# Patient Record
Sex: Male | Born: 1950
Health system: Southern US, Community
[De-identification: ages and names within clinical notes are randomized; demographics above are authoritative.]

## PROBLEM LIST (undated history)

## (undated) DIAGNOSIS — G4730 Sleep apnea, unspecified: Secondary | ICD-10-CM

## (undated) DIAGNOSIS — I219 Acute myocardial infarction, unspecified: Secondary | ICD-10-CM

## (undated) DIAGNOSIS — F419 Anxiety disorder, unspecified: Secondary | ICD-10-CM

## (undated) DIAGNOSIS — I10 Essential (primary) hypertension: Secondary | ICD-10-CM

## (undated) DIAGNOSIS — E785 Hyperlipidemia, unspecified: Secondary | ICD-10-CM

## (undated) DIAGNOSIS — K219 Gastro-esophageal reflux disease without esophagitis: Secondary | ICD-10-CM

## (undated) DIAGNOSIS — Z9289 Personal history of other medical treatment: Secondary | ICD-10-CM

## (undated) DIAGNOSIS — M87059 Idiopathic aseptic necrosis of unspecified femur: Secondary | ICD-10-CM

## (undated) DIAGNOSIS — M1990 Unspecified osteoarthritis, unspecified site: Secondary | ICD-10-CM

## (undated) DIAGNOSIS — E079 Disorder of thyroid, unspecified: Secondary | ICD-10-CM

## (undated) DIAGNOSIS — R202 Paresthesia of skin: Secondary | ICD-10-CM

## (undated) DIAGNOSIS — I2510 Atherosclerotic heart disease of native coronary artery without angina pectoris: Secondary | ICD-10-CM

## (undated) DIAGNOSIS — T7840XA Allergy, unspecified, initial encounter: Secondary | ICD-10-CM

## (undated) HISTORY — PX: TONSILLECTOMY: SUR1361

## (undated) HISTORY — DX: Allergy, unspecified, initial encounter: T78.40XA

## (undated) HISTORY — PX: CARDIAC CATHETERIZATION: SHX172

## (undated) HISTORY — DX: Hyperlipidemia, unspecified: E78.5

## (undated) HISTORY — DX: Paresthesia of skin: R20.2

## (undated) HISTORY — DX: Essential (primary) hypertension: I10

## (undated) HISTORY — DX: Sleep apnea, unspecified: G47.30

## (undated) HISTORY — DX: Atherosclerotic heart disease of native coronary artery without angina pectoris: I25.10

## (undated) HISTORY — PX: COLONOSCOPY: SHX174

## (undated) HISTORY — DX: Idiopathic aseptic necrosis of unspecified femur: M87.059

## (undated) HISTORY — PX: TOTAL HIP ARTHROPLASTY: SHX124

## (undated) HISTORY — PX: UPPER GASTROINTESTINAL ENDOSCOPY: SHX188

## (undated) HISTORY — DX: Anxiety disorder, unspecified: F41.9

## (undated) HISTORY — DX: Personal history of other medical treatment: Z92.89

## (undated) HISTORY — PX: ROTATOR CUFF REPAIR: SHX139

## (undated) HISTORY — DX: Disorder of thyroid, unspecified: E07.9

---

## 2002-01-29 ENCOUNTER — Encounter: Payer: Self-pay | Admitting: Family Medicine

## 2002-01-29 ENCOUNTER — Ambulatory Visit (HOSPITAL_COMMUNITY): Admission: RE | Admit: 2002-01-29 | Discharge: 2002-01-29 | Payer: Self-pay | Admitting: Family Medicine

## 2004-07-27 ENCOUNTER — Ambulatory Visit (HOSPITAL_COMMUNITY): Admission: RE | Admit: 2004-07-27 | Discharge: 2004-07-27 | Payer: Self-pay | Admitting: Family Medicine

## 2004-11-16 ENCOUNTER — Ambulatory Visit (HOSPITAL_BASED_OUTPATIENT_CLINIC_OR_DEPARTMENT_OTHER): Admission: RE | Admit: 2004-11-16 | Discharge: 2004-11-16 | Payer: Self-pay | Admitting: Orthopedic Surgery

## 2004-11-16 ENCOUNTER — Encounter (INDEPENDENT_AMBULATORY_CARE_PROVIDER_SITE_OTHER): Payer: Self-pay | Admitting: Specialist

## 2004-11-16 ENCOUNTER — Ambulatory Visit (HOSPITAL_COMMUNITY): Admission: RE | Admit: 2004-11-16 | Discharge: 2004-11-16 | Payer: Self-pay | Admitting: Orthopedic Surgery

## 2005-03-28 ENCOUNTER — Ambulatory Visit (HOSPITAL_COMMUNITY): Admission: RE | Admit: 2005-03-28 | Discharge: 2005-03-28 | Payer: Self-pay | Admitting: Family Medicine

## 2005-12-10 ENCOUNTER — Ambulatory Visit: Payer: Self-pay | Admitting: Internal Medicine

## 2007-01-14 ENCOUNTER — Emergency Department (HOSPITAL_COMMUNITY): Admission: EM | Admit: 2007-01-14 | Discharge: 2007-01-14 | Payer: Self-pay | Admitting: Emergency Medicine

## 2007-08-19 ENCOUNTER — Inpatient Hospital Stay (HOSPITAL_COMMUNITY): Admission: EM | Admit: 2007-08-19 | Discharge: 2007-08-21 | Payer: Self-pay | Admitting: Emergency Medicine

## 2007-08-19 ENCOUNTER — Ambulatory Visit: Payer: Self-pay | Admitting: Cardiovascular Disease

## 2007-08-25 ENCOUNTER — Ambulatory Visit: Payer: Self-pay | Admitting: Internal Medicine

## 2007-08-28 ENCOUNTER — Inpatient Hospital Stay (HOSPITAL_COMMUNITY): Admission: EM | Admit: 2007-08-28 | Discharge: 2007-09-02 | Payer: Self-pay | Admitting: Emergency Medicine

## 2007-08-28 ENCOUNTER — Ambulatory Visit: Payer: Self-pay | Admitting: Internal Medicine

## 2007-09-05 ENCOUNTER — Ambulatory Visit: Payer: Self-pay | Admitting: Internal Medicine

## 2007-09-08 ENCOUNTER — Ambulatory Visit: Payer: Self-pay | Admitting: Internal Medicine

## 2007-09-22 ENCOUNTER — Ambulatory Visit: Payer: Self-pay | Admitting: Cardiology

## 2007-09-26 ENCOUNTER — Ambulatory Visit: Payer: Self-pay | Admitting: Internal Medicine

## 2007-10-13 ENCOUNTER — Ambulatory Visit: Payer: Self-pay | Admitting: Internal Medicine

## 2007-10-21 ENCOUNTER — Ambulatory Visit: Payer: Self-pay | Admitting: Cardiology

## 2007-10-23 ENCOUNTER — Ambulatory Visit: Payer: Self-pay | Admitting: Internal Medicine

## 2007-10-28 ENCOUNTER — Encounter (HOSPITAL_COMMUNITY): Admission: RE | Admit: 2007-10-28 | Discharge: 2007-11-27 | Payer: Self-pay | Admitting: Internal Medicine

## 2007-10-31 ENCOUNTER — Ambulatory Visit: Payer: Self-pay | Admitting: Internal Medicine

## 2007-11-06 ENCOUNTER — Ambulatory Visit: Payer: Self-pay | Admitting: Cardiology

## 2007-11-12 ENCOUNTER — Ambulatory Visit: Payer: Self-pay | Admitting: Cardiovascular Disease

## 2007-11-14 ENCOUNTER — Ambulatory Visit: Payer: Self-pay | Admitting: Internal Medicine

## 2007-11-28 ENCOUNTER — Encounter (HOSPITAL_COMMUNITY): Admission: RE | Admit: 2007-11-28 | Discharge: 2007-12-28 | Payer: Self-pay | Admitting: Internal Medicine

## 2007-12-01 ENCOUNTER — Ambulatory Visit: Payer: Self-pay | Admitting: Internal Medicine

## 2007-12-17 ENCOUNTER — Ambulatory Visit: Payer: Self-pay | Admitting: Cardiology

## 2007-12-22 ENCOUNTER — Ambulatory Visit: Payer: Self-pay | Admitting: Internal Medicine

## 2007-12-29 ENCOUNTER — Encounter (HOSPITAL_COMMUNITY): Admission: RE | Admit: 2007-12-29 | Discharge: 2007-12-31 | Payer: Self-pay | Admitting: Internal Medicine

## 2007-12-30 ENCOUNTER — Inpatient Hospital Stay (HOSPITAL_BASED_OUTPATIENT_CLINIC_OR_DEPARTMENT_OTHER): Admission: RE | Admit: 2007-12-30 | Discharge: 2007-12-30 | Payer: Self-pay | Admitting: Cardiology

## 2007-12-30 ENCOUNTER — Ambulatory Visit: Payer: Self-pay | Admitting: Cardiology

## 2008-01-08 ENCOUNTER — Ambulatory Visit: Payer: Self-pay | Admitting: Cardiology

## 2008-01-09 ENCOUNTER — Emergency Department (HOSPITAL_COMMUNITY): Admission: EM | Admit: 2008-01-09 | Discharge: 2008-01-09 | Payer: Self-pay | Admitting: Emergency Medicine

## 2008-01-09 ENCOUNTER — Ambulatory Visit: Payer: Self-pay | Admitting: Internal Medicine

## 2008-01-09 DIAGNOSIS — L270 Generalized skin eruption due to drugs and medicaments taken internally: Secondary | ICD-10-CM | POA: Insufficient documentation

## 2008-01-09 DIAGNOSIS — R229 Localized swelling, mass and lump, unspecified: Secondary | ICD-10-CM

## 2008-01-12 ENCOUNTER — Encounter: Payer: Self-pay | Admitting: Internal Medicine

## 2008-01-27 ENCOUNTER — Inpatient Hospital Stay (HOSPITAL_COMMUNITY): Admission: AD | Admit: 2008-01-27 | Discharge: 2008-01-28 | Payer: Self-pay | Admitting: Cardiology

## 2008-01-27 ENCOUNTER — Ambulatory Visit: Payer: Self-pay | Admitting: Cardiology

## 2008-02-03 ENCOUNTER — Emergency Department (HOSPITAL_COMMUNITY): Admission: EM | Admit: 2008-02-03 | Discharge: 2008-02-03 | Payer: Self-pay | Admitting: Emergency Medicine

## 2008-02-09 ENCOUNTER — Ambulatory Visit: Payer: Self-pay | Admitting: Internal Medicine

## 2008-02-11 ENCOUNTER — Encounter: Payer: Self-pay | Admitting: Internal Medicine

## 2008-05-07 ENCOUNTER — Ambulatory Visit: Payer: Self-pay | Admitting: Internal Medicine

## 2008-06-24 ENCOUNTER — Ambulatory Visit: Payer: Self-pay | Admitting: Internal Medicine

## 2008-07-13 ENCOUNTER — Encounter: Payer: Self-pay | Admitting: Internal Medicine

## 2008-07-13 ENCOUNTER — Emergency Department (HOSPITAL_COMMUNITY): Admission: EM | Admit: 2008-07-13 | Discharge: 2008-07-13 | Payer: Self-pay | Admitting: Emergency Medicine

## 2008-08-26 ENCOUNTER — Ambulatory Visit: Payer: Self-pay | Admitting: Cardiology

## 2008-11-17 ENCOUNTER — Ambulatory Visit: Payer: Self-pay | Admitting: Cardiology

## 2008-11-17 ENCOUNTER — Encounter: Payer: Self-pay | Admitting: Physician Assistant

## 2008-11-17 ENCOUNTER — Encounter: Payer: Self-pay | Admitting: Internal Medicine

## 2008-11-24 ENCOUNTER — Ambulatory Visit: Payer: Self-pay | Admitting: Cardiology

## 2009-01-14 ENCOUNTER — Encounter: Payer: Self-pay | Admitting: Cardiology

## 2009-02-14 ENCOUNTER — Ambulatory Visit: Payer: Self-pay | Admitting: Cardiology

## 2009-02-21 ENCOUNTER — Ambulatory Visit (HOSPITAL_COMMUNITY): Admission: RE | Admit: 2009-02-21 | Discharge: 2009-02-21 | Payer: Self-pay | Admitting: Cardiology

## 2009-04-22 ENCOUNTER — Encounter: Payer: Self-pay | Admitting: Cardiology

## 2009-05-31 ENCOUNTER — Telehealth: Payer: Self-pay | Admitting: Cardiology

## 2009-06-11 DIAGNOSIS — E039 Hypothyroidism, unspecified: Secondary | ICD-10-CM | POA: Insufficient documentation

## 2009-06-15 ENCOUNTER — Encounter: Payer: Self-pay | Admitting: Cardiology

## 2009-07-05 ENCOUNTER — Encounter (INDEPENDENT_AMBULATORY_CARE_PROVIDER_SITE_OTHER): Payer: Self-pay | Admitting: *Deleted

## 2009-08-12 ENCOUNTER — Ambulatory Visit: Payer: Self-pay | Admitting: Internal Medicine

## 2009-08-12 DIAGNOSIS — I2510 Atherosclerotic heart disease of native coronary artery without angina pectoris: Secondary | ICD-10-CM

## 2009-08-12 DIAGNOSIS — I10 Essential (primary) hypertension: Secondary | ICD-10-CM

## 2009-08-12 DIAGNOSIS — E785 Hyperlipidemia, unspecified: Secondary | ICD-10-CM | POA: Insufficient documentation

## 2009-08-15 ENCOUNTER — Telehealth (INDEPENDENT_AMBULATORY_CARE_PROVIDER_SITE_OTHER): Payer: Self-pay | Admitting: *Deleted

## 2009-08-15 ENCOUNTER — Encounter: Payer: Self-pay | Admitting: Cardiology

## 2009-08-15 LAB — CONVERTED CEMR LAB
Cholesterol: 142 mg/dL
LDL (calc): 87 mg/dL

## 2009-10-26 ENCOUNTER — Encounter: Payer: Self-pay | Admitting: Internal Medicine

## 2010-02-07 ENCOUNTER — Telehealth: Payer: Self-pay | Admitting: Internal Medicine

## 2010-02-20 ENCOUNTER — Ambulatory Visit: Payer: Self-pay | Admitting: Internal Medicine

## 2010-04-20 ENCOUNTER — Telehealth: Payer: Self-pay | Admitting: Internal Medicine

## 2010-10-23 ENCOUNTER — Telehealth: Payer: Self-pay | Admitting: Internal Medicine

## 2010-12-12 ENCOUNTER — Encounter: Payer: Self-pay | Admitting: Internal Medicine

## 2010-12-18 ENCOUNTER — Ambulatory Visit: Payer: Self-pay | Admitting: Internal Medicine

## 2010-12-18 ENCOUNTER — Encounter: Payer: Self-pay | Admitting: Internal Medicine

## 2010-12-21 ENCOUNTER — Encounter: Payer: Self-pay | Admitting: Internal Medicine

## 2010-12-21 LAB — CONVERTED CEMR LAB
BUN: 13 mg/dL (ref 6–23)
Cholesterol: 147 mg/dL (ref 0–200)
Potassium: 4.7 meq/L (ref 3.5–5.3)
Triglycerides: 62 mg/dL (ref <150)
VLDL: 12 mg/dL (ref 0–40)

## 2011-01-28 LAB — CONVERTED CEMR LAB
LDL Cholesterol: 72 mg/dL (ref 0–99)
Triglycerides: 129 mg/dL (ref <150)
VLDL: 26 mg/dL (ref 0–40)

## 2011-01-30 NOTE — Progress Notes (Signed)
Summary: dose pt need labwork   Phone Note Call from Patient Call back at Home Phone [phone]   Caller: Patient Reason for Call: Talk to Nurse, Talk to Doctor Summary of Call: dose pt need lab work prior to appt Initial call taken by: Omer Jack,  October 23, 2010 11:52 AM  Follow-up for Phone Call        Called patient,  advised him that he does need fasting labs prior to appointment with Dr.Ross since it has been 1 year since completed. Will mail order for labs so he can get this done at [facility] in [place].  Layne Benton, RN, BSN  October 25, 2010 7:16 PM

## 2011-01-30 NOTE — Progress Notes (Signed)
Summary: pt need to stop asa - prior to colonscopy  Phone Note Call from Patient Call back at Home Phone [phone]   Caller: Patient Reason for Call: Talk to Nurse Summary of Call: pt on 325 mg of asa. having colonscopy on 4/26. instruction advising pt to stop.  Initial call taken by: Lorne Skeens,  April 20, 2010 12:37 PM  Follow-up for Phone Call        [facility] for call back. Follow-up by: Suzan Garibaldi RN  Additional Follow-up for Phone Call Additional follow up Details #1::        Called patient back ...advised OK to hold ASA prior to colonoscopy and restart the day after. Additional Follow-up by: Suzan Garibaldi RN

## 2011-01-30 NOTE — Assessment & Plan Note (Signed)
Summary: 6-8 month   Visit Type:  Follow-up Referring Provider:  Juanda Chance Primary Provider:  Reatha Armour  CC:  Pt had eposide of chest pain and heart was racing.  History of Present Illness: Patient is a 60 year old with a history of CAD (s/p NSTEMI in 8/08; DESx2 to the D2; DES to the LAD).  He was last in AUgust 2010. Since seen, he has had one episode of chest pain  a few wks ago after fajitas.  Pain was in mid chst to back.  He says he has it every once in awhile.   He denies chst pain with exertion.  He remains active.  Current Medications (verified): 1)  Metoprolol Succinate 25 Mg Xr24h-Tab (Metoprolol Succinate) .... Take One Tablet By Mouth Daily 2)  Xanax 0.5 Mg  Tabs (Alprazolam) .... As Needed 3)  Bayer Aspirin 325 Mg  Tabs (Aspirin) .Marland Kitchen.. 1 By Mouth Daily 4)  Hyzaar 100-25 Mg Tabs (Losartan Potassium-Hctz) .... Take 1 Tablet By Mouth Once A Day 5)  Lipitor 80 Mg Tabs (Atorvastatin Calcium) .Marland Kitchen.. 1 Tablet Before Bedtime 6)  Nitrostat 0.4 Mg Subl (Nitroglycerin) .Marland Kitchen.. 1 Tablet Under Tongue At Onset of Chest Pain; You May Repeat Every 5 Minutes For Up To 3 Doses. 7)  Famotidine 40 Mg Tabs (Famotidine) .Marland Kitchen.. 1 Tab At Bedtime  Allergies (verified): 1)  ! Plavix (Clopidogrel Bisulfate) 2)  ! Pcn 3)  ! * Bee Stings  Past History:  Past Medical History: Last updated: 03/08/2009 CAD HTN Hyperlidemia avascular necrosis R hip ORIF R hip 06/1999 Anxiety Paresthesias  Past Surgical History: Last updated: 03/08/2009 tonsillectomy at age 85 right hip replacement for avascular  necrosis at age 54 of his left hip spine  Social History: Last updated: 03/08/2009 Patient states former smoker.  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 EOMI, PERRLA.  Neck: JVP is normal. No thyromegaly. No bruits.  Lungs: clear to auscultation. No rales no wheezes.  Heart: Regular rate and rhythm. Normal S1, S2. No S3.   No significant murmurs. PMI not displaced.  Abdomen:  Supple, nontender. Normal bowel sounds. No masses. No hepatomegaly.  Extremities:   Good distal pulses throughout. No lower extremity edema.  Musculoskeletal :moving all extremities.  Neuro:   alert and oriented x3.    EKG  Procedure date:  02/20/2010  Findings:      NSR> 67 bpm.  Impression & Recommendations:  Problem # 1:  CAD, NATIVE VESSEL (ICD-414.01) COnitnue on current regimen.  Problem # 2:  ESSENTIAL HYPERTENSION, BENIGN (ICD-401.1) Adequate BP control His updated medication list for this problem includes:    Metoprolol Succinate 25 Mg Xr24h-tab (Metoprolol succinate) .Marland Kitchen... Take one tablet by mouth daily    Bayer Aspirin 325 Mg Tabs (Aspirin) .Marland Kitchen... 1 by mouth daily    Hyzaar 100-25 Mg Tabs (Losartan potassium-hctz) .Marland Kitchen... Take 1 tablet by mouth once a day  Problem # 3:  HYPERLIPIDEMIA-MIXED (ICD-272.4) LDL was 72; HDL was 34.  Did not tolerate Crestor which I had  recommended in past. His updated medication list for this problem includes:    Lipitor 80 Mg Tabs (Atorvastatin calcium) .Marland Kitchen... 1 tablet before bedtime  Other Orders: EKG w/ Interpretation (93000)  Patient Instructions: 1)  Your physician recommends that you return for lab work in: lab work today...we will call you with results 2)  Your physician recommends that you schedule a follow-up appointment in: 10 months Prescriptions: NITROSTAT 0.4 MG SUBL (NITROGLYCERIN) 1 tablet under tongue at onset of chest pain; you may repeat every 5 minutes for up to 3 doses.  #25 x 2   Entered by:   Layne Benton, RN, BSN   Authorized by:   Sherrill Raring, MD,  [facility]   Signed by:   Layne Benton, RN, BSN on 02/20/2010   Method used:   Electronically to        [facility]* (retail)       [address]       [place], [place]  [address]       Ph: [phone]       Fax: [phone]   RxID:   [id number] FAMOTIDINE 40 MG TABS (FAMOTIDINE) 1 tab at bedtime  #90 x 3   Entered by:   Layne Benton, RN, BSN   Authorized by:   Sherrill Raring, MD, [facility]   Signed by:   Layne Benton, RN, BSN on 02/20/2010   Method used:   Electronically to        [facility]* (mail-order)             ,          Ph: [phone]       Fax: [phone]   RxID:   [id number] LIPITOR 80 MG TABS (ATORVASTATIN CALCIUM) 1 tablet before bedtime  #90 x 3   Entered by:   Layne Benton, RN, BSN   Authorized by:   Sherrill Raring, MD, [facility]   Signed by:   Layne Benton, RN, BSN on 02/20/2010   Method used:   Electronically to        [facility]* (mail-order)             ,          Ph: [phone]       Fax: [phone]   RxID:   [id number] HYZAAR 100-25 MG TABS (LOSARTAN POTASSIUM-HCTZ) Take 1 tablet by mouth once a day  #90 x 3   Entered by:   Layne Benton, RN, BSN   Authorized by:   Sherrill Raring, MD, [facility]   Signed by:   Layne Benton, RN, BSN on 02/20/2010   Method used:   Electronically to        [facility]* (mail-order)             ,          Ph: [phone]       Fax: [phone]   RxID:   [id number] METOPROLOL SUCCINATE 25 MG XR24H-TAB (METOPROLOL SUCCINATE) Take one tablet by mouth daily  #90 x 3   Entered by:   Layne Benton, RN, BSN   Authorized by:   Sherrill Raring, MD, [facility]   Signed by:   Layne Benton, RN, BSN on 02/20/2010   Method used:   Electronically to        [facility]* (mail-order)             ,  Ph: [phone]       Fax: [phone]   RxID:   [id number]

## 2011-01-30 NOTE — Progress Notes (Signed)
Summary: QUESTIONS ABOUT HEARTBURN,MEDICATIONS  Phone Note Call from Patient Call back at Home Phone [phone]   Caller: Patient Summary of Call: PT HAVE QUESTION ABOUT  MEDICATIONS,AND HEARTBURN Initial call taken by: Judie Grieve,  February 07, 2010 10:10 AM  Follow-up for Phone Call        Called patient back...he states that a few days ago he ate a spicy [place] style meal with hot peppers and then had heartburn so he restarted OTC Pepcid and felt better. He wanted Dr.Leontyne Manville to know...advised will tell her. He has a follow up appointment in a few weeks with Dr.Dejai Schubach. Follow-up by: J REISS RN     Appended Document: QUESTIONS ABOUT HEARTBURN,MEDICATIONS OK

## 2011-02-01 NOTE — Assessment & Plan Note (Signed)
Summary: 10 month rov/sl   Visit Type:  10 month follow up Referring Provider:  Juanda Chance Primary Provider:  Reatha Armour  CC:  no complaints.  History of Present Illness: Patient is a 60 year old with a history of CAD (s/p NSTEMI in 8/08; DESx2 to the D2; DES to the LAD).  He also has a history of dyslpidemia and a Plavix allergy.    He was last in February 2011. SInce seen he has done well   he denies chest pain.  NO SOB.  No palpitations.  He stays active. Did have some cramping in legs at night.  Backed down on Lipitor to 40.  Helped some. Will also try CoQ10  Note that Crestor gave him trigger finger.  Current Medications (verified): 1)  Xanax 0.5 Mg  Tabs (Alprazolam) .... As Needed 2)  Bayer Aspirin 325 Mg  Tabs (Aspirin) .Marland Kitchen.. 1 By Mouth Daily 3)  Hyzaar 100-25 Mg Tabs (Losartan Potassium-Hctz) .... Take 1 Tablet By Mouth Once A Day 4)  Lipitor 80 Mg Tabs (Atorvastatin Calcium) .Marland Kitchen.. 1 Tablet Before Bedtime / Pt Has Been Taking 1/2 Tablet 5)  Nitrostat 0.4 Mg Subl (Nitroglycerin) .Marland Kitchen.. 1 Tablet Under Tongue At Onset of Chest Pain; You May Repeat Every 5 Minutes For Up To 3 Doses. 6)  Tums 500 Mg Chew (Calcium Carbonate Antacid) .... As Needed  Allergies (verified): 1)  ! Plavix (Clopidogrel Bisulfate) 2)  ! Pcn 3)  ! * Bee Stings  Past History:  Past medical, surgical, family and social histories (including risk factors) reviewed, and no changes noted (except as noted below).  Past Medical History: Reviewed history from 03/08/2009 and no changes required. CAD HTN Hyperlidemia avascular necrosis R hip ORIF R hip 06/1999 Anxiety Paresthesias  Past Surgical History: Reviewed history from 03/08/2009 and no changes required. tonsillectomy at age 63 right hip replacement for avascular  necrosis at age 83 of his left hip spine  Family History: Reviewed history from 03/08/2009 and no changes required. Non atopic family   Mother is alive with dementia, lives in a nursing  home.   Father is deceased with prostate cancer at age 68.  One sister died of   lung cancer in her 22s; other siblings are healthy.  The patient has 2   children who are healthy.  Social History: Reviewed history from 03/08/2009 and no changes required. Patient states former smoker.  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  Neg except as noted above.  Vital Signs:  Patient profile:   60 year old male Height:      72 inches Weight:      185.25 pounds BMI:     25.22 Pulse rate:   78 / minute BP sitting:   126 / 88  (left arm) Cuff size:   regular  Vitals Entered By: Caralee Ates CMA (December 18, 2010 1:57 PM)  Physical Exam  Additional Exam:  Patient is in NAD HEENT:  Normocephalic, atraumatic. EOMI, PERRLA.  Neck: JVP is normal. No thyromegaly. No bruits.  Lungs: clear to auscultation. No rales no wheezes.  Heart: Regular rate and rhythm. Normal S1, S2. No S3.   No significant murmurs. PMI not displaced.  Abdomen:  Supple, nontender. Normal bowel sounds. No masses. No hepatomegaly.  Extremities:   Good distal pulses throughout. No lower extremity edema.  Musculoskeletal :moving all extremities.  Neuro:   alert and oriented x3.  Impression & Recommendations:  Problem # 1:  CAD, NATIVE VESSEL (ICD-414.01) No symptoms to suggest ischemia.  Problem # 2:  ESSENTIAL HYPERTENSION, BENIGN (ICD-401.1) kEEP ON current regimen.  Bp runs better per his report.  Problem # 3:  HYPERLIPIDEMIA-MIXED (ICD-272.4) Will add Zetia and check in 7 wks.   His updated medication list for this problem includes:    Lipitor 80 Mg Tabs (Atorvastatin calcium) .Marland Kitchen... 1 tablet before bedtime / pt has been taking 1/2 tablet  Other Orders: EKG w/ Interpretation (93000)  CHF Assessment/Plan:      The patient's current weight is 185.25 pounds.  His previous weight was 186 pounds.     Patient Instructions: 1)  Add Zetia to meds  10  mg.  COntinue lipitor. 2)  Get labs checked in [place] (fasting) in 7 weeks.  Will call once we get results. 3)  F/U in 1 year.  Appended Document: 10 month rov/sl 12 lead EKG:  NSR  69 bpm.

## 2011-02-01 NOTE — Miscellaneous (Signed)
  Clinical Lists Changes  Medications: Added new medication of ZETIA 10 MG TABS (EZETIMIBE) 1 tab every day

## 2011-02-19 ENCOUNTER — Telehealth: Payer: Self-pay | Admitting: Internal Medicine

## 2011-02-27 NOTE — Progress Notes (Signed)
Summary: question on meds and refills  Phone Note Call from Patient Call back at Home Phone [phone] Message from:  Patient on February 19, 2011 11:48 AM  Refills Requested: Medication #1:  LIPITOR 80 MG TABS 1 tablet before bedtime / pt has been taking 1/2 tablet pt wants to know if he can get liptior 40mg  so he does not have to cut the pill in half. please [facility] this [facility] to [facility].  Summary of Call: pt wants to know if he still needs to get his blood work done since he not taking zetia anymore. Initial call taken by: Roe Coombs,  February 19, 2011 11:51 AM  Follow-up for Phone Call        Called patient back. He states that he stopped Zetia because it was causing dry mouth, nausea, and indigestion. He will skip doing repeat labs since he stopped the Zetia.  He is requesting that we send in a new script for Lipitor 40mg  because he does no want to split the tablet.  Layne Benton, RN, BSN  February 19, 2011 3:01 PM      New/Updated Medications: ATORVASTATIN CALCIUM 40 MG TABS (ATORVASTATIN CALCIUM) 1 tab at bedtime Prescriptions: ATORVASTATIN CALCIUM 40 MG TABS (ATORVASTATIN CALCIUM) 1 tab at bedtime  #90 x 3   Entered by:   Layne Benton, RN, BSN   Authorized by:   Sherrill Raring, MD, [facility]   Signed by:   Layne Benton, RN, BSN on 02/19/2011   Method used:   Electronically to        [facility]* (mail-order)       [address] [place], [place]  [address]       Ph: [phone]       Fax: [phone]   RxID:   [id number]

## 2011-03-10 ENCOUNTER — Emergency Department (HOSPITAL_COMMUNITY)
Admission: EM | Admit: 2011-03-10 | Discharge: 2011-03-11 | Disposition: A | Discharge status: Home / Self Care | Payer: Federal, State, Local not specified - PPO | Attending: Emergency Medicine | Admitting: Emergency Medicine

## 2011-03-10 ENCOUNTER — Emergency Department (HOSPITAL_COMMUNITY): Payer: Federal, State, Local not specified - PPO

## 2011-03-10 DIAGNOSIS — I2510 Atherosclerotic heart disease of native coronary artery without angina pectoris: Secondary | ICD-10-CM | POA: Insufficient documentation

## 2011-03-10 DIAGNOSIS — E876 Hypokalemia: Secondary | ICD-10-CM | POA: Insufficient documentation

## 2011-03-10 DIAGNOSIS — R079 Chest pain, unspecified: Secondary | ICD-10-CM | POA: Insufficient documentation

## 2011-03-10 DIAGNOSIS — K209 Esophagitis, unspecified without bleeding: Secondary | ICD-10-CM | POA: Insufficient documentation

## 2011-03-10 LAB — CBC
HCT: 41.8 % (ref 39.0–52.0)
Hemoglobin: 14.1 g/dL (ref 13.0–17.0)
MCH: 30.7 pg (ref 26.0–34.0)
MCHC: 33.7 g/dL (ref 30.0–36.0)

## 2011-03-10 LAB — DIFFERENTIAL
Basophils Relative: 0 % (ref 0–1)
Lymphocytes Relative: 36 % (ref 12–46)
Monocytes Absolute: 0.7 10*3/uL (ref 0.1–1.0)
Monocytes Relative: 7 % (ref 3–12)
Neutro Abs: 5.3 10*3/uL (ref 1.7–7.7)

## 2011-03-11 LAB — URINALYSIS, ROUTINE W REFLEX MICROSCOPIC
Glucose, UA: NEGATIVE mg/dL
Hgb urine dipstick: NEGATIVE
Specific Gravity, Urine: 1.01 (ref 1.005–1.030)
Urobilinogen, UA: 0.2 mg/dL (ref 0.0–1.0)

## 2011-03-11 LAB — BASIC METABOLIC PANEL
BUN: 16 mg/dL (ref 6–23)
CO2: 26 mEq/L (ref 19–32)
Calcium: 9 mg/dL (ref 8.4–10.5)
Creatinine, Ser: 1.07 mg/dL (ref 0.4–1.5)
Glucose, Bld: 174 mg/dL — ABNORMAL HIGH (ref 70–99)

## 2011-03-21 ENCOUNTER — Other Ambulatory Visit: Payer: Self-pay | Admitting: *Deleted

## 2011-03-21 DIAGNOSIS — I10 Essential (primary) hypertension: Secondary | ICD-10-CM

## 2011-03-21 MED ORDER — LOSARTAN POTASSIUM-HCTZ 100-25 MG PO TABS: 1.0000 | ORAL_TABLET | Freq: Every day | ORAL | Status: DC

## 2011-03-22 ENCOUNTER — Other Ambulatory Visit: Payer: Self-pay

## 2011-03-22 ENCOUNTER — Ambulatory Visit: Payer: Federal, State, Local not specified - PPO | Admitting: Gastroenterology

## 2011-03-23 ENCOUNTER — Other Ambulatory Visit: Payer: Self-pay | Admitting: *Deleted

## 2011-03-23 DIAGNOSIS — I10 Essential (primary) hypertension: Secondary | ICD-10-CM

## 2011-03-27 ENCOUNTER — Encounter (INDEPENDENT_AMBULATORY_CARE_PROVIDER_SITE_OTHER): Payer: Self-pay | Admitting: *Deleted

## 2011-04-03 NOTE — Letter (Signed)
Summary: Scheduled Appointment  [facility]  [address]   [place], [place] [address]   Phone: [phone]  Fax: [phone]    March 27, 2011   Dear: [name] [name]            DOB: 01/12/1951    I have been instructed to schedule you an appointment in our office.  Your appointment is as follows:   Date:                April 25, 2011     Time:               11AM    Please be here 15 minutes early.   Provider:            DR FIELDS  (PLEASE DISREGARD APPOINTMENT FOR 04/18/11 @ 11AM. Ardeen Fillers WILL BE OUT OF OFFICE THAT DAY)  Please contact the office if you need to reschedule this appointment for a more convenient time.   Thank you,    Diana Eves       [facility] Ph: [phone]   Fax: [phone]

## 2011-04-18 ENCOUNTER — Ambulatory Visit: Payer: Federal, State, Local not specified - PPO | Admitting: Gastroenterology

## 2011-04-23 ENCOUNTER — Telehealth: Payer: Self-pay | Admitting: Internal Medicine

## 2011-04-23 NOTE — Telephone Encounter (Signed)
Pt would like a 90 day supply of Hyzaar 100/25 mg called into [facility]. [phone]

## 2011-04-25 ENCOUNTER — Ambulatory Visit: Payer: Federal, State, Local not specified - PPO | Admitting: Gastroenterology

## 2011-05-01 ENCOUNTER — Telehealth: Payer: Self-pay | Admitting: Internal Medicine

## 2011-05-01 NOTE — Telephone Encounter (Signed)
Pt needs losartin changed to a 90day supply and he uses [facility]

## 2011-05-02 ENCOUNTER — Other Ambulatory Visit: Payer: Self-pay | Admitting: *Deleted

## 2011-05-02 DIAGNOSIS — I10 Essential (primary) hypertension: Secondary | ICD-10-CM

## 2011-05-02 MED ORDER — LOSARTAN POTASSIUM-HCTZ 100-25 MG PO TABS: 1.0000 | ORAL_TABLET | Freq: Every day | ORAL | Status: DC

## 2011-05-02 NOTE — Telephone Encounter (Signed)
Medication request faxed to [facility].

## 2011-05-15 NOTE — Assessment & Plan Note (Signed)
[facility]                            CARDIOLOGY OFFICE NOTE   [name] [name], [name] [name]                     MRN:          [id number]  DATE:12/17/2007                            DOB:          May 15, 1951    PRIMARY CARE PHYSICIAN:  Corrie Mckusick, M.D.   CARDIOLOGIST:  Pricilla Riffle, MD, [facility].   CLINICAL HISTORY:  The patient returned for a follow-up consultation  regarding management of his coronary heart disease.  He was hospitalized  at [facility] in August with a non-ST myocardial infarction and  underwent a complex PCI procedure of a bifurcation lesion in the LAD.  He had a reversed crush procedure with two stents placed in the diagonal  branch and a stent placed in the LAD.  This was not the original intent  of the procedure and we hoped that we got by with the stent in the  diagonal branch away from the ostium and a stent in the LAD, but he  developed a dissection with the diagonal stenting and had to have an  extra stent placed.  He has done well since that time, but he developed  a profound rash to Plavix and this had to be stopped.  He was put on  Ticlid, but then he was hospitalized at [facility] a little over  a month ago with a rash and swelling from Ticlid and this had to be  stopped.  He has been on subcu Lovenox twice a day since that time.  He  did have some chest pain when he was hospitalized at [facility], but he  has not had much chest pain since that time.  He does get occasional  twinges of chest pain and some symptoms that he attributes to  indigestion.  He has had no exertional chest pain.   He has been seen by Jessica Priest, M.D., an allergist, for possible  desensitization, but Dr. Lucie Leather has recommended referral to a medical  center if this is to be done.   PAST MEDICAL HISTORY:  Significant for reflux and hyperlipidemia.   SOCIAL HISTORY:  He works for the [employer], but has not worked since  his non-ST  elevation myocardial infarction.  He has quite a bit of sick  leave saved up.   CURRENT MEDICATIONS:  1. Famotidine.  2. Aspirin.  3. Toprol.  4. Lipitor.  5. Lovenox injections.  He takes his morning injection at 10 a.m.   PHYSICAL EXAMINATION:  VITAL SIGNS:  131/86, pulse 62 and regular.  There was no venous distention.  NECK:  Carotid pulses were full without bruits.  CHEST:  Clear.  HEART:  Rhythm was regular.  I could hear no murmurs or gallops.  ABDOMEN:  Soft with normal bowel sounds.  No hepatosplenomegaly.  Peripheral pulses are full and there is no peripheral edema.   IMPRESSION:  1. Coronary artery disease status post complex bifurcation      intervention with drug-eluting stents, August 19, 2007.  2. Allergy to Plavix and Ticlid, currently on Lovenox injections.   RECOMMENDATIONS:  I reviewed the  angiograms that we did last August.  In  addition to his bifurcation lesion, the patient does have a lesion  involving the trifurcation of the left main, LAD, and circumflex that is  a little bit worrisome.  I think his situation with subcu Lovenox is not  a therapy that is sustainable and the option of stopping the Lovenox is  not very attractive since I think his risk of stent thrombosis would be  quite high.  I think the best option would be to reevaluate him with  coronary angiography and make a decision whether surgical  revascularization should be considered.  This would depend on the  appearance of his bifurcation stenting as well as his left main and  circumflex disease.  If we feel the anatomy is concerning enough then  bypass surgery may be the best option.  The alternative explanation  would be to refer him to a medical center for desensitization with  Plavix.  I discussed these options with the patient and he is agreeable.  We plan to have him come in a week after Christmas for angiography in  the [facility] lab.     Bruce Elvera Lennox Juanda Chance, MD, [facility]  Electronically  Signed    BRB/MedQ  DD: 12/17/2007  DT: 12/17/2007  Job #: [id number]   cc:   Corrie Mckusick, M.D.  Pricilla Riffle, MD, [facility]

## 2011-05-15 NOTE — Discharge Summary (Signed)
NAME:  [name] [name], [name] [name] NO.:  [id number]   MEDICAL RECORD NO.:  [id number]          PATIENT TYPE:  INP   LOCATION:  2907                         FACILITY:  [facility]   PHYSICIAN:  Luis Abed, MD, FACCDATE OF BIRTH:  [birth year]   DATE OF ADMISSION:  08/18/2007  DATE OF DISCHARGE:  08/21/2007                               DISCHARGE SUMMARY   PRIMARY CARDIOLOGIST:  Dr. Westville Bing.   PRIMARY CARE PHYSICIAN:  Dr. Phillips Odor in [place].   DISCHARGE DIAGNOSIS:  Non-ST elevation myocardial infarction.   SECONDARY DIAGNOSES:  1. Coronary artery disease, status post percutaneous coronary      intervention and stenting of the left anterior descending and      second diagonal with placement of 3 PROMUS drug-eluting stents.  2. Hypertension.  3. Hyperlipidemia.  4. Ongoing tobacco abuse.  5. History of recurrent bronchitis.  6. History of avascular necrosis, status post right hip replacement      approximately 7 years ago.   ALLERGIES:  1. PENICILLIN.  2. HE IS ALSO INTOLERANT TO PRAVACHOL, LESCOL AND ZETIA, WHICH CAUSE      MUSCLE ACHES AND FATIGUE.   PROCEDURE:  Left heart catheterization with successful PCI and stenting  of the bifurcation stenoses of the LAD and second diagonal.   HISTORY OF PRESENT ILLNESS:  A 60 year old Caucasian male who was in his  usual state of health until the evening of August 17, 2007 when he had  an episode of exertional chest discomfort, relieved with rest.  He had  recurrent symptoms on August 18, associated with mild nausea,  lightheadedness and dizziness, prompting him to present to the [facility] ED for further evaluation.  In the ED, his troponin was elevated at  0.9 and then 0.48.  An ECG showed mild inferolateral ST segment  depression of less than 1 mm.  He was admitted for further evaluation.   HOSPITAL COURSE:  Following evaluation, the patient eventually peaked  his CK at 102 with an MB of 8.9 and troponin-I of  2.09.  He continued to  have ongoing discomfort and he was, therefore, taken to the cardiac  catheterization lab urgently on August 19.  Cardiac catheterization  revealed an 80% stenosis in the proximal LAD, extending passed the  second diagonal.  There was also 95% stenosis within the second  diagonal.  He, otherwise, had nonobstructive disease with an EF of 60%.  The LAD and second diagonal were successfully stented with 3 PROMUS drug-  eluting stents.  Patient tolerated this procedure well and post  procedure was monitored in the coronary intensive care unit.  He has  been ambulating without recurrent discomfort and has been counseled on  the importance of smoking cessation, as well as cardiac rehab.  He says  he has now quit smoking and has requested Chantix therapy.   The patient reports intolerance to multiple statins in the past, but  does not believe that he has tried Lipitor.  We have prescribed this  during his hospitalization and he has tolerated up to this point.  He  will be discharged  on Lipitor, which he agrees to take.  He is advised  to inform us of any recurrence of myalgias or fatigue, which he has  previously associated with statin therapy.  We will, otherwise, followup  LFTs and lipids in approximately 6 weeks.   DISCHARGE LAB:  Hemoglobin 12.6, hematocrit 37.2, WBC 10.5, platelets  221, MCV 93.4.  Sodium 138, potassium 4.2, chloride 105, CO2 29, BUN 7,  creatinine 0.83, glucose 99.  PT 14.1, INR 1.1.  Total bilirubin 0.8,  alkaline phosphatase 66, AST 21, ALT less than 8, albumin 3.1.  CK 71,  MB 5.0, troponin-I 0.63.  Calcium  8.7, magnesium 2.1. TSH 3.668.  D-  dimer less than 0.22.   DISPOSITION:  The patient is being discharged home today in good  condition.   FOLLOWUP PLANS AND APPOINTMENTS:  He has follow up with Dr. Dietrich Pates on  September 10 at 9:15 a.m.  He is asked to follow up with Dr. Phillips Odor as  previously scheduled.   DISCHARGE MEDICATIONS:  1.  Aspirin 325 mg daily.  2. Plavix 75 mg daily.  3. Toprol-XL 50 mg daily.  4. Lipitor 80 mg q.h.s.  5. Nitroglycerin 0.4 sublingual p.r.n. chest pain.  6. Chantix as directed.   OUTSTANDING LAB STUDIES:  None.   DURATION DISCHARGE ENCOUNTER:  60 minutes, including physician's time.      Nicolasa Ducking, ANP      Luis Abed, MD, [facility]  Electronically Signed    CB/MEDQ  D:  08/21/2007  T:  08/21/2007  Job:  [id number]   cc:   Phillips Odor, M.D.

## 2011-05-15 NOTE — Assessment & Plan Note (Signed)
[facility]                       [place] CARDIOLOGY OFFICE NOTE   Jean, Skow NORVILLE DANI                     MRN:          [id number]  DATE:12/22/2007                            DOB:          Aug 01, 1951    Mr. Rafter is a 60 year old gentleman whom I have followed in the  cardiology clinic.  I last saw him in clinic here on December 1, he was  just seen by Dr. Charlies Constable in our [facility] office last week on  the 17th.  Plan was to set him up for cardiac catheterization.   The patient was in cardiac rehab today and he was complaining of  itching.  His blood pressure was also noted to be high and they did not  let him exercise.  He asked if he could come down to get seen.   On talking to the patient, he says his skin is itching more now around  his neck.  His upper chest just feels cold.  He also has some  discomfort or sensitive areas on the anterior thighs.  He actually  thinks it might be due to his Lovenox and he has cut back on the Lovenox  once per day.  He denies chest pain.   He does note that the hydroxyzine makes him sleepy.   CURRENT MEDICATIONS:  1. Pepcid 20 mg daily.  2. Aspirin 325 b.i.d.  3. Toprol XL 25 b.i.d.  4. Lipitor 40 nightly.  5. Lovenox daily now.   PHYSICAL EXAM:  The patient is in no distress.  LUNGS:  Clear without wheezes.  CARDIAC:  Regular rate and rhythm.  S1, S2.  No S3.  No murmurs.  ABDOMEN:  Benign.  EXTREMITIES:  No edema.  SKIN:  Neck area is red.  It looks like he has been scratching, though  he denies.  One welt?  CHEST:  The skin is a little erythematous in the left chest, question  dry.  Note, anterior thigh with patchy erythematous areas not raised.  Back in the right mid lateral back, along the belt line, there is some  mild erythema.  Blood pressure 132/100, pulse 68, weight 179.   IMPRESSION:  Immunologic.  I have seen the patient now several times.  He may have some erythema that he  did not have before.  He is  complaining of this.  His neck actually looks like he has been  scratching it, though he denies.  He says he is actually taking showers  now with just baby shampoo or just water.   I have spoken to Dr. Laurette Schimke on the telephone today.  I told him  about the patient's findings.  I am not convinced that they are allergic  in origin.  He recommended mometasone 0.1% ointment to areas once and up  to twice a day.  Given a prescription for this.  The patient says he has  a tube at home.  He should continue on the Pepcid.  Also take Zyrtec.  I  will check a CBC with differential and ESR.  He is also due for labs for  cardiac  catheterization.   Overall, I am not clear what this all represents.  When he was on  Ticlid, there was no rash that was observed by any health care Marvion Bastidas  to my knowledge.  He was more concerned it was making him itch.  He is  still itching off of the Plavix and the Ticlid.  I do not think it is  the Lovenox and I told him he should go on it twice a day as scheduled.  We will schedule his other medicines.  Question if indeed it is a  reaction to the stent coating.  I think going ahead with cardiac  catheterization next week is the right plan.     Pricilla Riffle, MD, [facility]  Electronically Signed    PVR/MedQ  DD: 12/22/2007  DT: 12/22/2007  Job #: [id number]   cc:   Dr. Phillips Odor

## 2011-05-15 NOTE — Assessment & Plan Note (Signed)
[facility]                        CARDIOLOGY OFFICE NOTE   [name] [name], [name] [name]                     MRN:          [id number]  DATE:08/26/2008                            DOB:          1951/02/19    PRIMARY CARE PHYSICIAN:  Corrie Mckusick, M.D.   REASON FOR VISIT:  Routine cardiac followup.   HISTORY OF PRESENT ILLNESS:  This is my first meeting with [name] [name].  He has been followed by Dr. Tenny Craw and was last seen in the clinic back in  June 2009.  I reviewed his history, which includes complex intervention  to the left anterior descending by Dr. Juanda Chance back on August 19, 2007.  This was in the setting of a non-ST elevation myocardial infarction.  He  had a bifurcation lesion involving the left anterior descending and  second diagonal and required 2 drug-eluting stents in the diagonal  branch with 1 drug-eluting stent in the left anterior descending.  His  subsequent course was complicated by Plavix allergy.  He was ultimately  desensitized to Plavix during a hospital stay, but subsequently stopped  taking the medicine on his own and was switched to Ticlid.  There was  some question as to whether he had a rash on Ticlid as well, although  generally has seemed to tolerate this, and at least as of his last visit  with Dr. Tenny Craw, he has been taking it regularly.  His last angiogram was  in December 2008, which revealed mild-to-moderate diffuse disease with  approximately 40% narrowing within the proximal left anterior descending  stent and 30% narrowing within the diagonal stent.  He has occasional  episodes of chest pain, but nothing progressive and seems to tolerate  walking in the morning on a regular basis.  I do note that he was seen  in the Emergency Department back in July.  He states that he presented  at that time with some chest pain and lightheadedness that occurred  after he was working in a [employer].  He states that he  took  sublingual nitroglycerin and some of his p.r.n. Valium and ultimately  felt better.  This has not recurred since then.  Records from that stay  indicated a normal troponin I level and his electrocardiogram at that  time was also normal.   Today, I reviewed the situation with [name] [name] and also discussed him  with Dr. Tenny Craw by phone in terms of her original plan for duration of his  dual anti-platelet therapy.  He is now one year out from his  percutaneous intervention and is relatively stable.  I reviewed his  medications outlined below.  We also talked about general risk factor  modification strategies such as exercise and attention to his lipids.  His last LDL assessment was in June, noted to be 78 at that time.  I  also see that he has had serial CBCs done on a 2-week basis while on  Ticlid and these have been largely unremarkable.   ALLERGIES:  PENICILLIN and PLAVIX.  Question of a mild rash/itching on  TICLID, although he has generally  tolerated this.   PRESENT MEDICATIONS:  1. Aspirin 325 mg p.o. daily.  2. Cozaar 100 mg p.o. daily.  3. Toprol-XL 25 mg p.o. b.i.d.  4. Vytorin 10/40 mg p.o. daily.  5. Ticlid 250 mg p.o. b.i.d.  6. Sublingual nitroglycerin 0.4 mg p.r.n.  7. Valium 5 mg one-quarter tablet p.r.n.  8. Ambien 10 mg p.o. q.h.s. p.r.n.  9. Xanax 0.5 mg p.o. p.r.n.   REVIEW OF SYSTEMS:  As described in the history of present illness.  Otherwise negative.   PHYSICAL EXAMINATION:  Blood pressure is 120/90, heart rate is 68, and  weight is 178 pounds.  The patient is comfortable and in no acute distress.  HEENT:  Conjunctiva was normal.  Oropharynx is clear.  NECK:  Supple.  No elevated jugular venous pressure.  No audible bruits.  No thyromegaly is noted.  LUNGS: Clear without labored breathing at rest.  CARDIAC:  Regular rate and rhythm. No loud murmur or S3 gallop.  No  pericardial rub.  ABDOMEN:  Soft and nontender.  EXTREMITIES:  Exhibits no frank  pitting edema.  Distal pulses are 2+.  SKIN:  Warm and dry.  He does have somewhat papular appearance to his  flanks and back, which he reports is consistent with his itching.  This is nonerythematous.  MUSCULOSKELETAL:  No kyphosis noted.  NEUROPSYCHIATRIC:  The patient is alert and oriented x3.   IMPRESSION AND RECOMMENDATIONS:  1. Coronary artery disease as outlined above, status post complex      percutaneous intervention to the left anterior descending and      diagonal back in August 2008.  Complicating this is a documented      Plavix allergy, which actually required desensitization, although      the patient elected to stop Plavix after this anyway.  He was      subsequently treated with Ticlid and is now one year out from his      original drug-eluting stents.  I discussed this in detail with Dr.      Tenny Craw who has been following the patient long term, and at this      point, the decision is to stop his Ticlid and continue aspirin      alone.  I did speak with the patient about late stent thrombosis as      well.  Clearly, if he is in need of further percutaneous      interventions, I would give serious consideration to using a bare-      metal stent if possible to reduce the time required for dual anti-      platelet therapy.  He would also need to use Ticlid rather than      Plavix given his prior problems.  Symptomatically, he seems to be      relatively stable.  I note that his left ventricular systolic      function has been normal based on prior assessment.  We talked      about a basic walking regimen and also continuing his other      medications as directed.  We will plan to see him back over the      next 3 months.  2. Hyperlipidemia, on statin therapy.  He previously did not tolerate      Lipitor, but seems to be tolerating Vytorin fairly well.  He will      be due for followup liver function and lipids around the time of  his next visit.  His last LDL was 78.   3. History of hypertension, largely diastolic at times.  He brings in      a home record showing reasonable control, however.     Jonelle Sidle, MD  Electronically Signed    SGM/MedQ  DD: 08/26/2008  DT: 08/27/2008  Job #: [id number]   cc:   Corrie Mckusick, M.D.

## 2011-05-15 NOTE — Assessment & Plan Note (Signed)
[facility]                       [place] CARDIOLOGY OFFICE NOTE   COREY, LASKI                     MRN:          [id number]  DATE:02/09/2008                            DOB:          12/29/1951    IDENTIFICATION:  Mr. Oguinn is a 60 year old gentleman whom I have  followed in clinic.  He has a history of CAD status post PTCA stent to  the LAD diagonal back in August of last year, procedure complicated by  an allergic reaction to Plavix and question Ticlid   The patient was just hospitalized and released for Plavix  desensitization at the end of January.  He tolerated it well.   He comes in today. He said his skin is a little sensitive around his  neck and shoulder area.  He thinks it is getting better.  Denies rash,  no wheezing.  No difficulty swallowing.  No chest pain.   CURRENT MEDICATIONS:  1. Aspirin 325 daily.  2. Lipitor 40.  3. Pepcid 20 b.i.d.  4. Plavix 75.  5. Cozaar 50.   PHYSICAL EXAMINATION:  VITAL SIGNS:  Blood pressures from home ranged  from 120s-140s systolic over 89-102 diastolic. Here blood pressure  122/90, pulse 62, weight 184.  SKIN:  No rashes.  NECK:  JVP is normal.  No rashes.  LUNGS:  Clear without wheezes or rales.  CARDIAC:  Regular rate and rhythm, S1-S2.  No S3 no murmurs.  ABDOMEN:  Benign.  EXTREMITIES:  No edema.   IMPRESSION:  1. Coronary artery disease status post intervention as noted, now on      Plavix.  I warned him that he cannot miss a day or could have an      anaphylactic reaction.  He is not to take a beta blocker.  I will      see him back in a few months.  2. Dyslipidemia.  Will need to get fasting lipids.  3. Hypertension. Still needs better control.  I would increase Cozaar      to 100.  Will check a BMET next week.   Again, followup as noted, sooner if problems develop.     Pricilla Riffle, MD, [facility]  Electronically Signed    PVR/MedQ  DD: 02/09/2008  DT: 02/10/2008  Job  #: [id number]   cc:   Corrie Mckusick, M.D.

## 2011-05-15 NOTE — Discharge Summary (Signed)
NAME:  [name] [name], [name] [name] NO.:  [id number]   MEDICAL RECORD NO.:  [id number]          PATIENT TYPE:  INP   LOCATION:  2041                         FACILITY:  [facility]   PHYSICIAN:  Pricilla Riffle, MD, FACCDATE OF BIRTH:  08-May-1951   DATE OF ADMISSION:  08/28/2007  DATE OF DISCHARGE:  09/02/2007                               DISCHARGE SUMMARY   ADDENDUM   PRIMARY CARDIOLOGIST:  Dr. Dietrich Pates   PRIMARY CARE Mahala Rommel:  Dr. Phillips Odor   MEDICATION LIST:  Protonix 40 mg daily.      Nicolasa Ducking, ANP      Pricilla Riffle, MD, [facility]  Electronically Signed    CB/MEDQ  D:  09/02/2007  T:  09/02/2007  Job:  [id number]

## 2011-05-15 NOTE — Assessment & Plan Note (Signed)
[facility]                       [place] CARDIOLOGY OFFICE NOTE   LEXINGTON, KROTZ                     MRN:          [id number]  DATE:02/14/2009                            DOB:          December 28, 1951    CARDIOLOGIST:  Jonelle Sidle, MD   PRIMARY CARE PHYSICIAN:  Corrie Mckusick, MD   REASON FOR VISIT:  Three-month followup.   HISTORY OF PRESENT ILLNESS:  Mr. [name] [name] is a 60 year old male patient  with a history of coronary artery disease status post non-ST-elevation  myocardial infarction in August 2008, treated with 2 drug-eluting stents  in the second diagonal and 1 in the LAD.  I last saw him in followup on  November 17, 2008.  Please see that note for complete details.  Briefly,  the patient was to start on hydrochlorothiazide for better blood  pressure control.  Follow-up blood work indicated a normal creatinine  and potassium.  He continued to have a rash on his trunk and we asked  him to go back to see his dermatologist.  He refrained from doing that.  He notes that he has noticed quite a bit of mud in the well water and  thinks he may have an allergy to one of the minerals.  He is going to  hold off on seeing his dermatologist until he can get his well water  treated.  He had some paresthesias that prompted followup blood work.  His B12, folate, and TSH were all normal.  His follow-up lipid panel  recently demonstrated an LDL of 96, HDL 43, triglycerides 70, total  cholesterol 153.  The patient also noted some palpitations last time.  We briefly discussed an event monitor, but he wanted to hold off on  this.  He thought that most of his symptoms are probably related to  anxiety.  In the office today, he notes he is doing well.  He denies any  chest heaviness or tightness with exertion.  He does note occasional  chest discomfort associated with meals that seems to be relieved by  antacids.  He denies any symptoms reminiscent of his  previous angina.  He denies orthopnea, PND, or pedal edema.  Denies any syncope.  He does  note some leg pain that awakens him from sleep from time to time.  He  denies any exertional leg pain.  He thinks the left leg is somewhat  worse than the right.  He denies any further palpitations.   CURRENT MEDICATIONS:  Aspirin 325 mg daily, Cozaar 100 mg daily, Toprol-  XL 25 mg b.i.d., Crestor 5 mg daily, Hydrochlorothiazide 12.5 mg daily,  Nitroglycerin p.r.n., Valium p.r.n.,  Ambien p.r.n., Xanax p.r.n.   ALLERGIES:  PENICILLIN, PLAVIX, TICLID.   PHYSICAL EXAMINATION:  GENERAL:  He is a well-nourished, well-developed  male in acute distress.  VITAL SIGNS:  Blood pressure is 133/91, pulse 66, weight 182 pounds.  HEENT:  Normal.  NECK:  Without JVD.  Carotids without bruits bilaterally.  CARDIAC:  Normal S1, S2.  Regular rate and rhythm.  LUNGS:  Clear to auscultation bilaterally.  ABDOMEN:  Soft, nontender with  normoactive bowel sounds.  EXTREMITIES:  Without edema.  NEUROLOGIC:  He is alert and oriented x3.  Cranial nerves II through XII  grossly intact.  VASCULAR:  As outlined above.  No carotid bruits noted bilaterally.  His  femoral artery pulses are difficult to palpate on the left, 1+ on the  right.  No femoral artery bruits noted.  Posterior tibialis pulses are 1-  2+ bilaterally.  It is difficult to palpate the dorsalis pedis pulses  bilaterally.  Popliteal pulses cannot be palpated as well.   ASSESSMENT AND PLAN:  1. Coronary artery disease status post non-ST-elevation myocardial      infarction in August 2008 treated with drug-eluting stents to the      second diagonal x2 and left anterior descending x1.  As outlined      previously, he had a Plavix allergy and it was eventually switched      over to Ticlid.  He is now on aspirin only.  He is not having any      anginal symptoms at this time.  No further ischemic workup is      warranted at this time.  The patient asked me  about supplements to      improve nitric oxide.  I asked him to bring these in to let us see      them before he starts taking them.  I have also warned him of      taking the supplements with phosphodiesterase inhibitors as well as      taking nitroglycerin in the setting of using these supplements.  2. Hypertension.  The patient's blood pressure seems to be under fair      control.  He would likely benefit from a higher dose of      hydrochlorothiazide to get his diastolic below 90.  I recommend      that he come off of the Cozaar and hydrochlorothiazide and switch      over to Hyzaar 100/25 mg a day.  He will follow up with a BMET a      week after he makes this change.  3. Dyslipidemia.  His goal LDL would be less than or equal to 70.  He      was just above that goal recently.  We will increase his Crestor to      10 mg a day.  He will have followup lipids and LFTs in 3 months.  4. Leg pain.  He is concerned about peripheral arterial disease.  His      pulses on the left are somewhat more difficult to palpate, although      I do not appreciate any bruits.  With his coronary disease, he      would certainly be at risk for peripheral arterial disease.  We      will set him up for lower extremity arterial Dopplers and ABIs.  5. Anxiety.  The patient thinks he has a lot of symptoms of anxiety.      I have asked him to follow up with his primary care physician for      further medication adjustments for his anxiety.   DISPOSITION:  The patient will follow up with Dr. Diona Browner in 6 months  or sooner p.r.n.      Tereso Newcomer, PA-C  Electronically Signed      Gerrit Friends. Dietrich Pates, MD, [facility]  Electronically Signed   SW/MedQ  DD: 02/14/2009  DT: 02/15/2009  Job #: [id number]  cc:   Corrie Mckusick, M.D.

## 2011-05-15 NOTE — Assessment & Plan Note (Signed)
[facility] HEALTHCARE                       [place] CARDIOLOGY OFFICE NOTE   [name] [name], [name] [name]                     MRN:          [id number]  DATE:11/17/2008                            DOB:          Jul 28, 1951    CARDIOLOGIST:  Jonelle Sidle, MD   PRIMARY CARE PHYSICIAN:  Dr. Assunta Found.   REASON FOR VISIT:  A 4-month followup.   HISTORY OF PRESENT ILLNESS:  [name] [name] is a 60 year old male patient  with a history of coronary artery disease status post non-ST-elevation  myocardial infarction in August 2008, who was treated with a complex PCI  secondary to a bifurcating lesion involving the second diagonal and LAD.  He had 2 drug-eluting stents placed in the second diagonal and 1 in the  LAD.  He had a PLAVIX allergy and subsequently underwent  desensitization.  However, he was treated with Ticlid after this.  When  he saw Dr. Diona Browner in August 2009, he had his Ticlid discontinued and  he is now on full-dose aspirin.   He returns for the followup today.  From a cardiovascular standpoint, he  seems to be doing well.  He denies any exertional chest heaviness or  tightness.  He denies any significant shortness of breath.  He denies  any syncope.  He did have an episode couple of weeks ago, where he felt  a fluttering in his chest and was significantly lightheaded.  It has not  occurred since then.  He apparently has had problems with anxiety in the  past and thinks it could have been a panic attack.   He also has a significant problem with rashes in the past.  This has  been ongoing since his problems with the Plavix.  He did show me a rash  as outlined below in the physical exam, that is on his trunk.  He also  has had some problems with paresthesias.  This is mainly a burning  sensation when he is in the shower on his lateral neck as well as on his  inner thighs bilaterally.  No significant rashes have been noted in  these areas.  This has  also been present since his Plavix allergy was  noted.   He also notes a problem with Vytorin.  He had significant discomfort in  his hands bilaterally.  He also had some evidence of trigger finger and  actually saw a hand surgeon in [place], who performed some steroid  injections that has provided significant relief.  He is now on Crestor.   CURRENT MEDICATIONS:  Aspirin 325 mg daily, Cozaar 100 mg daily, Toprol-  XL 25 mg b.i.d., Crestor 5 mg daily, Nitroglycerin p.r.n., Valium  p.r.n., Ambien 10 mg nightly p.r.n., Xanax p.r.n.   PHYSICAL EXAMINATION:  GENERAL:  He is a well-nourished, well-developed  man in no distress.  VITAL SIGNS:  Blood pressure is 120/90, pulse 68, and weight 184 pounds.  HEENT:  Normal.  NECK:  Without JVD.  CARDIAC:  Normal S1 and S2.  Regular rate and rhythm.  LUNGS:  Clear to auscultation bilaterally.  ABDOMEN:  Soft and nontender.  EXTREMITIES:  No edema.  NEUROLOGIC:  He is alert and oriented x3.  Cranial nerves II-XII are  grossly intact.  VASCULAR:  Without carotid bruits bilaterally.  SKIN:  Papilliform rash noted on his posterior trunk in the flank region  bilaterally.  No excoriations noted.  No erythema noted.   Electrocardiogram reveals a sinus bradycardia with a heart rate of 52,  normal axis, and no acute changes.   ASSESSMENT AND PLAN:  1. Coronary artery disease status post non-ST-elevation myocardial      infarction in August 2008, treated with a complex percutaneous      coronary intervention of the second diagonal and left anterior      descending with drug-eluting stents as outlined above.  He has      preserved left ventricular function.  His last cardiac      catheterization in December 2008 demonstrated patent stents in the      left anterior descending and diagonal, and moderate nonobstructive      disease elsewhere.  He is not having any symptoms reminiscent of      his previous angina.  No further cardiovascular testing  is      warranted at this time.  2. Hypertension.  He continues to exhibit signs of uncontrolled      diastolic pressures.  I think we would most likely get his      diastolic below 90 with a low-dose diuretic.  I have recommended      adding HCTZ 12.5 mg daily.  If he tolerates this well, he can      always switch to Hyzaar.  He will have a followup blood pressure      check with a nurse in a week and a followup BMET in a week to look      at his renal function and potassium.  3. PLAVIX allergy.  He is now off of Ticlid and only taking aspirin.      However, he has continued to note a papilliform rash about his      trunk.  This has been consistent since he was taken off of Plavix      initially and went through desensitization.  He had seen Dr. Margo Aye,      the dermatologist, in the past, and I have recommended that he      return to see Dr. Margo Aye for further evaluation of his rash.  4. Paresthesias.  The etiology of this is uncertain.  I think he has      had significant workup for this in the past.  We are obtaining      blood work in a week to look at his renal function and potassium      with HCTZ.  At that time, I will also check a CBC, B12 and folate,      as well as a TSH to make sure there is not any metabolic reason for      him to have paresthesias.  5. Dyslipidemia.  He had problems tolerating Vytorin.  He is now on      Crestor.  His most recent lipid panel drawn yesterday demonstrates      a total cholesterol of 216, triglycerides of 168, HDL of 35, and      LDL 147.  He will likely need a higher dose of Crestor, but we will      see if he can tolerate this first and recheck lipids and LFTs in 8  weeks.  6. Palpitations.  The patient had some fluttering in his chest a few      weeks ago, with some lightheadedness.  I have suggested proceeding      with a 21-day event monitor.  He would like to hold off on this for      now as he thinks it is likely related to anxiety  more than anything      else.  He can call our office should he have any recurrent      palpitations.  At that point, we can certainly set him up for an      event monitor.   DISPOSITION:  The patient will be brought back in followup with myself  for Dr. Diona Browner in the next 3 months or sooner.      Tereso Newcomer, PA-C  Electronically Signed      Jonelle Sidle, MD  Electronically Signed   SW/MedQ  DD: 11/17/2008  DT: 11/18/2008  Job #: [id number]   cc:   Corrie Mckusick, M.D.

## 2011-05-15 NOTE — Assessment & Plan Note (Signed)
[facility] HEALTHCARE                            CARDIOLOGY OFFICE NOTE   AHMON, TOSI                     MRN:          [id number]  DATE:01/15/2008                            DOB:          [birth year]    CLINICAL HISTORY:  Mr. Cueto was readmitted today for Plavix  desensitization.  Unfortunately, we did not advised him to stop his  Toprol which he needs to be off for 4 days prior to the desensitization  process.  He also was taking prednisone for a possible reaction. He also  had been started on prednisone after an ER visit for what was  I believe  bronchitis episode.  We have reschedule him to come in a week from  tomorrow on a Friday.  Will taper his Toprol. He is currently taking 15  the morning and 25 the afternoon and will have him take 25 twice a day  Friday and Saturday and then 25 once a day on Saturday and Monday and  none after Monday.   PLAN:  To come in on Friday for the desensitization.   Blood pressure today was 150/92 to 100 and so will start him on Cozaar  50 mg for blood pressure as we taper his Toprol.     Bruce Elvera Lennox Juanda Chance, MD, [facility]  Electronically Signed    BRB/MedQ  DD: 01/15/2008  DT: 01/15/2008  Job #: [id number]   cc:   Pricilla Riffle, MD, [facility]

## 2011-05-15 NOTE — Assessment & Plan Note (Signed)
[facility] HEALTHCARE                       [place] CARDIOLOGY OFFICE NOTE   AYCE, PIETRZYK                     MRN:          [id number]  DATE:09/05/2007                            DOB:          03/16/1951    IDENTIFICATION:  [name] [name] is a 60 year old gentleman with a history of  CAD.  He underwent PTCA stent to the LAD/diagonal (x3) with a PROMUS  stent.   His post-procedure was complicated by the development of a rash.  His  Plavix was initially stopped and he was placed on p.o. steroids with an  IM injection x1.  He was then placed on Ticlid.  On the Ticlid, the rash  continued to worsen and, a week ago this Thursday, he was admitted to  [facility] for further monitoring.  He was taken off of Ticlid and  placed initially on Pletal, Lovenox, 1 dose of Coumadin.  He was stopped  Lipitor and beta blocker.  He was also given Atarax and Protonix.   Over the Labor Day weekend, his rash improved.  He was given 2 doses of  Ticlid, it seemed to increase, and this was discontinued, feeling he may  have some cross reaction.  Aspirin was added back to his regimen and he  was placed back on Lipitor.   The patient was discharged on Tuesday.  Again, he still had residual  rash on his waist and legs, some stomach, but his back was improved and  his overall rash appeared to be improving.  He had no wheezing  throughout any of this.   Over the past week, he is on a steroid taper.  He is now on 60 mg of  prednisone daily.  He notices the itching is increased.  He has actually  also developed thrush and has been seen by his primary care and treated  for this.   He denies chest pain.   CURRENT MEDICATIONS:  1. Aspirin 625 daily.  2. Lovenox 80 mg subcutaneous b.i.d.  3. Protonix 40 daily.  4. Prednisone taper as noted.  5. Duke's mixture.  6. Ambien 10 nightly.  7. Atarax 50 mg q.6.  8. Valium p.r.n.   PHYSICAL EXAM:  On exam, the patient is in no  acute distress.  Blood pressure 148/93, pulse 87, and weight is 174.  SKIN:  Macular rash along his trunk, mild on back, legs, and arms.  The  face is spared.  HEENT:  Oropharynx was with erythema.  LUNGS:  Clear without wheezes.  CARDIAC:  Regular rate and rhythm, S1, S2, no murmurs.  ABDOMEN:  Benign.  Again, rash is noted.  EXTREMITIES:  No edema.  Rash is noted.   IMPRESSION:  1. Rash.  I still do not know if it is with the taper proceeding too      quickly and it is a rash that is resolving from the drugs      discontinued.  He has just stopped the Lipitor again.  I would keep      him on aspirin for now, again concern is it secondary to this.  I  would keep him on Lovenox.  I have discussed the case with Dr.      Riley Kill and Dr. Jimmey Ralph, and Dr. Lucie Leather.  I would set to see the      patient back in clinic on Monday if his symptoms worsen or he      becomes short of breath to call me, or if he gets chest pain, to      present to the emergency room.  Dr. Lucie Leather has agreed to see him on      Tuesday morning.  He will have his paperwork with him when he goes.  2. Coronary artery disease as noted above.  Complicated, question drug      reaction, which drug?  He needs antiplatelet agents and Lovenox for      now.  The fact that his rash got better transiently makes me less      suspicious that it is the stents, which again have rarely been      associated with reaction.  3. Dyslipidemia.  Hold Lipitor for now.   FOLLOWUP:  Again, as noted above.     Pricilla Riffle, MD, [facility]  Electronically Signed    PVR/MedQ  DD: 09/05/2007  DT: 09/05/2007  Job #: [id number]

## 2011-05-15 NOTE — H&P (Signed)
NAME:  [name] [name], [name] [name] NO.:  [id number]   MEDICAL RECORD NO.:  [id number]          PATIENT TYPE:  EMS   LOCATION:  [facility]                         FACILITY:  [facility]   PHYSICIAN:  Peter M. Swaziland, M.D.  DATE OF BIRTH:  1951-08-22   DATE OF ADMISSION:  08/19/2007  DATE OF DISCHARGE:                              HISTORY & PHYSICAL   PRIMARY CARE PHYSICIAN:  Dr. Phillips Odor in [place], [place].   CHIEF COMPLAINT:  Burning chest pain.   HISTORY OF PRESENT ILLNESS:  This is a 60 year old white male with no  previous cardiac history, but with risk factors of hyperlipidemia and  tobacco, who presents with chest burning since 8 p.m. last night.  The  burning discomfort started while the patient was ambulating and was  relieved with rest.  He slept well last night; however, the discomfort  recurred today with exertion, rated 4/10.  Location is substernal and  there is associated tingling of the right hand. There is mild shortness  of breath (like I can't take a deep breath), mild nausea and mild  lightheadedness today with exertion.  The patient denies infectious  symptoms of fevers, chills or sweats, denies pleurisy or cough.   PAST MEDICAL HISTORY:  1. Avascular necrosis, status post right hip replacement approximately      7 years ago.  2. Recurrent bronchitis.  3. Hyperlipidemia.   ALLERGIES:  PENICILLIN.  He has been intolerant of his prescribed  STATINS, which cause muscle aches.   MEDICINES:  He has been intolerant of his prescribed statins, which  cause muscle aches.   SOCIAL HISTORY:  He lives in [place].  He is divorced, works at the  post office, smokes a half a pack of cigarettes a day for many years.   FAMILY HISTORY:  Mother is alive with dementia, lives in a nursing home.  Father is deceased with prostate cancer at age 46.  One sister died of  lung cancer in her 6s; other siblings are healthy.  The patient has 2  children who are healthy.   REVIEW OF SYSTEMS:  Positive for chest pain, shortness of breath and  anxiety.  Otherwise, the balance of 14 systems is reviewed in detail and  is negative including no fevers, chills, sweats, weight change,  headache, visual change, vertigo, rash, orthopnea, PND, edema,  claudication, urinary frequency, urgency, dysuria, hematuria, blood in  the stool, nausea, vomiting, heat and cold intolerance.   PHYSICAL EXAMINATION:  VITAL SIGNS:  Temperature 98, pulse 81,  respiratory rate 16, blood pressure initially 158/100, then 119/74.  Saturation 100% on room air.  GENERAL:  This is a pleasant white male in no acute distress.  He is  mildly anxious.  No trouble breathing.  Pupils are round and reactive.  Sclerae are clear.  Extraocular movements are intact.  Mucous membranes  are moist.  Dentition is good.  NECK:  Supple.  No elevation of JVD.  No carotid bruits.  No  lymphadenopathy.  No thyromegaly.  LUNGS:  Clear to auscultation bilaterally without wheezing or rales.  CARDIAC:  Normal rate, regular rhythm,  normal S1 and S2.  No gallops.  There is a 2/6 holosystolic murmur at the apex radiating to the axilla.  ABDOMEN:  Soft, nontender and non-distended.  No bruits.  Normal bowel  sounds.  EXTREMITIES:  No edema.  Dorsalis pedis and radial pulses 2+  bilaterally.  No rash.  Right femoral pulse 2+ without bruit.   ACCESSORY CLINICAL DATA:  Chest x-ray reveals no acute process on my  preliminary read.   Electrocardiogram shows sinus rhythm with a rate of 83, mild  inferolateral ST depressions, non-diagnostic, less than 1 mm.  There is  no previous EKG for comparison.   LABORATORY DATA:  White blood cell count 11, hemoglobin 15.1.  Sodium  135, potassium 4.8, BUN 10, creatinine 0.9, glucose 121.  CK-MB 4.3.  Troponin 0.09 and then repeat 0.48.  BNP 33.   IMPRESSION:  A 60 year old white man with multiple cardiac risk factors  presents with 2 days of burning chest pain and abnormal, but  non-  diagnostic electrocardiogram and troponin; this is concerning for  unstable angina.   PLAN:  1. Admit to telemetry bed.  2. Rule out myocardial infarction by cycling serial cardiac enzymes      and EKGs.  3. Daily aspirin.  4. Therapeutic Lovenox for anticoagulation and initiate Lopressor 25      mg b.i.d.  5. We will initiate low-dose statin, as he has been intolerant in the      past, we will start with 10 mg of Lipitor.  Check fasting lipid      panel.  6. Check fasting a.m. glucose to rule out diabetes or prediabetes.  7. The patient is still not pain-free.  We will initiate nitroglycerin      paste and morphine and titrate until the patient is pain-free.  If      his cardiac enzymes continue to trend upward, we will initiate      glycoprotein IIb/IIIa inhibitor.  8. We will keep the patient n.p.o. after midnight and hydrate in      anticipation of possible cardiac catheterization.  9. We will check a D-dimer and consider chest CT scan to rule out      pulmonary embolism.  10.Tobacco cessation will need to be enforced.  11.Empiric proton pump inhibitor.      Christell Faith, MD  Electronically Signed     ______________________________  Peter M. Swaziland, M.D.    NDL/MEDQ  D:  08/19/2007  T:  08/19/2007  Job:  [id number]

## 2011-05-15 NOTE — Assessment & Plan Note (Signed)
[facility] HEALTHCARE                            CARDIOLOGY OFFICE NOTE   TIPTON, BALLOW                     MRN:          [id number]  DATE:01/08/2008                            DOB:          02-13-1951    PRIMARY CARE PHYSICIAN:  Dr. Geanie Cooley in [place].   PAST MEDICAL HISTORY:  Mr. Bonsell is 60 years old and returns for  management of his coronary heart disease and PLAVIX allergy.  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  He had 2 drug-  eluting stents in the diagonal branch and 1 in the LAD.  He subsequently  developed an allergy to the PLAVIX with a rash and then an allergy to  TICLID with a rash and has been managed on subcu Lovenox and aspirin  since that time.  I saw him in consultation with Dr. Tenny Craw a couple weeks  ago and we arranged for repeat angiography with the thought that we  might consider bypass surgery.  I was concerned that his left main  disease might be significant.  On repeat catheters, his left main,  ostial, LAD, and circumflex did not appear to be very severe at all, and  his stent was quite good and felt the Plavix desensitization might be  the best option.  He returns now for discussion of this.   PAST MEDICAL HISTORY:  Significant for hyperlipidemia.   CURRENT MEDICATIONS:  Include aspirin, Lipitor, Lovenox, famotidine,  Toprol, and prednisone.   EXAMINATION:  The blood pressure was 160/93 and pulse 57 and regular.  He indicated blood pressure had been 130-150 over 95-105 at home.  There was no venous tension.  The carotid pulses were full without  bruits.  CHEST:  Was clear.  CARDIAC:  Rhythm was regular.  I could hear no murmurs or gallops.  ABDOMEN:  Soft with normal bowel sounds.  There is no  hepatosplenomegaly.  Peripheral pulses full and no  peripheral edema.   IMPRESSION:  1. Coronary artery disease status post complex PCI procedure of a      bifurcation lesion with drug-eluting stents in August 2008 with      correction with no evidence of restenosis at followup cath.  2. Good left ventricular function.  3. Hyperlipidemia.  4. PLAVIX allergy and TICLID allergy.   RECOMMENDATIONS:  I have reviewed the situation with my colleagues and  discussed the situation with Dr. Maple Hudson, and obtained a protocol for  Plavix desensitization from Dr. Sedalia Muta at [facility] in [place].  Will plan to have Mr. Flanigan see Dr. Maple Hudson in consultation early next  week and will plan to bring Mr. Purdom in for hospitalization for  desensitization next  Thursday.  I will discuss with Melony Overly to make sure that we are able  to prepare the small doses of Plavix required for the desensitization  process.     Bruce Elvera Lennox Juanda Chance, MD, [facility]  Electronically Signed  BRB/MedQ  DD: 01/08/2008  DT: 01/08/2008  Job #: [id number]   cc:   Joni Fears D. Maple Hudson, MD, FCCP, FACP

## 2011-05-15 NOTE — Assessment & Plan Note (Signed)
[facility] HEALTHCARE                       [place] CARDIOLOGY OFFICE NOTE   DANNI, LEABO                     MRN:          [id number]  DATE:06/24/2008                            DOB:          1951/11/01    IDENTIFICATION:  Mr. Labreck is a 60 year old gentleman with a history of  CAD (status post PTCA/stent to the LAD and diagonal in August 2008).  Post procedure was complicated by Plavix allergy, underwent  desensitization.  I last saw him in May.   In the interval, the patient says he had a URI several days ago.  He was  taking aspirin, increased dose for a fever and he stopped the Plavix.  He says actually he feels a lot better off it.  He thinks that he does  not feel swollen.   CURRENT MEDICINES:  1. Aspirin 325.  2. Cozaar 100.  3. Toprol XL 25 b.i.d.  4. Vytorin 10/40.   PHYSICAL EXAMINATION:  GENERAL:  The patient is in no distress at rest.  VITAL SIGNS:  Blood pressure is 128/90, pulse is 68, and weight 180.  Note, the patient says his blood pressure at home is usually in the 120-  138s/80s.  LUNGS:  Clear.  CARDIAC:  Regular rate and rhythm.  S1-S2, no S3.  No murmurs.  ABDOMEN:  Benign.  EXTREMITIES:  No edema.   LABORATORY DATA:  Total cholesterol 133, LDL of 78, HDL of 33, and  triglycerides 110.   IMPRESSION:  1. Coronary artery disease.  No signs of active angina on exam.  I had      a long talk with him.  We have spent many times talking about the      importance of staying on the Plavix with all that he has gone      through.  If he were to take an additional dose now, he could have      anaphylactic reaction and he cannot do this.  I have discussed the      case with Charlies Constable.  He is less than a year out and has had a      complex intervention on his left anterior descending diagonal.  We      thought the best thing to do would be to resume Ticlid 250 twice a      day.  He will get a CBC and platelets now and then  every 2 weeks.      I have given him an EpiPen and also told him to take Benadryl.  If      he has any problems, I would favor again the Benadryl first.  He      should not take the Plavix and I stressed this with him.  He will      be set to be seen in August again for continued care and length of      medicine.  2. Hypertension.  Blood pressures at home are better than they are      here.  We will need to follow up closely, and told him to bring the  cuff in with blood pressure recordings.  3. Dyslipidemia, better.  I still think he can improve.  I would keep      him on the same dose of Vytorin 10/40 and increase his exercise.   Followup in August, sooner if problems develop.     Pricilla Riffle, MD, [facility]  Electronically Signed    PVR/MedQ  DD: 06/24/2008  DT: 06/25/2008  Job #: [id number]   cc:   Dr. Phillips Odor

## 2011-05-15 NOTE — Assessment & Plan Note (Signed)
[facility] HEALTHCARE                       [place] CARDIOLOGY OFFICE NOTE   [name] [name], [name] [name]                     MRN:          [id number]  DATE:10/13/2007                            DOB:          July 19, 1951    IDENTIFICATION:  Mr. Alfieri is a 60 year old gentleman, history of CAD,  status post stent x3 (drug-eluting to LAD/diagonal).  Postprocedure  complicated by Plavix reaction.  He was admitted, seen here, and  actually seen by Dr. Lucie Leather in Allergy.  His steroid taper is finished,  and he has been slowly started on Ticlid.  He is now on Ticlid 250  b.i.d.   Coming in today, he denies shortness of breath or wheezing.  No itching.  No chest pain.   CURRENT MEDICATIONS:  Include:  1. Lovenox 80 subcutaneous q.12.  2. Ambien p.r.n.  3. Pepcid 40.  4. Aspirin 650.  5. Metoprolol 25.  6. Ticlid 250 b.i.d.   PHYSICAL EXAMINATION:  The patient is in no distress.  Blood pressure is 154/98.  Pulse is 68 and regular.  SKIN:  Without rashes.  Some bruising where the Lovenox injection are.  LUNGS:  Clear.  No wheezes.  CARDIAC EXAM:  Regular rate and rhythm.  S1 and S2.  No S3.  No murmurs.  ABDOMEN:  Bruising as noted, otherwise benign.  EXTREMITIES:  No edema.   LABS:  From today, hemoglobin is 14.  Platelets are 241,000.  WBC of  7.7.   IMPRESSION:  1. Coronary artery disease.  Remains asymptomatic.  Tolerating the      Ticlid, thankfully.  I would take him off Lovenox, off double dose      aspirin to 325 daily.  Continue on Ticlid b.i.d.  Will need to have      his platelets followed every couple of weeks.  2. Hypertension.  High.  I would increase his metoprolol to 50 daily.      Indeed, he may need another agent, but I would start with this for      now.  3. Dyslipidemia.  We will go ahead and get him back on Lipitor.  I      told him to take one half tablet daily.   I will see him in clinic the week after this (Thursday the 22nd).  If  he  has problems, he can call sooner.   Again, he can stop the Lovenox today.     Pricilla Riffle, MD, [facility]  Electronically Signed    PVR/MedQ  DD: 10/13/2007  DT: 10/14/2007  Job #: [id number]   cc:   Corrie Mckusick, M.D.  Jessica Priest, M.D.

## 2011-05-15 NOTE — Assessment & Plan Note (Signed)
[facility] HEALTHCARE                       [place] CARDIOLOGY OFFICE NOTE   LEGION, DISCHER                     MRN:          [id number]  DATE:09/08/2007                            DOB:          1951/11/07    IDENTIFICATION:  Mr. [name] [name] is a 60 year old gentleman who I saw back on  Friday, September 5.  He is again status post PTCA stent to the  LAD/diagonal (Promus stent x3), post procedure complicated by a rash.   When I saw him on Friday, the rash still had not resolved.  It actually  looked a little worse than it had in the hospital.  I spoke with Jessica Priest, M.D., and plan was to have him continue aspirin over the weekend  with Lovenox, to be seen today and also have an appointment with Dr.  Lucie Leather in a.m.   The patient said he is still itchy.  He thinks his rash might be  slightly better.  Having some soreness with swallowing but not too bad.   CURRENT MEDICATIONS:  1. Aspirin 325 mg b.i.d.  2. Lovenox 80 mg q.12h.  3. Protonix 40 mg b.i.d.  4. Prednisone 80 mg daily.  5. Duke's mixture.  6. Pepcid 40 mg daily.  7. Ambien 10 mg q.h.s.  8. Atarax 50 mg t.i.d.   PHYSICAL EXAM:  The patient is in no acute distress.  Blood pressure is 140/80, pulse is 84, weight 175, up 1 pound from  previous.  HEENT:  Oral lesions consistent with thrush.  Slight erythema.  LUNGS:  Clear without rales or wheezes.  CARDIAC:  Regular rate and rhythm.  S1-S2, no S3.  No murmurs.  EXTREMITIES:  No edema.  SKIN:  Still macular rash on the back, torso, minimal on arms, some  buttocks.  Face spared.   IMPRESSION:  1. Immunologic:  Still with rash.  Concerned it may be the aspirin but      will need to review again if hospital dosing.  There were two days      I think he was not on aspirin.  We will need to confirm.  I told      him to keep on this for now.  I am eager to get input from Allergy      tomorrow.  2. Coronary artery disease.  No chest pain.   Continue as noted.  We      will have to discuss again with interventional other agents if we      do need to take him off aspirin.   I put tentative follow-up 2 weeks but, again, that is based on all the  above findings.     Pricilla Riffle, MD, [facility]  Electronically Signed    PVR/MedQ  DD: 09/08/2007  DT: 09/09/2007  Job #: [id number]   cc:   Corrie Mckusick, M.D.

## 2011-05-15 NOTE — Assessment & Plan Note (Signed)
[facility] HEALTHCARE                       [place] CARDIOLOGY OFFICE NOTE   [name] [name], [name] [name]                     MRN:          [id number]  DATE:11/14/2007                            DOB:          1951/01/10    IDENTIFICATION:  Mr. [name] [name] is a gentleman whom I have followed closely  in the clinic.  He is status post myocardial infarction with a  percutaneous transluminal coronary angiography and stent x3 to the LAD  and diagonal.  Post-procedure was complicated by a Plavix reaction.   The patient was last seen in the cardiology clinic on October 31, 2007.  At that time he was on Ticlid 250 mg twice daily.   In the interval, he actually was admitted to [facility] with  chest pain and ruled out for a myocardial infarction.  Discussed with  Gene Serpe, PA-C and with Dr. Luis Abed, and was taken off his  Ticlid.  He had complained of a rash with some erythema at his neck  site.  Just was not feeling good on it.  He was placed on aspirin and  Lovenox twice daily.   In the interval, he says he is doing better.  He is feeling better.  The  rash is improved.  Note that when I saw him in the clinic, long ago, on  Ticlid, I did not appreciate a drug-type reaction.  He denies chest pain  since being discharged.   CURRENT MEDICATIONS:  1. Aspirin 325 mg daily.  2. Lovenox twice daily.  The patient is off of Lipitor, off of      metoprolol, off of Ticlid, off of Pepcid.   PHYSICAL EXAMINATION:  GENERAL:  The patient is in no distress.  VITAL SIGNS:  Blood pressure 130/88, pulse 74, weight 181 pounds.  SKIN:  No obvious rashes.  Some dry skin noted.  NECK:  No jugular venous distention.  LUNGS:  Clear without rales or wheezes.  HEART:  A regular rate and rhythm.  S1 and S2.  No S3, no murmurs.  EXTREMITIES:  No edema.   IMPRESSION:  Rash:  I do not appreciate any lesions today on  examination. Again, he is off of Ticlid.  He claims after I  saw him  things were getting worse and that is why he wanted to stop.  I will  keep him on the same Lovenox and aspirin for now.  I do not think this  is a long-term solution.  I encouraged him to follow up with Dr. Jessica Priest.  He will also look into the compassionate use of __________ .   I will be in touch with the patient next week after he has been seen by  allergy.  I would like to get him back on his other medicines.     Pricilla Riffle, MD, [facility]  Electronically Signed    PVR/MedQ  DD: 11/17/2007  DT: 11/18/2007  Job #: [id number]   cc:   Jessica Priest, M.D.

## 2011-05-15 NOTE — Assessment & Plan Note (Signed)
[facility] HEALTHCARE                       [place] CARDIOLOGY OFFICE NOTE   [name] [name], [name] [name]                     MRN:          [id number]  DATE:12/01/2007                            DOB:          [birth year]    IDENTIFICATION:  [name] [name] is a 60 year old gentleman with coronary  artery disease status post PTCA stent x3 to the LAD diagonal back in  August (3 drug-eluting stents).  He has had a complicated post-procedure  course with drug allergy to Plavix and probably Ticlid, though this is  still a little hazy to me.  He has been seen by Dr. Lucie Leather, though in  allergy, who felt that he definitely had a Ticlid allergy, and has  placed him back on Lovenox b.i.d. and aspirin.   I do not have his chart with me.   CURRENT MEDICATIONS:  1. Aspirin 325 daily.  2. Lovenox b.i.d. injections.   PHYSICAL EXAM:  The patient is in no distress.  Blood pressure 130/90, pulse 66, weight 181.  SKIN:  No rashes.  He has got small spots where it looks like he has  bled a little bit with clot right at the top of pin-sized lesions.  Do  not look like petechia.  LUNGS:  Clear.  CARDIAC:  Regular rate and rhythm.  S1, S2.  No S3.  No murmurs.  ABDOMEN:  Benign.  EXTREMITIES:  No edema.   IMPRESSION:  1. Coronary artery disease.  Doing okay.  No chest pain.  In cardiac      rehab.  I have set up an appointment with Dr. Juanda Chance on December 17      at 11:45 to discuss his anticoagulation and antiplatelet therapy.      Would continue on this for now.  I would place him back on      metoprolol 25 b.i.d. for now.  He will need to follow up his blood      pressures.  2. Dyslipidemia.  Set back on Lipitor 80.  He has asked for some      Pepcid as he still has some gastroesophageal reflux.  I have given      him a prescription for this.  Otherwise, follow up as planned after      he has seen Dr. Juanda Chance.     Pricilla Riffle, MD, [facility]  Electronically Signed    PVR/MedQ   DD: 12/01/2007  DT: 12/01/2007  Job #: [id number]   cc:   Corrie Mckusick, M.D.

## 2011-05-15 NOTE — H&P (Signed)
NAME:  RUDIE, SERMONS NO.:  [id number]   MEDICAL RECORD NO.:  [id number]          PATIENT TYPE:  INP   LOCATION:  2041                         FACILITY:  [facility]   PHYSICIAN:  Pricilla Riffle, MD, FACCDATE OF BIRTH:  [birth year]   DATE OF ADMISSION:  08/28/2007  DATE OF DISCHARGE:                              HISTORY & PHYSICAL   IDENTIFICATION:  Patient is a 60 year old gentleman, who presents for  evaluation of rash.   HISTORY OF PRESENT ILLNESS:  The patient was recently admitted with a  non-Q-wave MI, underwent PTCA stent (Promise drug eluting stent x3,  diagonal with 2 stents, LAD with 1 stent).  He was discharged home on  the August 21, 2007.  Had 30% lesion in his RCA, LVF was 60% at the  time.   At discharge, he was on aspirin, Plavix, Toprol, Lipitor, Chantix, and  sublingual nitroglycerin.   The patient came into the clinic on Monday in [place] because of a  rash and itching.  It was felt that he having an allergic reaction to  Plavix.  This was discontinued and he was given IM plus p.o. steroids,  also prescription for Atarax.  Over the next couple of days, his  symptoms worsened.  He was seen by his primary M.D., Dr. Phillips Odor and  also by Dr. Nita Sells.  I had talked to the patient on the phone and  told him to stop the Lipitor and Toprol.  Continue on the aspirin and  the Ticlid.  A punch biopsy was done by Dr. Margo Aye and found to be  urticaria.  He was given more steroids by Dr. Margo Aye (IM plus p.o.)  yesterday and today.  The rash continues to worsen.  Denies chest pain.  No shortness of breath.  No wheezing.  No diarrhea.   Note the patient did have a tick bite approximately 2 weeks ago on his  back.  No fevers, chills.   ALLERGIES:  PENICILLIN, QUESTION PLAVIX, STATINS with achiness from  PRAVACHOL AND LESCOL.  ALSO ALLERGIC TO ZETIA, I do not have the side  effects with this.   CURRENT MEDICATIONS NOW:  1. Ticlid 250 b.i.d.  2. Aspirin 325  daily.  3. Steroid Dose-Pak.  He took 75 mg p.o. today plus 60 IM by      dermatologist.   PAST MEDICAL HISTORY:  1. CAD as noted above.  2. Dyslipidemia.  3. Tobacco use.  4. Hypertension.  5. History of avascular necrosis of the right hip.  6. Bronchitis.   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  Has a 25-pack-year history of smoking.  Does not drink.   Family history is negative for CAD, positive for cancer.   REVIEW OF SYSTEMS:  All systems reviewed.  Negative to the above  problem, except as noted.   PHYSICAL EXAM:  Patient is currently in no acute distress, except for  itching.  Temperature is 97.0, blood pressure 142/86, pulse is 82, respiratory  rate 18, O2 sat on room air is 99%.  HEENT:  Normocephalic/atraumatic, EOMI, PERL.  Mouth:  Clear.  Mucous  membranes moist.  NECK:  JVP is normal.  No bruits.  No thyromegaly.  LUNGS:  No wheezes or rales.  Clear to auscultation.  CARDIAC EXAM:  Regular rate and rhythm, S1, S2.  No S3, S4 or murmurs.  No rubs.  ABDOMEN:  Supple.  Nontender.  No masses.  EXTREMITIES:  No lower extremity edema, 2+ pulses.  SKIN:  Diffuse maculopapular rash across back onto chest, down legs,  buttocks.  Arms seem to be spared as well as face.  Underarms are  affected.  Chest x-ray:  Not done.  EKG is pending.  Labs:  Pending.   IMPRESSION:  Patient is an unfortunate 60 year old recent stent x3 to  the LAD and D2 Promise stents.  He was initially on Plavix, aspirin,  Toprol, Lipitor, and developed a rash.  Plavix initially discontinued.  Ticlid started.  Steroids given.  Rash worse.  Then all meds  discontinued, except for aspirin and Ticlid.  Again, symptoms worsened  despite more steroids.  Question reaction to Ticlid.  Question stent.   PLAN:  To admit.  Check CBC with eosinophils, diff.  Continue steroids  IV.  Keep on aspirin.  Add Pletal , heparin, Coumadin.  Watch for  improvement.  If not question  stent reaction.  Hold all other medicines  for now despite history of dyslipidemia.  Avoid confusion.  Will  continue to follow.      Pricilla Riffle, MD, [facility]  Electronically Signed     PVR/MEDQ  D:  08/28/2007  T:  08/28/2007  Job:  [id number]

## 2011-05-15 NOTE — Assessment & Plan Note (Signed)
[place] HEALTHCARE                       [place] CARDIOLOGY OFFICE NOTE   MIRAN, KAUTZMAN                     MRN:          [id number]  DATE:10/31/2007                            DOB:          1951-10-19    IDENTIFICATION:  The patient is a 60 year old gentleman with a history  of CAD (status post PTCA/stent x3 to the LAD, diagonal).  Postprocedure  complicated by allergic reaction to Plavix.  The patient is now on  Ticlid.   The patient had no scheduled visit today.  He came in to bring some  paper work, but said he had some itching.  He denied shortness of  breath.  No swelling.   The patient also said that earlier this week he was feeling good and he  decided to run around in the house carrying a small dog and he developed  some diaphoresis and chest pain for which he took a nitroglycerin for  and it resolved.  He has not had any recurrence.  He has been walking  without a problem.   CURRENT MEDICATIONS:  1. Pepcid 40 mg daily.  2. Ticlid 250 mg b.i.d.  3. Aspirin 325 mg daily.  4. Metoprolol 25 mg b.i.d.  5. Lipitor 40 mg q.h.s.   PHYSICAL EXAMINATION:  VITAL SIGNS:  Blood pressure 137/96, pulse 64,  and weight 179.  LUNGS:  Clear.  HEART:  Regular rate and rhythm.  S1 and S2.  No S3.  No murmurs.  ABDOMEN:  Benign.  SKIN:  Appears a little dry with slight raised area.  Does not appear to  be a drug reaction (note the patient is using not a lot of soap when he  showers now and moisturizing with Lubriderm).   IMPRESSION:  1. Allergic reaction.  The patient's skin looks more like a dry skin.      It does not appear to have the macular papular rash like he did on      the Plavix.  I encouraged him to use Dove or Neutrogena soaps and      to again liberally moisturize after showers.  2. Coronary artery disease.  I am not sure what this episode of chest      pain was, but he has not had a recurrence.  It would be a little      early to  have renarrowing and not thrombosis.  I would continue on      current regimen.  I would like to set the patient up for cardiac      rehab so he can do things in an objective manner.  3. Hypertension.  The patient's blood pressure is still mildly      increased.  We will go up on his metoprolol for dyslipidemia.  Will      need follow-up on his fasting lipids.   I will set to see him back in 2-3 months.  Again we will be getting  information from rehab.     Pricilla Riffle, MD, [facility]  Electronically Signed    PVR/MedQ  DD: 11/03/2007  DT: 11/03/2007  Job #: [id number]  cc:   Corrie Mckusick, M.D.

## 2011-05-15 NOTE — Discharge Summary (Signed)
NAMEMarland [name]  FERLANDO, LIA NO.:  [id number]   MEDICAL RECORD NO.:  [id number]          PATIENT TYPE:  INP   LOCATION:  2041                         FACILITY:  [facility]   PHYSICIAN:  Madolyn Frieze. Jens Som, MD, FACCDATE OF BIRTH:  March 22, 1951   DATE OF ADMISSION:  08/28/2007  DATE OF DISCHARGE:  09/02/2007                               DISCHARGE SUMMARY   PRIMARY CARDIOLOGIST:  Pricilla Riffle, MD, [facility].   PRIMARY CARE Rosabell Geyer:  Dr. Phillips Odor.   DISCHARGE DIAGNOSIS:  Hypersensitivity reaction to Plavix and Ticlid  with rash.   SECONDARY DIAGNOSES:  1. Coronary artery disease, status post non-ST-elevation myocardial      infarction in August 2008, with placement of three Promus drug-      eluting stents.  2. Hypertension.  3. Hyperlipidemia.  4. Tobacco abuse  5. History of recurrent bronchitis.  6. History of avascular necrosis, status post right hip replacement      approximately 7 years ago.   ALLERGIES:  1. PENICILLIN.  2. He is intolerant to PRAVACHOL, LESCOL, and ZETIA, which cause      muscle aches and fatigue.  3. He has developed a rash with PLAVIX and TICLID.   PROCEDURES:  None.   HISTORY OF PRESENT ILLNESS:  A 60 year old Caucasian male with a recent  admission to [facility] from August 18, 2007 to August 21, 2007  following non-ST-elevation MI with drug-eluting stent placement in the  left anterior descending artery and second diagonal.  He was discharged  August 21, 2007, then followed up with Dr. Tenny Craw on August 25, 2007, with  complaints of a rash.  It was felt that this was most likely secondary  to Plavix, and therefore his Plavix was discontinued, and instead he was  placed on Ticlid therapy.  He was also given a Depo-Medrol injection and  steroid dose pack, with p.r.n. Atarax.  Unfortunately, his rash  continued to worsen on Ticlid, and he presented to the [facility] ED on  August 28, 2007 for further evaluation.   HOSPITAL COURSE:  The  patient was admitted to the [facility] Cardiology  Service and initially was placed on heparin, Pletal, and Coumadin  therapy.  However, after discussion with my interventional colleagues,  the decision was made to discontinue the Coumadin.  Dr. Tenny Craw discussed  [name] [name] case with Dr. Samuella Cota at [facility] as well as  with Dr. Lorin Picket, an allergist here in [place], and it was felt that  Mr. [name] [name] would benefit most from high-dose steroids at this time as  well as b.i.d. aspirin and Lovenox.  It was not felt that worsening of  rash while on Ticlid was necessarily a result of Ticlid, and that it may  have just been the natural course of his reaction to Plavix.  On high-  dose steroids, his rash and itching improved, and Ticlid was resumed on  August 31, 2007.  Unfortunately, on September 01, 2007, he noted  worsening of itching rash, and therefore his Ticlid was discontinued.  His Solu-Medrol was converted to oral prednisone, with a plan for a  slow  taper over the span of the next 2 weeks.  He will also continue on  b.i.d. 1 mg/kg Lovenox at home, as well as b.i.d. aspirin.  He will  follow up with Dr. Tenny Craw later this week, on September 5, with eventual  plans for Plavix desensitization.   DISCHARGE LABORATORIES:  Hemoglobin 11.7, hematocrit 34.6, WBC 18.7,  platelets 260, MCV 93.3.  Sodium 139, potassium 4.5, chloride 107, CO2  26, BUN 17, creatinine 0.96, glucose 145.  PT 14.5, INR 1.1, total  bilirubin 0.5, alkaline phosphatase 67, AST 35, ALT 15, albumin 3.3,  calcium 8.8.  Urinalysis was negative.  Sed rate was 13.   DISPOSITION:  The patient is being discharged home today in good  condition.   FOLLOW-UP PLANS AND APPOINTMENTS:  He is to follow up with Dr. Dietrich Pates on September 05, 2007 at 2:15 p.m..  He will have a complete blood  count checked on September 04, 2007 in our [place] office.  He is  asked to follow up with Dr. Phillips Odor as previously scheduled.    DISCHARGE MEDICATIONS:  1. Prednisone 20 mg taper, as directed.  2. Aspirin 325 mg b.i.d.  3. Lovenox 80 mg b.i.d.  4. Hydroxyzine hydrochloride 50 mg q.i.d. p.r.n.  5. Lipitor 40 mg at bedtime.  6. Nitroglycerin 0.4 mg sublingual p.r.n. chest pain.   OUTSTANDING LABORATORIES AND STUDIES:  CBC on Thursday September 04, 2007.   DURATION OF DISCHARGE ENCOUNTER:  90 minutes, including physician time.      Nicolasa Ducking, ANP      Madolyn Frieze. Jens Som, MD, [facility]  Electronically Signed    CB/MEDQ  D:  09/02/2007  T:  09/02/2007  Job:  [id number]   cc:   Dr. Phillips Odor

## 2011-05-15 NOTE — Assessment & Plan Note (Signed)
[facility] HEALTHCARE                                 ON-CALL NOTE   BROGEN, DUELL                     MRN:          [id number]  DATE:08/25/2007                            DOB:          12-07-1951    PRIMARY CARDIOLOGIST:  Dr. Dietrich Pates.   I received a page through the answering service at 7:58 this morning  from Mr. Cavitt. I returned a call at [phone]. He states allergic  reaction to medications. When I called him back at home, apparently he  has had this allergic reaction since he was here for a myocardial  infarction and discharged home on August 21. He decided this became an  acute problem this morning and needed to be addressed. Apparently was  discharged home on metoprolol, Plavix, and aspirin after an myocardial  infarction and intervention. He states that he feels sure the metoprolol  was giving him a reaction and he is breaking out in hives and he has  stopped taking it. First I asked him if he was having any trouble  swallowing, or any shortness of breath, or chest discomfort. He had  denied the above. He states he just took some Benadryl but he is going  to drive himself to our [place] office to get checked out. I  instructed him that this not an acute problem as he is asymptomatic  other than the hives and pruritus, for him to call the office as the  office is opening now at 8 a.m., and see if he can get an appointment  with Dr. Dietrich Pates for further evaluation. Mr. Ruppe stated that he  would.     Dorian Pod, ACNP  Electronically Signed    MB/MedQ  DD: 08/25/2007  DT: 08/25/2007  Job #: [id number]

## 2011-05-15 NOTE — Assessment & Plan Note (Signed)
[place] HEALTHCARE                            CARDIOLOGY OFFICE NOTE   LAUREL, SMELTZ                    MRN:          [id number]  DATE:08/24/2007                            DOB:          [birth year]    Mr. [name] [name] called Sunday morning on August 24 at about 6:30 a.m. to  report that he has developed a pruritic rash on his back and in areas  where he sweats that started on Friday after he left the hospital. He  has also noticed that after he takes his metoprolol he gets lightheaded,  has some chest pressure and notices a very slow heart rate. He was  recently in the hospital and received PCI on Tuesday and went home on  Thursday. All of his medications are new and include Toprol, aspirin and  Plavix, Lipitor, nitroglycerine and Chantix. He has also been having  trouble sleeping. I advised him to decrease his Toprol XL to 25 mg from  50 mg because of his bradycardia and lightheadedness and to stop his  Chantix in case that was contributing to his rash or his sleeping  difficulties and advised him to call next week to followup with Dr.  Dietrich [name].      [name] Ports, MD   Electronically Signed     ______________________________  [name] [name]. Dietrich Pates, MD, [facility]   DWM/MedQ  DD: 08/24/2007  DT: 08/24/2007  Job #: [id number]

## 2011-05-15 NOTE — Assessment & Plan Note (Signed)
[facility]                       [place] CARDIOLOGY OFFICE NOTE   DANTE, ROUDEBUSH                     MRN:          [id number]  DATE:08/25/2007                            DOB:          Jul 24, 1951    PATIENT IDENTIFICATION:  [name] [name] is a 60 year old gentleman who was  recently discharged from [facility]. He has a history of CAD and  underwent PTCA stent to the LAD and diagonal with a drug-eluting stent.  This was a bifurcation lesion involving the second diagonal and two  stents were placed. The patient was discharged home on the 21st. He now  presents today before his first visit because of a rash. The patient  says it has gotten worse since discharge. He said it actually may have  started before he left.   Breathing is okay, denies wheezing. No chest pain.   CURRENT MEDICATIONS:  1. Plavix 75 daily.  2. Aspirin 325 daily.  3. Metoprolol ER 50 daily.  4. Lipitor 80.   PHYSICAL EXAMINATION:  The patient is in no acute distress.  Blood pressure is 128/80, pulse is 76, weight 169.  NECK:  JVP is normal. Note some mild urticarial lesions.  LUNGS:  Clear to auscultation without wheezes.  BACK:  Diffuse urticarial rash confluent along back.  CARDIAC:  Regular rate and rhythm. S1, S2, no S3.  ABDOMEN/CHEST:  Again rash noted less pronounced than back. No  hepatomegaly.  EXTREMITIES:  Right groin without bruit or hematoma. No lower extremity  edema.   IMPRESSION:  1. Rash most likely from Plavix. Discussed with Shelby Dubin. Will give      him Depo-Medrol injection and a steroid Dosepak. Have taken him off      Plavix and placed him on Ticlid 250 b.i.d. Continue on aspirin.      Will need to get bimonthly CBC platelets. He was also given a      prescription for Atarax. If he has anymore problems should call.  2. Coronary artery disease. As noted above will set in a referral for      cardiac rehab. He says he was very active before he  went into the      hospital. Again by cath note it looked like he should be on      indefinite Plavix/Ticlid, will need to review.  3. Dyslipidemia. On 80 of Lipitor and will need a fasting lipid panel      8 weeks from starting.      Note his LDL in the hospital is 187, HDL of 28, total cholesterol      252. Again may benefit with cardiac rehab.   I will have him keep his appointment early in September and will see him  on the 8th.     Pricilla Riffle, MD, [facility]  Electronically Signed    PVR/MedQ  DD: 08/25/2007  DT: 08/26/2007  Job #: [id number]

## 2011-05-15 NOTE — Discharge Summary (Signed)
NAME:  [name] [name], [name] [name] NO.:  [id number]   MEDICAL RECORD NO.:  [id number]          PATIENT TYPE:  INP   LOCATION:  2903                         FACILITY:  [facility]   PHYSICIAN:  Everardo Beals. Juanda Chance, MD, FACCDATE OF BIRTH:  05/29/1951   DATE OF ADMISSION:  01/27/2008  DATE OF DISCHARGE:  01/28/2008                         DISCHARGE SUMMARY - REFERRING   SUMMARY OF HISTORY:  [name] is a 60 year old white male who was  admitted to CCU yesterday afternoon for Plavix desensitization.  Apparently after receiving drug eluting stents to his LAD he was placed  on Plavix and then Ticlid but developed a rash with both medications.  Since these medications were discontinued he was placed on subcu Lovenox  for anticoagulation.  Dr. Juanda Chance after reviewing with allergist and  pharmacy developed desensitization protocol thus he was admitted for  this.   PAST MEDICAL HISTORY:  Is notable for hyperlipidemia.  It is noted prior  to admission he has not been on any steroids or antihistamines and his  Toprol has been discontinued since January 18, 2008.   LABORATORY:  Admission H and H is 14.0 and 41.7, normal indices,  platelets 265, WBC 7.0, PTT 36, PT 13.4, sodium 138, potassium 3.9, BUN  12, creatinine 0.8, glucose 91, normal LFTs.   HOSPITAL COURSE:  The patient was admitted to 2903.  Pharmacy assisted  with Plavix desensitization.  This was performed on the evening of  January 27 without difficulty.  Overnight he did not have any difficulty  and after review by Dr. Juanda Chance it was felt that he would be discharged  home.   DISCHARGE DIAGNOSES:  1. Plavix and Ticlid allergy.  2. Status post Plavix desensitization.  3. History of coronary artery disease with three drug eluting stents      at the LAD (left anterior descending) bifurcation.  4. History of hyperlipidemia.  5. Hypertension.   DISPOSITION:  The patient is discharged home.  Dr. Juanda Chance has asked him  to continue his  subcu Lovenox 80 mg daily until Sunday.  He was also  asked to continue Plavix 75 mg daily, Cozaar 50 mg daily, aspirin 325 mg  daily, Lipitor 80 mg daily at bedtime, nitroglycerin 0.4 as needed.  He  was asked not to take his beta-blocker at this time.  He was asked to  create a blood pressure diary and to bring all medications and his blood  pressure diary to all appointments.   WOUND CARE:  Is not applicable.   ACTIVITY:  No restrictions.   FOLLOWUP:  He will follow up with Dr. Tenny Craw in [place] on February 05, 2008, at 2 p.m.   Dictating discharge time 25 minutes.      Joellyn Rued, PA-C      Bruce R. Juanda Chance, MD, [facility]  Electronically Signed    EW/MEDQ  D:  01/28/2008  T:  01/28/2008  Job:  [id number]   cc:   Pricilla Riffle, MD, [facility]  Corrie Mckusick, M.D.

## 2011-05-15 NOTE — Cardiovascular Report (Signed)
NAME:  [name] [name], [name] [name] NO.:  [id number]   MEDICAL RECORD NO.:  [id number]          PATIENT TYPE:  INP   LOCATION:  2907                         FACILITY:  [facility]   PHYSICIAN:  Everardo Beals. Juanda Chance, MD, FACCDATE OF BIRTH:  1951-09-05   DATE OF PROCEDURE:  08/19/2007  DATE OF DISCHARGE:                            CARDIAC CATHETERIZATION   HISTORY:  [name] [name] is 60 years old and has no prior history of known  heart disease.  He was admitted the night of the 18th with chest pain by  Dr. Lalla Brothers, our cardiology fellow.  His troponins returned positive,  and he had recurrent chest pain the next morning and was seen by Dr.  Dietrich Pates.  He was taken urgently to the catheterization lab for  intervention.   PROCEDURE:  The procedure was performed via the right femoral artery  using arterial sheath and 6-French preformed coronary catheters.  A  front wall arterial puncture was performed, and Omnipaque contrast was  used.  The venous sheath was placed because we had difficulty getting a  second IV.  After completion of the diagnostic study and after  discussing the situation [name] [name], we made a decision to proceed with  intervention on a complex bifurcation lesion involving the LAD and the  diagonal branch.  The patient given Angiomax bolus and infusion and had  been previously been loaded with aspirin, and he was randomized in the  [place] trial with either Plavix or Alla Feeling study drug.  We used a Q4  7-French guiding catheter with side holes and passed a Prowater wire  down the diagonal branch and down the LAD.  We predilated the diagonal  branch with a 2.25 x 12-mm Maverick, performing 1 inflation up to 8  atmospheres for 20 seconds.  We had planned not to stent next to the  ostium.  We had hoped not to stent next to the ostium to avoid a dual  stent technique for the bifurcation lesion.  The tightest lesion was in  the proximal diagonal branch but did not involve the  ostium.  The  predilatation was opened in a small dissection.  We then stented the  lesion with a 2.5 x 12-mm probe and stent deploying this with 1  inflation of 8 atmospheres for 20 seconds.  We postdilated with a 2.5 x  8-mm Quantum Maverick performing 1 inflation up to 16 atmospheres for 30  seconds.  We then passed a 2.75 x 28-mm Promed stent across the second  and third diagonal branches and across the lesion in the proximal LAD  and deployed this with 1 inflation of 14 atmospheres for 20 seconds.  We  then post dilated this.  We then removed the wire from the second  diagonal branch and re-routed the wire down the second diagonal branch  through the side struts of the stent.  We then postdilated the stent  with a 3 x 20-mm Quantum Maverick performing 2 inflations up to 16  atmospheres for 30 seconds.  We then went in with a 2.25 x 20-mm  Maverick and dilated the ostium of the  side branch.  This resulted in a  suboptimal result, and it felt like we had to stent the vessel.  We then  passed a 2.5 x 8-mm Promed stent through the stent in the LAD into the  diagonal branch with just slight overhang into the LAD.  We also passed  a 3 x 20-mm Maverick balloon into the LAD.  We then deployed the stent  in the diagonal branch with 1 inflation up to 12 atmospheres for 20  seconds.  We then did a kissing balloon with the stent balloon and the  stent in the side branch and the 3 x 20-mm balloon in the LAD and  performed simultaneous inflations up to 10 atmospheres for 30 seconds.  The balloon was then removed, and the diagnostic studies were then  performed through the guiding catheter.  It was a long procedure lasting  2 hours and 1 minute, but the patient tolerated the procedure well and  left the laboratory in satisfactory condition.   RESULTS:  Left main coronary artery had a 30% distal stenosis.   Left anterior descending artery rise to a small diagonal branch, a  moderate-sized diagonal  branch and a large diagonal branch.  There is a  septal perforator after the first small diagonal branch.  There was 80%  narrowing in the proximal LAD between the first and second diagonal  branch between the two largest diagonal branches.  There was 70% ostial  stenosis in the first diagonal branch and 95% stenosis in the proximal  portion of second diagonal branch.   The circumflex artery gave rise to a ramus branch, a marginal branch and  a posterolateral branch.  There was 30% ostial narrowing in the  circumflex artery.   The right coronary artery was a dominant vessel that gave rise to two  right ventricular branches, a posterior descending branch and two  posterolateral branches.  There were tandem 30% stenosis in the  midvessel.   The left ventricular ventriculogram performed in the RAO projection  showed good wall motion with no areas of hypokinesis.  Estimated  ejection fraction was 60%.   Following bifurcation and stenting of the LAD and the second large  diagonal branch, the stenosis in the LAD improved from 80% to 0%, and  the stenosis in the diagonal branch improved from 95% to 10%.  The first  diagonal branch was pinched but remained patent.   CONCLUSION:  1. Non-ST elevation myocardial infarction with 30% distal stenosis in      the left main coronary artery, 80% proximal stenosis in the LAD,      70% stenosis in the first and 95% stenosis in the second diagonal      branch of the LAD, 30% ostial narrowing of the circumflex artery,      30% narrowing in the mid right coronary and normal LV function.  2. Successful PCI of a bifurcation lesion involving the LAD and second      diagonal branch of the LAD with two drug-eluting stents in the      diagonal branch and one drug-eluting stent in the LAD (reverse      crush) with improvement in diagonal stenosis from 95% to 10% and      improvement in the LAD stenosis from 80% to 0%.   DISPOSITION:  The patient admitted for  further observation.  With  multiple stents and a bifurcation lesion, the patient will be at  increased risk for stent thrombosis, and she  will remain on Plavix long-  term.      Bruce Elvera Lennox Juanda Chance, MD, [facility]  Electronically Signed     BRB/MEDQ  D:  08/19/2007  T:  08/20/2007  Job:  [id number]   cc:   Phillips Odor, M.D.  Gerrit Friends. Dietrich Pates, MD, [facility]  Bruce R. Juanda Chance, MD, [facility]

## 2011-05-15 NOTE — Cardiovascular Report (Signed)
NAME:  [name] [name], [name] [name] NO.:  [id number]   MEDICAL RECORD NO.:  [id number]          PATIENT TYPE:  OIB   LOCATION:  1963                         FACILITY:  [facility]   PHYSICIAN:  Everardo Beals. 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  Four months ago, he had a complex  bifurcation stenting procedure of the LAD and a large diagonal branch  with 2 drug-eluting stents placed in the diagonal branch and 1 drug-  eluting stent in the LAD with a reverse crush technique.  He  subsequently developed an allergy to Plavix with rash and intolerance to  Ticlid.  He has since been on subcutaneous Lovenox.  Pricilla Riffle, MD,  [facility], has been following him, and I recently saw him also in  consultation to help decide the best approach for long-term treatment.  We decided to bring him for reevaluation since I was concerned about his  left main and trifurcation disease on his previous study to see if  surgery might be a good option.   PROCEDURE:  The procedure was performed with a right femoral arterial  sheath and 4 [place] __________ coronary catheters.  A front wall  arterial puncture was performed and Omnipaque contrast was used.  The  patient tolerated the procedure well and left the laboratory in  satisfactory condition.   RESULTS:  1. The left main coronary artery is free of disease.  2. The left anterior descending artery __________ had an area of 30%      narrowing at the ostium.  The stent in the LAD and at the      bifurcation of the second diagonal branch was of patent with 40%      narrowing just distal to the takeoff the diagonal branch.  The      diagonal branch stent was also patent with 30% narrowing at the      ostial portion.  There was another diagonal branch, which  arose      just before the large diagonal branch, which appeared to be free of      major obstruction.  There was 50% narrowing in the mid LAD after      the stent.  3. The circumflex artery gave rise to a ramus branch, a marginal      branch, and a posterolateral branch.  There was 40% ostial stenosis      of the circumflex artery.  4. The right coronary artery was a moderate-sized vessel and gave rise      to 30% narrowing in the proximal vessel, 30% narrowing in the      midvessel, and 50% narrowing in the distal vessel.  The posterior      descending and posterolateral branches were free of disease.  5. The left ventriculogram done in the left RAO projection showed good      wall motion with no areas of hypokinesis.  Estimated fraction of      60%.  6. The aortic pressure was 131/70 with a mean of 96 and left ventricle      pressure of 131/10.   CONCLUSION:  Coronary artery status post prior percutaneous coronary  intervention in August 2008 with 30% ostial narrowing in the LAD, 40%  narrowing within the stent in the proximal LAD, and 30% narrowing within  the stent in the diagonal branch of the LAD, and 50% narrowing in the  mid LAD, 40% narrowing in the ostium of the circumflex artery, 30%  proximal and 50% distal stenosis in the right coronary, and normal LV  function.   RECOMMENDATIONS:  The the bifurcation stent was quite good and the  lesions in the ostium of the LAD and circumflex artery appear only mild.  Based on his coronary anatomy, I do not think further revascularization  is indicated at the present time.  I think the best option would be to  see if we can arrange for [name] [name] to have desensitization of his of  Plavix..  We may need to make a referral to one of the medical centers  to accomplished this.      Bruce Elvera Lennox Juanda Chance, MD, [facility]  Electronically Signed     BRB/MEDQ  D:  12/30/2007  T:  12/30/2007  Job:  [id number]   cc:   Corrie Mckusick, M.D.  Pricilla Riffle, MD, [facility]

## 2011-05-15 NOTE — Assessment & Plan Note (Signed)
[facility]                        CARDIOLOGY OFFICE NOTE   REHMAN, LEVINSON                     MRN:          [id number]  DATE:05/07/2008                            DOB:          May 11, 1951    IDENTIFICATION:  Mr. Blizzard is a gentleman who I followed in cardiology  clinic.  He has a history of CAD (status post PTCA/stents to the LAD and  diagonal back in August 2008).  Post-procedure complicated by Plavix  allergy which he was eventually desensitized to.  He was last seen in  cardiology by me back in February.  He has been seen in the interval by  Dr. Juanda Chance and underwent the desensitization.   Since then, he has been doing fairly well.  He denies itching.  His dry  skin is improving. No chest pain other than occasional episodes not  associated with activity.  He thinks maybe indigestion.  He is active  mowing the lawn.  Notes no problems with chest pressure or shortness of  breath.   CURRENT MEDICATIONS:  1. Aspirin 325.  2. Lipitor 40.  3. Plavix 75.  4. Cozaar 100.   PHYSICAL EXAMINATION:  GENERAL:  The patient is in no distress.  VITAL SIGNS:  Blood pressure 139/101, pulse is 73 and regular, weight  182.  LUNGS:  Clear without rales or wheezes.  CARDIAC:  Regular rate and rhythm, S1-S2, no S3, no murmurs.  ABDOMEN:  Benign.  EXTREMITIES:  No edema.   IMPRESSION:  1. Coronary artery disease seems to be stable.  I am not convinced the      intermittent spells are angina.  He does have nitro to take if he      has a significant spell.  Keep on the same medicines including      Plavix.  2. Hypertension, not good control on Cozaar 100.  We will add back      Toprol XL 25 b.i.d. and followup in 4-6 weeks.  3. Dyslipidemia.  Last lipid panel done on April 29, 2008.  LDL was      104.  I would like to switch him to Vytorin 10/40 and check a Lipo-      Met in 8 weeks with an AST.   FOLLOW UP:  I will set to see him back in 4-6  weeks regarding his blood  pressure.   ACTIVITY:  Continue activities as tolerated.     Pricilla Riffle, MD, [facility]  Electronically Signed    PVR/MedQ  DD: 05/07/2008  DT: 05/07/2008  Job #: [id number]   cc:   Phillips Odor, MD

## 2011-05-15 NOTE — Assessment & Plan Note (Signed)
[facility]                        CARDIOLOGY OFFICE NOTE   [name] [name], [name] [name]                     MRN:          [id number]  DATE:10/23/2007                            DOB:          July 17, 1951    IDENTIFICATION:  [name] [name] is a 60 year old gentleman who I saw back on  the 13th.  History of CAD (status post PTCA stent x3; drug-eluting to  LAD and diagonal).  Complicated by a PLAVIX ALLERGIC REACTION.  He is  now on Ticlid therapy.   When I saw him last I took him off Lovenox, double dose aspirin and  continued on the Ticlid, I increased his metoprolol to 50.  He is back  on Lipitor, taking 40.   He denies chest pain.  He notes some tingly sensation since he has been  on the Ticlid.   The patient came in to clinic a couple days ago troubled by work.  Blood  pressure at that time was elevated at 165/99.   CURRENT MEDICATIONS:  1. Lipitor 40.  2. Metoprolol 25 b.i.d.  3. Aspirin 325 daily.  4. Ticlid 250 b.i.d.  5. Pepcid 40.  6. Ambien.   REVIEW OF SYSTEM:  Patient also notes an upset stomach with Ticlid.  He  takes the Pepcid prior and this seems to help.   He denies rash, no wheezing.   PHYSICAL EXAM:  On exam, the patient is in no distress.  Blood pressure  130/90, on my check also 150/92, pulse is 72 and regular, weight 178,  stable.  LUNGS:  Clear.  CARDIAC EXAM:  Regular rate and rhythm, S1 S2.  No S3.  No murmurs.  ABDOMEN:  Benign.  EXTREMITIES:  No edema.  SKIN:  Without rash.   IMPRESSION:  1. Coronary artery disease.  Clinically doing well on the Ticlid,      aspirin for now.  Continue to check twice weekly CBC platelets.  2. Hypertension, not optimally controlled.  Will increase his      metoprolol to 50 twice daily and see him in 6 weeks.  3. Dyslipidemia.  Will need fasting lipids in December.   I will set to see the patient back in 6 weeks, sooner if problems  develop.     Pricilla Riffle, MD, [facility]  Electronically Signed    PVR/MedQ  DD: 10/23/2007  DT: 10/24/2007  Job #: [id number]   cc:   Dr. Phillips Odor

## 2011-05-15 NOTE — Assessment & Plan Note (Signed)
[facility]                       [name] [name] CARDIOLOGY OFFICE NOTE   [name] [name], [name] [name]                     MRN:          [id number]  DATE:09/26/2007                            DOB:          July 19, 1951    REFERRING PHYSICIAN:  Dr. Corrie Mckusick.   IDENTIFICATION:  [name] [name] is 60 year old gentleman with history of CAD  (status post recent PTCA stent) x3, drug eluting to the LAD/diagonal.   Post procedure complicated by an allergic reaction.  He is now being  followed closely by Dr. Laurette Schimke in allergy for a steroid taper.   In talking to the patient today, plan is for him to go into clinic on  Tuesday to begin Ticlid.   Otherwise he denies chest pain, no significant shortness of breath.  He  is still having problems with some insomnia, he wakes up with muscle  cramping.  Note - a BMET was done that was unremarkable.  Potassium was  4.9 (eating a lot of bananas).   CURRENT MEDICATIONS:  1. Aspirin 650 daily.  2. Pepcid 40 daily.  3. Ambien q.h.s.  4. Prednisone taper (5 every other day).  5. Lovenox 80 b.i.d. SQ.   PHYSICAL EXAMINATION:  VITAL SIGNS:  The patient is in no distress.  Blood pressure in right arm 140/110, left arm 160/98, pulse is 78 and  regular.  Weight 178.  SKIN:  No rash. Bruising on abdomen near injection sites.  LUNGS:  Clear without wheezes or rales.  CARDIAC EXAM:  Regular rate and rhythm S1, S2, no murmurs.  ABDOMEN:  Benign.  Bruises as noted.  EXTREMITIES:  No edema.   IMPRESSION:  1. Coronary artery disease remains stable on aspirin and Lovenox.      Plan for Ticlid initiation.  2. Hypertension - will add back Metoprolol ER today at 50. I would      like to see him back in a couple of weeks.  3. Dyslipidemia - holding off on Lipitor until the drug situation has      stabilized.   Again, I will see him back in a couple of weeks.  Allergy followup as  noted above.   ADDENDUM:  Muscle cramps.  He has  tried magnesium oxide, I think a  tablet daily should not hurt him.  I discouraged him from taking  potassium supplements, not sure how he got those.  My be related to some increased fluid retention though he said he had  some cramping before. Distal pulses are good.     Pricilla Riffle, MD, [facility]  Electronically Signed    PVR/MedQ  DD: 09/26/2007  DT: 09/26/2007  Job #: [id number]   cc:   Corrie Mckusick, M.D.  Jessica Priest, M.D.

## 2011-05-18 NOTE — H&P (Signed)
NAME:  [name] [name], [name] [name]              ACCOUNT NO.:  [id number]   MEDICAL RECORD NO.:  [id number]          PATIENT TYPE:  AMB   LOCATION:                                FACILITY:  [facility]   PHYSICIAN:  Lionel December, M.D.    DATE OF BIRTH:  11-21-1951   DATE OF ADMISSION:  DATE OF DISCHARGE:  LH                                HISTORY & PHYSICAL   PRESENTING COMPLAINT:  Irregular bowel movements.  Patient interested in  colonoscopy.   HISTORY OF PRESENT ILLNESS:  [name] [name] is a 60 year old Caucasian male who  states he has had irregular bowel movements for five years but lately he is  more aware of this problem.  He states he does not have regular bowel  movements.  One day he may have as many as three formed to loose stools, but  then he may go 2-3 days without a bowel movement, then he has a normal bowel  movement or may be constipated.  He denies melena, rectal bleeding,  nocturnal diarrhea, abdominal pain, anorexia, weight loss.  He has dysphagia  with pills but not with liquids or solids, and he denies heart burn.  He has  been found to have IBS.  Dr. Phillips Odor has been after him to get a colonoscopy  for the past few years, but he is not interested until now.  He does  complain of back pain secondary to disk disease.  He uses OTCI back pain  secondary to disk disease and takes ibuprofen on a p.r.n. basis.   He is on ibuprofen 400 mg daily p.r.n.  Does not take any other medications.   He had tonsillectomy at age 83.  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   ALLERGIES:  PENICILLIN CAUSES COUGH AND TONGUE SWELLING.   FAMILY HISTORY:  Father died of prostate carcinoma at age 61.  Mother at 62  has advanced dementia and is at [facility].  One sister died of lung  carcinoma at age 77.  Another sister and brother are in good health.   SOCIAL HISTORY:  He is divorced.  He is single, has two children in good  health.  He has been working  at the [employer] office for the last 31 years.  He  is presently in the [place] office.  He has been smoking a half pack a  day for the last 25 years, but he does not drink alcohol.   PHYSICAL EXAMINATION:  GENERAL APPEARANCE:  Pleasant, well-developed, thin,  Caucasian male who is in no acute distress.  VITAL SIGNS:  He weighs 175 pounds.  He is 6 feet tall.  Pulse 72, blood  pressure 140/92, temperature 97.9.  HEENT:  Conjunctivae is pink.  Sclerae is nonicteric.  Oropharyngeal mucosa  is normal.  Dentition in satisfactory condition.  NECK:  No neck masses or thyromegaly noted.  Without bruits.  CARDIAC:  Regular rhythm.  Normal S1, S3.  No murmur or gallop noted.  LUNGS:  Clear to auscultation.  ABDOMEN:  Flat and soft without  tenderness, organomegaly or masses.  RECTAL:  Deferred.  He does not have clubbing or peripheral edema.   ASSESSMENT:  [name] [name] is a 60 year old Caucasian male with a few year history  of irregular bowel movements.  He may go from having diarrhea to  constipation.  He does not have any alarm symptoms.  I agree he has  irritable bowel syndrome.  He, however, needs to undergo colonoscopy both  for diagnostic and screening purposes.  Procedure and risks were reviewed  with the patient and he is agreeable.   Borderline diastolic blood pressure.  This will be checked again at the time  of colonoscopy.   PLAN:  1.  Benefiber 4 g daily.  Keep stool diary until the time of colonoscopy      which would be after Christmas.  2.  I have reviewed the procedure and risks with the patient and he is      agreeable.      Lionel December, M.D.  Electronically Signed     NR/MEDQ  D:  12/10/2005  T:  12/10/2005  Job:  [id number]   cc:   Corrie Mckusick, M.D.  Fax: [phone]

## 2011-05-18 NOTE — Op Note (Signed)
NAME:  [name] [name], [name] [name] NO.:  [id number]   MEDICAL RECORD NO.:  [id number]          PATIENT TYPE:  AMB   LOCATION:  [facility]                          FACILITY:  [facility]   PHYSICIAN:  Cindee Salt, M.D.       DATE OF BIRTH:  06-05-1951   DATE OF PROCEDURE:  11/16/2004  DATE OF DISCHARGE:                                 OPERATIVE REPORT   PREOPERATIVE DIAGNOSIS:  Mass right middle finger.   POSTOPERATIVE DIAGNOSIS:  Mass right middle finger.   OPERATION:  Excision mass right middle finger.   SURGEON:  Cindee Salt, M.D.   ASSISTANT:  ___________.   ANESTHESIA:  Forearm-based IV regional.   HISTORY:  The patient is a 60 year old male with a large mass on the dorsal  aspect of the distal interphalangeal joint right middle finger.   PROCEDURE:  The patient was brought to the operating room, where a forearm-  based IV regional anesthetic was carried out without difficulty.  He was  prepped using Betadine scrub and solution with the right arm free.  General  anesthetic was afforded.  Following adequate anesthesia, tourniquet placed  on the forearm was inflated to 200 mmHg.  The curvilinear incision was made  over the distal interphalangeal joint, carried down through subcutaneous  tissue.  A large pearly white mass was immediately encountered.  With blunt  and sharp dissection, this was dissected free.  It was not opened.  The  entire specimen was sent to pathology.  Significant extra skin was present.  This was excised from the distal aspect, allowing the proximal flap to be  rotated distally for complete closure without any tension, without any  significant excess skin.  This was done after irrigation.  The closure was  done with interrupted  5-0 chromic sutures.  A sterile compressive dressing and splint to the  finger was applied.  The patient tolerated the procedure well and was taken  to the recovery room for observation in satisfactory condition.  He was  discharged  home, to return to the [facility] in [place] in one week, on  Darvocet-N 100.       GK/MEDQ  D:  11/16/2004  T:  11/16/2004  Job:  [id number]   cc:   Cindee Salt, M.D.  [address]  [place]  [place] [address]  Fax: [phone]

## 2011-06-14 ENCOUNTER — Telehealth: Payer: Self-pay | Admitting: Internal Medicine

## 2011-06-14 NOTE — Telephone Encounter (Signed)
Pt wants to know if he can take a K+ supplement.

## 2011-06-14 NOTE — Telephone Encounter (Signed)
Patient wants to start taking a potassium supplement b/c it was low in the hospital around 3.2. I did advise patient that he could eat a banana everyday or increase his intake of potassium enriched foods. He is currently taking Hyzaar 100/25 daily. Will route to Dr. Tenny Craw to review.

## 2011-07-25 ENCOUNTER — Encounter: Payer: Self-pay | Admitting: Gastroenterology

## 2011-07-25 ENCOUNTER — Ambulatory Visit (INDEPENDENT_AMBULATORY_CARE_PROVIDER_SITE_OTHER): Payer: Federal, State, Local not specified - PPO | Admitting: Gastroenterology

## 2011-07-25 DIAGNOSIS — R1310 Dysphagia, unspecified: Secondary | ICD-10-CM | POA: Insufficient documentation

## 2011-07-25 NOTE — Assessment & Plan Note (Signed)
Solid dysphagia most likely 2o to peptic stricture, less likely eso or stomach CA.  EGD/DIL, ? bX in the future. Risks and benefits explained. OPV in 4 mos. Pt will likely need qd PPI.

## 2011-07-25 NOTE — Progress Notes (Signed)
Cc to PCP 

## 2011-07-25 NOTE — Progress Notes (Signed)
Subjective:    Patient ID: [name] [name], male    DOB: 12/01/1951, 60 y.o.   MRN: [id number]  PCP: Phillips Odor  HPI Pt seen and evaluated for CP in the ED [facility] 2012. [id number] and given Rx for Protonix. Took it regularly for first couple of weeks and then prn. It helped. If eats certain foods feels like it's getting stuck, especially w/ chicken breast. If he watches what he eats, no geartburn. If eats beans, seems to make CP flare. No nausea, or vomiting, abd pain, diarrhea, or constipation. No weight loss or change in his bowel habits. Bms: q1-2days. WHEN HE WAS YOUNGER HE'S 4-5 DAYS W/O HAVING A BM. TCS NUR LAST YEAR AT [facility], no problems with the sedation. Tasting blood from gum disease. No EGD ever.  Past Medical History  Diagnosis Date  . CAD (coronary artery disease)   . HTN (hypertension)   . Hyperlipidemia   . Avascular necrosis of bone of hip     right  . Anxiety   . Paresthesias     Past Surgical History  Procedure Date  . Tonsillectomy age 48  . Total hip arthroplasty age 57    right hip for avascular necrosis of hip and spine  . Colonoscopy 2011 NUR [facility]    Allergies  Allergen Reactions  . Clopidogrel Bisulfate   . Penicillins     Current Outpatient Prescriptions  Medication Sig Dispense Refill  . ALPRAZolam (XANAX) 0.5 MG tablet Take 0.5 mg by mouth at bedtime as needed.        Marland Kitchen aspirin 325 MG tablet Take 325 mg by mouth daily.        Marland Kitchen atorvastatin (LIPITOR) 40 MG tablet Take 40 mg by mouth daily.        . calcium carbonate (TUMS - DOSED IN MG ELEMENTAL CALCIUM) 500 MG chewable tablet Chew 1 tablet by mouth as needed.        Marland Kitchen losartan-hydrochlorothiazide (HYZAAR) 100-25 MG per tablet Take 1 tablet by mouth daily.  90 tablet  3  . nitroGLYCERIN (NITROSTAT) 0.4 MG SL tablet Place 0.4 mg under the tongue every 5 (five) minutes as needed.        . pantoprazole (PROTONIX) 40 MG tablet Take 40 mg by mouth daily.        Marland Kitchen losartan-hydrochlorothiazide (HYZAAR)  100-25 MG per tablet Take 1 tablet by mouth daily.  90 tablet  3    Family History  Problem Relation Age of Onset  . Dementia Mother   . Prostate cancer Father 74  . Lung cancer Sister 93  . Colon cancer Neg Hx   . Colon polyps Neg Hx     History   Social History  . Marital Status: Single   Social History Main Topics  . Smoking status: Former Smoker -- 1.0 packs/day    Types: Cigarettes  . Smokeless tobacco: Not on file  . Alcohol Use: No  . Drug Use: No    Review of Systems  All other systems reviewed and are negative.       Objective:   Physical Exam  Vitals reviewed. Constitutional: He is oriented to person, place, and time. He appears well-developed and well-nourished. No distress.  HENT:  Head: Normocephalic and atraumatic.  Mouth/Throat: Oropharynx is clear and moist. No oropharyngeal exudate.  Eyes: Pupils are equal, round, and reactive to light.  Neck: Normal range of motion. Neck supple.  Cardiovascular: Normal rate, regular rhythm and normal heart sounds.   Pulmonary/Chest:  Effort normal and breath sounds normal.  Abdominal: Soft. Bowel sounds are normal. He exhibits no distension and no mass. There is no tenderness.  Musculoskeletal: He exhibits no edema.  Lymphadenopathy:    He has no cervical adenopathy.  Neurological: He is alert and oriented to person, place, and time.  Psychiatric: He has a normal mood and affect.          Assessment & Plan:

## 2011-07-25 NOTE — Progress Notes (Signed)
Reminder in epic to follow up in 4 months °

## 2011-07-26 ENCOUNTER — Encounter: Payer: Self-pay | Admitting: Internal Medicine

## 2011-08-22 ENCOUNTER — Telehealth: Payer: Self-pay

## 2011-08-22 NOTE — Telephone Encounter (Signed)
Called pt, LMOM for him to call to get set up for the EGD/Dil per Dr. Darrick Penna, for solid dysphagia.

## 2011-08-24 NOTE — Telephone Encounter (Signed)
Spoke with pt and he wants to wait until Oct. Told him I will call back later to schedule for Oct.

## 2011-08-27 ENCOUNTER — Other Ambulatory Visit: Payer: Self-pay | Admitting: Sports Medicine

## 2011-08-27 ENCOUNTER — Ambulatory Visit
Admission: RE | Admit: 2011-08-27 | Discharge: 2011-08-27 | Disposition: A | Discharge status: Home / Self Care | Payer: Federal, State, Local not specified - PPO | Source: Ambulatory Visit | Attending: Sports Medicine | Admitting: Sports Medicine

## 2011-08-27 DIAGNOSIS — M542 Cervicalgia: Secondary | ICD-10-CM

## 2011-09-11 ENCOUNTER — Other Ambulatory Visit: Payer: Self-pay

## 2011-09-11 ENCOUNTER — Telehealth: Payer: Self-pay

## 2011-09-11 NOTE — Telephone Encounter (Signed)
EGD/ED 

## 2011-09-11 NOTE — Telephone Encounter (Signed)
Instructions and appt time mailed.

## 2011-09-11 NOTE — Telephone Encounter (Signed)
Pt still has difficulty swallowing chicken and meats and swallowing some pills.  Gastroenterology Pre-Procedure Form  Request Date: 09/11/2011        Requesting Physician: Dr. Darrick Penna     PATIENT INFORMATION:  [name] [name] is a 60 y.o., male (DOB=1951-08-09).  PROCEDURE: Procedure(s) requested: endoscopy and dilation Procedure Reason: dysphagia  PATIENT REVIEW QUESTIONS: The patient reports the following:   1. Diabetes Melitis: no 2. Joint replacements in the past 12 months: no 3. Major health problems in the past 3 months: no 4. Has an artificial valve or MVP:no 5. Has been advised in past to take antibiotics in advance of a procedure like teeth cleaning: no}    MEDICATIONS & ALLERGIES:    Patient reports the following regarding taking any blood thinners:   Plavix? no Aspirin?yes  Coumadin?  no  Patient confirms/reports the following medications:  Current Outpatient Prescriptions  Medication Sig Dispense Refill  . ALPRAZolam (XANAX) 0.5 MG tablet Take 0.5 mg by mouth at bedtime as needed.        Marland Kitchen aspirin 325 MG tablet Take 325 mg by mouth daily.        Marland Kitchen atorvastatin (LIPITOR) 40 MG tablet Take 40 mg by mouth daily.        . calcium carbonate (TUMS - DOSED IN MG ELEMENTAL CALCIUM) 500 MG chewable tablet Chew 1 tablet by mouth as needed.        Marland Kitchen losartan-hydrochlorothiazide (HYZAAR) 100-25 MG per tablet Take 1 tablet by mouth daily.  90 tablet  3  . nitroGLYCERIN (NITROSTAT) 0.4 MG SL tablet Place 0.4 mg under the tongue every 5 (five) minutes as needed.        Marland Kitchen losartan-hydrochlorothiazide (HYZAAR) 100-25 MG per tablet Take 1 tablet by mouth daily.  90 tablet  3  . pantoprazole (PROTONIX) 40 MG tablet Take 40 mg by mouth daily. Not having the need for the Protonix now        Patient confirms/reports the following allergies:  Allergies  Allergen Reactions  . Clopidogrel Bisulfate     Hives  . Penicillins     Pt unsure what type allergy/ was a child when had reaction      Patient is appropriate to schedule for requested procedure(s): yes  AUTHORIZATION INFORMATION Primary Insurance:   ID #:   Group #:  Pre-Cert / Auth required:  Pre-Cert / Auth #:   Secondary Insurance:   ID #:  Group #:  Pre-Cert / Auth required:  Pre-Cert / Auth #:   No orders of the defined types were placed in this encounter.    SCHEDULE INFORMATION: Procedure has been scheduled as follows:  Date: 10/03/2011     Time: 9:30 AM  Location: [facility] Short Stay  This Gastroenterology Pre-Precedure Form is being routed to the following provider(s) for review: Jonette Eva, MD

## 2011-09-20 LAB — DIFFERENTIAL
Basophils Relative: 1
Eosinophils Absolute: 0.3
Eosinophils Relative: 2
Monocytes Absolute: 0.9
Monocytes Relative: 7

## 2011-09-20 LAB — COMPREHENSIVE METABOLIC PANEL
ALT: 15
AST: 22
Albumin: 3.5
CO2: 26
Calcium: 9.3
Chloride: 105
Creatinine, Ser: 0.8
GFR calc Af Amer: >60
Sodium: 138
Total Bilirubin: 0.5

## 2011-09-20 LAB — CBC
Hemoglobin: 14
MCHC: 32.7
MCV: 92.7
MCV: 92.5
Platelets: 265
RBC: 4.6
RBC: 4.51
RDW: 13.6
WBC: 7

## 2011-09-20 LAB — BASIC METABOLIC PANEL
CO2: 26
Chloride: 101
GFR calc Af Amer: >60
Glucose, Bld: 148 — ABNORMAL HIGH
Sodium: 133 — ABNORMAL LOW

## 2011-09-20 LAB — POCT CARDIAC MARKERS
CKMB, poc: 1.5
Myoglobin, poc: 47.1
Operator id: 263501

## 2011-09-20 LAB — PROTIME-INR: INR: 1

## 2011-09-20 LAB — APTT: aPTT: 36

## 2011-09-27 LAB — POCT CARDIAC MARKERS
CKMB, poc: 3.5
Operator id: 166561
Operator id: 166561
Troponin i, poc: <0.05

## 2011-09-27 LAB — POCT I-STAT, CHEM 8
BUN: 17
Chloride: 102
HCT: 40
Potassium: 3.8

## 2011-10-02 ENCOUNTER — Encounter: Payer: Self-pay | Admitting: Gastroenterology

## 2011-10-02 MED ORDER — SODIUM CHLORIDE 0.45 % IV SOLN
Freq: Once | INTRAVENOUS | Status: AC
  Administered 2011-10-03: 09:00:00 via INTRAVENOUS

## 2011-10-03 ENCOUNTER — Encounter (HOSPITAL_COMMUNITY)
Admission: RE | Disposition: A | Discharge status: Home / Self Care | Payer: Self-pay | Source: Ambulatory Visit | Attending: Gastroenterology

## 2011-10-03 ENCOUNTER — Other Ambulatory Visit: Payer: Self-pay | Admitting: Gastroenterology

## 2011-10-03 ENCOUNTER — Encounter (HOSPITAL_COMMUNITY): Payer: Self-pay | Admitting: *Deleted

## 2011-10-03 ENCOUNTER — Ambulatory Visit (HOSPITAL_COMMUNITY)
Admission: RE | Admit: 2011-10-03 | Discharge: 2011-10-03 | Disposition: A | Discharge status: Home / Self Care | Payer: Federal, State, Local not specified - PPO | Source: Ambulatory Visit | Attending: Gastroenterology | Admitting: Gastroenterology

## 2011-10-03 DIAGNOSIS — K449 Diaphragmatic hernia without obstruction or gangrene: Secondary | ICD-10-CM | POA: Insufficient documentation

## 2011-10-03 DIAGNOSIS — K2980 Duodenitis without bleeding: Secondary | ICD-10-CM

## 2011-10-03 DIAGNOSIS — K2940 Chronic atrophic gastritis without bleeding: Secondary | ICD-10-CM | POA: Insufficient documentation

## 2011-10-03 DIAGNOSIS — K222 Esophageal obstruction: Secondary | ICD-10-CM | POA: Insufficient documentation

## 2011-10-03 DIAGNOSIS — Z79899 Other long term (current) drug therapy: Secondary | ICD-10-CM | POA: Insufficient documentation

## 2011-10-03 DIAGNOSIS — K259 Gastric ulcer, unspecified as acute or chronic, without hemorrhage or perforation: Secondary | ICD-10-CM

## 2011-10-03 DIAGNOSIS — A048 Other specified bacterial intestinal infections: Secondary | ICD-10-CM | POA: Insufficient documentation

## 2011-10-03 DIAGNOSIS — R1310 Dysphagia, unspecified: Secondary | ICD-10-CM | POA: Insufficient documentation

## 2011-10-03 DIAGNOSIS — I10 Essential (primary) hypertension: Secondary | ICD-10-CM | POA: Insufficient documentation

## 2011-10-03 HISTORY — DX: Gastro-esophageal reflux disease without esophagitis: K21.9

## 2011-10-03 HISTORY — PX: ESOPHAGOGASTRODUODENOSCOPY: SHX1529

## 2011-10-03 SURGERY — ESOPHAGOGASTRODUODENOSCOPY (EGD) WITH ESOPHAGEAL DILATION
Anesthesia: Moderate Sedation

## 2011-10-03 MED ORDER — MEPERIDINE HCL 100 MG/ML IJ SOLN
INTRAMUSCULAR | Status: DC | PRN
  Administered 2011-10-03: 50 mg
  Administered 2011-10-03: 25 mg

## 2011-10-03 MED ORDER — BUTAMBEN-TETRACAINE-BENZOCAINE 2-2-14 % EX AERO
INHALATION_SPRAY | CUTANEOUS | Status: DC | PRN
  Administered 2011-10-03: 1 via TOPICAL

## 2011-10-03 MED ORDER — MINERAL OIL PO OIL
TOPICAL_OIL | ORAL | Status: AC
  Filled 2011-10-03: qty 30

## 2011-10-03 MED ORDER — OMEPRAZOLE 20 MG PO CPDR: DELAYED_RELEASE_CAPSULE | ORAL | Status: DC

## 2011-10-03 MED ORDER — MIDAZOLAM HCL 5 MG/5ML IJ SOLN
INTRAMUSCULAR | Status: DC | PRN
  Administered 2011-10-03 (×2): 1 mg via INTRAVENOUS
  Administered 2011-10-03 (×2): 2 mg via INTRAVENOUS

## 2011-10-03 MED ORDER — MIDAZOLAM HCL 5 MG/5ML IJ SOLN
INTRAMUSCULAR | Status: AC
  Filled 2011-10-03: qty 10

## 2011-10-03 MED ORDER — MEPERIDINE HCL 100 MG/ML IJ SOLN
INTRAMUSCULAR | Status: AC
  Filled 2011-10-03: qty 1

## 2011-10-03 NOTE — H&P (Signed)
BP Pulse Temp(Src) Ht Wt BMI    134/91  71  97.4 F (36.3 C) (Temporal)  6' (1.829 m)  182 lb 12.8 oz (82.918 kg)  24.79 kg/m2          Progress Notes     Jonette Eva, MD  07/25/2011  9:46 AM  Signed    Subjective:      Patient ID: [name] [name], male    DOB: May 09, 1951, 60 y.o.   MRN: [id number]   PCP: Phillips Odor   HPI Pt seen and evaluated for CP in the ED [facility] 2012. [id number] and given Rx for Protonix. Took it regularly for first couple of weeks and then prn. It helped. If eats certain foods feels like it's getting stuck, especially w/ chicken breast. If he watches what he eats, no geartburn. If eats beans, seems to make CP flare. No nausea, or vomiting, abd pain, diarrhea, or constipation. No weight loss or change in his bowel habits. Bms: q1-2days. WHEN HE WAS YOUNGER HE'S 4-5 DAYS W/O HAVING A BM. TCS NUR LAST YEAR AT [facility], no problems with the sedation. Tasting blood from gum disease. No EGD ever.    Past Medical History   Diagnosis  Date   .  CAD (coronary artery disease)     .  HTN (hypertension)     .  Hyperlipidemia     .  Avascular necrosis of bone of hip         right   .  Anxiety     .  Paresthesias         Past Surgical History   Procedure  Date   .  Tonsillectomy  age 1   .  Total hip arthroplasty  age 10       right hip for avascular necrosis of hip and spine   .  Colonoscopy  2011 NUR [facility]       Allergies   Allergen  Reactions   .  Clopidogrel Bisulfate     .  Penicillins         Current Outpatient Prescriptions   Medication  Sig  Dispense  Refill   .  ALPRAZolam (XANAX) 0.5 MG tablet  Take 0.5 mg by mouth at bedtime as needed.           Marland Kitchen  aspirin 325 MG tablet  Take 325 mg by mouth daily.           Marland Kitchen  atorvastatin (LIPITOR) 40 MG tablet  Take 40 mg by mouth daily.           .  calcium carbonate (TUMS - DOSED IN MG ELEMENTAL CALCIUM) 500 MG chewable tablet  Chew 1 tablet by mouth as needed.           Marland Kitchen  losartan-hydrochlorothiazide  (HYZAAR) 100-25 MG per tablet  Take 1 tablet by mouth daily.   90 tablet   3   .  nitroGLYCERIN (NITROSTAT) 0.4 MG SL tablet  Place 0.4 mg under the tongue every 5 (five) minutes as needed.           .  pantoprazole (PROTONIX) 40 MG tablet  Take 40 mg by mouth daily.           Marland Kitchen  losartan-hydrochlorothiazide (HYZAAR) 100-25 MG per tablet  Take 1 tablet by mouth daily.   90 tablet   3       Family History   Problem  Relation  Age of Onset   .  Dementia  Mother     .  Prostate cancer  Father  33   .  Lung cancer  Sister  52   .  Colon cancer  Neg Hx     .  Colon polyps  Neg Hx         History       Social History   .  Marital Status:  Single       Social History Main Topics   .  Smoking status:  Former Smoker -- 1.0 packs/day       Types:  Cigarettes   .  Smokeless tobacco:  Not on file   .  Alcohol Use:  No   .  Drug Use:  No        Review of Systems  All other systems reviewed and are negative.     Objective:    Physical Exam  Vitals reviewed. Constitutional: He is oriented to person, place, and time. He appears well-developed and well-nourished. No distress.  HENT:   Head: Normocephalic and atraumatic.   Mouth/Throat: Oropharynx is clear and moist. No oropharyngeal exudate.  Eyes: Pupils are equal, round, and reactive to light.  Neck: Normal range of motion. Neck supple.  Cardiovascular: Normal rate, regular rhythm and normal heart sounds.   Pulmonary/Chest: Effort normal and breath sounds normal.  Abdominal: Soft. Bowel sounds are normal. He exhibits no distension and no mass. There is no tenderness.  Musculoskeletal: He exhibits no edema.  Lymphadenopathy:    He has no cervical adenopathy.  Neurological: He is alert and oriented to person, place, and time.  Psychiatric: He has a normal mood and affect.     Assessment & Plan:     Glendora Score  07/25/2011  9:39 AM  Signed Cc to PCP  Ferne Reus  07/25/2011  1:14 PM  Signed Reminder in epic to  follow up in 4 months        Dysphagia - Jonette Eva, MD  07/25/2011  9:27 AM  Signed Solid dysphagia most likely 2o to peptic stricture, less likely eso or stomach CA.   EGD/DIL, ? bX in the future. Risks and benefits explained. OPV in 4 mos. Pt will likely need qd PPI.

## 2011-10-03 NOTE — Interval H&P Note (Signed)
History and Physical Interval Note:   10/03/2011   9:31 AM   [name] [name]  has presented today for surgery, with the diagnosis of Dysphagia  The various methods of treatment have been discussed with the patient and family. After consideration of risks, benefits and other options for treatment, the patient has consented to  Procedure(s): ESOPHAGOGASTRODUODENOSCOPY (EGD) WITH ESOPHAGEAL DILATION as a surgical intervention .  I have reviewed the patients' chart and labs.  Questions were answered to the patient's satisfaction.     Jonette Eva  MD       Primary Care Physician:  Colette Ribas, MD Primary Gastroenterologist:  Dr. Darrick Penna  Pre-Procedure History & Physical: HPI:  [name] [name] is a 60 y.o. male here for   Past Medical History  Diagnosis Date  . CAD (coronary artery disease)   . HTN (hypertension)   . Hyperlipidemia   . Avascular necrosis of bone of hip     right  . Anxiety   . Paresthesias   . GERD (gastroesophageal reflux disease)     Past Surgical History  Procedure Date  . Tonsillectomy age 61  . Total hip arthroplasty age 72    right hip for avascular necrosis of hip and spine  . Colonoscopy 2011 NUR [facility] SCREENING d50 v5    MILD [place] TICS, ? PREP ARTIFACT V. PROCTITIS-rX:[place]  . Cardiac catheterization     3 stents placed    Prior to Admission medications   Medication Sig Start Date End Date Taking? Authorizing Provider  ALPRAZolam Prudy Feeler) 0.5 MG tablet Take 0.5 mg by mouth at bedtime as needed. For sleep   Yes Historical Provider, MD  aspirin 325 MG tablet Take 325 mg by mouth daily.     Yes Historical Provider, MD  Astaxanthin 4 MG CAPS Take 4 mg by mouth daily.     Yes Historical Provider, MD  atorvastatin (LIPITOR) 40 MG tablet Take 40 mg by mouth daily.     Yes Historical Provider, MD  calcium carbonate (TUMS - DOSED IN MG ELEMENTAL CALCIUM) 500 MG chewable tablet Chew 1 tablet by mouth as needed. For indigestion   Yes Historical  Provider, MD  cholecalciferol (VITAMIN D) 400 UNITS TABS Take 400 Units by mouth daily.     Yes Historical Provider, MD  cyanocobalamin 500 MCG tablet Take 500 mcg by mouth daily.     Yes Historical Provider, MD  losartan-hydrochlorothiazide (HYZAAR) 100-25 MG per tablet Take 1 tablet by mouth daily. 05/02/11 05/01/12 Yes Pricilla Riffle, MD  pantoprazole (PROTONIX) 40 MG tablet Take 40 mg by mouth daily. Not having the need for the Protonix now. Only takes when he feels he needs it   Yes Historical Provider, MD  losartan-hydrochlorothiazide (HYZAAR) 100-25 MG per tablet Take 1 tablet by mouth daily. 05/02/11 05/01/12  Pricilla Riffle, MD  nitroGLYCERIN (NITROSTAT) 0.4 MG SL tablet Place 0.4 mg under the tongue every 5 (five) minutes as needed. For chest pains    Historical Provider, MD    Allergies as of 09/11/2011 - Review Complete 09/11/2011  Allergen Reaction Noted  . Clopidogrel bisulfate  02/20/2010  . Penicillins  02/20/2010    Family History  Problem Relation Age of Onset  . Dementia Mother   . Prostate cancer Father 33  . Lung cancer Sister 12  . Colon cancer Neg Hx   . Colon polyps Neg Hx   . Anesthesia problems Neg Hx   . Hypotension Neg Hx   . Malignant  hyperthermia Neg Hx   . Pseudochol deficiency Neg Hx     History   Social History  . Marital Status: Single    Spouse Name: N/A    Number of Children: N/A  . Years of Education: N/A   Occupational History  . Not on file.   Social History Main Topics  . Smoking status: Former Smoker -- 1.0 packs/day for 30 years    Types: Cigarettes  . Smokeless tobacco: Never Used  . Alcohol Use: No  . Drug Use: No  . Sexually Active: Not on file   Other Topics Concern  . Not on file   Social History Narrative  . No narrative on file    Review of Systems: See HPI, otherwise negative ROS   Physical Exam: BP 125/93  Pulse 70  Temp(Src) 97.7 F (36.5 C) (Oral)  Resp 18  Ht 6' (1.829 m)  Wt 182 lb (82.555 kg)  BMI 24.68  kg/m2  SpO2 96% General:   Alert,  pleasant and cooperative in NAD Head:  Normocephalic and atraumatic. Neck:  Supple; no masses or thyromegaly. Lungs:  Clear throughout to auscultation.    Heart:  Regular rate and rhythm. Abdomen:  Soft, nontender and nondistended. Normal bowel sounds, without guarding, and without rebound.   Neurologic:  Alert and  oriented x4;  grossly normal neurologically.  Impression/Plan:   DYSPHAGIA  PLAN: EGD/DIL TODAY

## 2011-10-03 NOTE — Interval H&P Note (Signed)
History and Physical Interval Note:   10/03/2011   8:49 AM   Barbaraann Rondo  has presented today for surgery, with the diagnosis of Dysphagia  The various methods of treatment have been discussed with the patient and family. After consideration of risks, benefits and other options for treatment, the patient has consented to  Procedure(s): ESOPHAGOGASTRODUODENOSCOPY (EGD) WITH ESOPHAGEAL DILATION as a surgical intervention .  I have reviewed the patients' chart and labs.  Questions were answered to the patient's satisfaction.     Jonette Eva  MD

## 2011-10-09 ENCOUNTER — Telehealth: Payer: Self-pay | Admitting: Gastroenterology

## 2011-10-09 NOTE — Telephone Encounter (Signed)
Pt is aware of OV 12/12/11 at 0945 with [facility]

## 2011-10-09 NOTE — Telephone Encounter (Signed)
HE has H. Pylori gastritis. HE has an allergy to PCN and so HE needs PYLERA 3 PILLS QID FOR 10 DAYS, #QS, RFX0. She should take OMEPRAZOLE BID. The meds cAn cause nausea, vomiting, abd cramps, loose stools, black colored stools, and metallic taste in HIS mouth. opv IN DEC 2012. HE CAN CALL IF HE HAS ANY PROBLEMS WITH MEDS.

## 2011-10-10 ENCOUNTER — Telehealth: Payer: Self-pay | Admitting: Gastroenterology

## 2011-10-10 NOTE — Telephone Encounter (Signed)
[name] [name] called in regards to his Pylera Rx- he was wondering if it was possible to change the quantity to at least a 22 day supply- if we do this he will only have to pay $70 instead of $148 for a 10 day supply- he understands this is for more than he needs- If this is possible he would like the Rx faxed to [facility] [phone]. Please call and let him know either way

## 2011-10-10 NOTE — Telephone Encounter (Signed)
Routing to Dr. Fields.  

## 2011-10-10 NOTE — Telephone Encounter (Signed)
Pt was informed of results. Explained the side effects of the Pylera. Called Rx to [place] at [facility]. Pt and Hunter aware he needs Omprazole bid.

## 2011-10-11 ENCOUNTER — Telehealth: Payer: Self-pay | Admitting: Gastroenterology

## 2011-10-11 NOTE — Telephone Encounter (Signed)
Patient called back regarding his Rx for Pylera he is just check the satus??

## 2011-10-11 NOTE — Telephone Encounter (Signed)
LMOM for pt that Dr. Darrick Penna told me she would not be able to order more Pylera than he needed to treat H. Pylera. It would be insurance fraud. Can call if he has further questions.

## 2011-10-12 LAB — POCT CARDIAC MARKERS
CKMB, poc: 6.7
Myoglobin, poc: 123
Operator id: 270651
Operator id: 282201
Troponin i, poc: 0.09 — ABNORMAL HIGH
Troponin i, poc: 0.48 — ABNORMAL HIGH

## 2011-10-12 LAB — LIPID PANEL
Cholesterol: 232 — ABNORMAL HIGH
LDL Cholesterol: 187 — ABNORMAL HIGH
Triglycerides: 84
VLDL: 17

## 2011-10-12 LAB — APTT: aPTT: 37

## 2011-10-12 LAB — DIFFERENTIAL
Basophils Absolute: 0
Basophils Absolute: 0
Basophils Absolute: 0
Basophils Relative: 0
Eosinophils Absolute: 0
Eosinophils Relative: 0
Eosinophils Relative: 3
Lymphocytes Relative: 14
Lymphs Abs: 1.9
Monocytes Absolute: 0.5
Monocytes Absolute: 1.1 — ABNORMAL HIGH
Neutro Abs: 13 — ABNORMAL HIGH
Neutrophils Relative %: 79 — ABNORMAL HIGH

## 2011-10-12 LAB — COMPREHENSIVE METABOLIC PANEL
ALT: 8
ALT: <8
AST: 23
Albumin: 3.4 — ABNORMAL LOW
BUN: 17
BUN: 7
CO2: 25
CO2: 29
Calcium: 8.7
Calcium: 8.8
Calcium: 8.8
Chloride: 108
Creatinine, Ser: 0.83
Creatinine, Ser: 0.96
GFR calc Af Amer: >60
GFR calc non Af Amer: >60
GFR calc non Af Amer: >60
Glucose, Bld: 145 — ABNORMAL HIGH
Glucose, Bld: 99
Sodium: 138
Sodium: 139
Total Bilirubin: 0.9
Total Protein: 5.6 — ABNORMAL LOW
Total Protein: 5.7 — ABNORMAL LOW

## 2011-10-12 LAB — CARDIAC PANEL(CRET KIN+CKTOT+MB+TROPI)
CK, MB: 7.1 — ABNORMAL HIGH
CK, MB: 8.9 — ABNORMAL HIGH
Relative Index: 8.7 — ABNORMAL HIGH
Total CK: 102
Total CK: 83

## 2011-10-12 LAB — CBC
HCT: 33.1 — ABNORMAL LOW
HCT: 34.6 — ABNORMAL LOW
HCT: 38.2 — ABNORMAL LOW
HCT: 40.3
Hemoglobin: 11.3 — ABNORMAL LOW
Hemoglobin: 11.7 — ABNORMAL LOW
Hemoglobin: 12.9 — ABNORMAL LOW
MCHC: 33.7
MCHC: 34
MCHC: 34.1
MCV: 92.5
MCV: 93
MCV: 93.4
Platelets: 221
Platelets: 241
Platelets: 260
Platelets: 280
RBC: 4.36
RDW: 13.7
RDW: 13.9
RDW: 14
RDW: 14
RDW: 14.2 — ABNORMAL HIGH
WBC: 11 — ABNORMAL HIGH
WBC: 11.1 — ABNORMAL HIGH
WBC: 18.3 — ABNORMAL HIGH

## 2011-10-12 LAB — TROPONIN I
Troponin I: 0.97
Troponin I: 1.05
Troponin I: 1.16

## 2011-10-12 LAB — URINALYSIS, ROUTINE W REFLEX MICROSCOPIC
Glucose, UA: NEGATIVE
Protein, ur: NEGATIVE
Specific Gravity, Urine: 1.006
pH: 7

## 2011-10-12 LAB — CK TOTAL AND CKMB (NOT AT ARMC)
CK, MB: 5 — ABNORMAL HIGH
CK, MB: 7.9 — ABNORMAL HIGH
CK, MB: 8.3 — ABNORMAL HIGH
CK, MB: 8.7 — ABNORMAL HIGH
Relative Index: INVALID
Relative Index: INVALID
Relative Index: INVALID
Total CK: 77
Total CK: 85

## 2011-10-12 LAB — I-STAT 8, (EC8 V) (CONVERTED LAB)
BUN: 10
Chloride: 106
Glucose, Bld: 121 — ABNORMAL HIGH
HCT: 49
pCO2, Ven: 35.4 — ABNORMAL LOW
pH, Ven: 7.429 — ABNORMAL HIGH

## 2011-10-12 LAB — CK ISOENZYMES
CK-MB: 5 % — ABNORMAL HIGH (ref <5)
CK-MM: 95 % (ref 95–100)
Creatine Kinase-Total: 68 U/L (ref <=200)

## 2011-10-12 LAB — PROTIME-INR: Prothrombin Time: 14.3

## 2011-10-12 LAB — HEPARIN LEVEL (UNFRACTIONATED): Heparin Unfractionated: 0.56

## 2011-10-12 LAB — B-NATRIURETIC PEPTIDE (CONVERTED LAB): Pro B Natriuretic peptide (BNP): 33

## 2011-10-12 LAB — POCT I-STAT CREATININE
Creatinine, Ser: 0.9
Operator id: 282201

## 2011-10-12 LAB — SEDIMENTATION RATE: Sed Rate: 13

## 2011-10-15 ENCOUNTER — Telehealth: Payer: Self-pay

## 2011-10-15 NOTE — Telephone Encounter (Signed)
  Would  like to know if we could call in Cipro for him because he could not pay for the Pylera. If that would work.  Please advised.

## 2011-10-16 NOTE — Telephone Encounter (Addendum)
Please call pt. He may take Bismuth subsalicylate (Pepto-Bismol), TWO TBSP four times daily,  FLAGYL 250 mg four times daily, tetracycline, 500 mg four times daily, AND OMP BID FOR 14 DAYS, #QS, RFX0. Med side effects include nausea, vomiting, black stool, abd pain, and diarrhea. Avoid ETOH and cough syrup while taking Abx. OPV IN 3 MOS.

## 2011-10-16 NOTE — Telephone Encounter (Signed)
LMOm to call.  

## 2011-10-17 ENCOUNTER — Telehealth: Payer: Self-pay

## 2011-10-17 NOTE — Telephone Encounter (Signed)
T/C from April at [facility] , Tetracycline has been on back order for 6 months. Not sure when they can get it. She recommends Biaxin with the Flagyl. Please advise!

## 2011-10-17 NOTE — Telephone Encounter (Signed)
Informed pt. Of the meds and side effects. Told him I will call to [facility] when they open this AM.

## 2011-10-17 NOTE — Telephone Encounter (Signed)
Pt is aware of OV on 12/12/11 @ 0945 with SF

## 2011-10-17 NOTE — Telephone Encounter (Signed)
Called RX's to [place] at [facility].

## 2011-10-18 ENCOUNTER — Telehealth: Payer: Self-pay

## 2011-10-18 NOTE — Telephone Encounter (Signed)
OK to give Biaxin 500mg  po BID for 14 days in place of tetracycline.  If flagyl has not be filled yet please change to 500mg  po bid for 14 days. (If it has already been filled as 250mg  po qid, please have him take two pills at one time twice a day for 14 days)  He should still take his omeprazole bid.  He should still take peptobismol, two tablespoons four times a day for 14 days.

## 2011-10-18 NOTE — Telephone Encounter (Signed)
T/C from April at [facility]. Told her I will check with one of the extenders since Dr. Darrick Penna is absent.

## 2011-10-18 NOTE — Telephone Encounter (Signed)
April from [facility] called to see if pt can have Biaxin in place of Tetracycline since Tetracycline has been on back order for 6 months or longer. Please advise!

## 2011-10-18 NOTE — Telephone Encounter (Signed)
Called Rx to April @ [facility].

## 2011-10-22 NOTE — Telephone Encounter (Signed)
Please see note of 10/18/2011.

## 2011-10-30 NOTE — Telephone Encounter (Signed)
REVIEWED. AGREE 10/17-18.

## 2011-12-12 ENCOUNTER — Ambulatory Visit: Payer: Federal, State, Local not specified - PPO | Admitting: Gastroenterology

## 2011-12-20 ENCOUNTER — Encounter: Payer: Self-pay | Admitting: Internal Medicine

## 2011-12-20 ENCOUNTER — Ambulatory Visit (INDEPENDENT_AMBULATORY_CARE_PROVIDER_SITE_OTHER): Payer: Federal, State, Local not specified - PPO | Admitting: Internal Medicine

## 2011-12-20 VITALS — BP 128/84 | HR 68 | Ht 72.0 in | Wt 185.0 lb

## 2011-12-20 DIAGNOSIS — E785 Hyperlipidemia, unspecified: Secondary | ICD-10-CM

## 2011-12-20 DIAGNOSIS — K279 Peptic ulcer, site unspecified, unspecified as acute or chronic, without hemorrhage or perforation: Secondary | ICD-10-CM

## 2011-12-20 DIAGNOSIS — I10 Essential (primary) hypertension: Secondary | ICD-10-CM

## 2011-12-20 DIAGNOSIS — I2510 Atherosclerotic heart disease of native coronary artery without angina pectoris: Secondary | ICD-10-CM

## 2011-12-20 MED ORDER — LOSARTAN POTASSIUM-HCTZ 100-25 MG PO TABS: 1.0000 | ORAL_TABLET | Freq: Every day | ORAL | Status: DC

## 2011-12-20 MED ORDER — NITROGLYCERIN 0.4 MG SL SUBL: 0.4000 mg | SUBLINGUAL_TABLET | SUBLINGUAL | Status: DC | PRN

## 2011-12-20 MED ORDER — ASPIRIN EC 81 MG PO TBEC: 81.0000 mg | DELAYED_RELEASE_TABLET | Freq: Every day | ORAL | Status: AC

## 2011-12-20 MED ORDER — ATORVASTATIN CALCIUM 40 MG PO TABS: 40.0000 mg | ORAL_TABLET | Freq: Every day | ORAL | Status: DC

## 2011-12-20 NOTE — Progress Notes (Signed)
HPI Patient is a 60 year old with a history of CAD (s/p NSTEMI in 8/08; DESx2 to the D2; DES to the LAD). He also has a history of dyslpidemia and a Plavix allergy. He was last in December 2011.  SInce seen had an EGD that showed small ulcers.   H pylori positive but did not take antibiotics yet Breathing is OK.    Allergies  Allergen Reactions  . Bee Venom Hives and Swelling  . Clopidogrel Bisulfate     Hives  . Penicillins     Pt unsure what type allergy/ was a child when had reaction    Current Outpatient Prescriptions  Medication Sig Dispense Refill  . ALPRAZolam (XANAX) 0.5 MG tablet Take 0.5 mg by mouth at bedtime as needed. For sleep      . aspirin 325 MG tablet Take 325 mg by mouth daily.        . Astaxanthin 4 MG CAPS Take 4 mg by mouth daily.        Marland Kitchen atorvastatin (LIPITOR) 40 MG tablet Take 40 mg by mouth daily.        . calcium carbonate (TUMS - DOSED IN MG ELEMENTAL CALCIUM) 500 MG chewable tablet Chew 1 tablet by mouth as needed. For indigestion      . cholecalciferol (VITAMIN D) 400 UNITS TABS Take 1,000 Units by mouth daily.       . cyanocobalamin 500 MCG tablet Take 500 mcg by mouth daily.        Marland Kitchen losartan-hydrochlorothiazide (HYZAAR) 100-25 MG per tablet Take 1 tablet by mouth daily.  90 tablet  3  . nitroGLYCERIN (NITROSTAT) 0.4 MG SL tablet Place 0.4 mg under the tongue every 5 (five) minutes as needed. For chest pains      . omeprazole (PRILOSEC) 20 MG capsule 1 PO 30 MINUTES PRIOR TO MEALS BID  60 capsule  11  . clarithromycin (BIAXIN) 500 MG tablet Take 500 mg by mouth as directed.      . metroNIDAZOLE (FLAGYL) 500 MG tablet Take 500 mg by mouth as directed.        Past Medical History  Diagnosis Date  . CAD (coronary artery disease)   . HTN (hypertension)   . Hyperlipidemia   . Avascular necrosis of bone of hip     right  . Anxiety   . Paresthesias   . GERD (gastroesophageal reflux disease)     Past Surgical History  Procedure Date  .  Tonsillectomy age 59  . Total hip arthroplasty age 11    right hip for avascular necrosis of hip and spine  . Colonoscopy 2011 NUR [facility] SCREENING d50 v5    MILD [place] TICS, ? PREP ARTIFACT V. PROCTITIS-rX:[place]  . Cardiac catheterization     3 stents placed    Family History  Problem Relation Age of Onset  . Dementia Mother   . Prostate cancer Father 41  . Lung cancer Sister 10  . Colon cancer Neg Hx   . Colon polyps Neg Hx   . Anesthesia problems Neg Hx   . Hypotension Neg Hx   . Malignant hyperthermia Neg Hx   . Pseudochol deficiency Neg Hx     History   Social History  . Marital Status: Single    Spouse Name: N/A    Number of Children: N/A  . Years of Education: N/A   Occupational History  . Not on file.   Social History Main Topics  . Smoking status: Former  Smoker -- 1.0 packs/day for 30 years    Types: Cigarettes  . Smokeless tobacco: Never Used  . Alcohol Use: No  . Drug Use: No  . Sexually Active: Not on file   Other Topics Concern  . Not on file   Social History Narrative  . No narrative on file    Review of Systems:  All systems reviewed.  They are negative to the above problem except as previously stated.  Vital Signs: BP 128/84  Pulse 68  Ht 6' (1.829 m)  Wt 185 lb (83.915 kg)  BMI 25.09 kg/m2  Physical Exam  Patinet is in NAD  HEENT:  Normocephalic, atraumatic. EOMI, PERRLA.  Neck: JVP is normal. No thyromegaly. No bruits.  Lungs: clear to auscultation. No rales no wheezes.  Heart: Regular rate and rhythm. Normal S1, S2. No S3.  I/VI systolic murmur at apex.  PMI not displaced.  Abdomen:  Supple, nontender. Normal bowel sounds. No masses. No hepatomegaly.  Extremities:   Good distal pulses throughout. No lower extremity edema.  Musculoskeletal :moving all extremities.  Neuro:   alert and oriented x3.  CN II-XII grossly intact.  EKG:  SR 68    Assessment and Plan:

## 2011-12-20 NOTE — Patient Instructions (Signed)
Your physician wants you to follow-up in:  12 months.  You will receive a reminder letter in the mail two months in advance. If you don't receive a letter, please call our office to schedule the follow-up appointment.   

## 2011-12-21 DIAGNOSIS — K279 Peptic ulcer, site unspecified, unspecified as acute or chronic, without hemorrhage or perforation: Secondary | ICD-10-CM | POA: Insufficient documentation

## 2011-12-21 NOTE — Assessment & Plan Note (Addendum)
Doing well from a cardiac standpoint  Some of his CP in past may have been due to PUD.  Needs to Rx for this.   He remains active

## 2011-12-21 NOTE — Assessment & Plan Note (Signed)
Encouraged him to begin Rx for H pylori.

## 2011-12-21 NOTE — Assessment & Plan Note (Signed)
Will get labs drawn at Dr. Lamar Blinks office.

## 2012-01-08 ENCOUNTER — Encounter: Payer: Self-pay | Admitting: Gastroenterology

## 2012-01-09 ENCOUNTER — Ambulatory Visit (INDEPENDENT_AMBULATORY_CARE_PROVIDER_SITE_OTHER): Payer: Federal, State, Local not specified - PPO | Admitting: Gastroenterology

## 2012-01-09 ENCOUNTER — Encounter: Payer: Self-pay | Admitting: Gastroenterology

## 2012-01-09 DIAGNOSIS — K279 Peptic ulcer, site unspecified, unspecified as acute or chronic, without hemorrhage or perforation: Secondary | ICD-10-CM

## 2012-01-09 DIAGNOSIS — R1310 Dysphagia, unspecified: Secondary | ICD-10-CM

## 2012-01-09 NOTE — Assessment & Plan Note (Addendum)
RESOLVED.  USE PRILOSEC 30 MINUTES PRIOR TO YOUR FIRST MEAL. If your indigestion is not controlled, continue PRILOSEC 30 MINUTES PRIOR TO MEALS TWICE DAILY. FOLLOW UP IN 6 MOS.  Call for a repeat dilation if you have trouble swallowing meat, bread, or raw vegetables.

## 2012-01-09 NOTE — Assessment & Plan Note (Addendum)
2o to NSAIDs/H. PYLORI.  USE PRILOSEC 30 MINUTES PRIOR TO YOUR FIRST MEAL. Reviewed procedure films and discussed findings for EGD.

## 2012-01-09 NOTE — Progress Notes (Signed)
  Subjective:    Patient ID: [name] [name], male    DOB: 08/18/1951, 61 y.o.   MRN: [id number]  Pcp: [facility] 1o CARDS: ROSS  HPI Swallowing is better. Completed Pylera and had bad dreams and kept him awake at night. Stomach not hurting llike it used to.   Past Medical History  Diagnosis Date  . CAD (coronary artery disease)   . HTN (hypertension)   . Hyperlipidemia   . Avascular necrosis of bone of hip     right  . Anxiety   . Paresthesias   . GERD (gastroesophageal reflux disease)     Past Surgical History  Procedure Date  . Tonsillectomy age 29  . Total hip arthroplasty age 77    right hip for avascular necrosis of hip and spine  . Colonoscopy 2011 NUR [facility] SCREENING d50 v5    MILD [place] TICS, ? PREP ARTIFACT V. PROCTITIS-rX:[place]  . Cardiac catheterization     3 stents placed  . Esophagogastroduodenoscopy 10/03/11    small hiatal hernia/gastric ulcers/stricutre in distal esophagus    Allergies  Allergen Reactions  . Bee Venom Hives and Swelling  . Clopidogrel Bisulfate     Hives  . Penicillins     Pt unsure what type allergy/ was a child when had reaction    Current Outpatient Prescriptions  Medication Sig Dispense Refill  . ALPRAZolam (XANAX) 0.5 MG tablet Take 0.5 mg by mouth at bedtime as needed. For sleep    . aspirin EC 81 MG tablet Take 1 tablet (81 mg total) by mouth daily.    . Astaxanthin 4 MG CAPS Take 4 mg by mouth daily.      Marland Kitchen atorvastatin (LIPITOR) 40 MG tablet Take 1 tablet (40 mg total) by mouth daily.    . calcium carbonate (TUMS - DOSED IN MG ELEMENTAL CALCIUM) 500 MG chewable tablet Chew 1 tablet by mouth as needed. For indigestion    . cholecalciferol (VITAMIN D) 400 UNITS TABS Take 1,000 Units by mouth daily.     . cyanocobalamin 500 MCG tablet Take 500 mcg by mouth daily.      Marland Kitchen losartan-hydrochlorothiazide (HYZAAR) 100-25 MG per tablet Take 1 tablet by mouth daily.    . nitroGLYCERIN (NITROSTAT) 0.4 MG SL tablet Place 1 tablet (0.4 mg  total) under the tongue every 5 (five) minutes as needed. For chest pains    . omeprazole (PRILOSEC) 20 MG capsule 1 PO 30 MINUTES PRIOR TO MEALS BID    . clarithromycin (BIAXIN) 500 MG tablet Take 500 mg by mouth as directed.    . metroNIDAZOLE (FLAGYL) 500 MG tablet Take 500 mg by mouth as directed.          Review of Systems     Objective:   Physical Exam  Vitals reviewed. Constitutional: He is oriented to person, place, and time. He appears well-developed and well-nourished. No distress.  HENT:  Head: Normocephalic and atraumatic.  Mouth/Throat: Oropharynx is clear and moist. No oropharyngeal exudate.  Cardiovascular: Normal rate, regular rhythm and normal heart sounds.   Pulmonary/Chest: Effort normal and breath sounds normal. No respiratory distress.  Abdominal: Soft. Bowel sounds are normal. He exhibits no distension. There is no tenderness.  Neurological: He is alert and oriented to person, place, and time.       NO FOCAL DEFICITS   Psychiatric:       SLIGHTLY ANXIOUS MOOD          Assessment & Plan:

## 2012-01-09 NOTE — Patient Instructions (Signed)
USE PRILOSEC 30 MINUTES PRIOR TO YOUR FIRST MEAL. If your indigestion is not controlled, continue PRILOSEC 30 MINUTES PRIOR TO MEALS TWICE DAILY. FOLLOW UP IN 6 MOS.  Call for a repeat dilation if you have trouble swallowing meat, bread, or raw vegetables.

## 2012-01-09 NOTE — Progress Notes (Signed)
Cc to PCP 

## 2012-01-09 NOTE — Progress Notes (Signed)
Reminder in epic to follow up in 6 months °

## 2012-02-21 ENCOUNTER — Ambulatory Visit (HOSPITAL_COMMUNITY)
Admission: RE | Admit: 2012-02-21 | Discharge: 2012-02-21 | Disposition: A | Discharge status: Home / Self Care | Payer: Federal, State, Local not specified - PPO | Source: Ambulatory Visit | Attending: Sports Medicine | Admitting: Sports Medicine

## 2012-02-21 DIAGNOSIS — M545 Low back pain, unspecified: Secondary | ICD-10-CM | POA: Insufficient documentation

## 2012-02-21 DIAGNOSIS — M25559 Pain in unspecified hip: Secondary | ICD-10-CM | POA: Insufficient documentation

## 2012-02-21 DIAGNOSIS — IMO0001 Reserved for inherently not codable concepts without codable children: Secondary | ICD-10-CM | POA: Insufficient documentation

## 2012-02-21 DIAGNOSIS — M6281 Muscle weakness (generalized): Secondary | ICD-10-CM | POA: Insufficient documentation

## 2012-02-21 NOTE — Evaluation (Signed)
Physical Therapy Evaluation  Patient Details  Name: [name] [name] MRN: [id number] Date of Birth: 27-Sep-1951  Today's Date: 02/21/2012 Time: 1100-1212 Time Calculation (min): 72 min  Visit#: 1  of 8   Re-eval: 03/22/12 Assessment Diagnosis: spondylolisthesis Next MD Visit: 03/22/12 Prior Therapy: none  Past Medical History:  Past Medical History  Diagnosis Date  . CAD (coronary artery disease)   . HTN (hypertension)   . Hyperlipidemia   . Avascular necrosis of bone of hip     right  . Anxiety   . Paresthesias   . GERD (gastroesophageal reflux disease)    Past Surgical History:  Past Surgical History  Procedure Date  . Tonsillectomy age 57  . Total hip arthroplasty age 67    right hip for avascular necrosis of hip and spine  . Colonoscopy 2011 NUR [facility] SCREENING d50 v5    MILD [place] TICS, ? PREP ARTIFACT V. PROCTITIS-rX:[place]  . Cardiac catheterization     3 stents placed  . Esophagogastroduodenoscopy 10/03/11    small hiatal hernia/gastric ulcers/stricutre in distal esophagus    Subjective Symptoms/Limitations Symptoms: [name] [name] states that he has been having low back pain for the past years.  The patient states that his pain is progressive in nature but he was hict from behind about two months ago and this seemed to aggrevate his low back pain.  The patient states that his pain is mainly  right lowback and radiates into his right hip area.   How long can you sit comfortably?: The patient states that sitting does not bother him How long can you stand comfortably?: The patient states that standing is the worse.  He has pain immediately and it increases the longer he stands after three minutes he will neeed to find somewhere to sit down. How long can you walk comfortably?: The patient states that he is able to walk longer than he can stand.  He is able to walk for ten minutes and he has to rest for a short while and then he can continue. Special Tests: Pt states he is  waking up 5-6 times a night Pain Assessment Currently in Pain?: No/denies (the highest pain the patient has been in during  wk = 6/10)  Prior Functioning  Prior Function Vocation: Retired Leisure: Hobbies-yes (Comment) Comments: walking   Cognition/Observation Cognition Overall Cognitive Status: Appears within functional limits for tasks assessed  Assessment RLE Strength Right Hip Flexion: 5/5 Right Hip Extension: 4/5 Right Hip ABduction: 4/5 Right Hip ADduction: 4/5 Right Knee Flexion: 5/5 Right Knee Extension: 5/5 Right Ankle Dorsiflexion: 4/5 LLE Strength Left Hip Flexion: 5/5 Left Hip Extension: 5/5 Left Hip ABduction: 5/5 Left Hip ADduction: 5/5 Left Knee Flexion: 5/5 Left Knee Extension: 5/5 Left Ankle Dorsiflexion: 5/5 Lumbar AROM Lumbar Flexion: wfl Lumbar Extension: decreased 50% Lumbar - Right Side Bend: wfl Lumbar - Left Side Bend: wfl Lumbar - Right Rotation: wfl Lumbar - Left Rotation: wfl  Exercise/Treatments Mobility/Balance  Posture/Postural Control Posture/Postural Control: Postural limitations Postural Limitations: slight forward head, rounded shoulder and increased kyphosis   Stretches Active Hamstring Stretch: 3 reps;30 seconds Single Knee to Chest Stretch: 3 reps;30 seconds Standing Side Bend: Limitations Standing Side Bend Limitations: pelvic tilt x 10  Modalities Modalities: Ultrasound Ultrasound Ultrasound Location: R L4-S1 Ultrasound Parameters: 1.5 w/cm2  1Mg HZ x 8' Ultrasound Goals: Pain  Physical Therapy Assessment and Plan PT Assessment and Plan Clinical Impression Statement: Pt with back pain, stiffness and decreased tolerance to activity who will benefit from  skilled PT to return pr to previous funtional level. Rehab Potential: Good Clinical Impairments Affecting Rehab Potential: pain, stiffness, weakness PT Frequency: Min 2X/week PT Duration: 4 weeks PT Plan: beging stability ex next treatment;  next treatment ab sets,  bent knee lift; bridge and clam pt will need good instruction on stability as therapist has not introduced this yet.  DO NOT LET PT MOVE BACK WITH EXCERISES....place hand on pt low back.  Third treatment T_BAnd ; SLR; sidelying ab; fourth prone heel squeeze, SAR, SLR    Goals Home Exercise Program Pt will Perform Home Exercise Program: Independently PT Short Term Goals Time to Complete Short Term Goals: 2 weeks PT Short Term Goal 1: Pt pain to be no greater than a 5  PT Short Term Goal 2: Pt to be able to stand for 10 minutes without difficulty PT Short Term Goal 3: Pt to be able to walk for 30 minutes without difficulty PT Long Term Goals Time to Complete Long Term Goals: 4 weeks PT Long Term Goal 1: Pt pain level to be no greater than a 3 PT Long Term Goal 2: Pt to be able to stand for 20 minutes to allow pt to make a small meal. Long Term Goal 3: Pt to be able to walk for 40 minutes without having increased back pain. Long Term Goal 4: Pt to be only waking up 2 times a night.  Problem List Patient Active Problem List  Diagnoses  . HYPERLIPIDEMIA-MIXED  . ESSENTIAL HYPERTENSION, BENIGN  . CAD, NATIVE VESSEL  . CUTANEOUS ERUPTIONS, DRUG-INDUCED  . LOCALIZED SUPERFICIAL SWELLING MASS OR LUMP  . Dysphagia  . PUD (peptic ulcer disease)  . Stiffness of joint, not elsewhere classified, other specified site    PT - End of Session Activity Tolerance: Patient tolerated treatment well General Behavior During Session: [facility] for tasks performed PT Plan of Care PT Home Exercise Plan: given Consulted and Agree with Plan of Care: Patient    Howie Rufus,CINDY 02/21/2012, 12:14 PM  Physician Documentation Your signature is required to indicate approval of the treatment plan as stated above.  Please sign and either send electronically or make a copy of this report for your files and return this physician signed original.   Please mark one 1.__approve of plan  2. ___approve of plan with the  following conditions.   ______________________________                                                          _____________________ Physician Signature                                                                                                             Date

## 2012-02-26 ENCOUNTER — Ambulatory Visit (HOSPITAL_COMMUNITY)
Admission: RE | Admit: 2012-02-26 | Discharge: 2012-02-26 | Disposition: A | Discharge status: Home / Self Care | Payer: Federal, State, Local not specified - PPO | Source: Ambulatory Visit | Attending: Family Medicine | Admitting: Family Medicine

## 2012-02-26 NOTE — Progress Notes (Signed)
Physical Therapy Treatment Patient Details  Name: [name] [name] MRN: [id number] Date of Birth: 01-06-1951  Today's Date: 02/26/2012 Time: 1022-1118 Time Calculation (min): 56 min Visit#: 2  of 8   Re-eval: 03/22/12 Charges: Therex 40' Ultrasound x 8'  Subjective: Symptoms/Limitations Symptoms: My pain is bad in the morning but once I get it loosened up it gets better. When I stand for a while it get's flared up. Pain Assessment Currently in Pain?: Yes Pain Score:   4 Pain Location: Back Pain Orientation: Lower Pain Type: Chronic pain Pain Radiating Towards: the R hip   Exercise/Treatments Stretches Active Hamstring Stretch: 3 reps;30 seconds Single Knee to Chest Stretch: 3 reps;30 seconds Stability Clam: 10 reps;Supine Bridge: 10 reps Bent Knee Raise: 5 reps;Supine Ab Set: 10 reps;5 seconds;Supine  Modalities Modalities: Ultrasound Ultrasound Ultrasound Location: R L4-S1 Ultrasound Parameters: 1.2 w/cm2 1Mg HZ x 8' Ultrasound Goals: Pain  Physical Therapy Assessment and Plan PT Assessment and Plan Clinical Impression Statement: Pt compeltes stability exercises well after cueing and education. Pt is without complain throughout session. Pt reports pain decrease to 3/10 at end of session. PT Plan: Begin scapular tband, SLR and sidelying abd.     Problem List Patient Active Problem List  Diagnoses  . HYPERLIPIDEMIA-MIXED  . ESSENTIAL HYPERTENSION, BENIGN  . CAD, NATIVE VESSEL  . CUTANEOUS ERUPTIONS, DRUG-INDUCED  . LOCALIZED SUPERFICIAL SWELLING MASS OR LUMP  . Dysphagia  . PUD (peptic ulcer disease)  . Stiffness of joint, not elsewhere classified, other specified site    PT - End of Session Activity Tolerance: Patient tolerated treatment well General Behavior During Session: [facility] for tasks performed Cognition: [facility] for tasks performed    Antonieta Iba 02/26/2012, 11:30 AM

## 2012-02-28 ENCOUNTER — Ambulatory Visit (HOSPITAL_COMMUNITY)
Admission: RE | Admit: 2012-02-28 | Discharge: 2012-02-28 | Disposition: A | Discharge status: Home / Self Care | Payer: Federal, State, Local not specified - PPO | Source: Ambulatory Visit | Attending: Sports Medicine | Admitting: Sports Medicine

## 2012-02-28 NOTE — Progress Notes (Signed)
Physical Therapy Treatment Patient Details  Name: [name] [name] MRN: [id number] Date of Birth: 17-Jan-1951  Today's Date: 02/28/2012 Time: 1440-1540 Time Calculation (min): 60 min Visit#: 3  of 8   Re-eval: 03/22/12 Charges: Therex x 35' Ultrasound x 8'  Subjective: Symptoms/Limitations Symptoms: Pt states that his pain this morning was a 6/10 but it has come down to a 4/10. Pain Assessment Currently in Pain?: Yes Pain Score:   4 Pain Location: Back Pain Orientation: Lower   Exercise/Treatments Stretches Active Hamstring Stretch: 3 reps;30 seconds Single Knee to Chest Stretch: 3 reps;30 seconds Stability Clam: 10 reps;Side-lying Bridge: 15 reps Bent Knee Raise: 10 reps Ab Set: 10 reps;5 seconds;Supine Hip Abduction: 10 reps;Side-lying  Modalities Modalities: Ultrasound Ultrasound Ultrasound Location: R lower lumbar Ultrasound Parameters: 1.2 w/cm2 1Mg HZ x 8'  Ultrasound Goals: Pain  Physical Therapy Assessment and Plan PT Assessment and Plan Clinical Impression Statement: Pt completes all therex without difficulty. Pt reports HEP compliance. Ultrasound completed to R lower lumber to decrease pain tightness. Pt reports pain decrease to 3/10. PT Plan: Continue to progress per PT POC. Assess need for massage next session.     Problem List Patient Active Problem List  Diagnoses  . HYPERLIPIDEMIA-MIXED  . ESSENTIAL HYPERTENSION, BENIGN  . CAD, NATIVE VESSEL  . CUTANEOUS ERUPTIONS, DRUG-INDUCED  . LOCALIZED SUPERFICIAL SWELLING MASS OR LUMP  . Dysphagia  . PUD (peptic ulcer disease)  . Stiffness of joint, not elsewhere classified, other specified site    PT - End of Session Activity Tolerance: Patient tolerated treatment well General Behavior During Session: [facility] for tasks performed Cognition: [facility] for tasks performed   Seth Bake, PTA 02/28/2012, 3:53 PM

## 2012-03-04 ENCOUNTER — Ambulatory Visit (HOSPITAL_COMMUNITY)
Admission: RE | Admit: 2012-03-04 | Discharge: 2012-03-04 | Disposition: A | Discharge status: Home / Self Care | Payer: Federal, State, Local not specified - PPO | Source: Ambulatory Visit | Attending: Family Medicine | Admitting: Family Medicine

## 2012-03-04 DIAGNOSIS — M545 Low back pain, unspecified: Secondary | ICD-10-CM | POA: Insufficient documentation

## 2012-03-04 DIAGNOSIS — IMO0001 Reserved for inherently not codable concepts without codable children: Secondary | ICD-10-CM | POA: Insufficient documentation

## 2012-03-04 DIAGNOSIS — M6281 Muscle weakness (generalized): Secondary | ICD-10-CM | POA: Insufficient documentation

## 2012-03-04 DIAGNOSIS — M25559 Pain in unspecified hip: Secondary | ICD-10-CM | POA: Insufficient documentation

## 2012-03-04 NOTE — Progress Notes (Signed)
Physical Therapy Treatment Patient Details  Name: [name] [name] MRN: [id number] Date of Birth: 1951-12-19  Today's Date: 03/04/2012 Time: 7829-5621 Time Calculation (min): 61 min Visit#: 4  of 8   Re-eval: 03/22/12 Charges: Therex x 45' Ultrasound x 8'  Subjective: Symptoms/Limitations Symptoms: Pt states that he was very stiff this morning . Pain Assessment Currently in Pain?: Yes Pain Score:   4 Pain Location: Back Pain Orientation: Lower   Exercise/Treatments Stability Clam: 15 reps;Side-lying Bridge: 15 reps Bent Knee Raise: 10 reps Large Ball Abdominal Isometric: 10 reps;5 seconds;Supine Hip Abduction: 15 reps;Side-lying Leg Raise: 10 reps;Prone;Right;Left Functional Squats: 10 reps  Physical Therapy Assessment and Plan PT Assessment and Plan Clinical Impression Statement: Pt completes most exercises with good form and control. Pt displays good abdominal contraction with stabilization exercises. Added new exercises per doc flow sheets without difficulty. Pt did require multimodal cueing to avoid lumbar contraction with prone leg raise. Pt reports pain decrease to 2/10 at end of session. PT Plan: Continue to progress per PT POC.     Problem List Patient Active Problem List  Diagnoses  . HYPERLIPIDEMIA-MIXED  . ESSENTIAL HYPERTENSION, BENIGN  . CAD, NATIVE VESSEL  . CUTANEOUS ERUPTIONS, DRUG-INDUCED  . LOCALIZED SUPERFICIAL SWELLING MASS OR LUMP  . Dysphagia  . PUD (peptic ulcer disease)  . Stiffness of joint, not elsewhere classified, other specified site    PT - End of Session Activity Tolerance: Patient tolerated treatment well General Behavior During Session: [facility] for tasks performed Cognition: [facility] for tasks performed   Seth Bake, PTA 03/04/2012, 11:26 AM

## 2012-03-06 ENCOUNTER — Ambulatory Visit (HOSPITAL_COMMUNITY): Payer: Federal, State, Local not specified - PPO | Admitting: *Deleted

## 2012-03-11 ENCOUNTER — Ambulatory Visit (HOSPITAL_COMMUNITY)
Admission: RE | Admit: 2012-03-11 | Discharge: 2012-03-11 | Disposition: A | Discharge status: Home / Self Care | Payer: Federal, State, Local not specified - PPO | Source: Ambulatory Visit | Attending: Family Medicine | Admitting: Family Medicine

## 2012-03-11 NOTE — Progress Notes (Signed)
Physical Therapy Treatment Patient Details  Name: [name] [name] MRN: [id number] Date of Birth: [birth year]  Today's Date: 03/11/2012 Time: 1020-1103 Time Calculation (min): 43 min Visit#: 5  of 8   Re-eval: 03/21/12 Charges:  therex 32', ultrasound 8'    Subjective: Symptoms/Limitations Symptoms: Pt. states he's only having minimal pain today, stilll at a 4/10 Pain Assessment Currently in Pain?: Yes Pain Score:   4 Pain Location: Back Pain Orientation: Lower   Exercise/Treatments Stability Clam: Side-lying;15 reps Bridge: 15 reps Bent Knee Raise: 10 reps Large Ball Abdominal Isometric: 10 reps Hip Abduction: Side-lying;10 reps Single Arm Raise: Prone;5 reps Leg Raise: Prone;5 reps Functional Squats: 15 reps    Modalities Modalities: Ultrasound Ultrasound Ultrasound Location: R lower lumbar  Ultrasound Parameters: 1.4w/cm2, 1 MHz X 8'  Physical Therapy Assessment and Plan PT Assessment and Plan Clinical Impression Statement: Added prone alternating UE's to increase stability without difficulty.  Able to complete all other exercises in good form today. PT Plan: Continue POC.     Problem List Patient Active Problem List  Diagnoses  . HYPERLIPIDEMIA-MIXED  . ESSENTIAL HYPERTENSION, BENIGN  . CAD, NATIVE VESSEL  . CUTANEOUS ERUPTIONS, DRUG-INDUCED  . LOCALIZED SUPERFICIAL SWELLING MASS OR LUMP  . Dysphagia  . PUD (peptic ulcer disease)  . Stiffness of joint, not elsewhere classified, other specified site     Taleeya Blondin B. Bascom Levels, PTA 03/11/2012, 11:08 AM

## 2012-03-12 ENCOUNTER — Telehealth (HOSPITAL_COMMUNITY): Payer: Self-pay | Admitting: Physical Therapy

## 2012-03-13 ENCOUNTER — Ambulatory Visit (HOSPITAL_COMMUNITY): Payer: Federal, State, Local not specified - PPO | Admitting: Physical Therapy

## 2012-03-18 ENCOUNTER — Ambulatory Visit (HOSPITAL_COMMUNITY)
Admission: RE | Admit: 2012-03-18 | Discharge: 2012-03-18 | Disposition: A | Discharge status: Home / Self Care | Payer: Federal, State, Local not specified - PPO | Source: Ambulatory Visit | Attending: Family Medicine | Admitting: Family Medicine

## 2012-03-18 NOTE — Progress Notes (Signed)
Physical Therapy Treatment Patient Details  Name: [name] [name] MRN: [id number] Date of Birth: November 28, 1951  Today's Date: 03/18/2012 Time: 1020-1105 Time Calculation (min): 45 min Visit#: 6  of 8   Re-eval: 03/21/12 Charges: Therex x 32' Ultrasound x 8'  Subjective: Symptoms/Limitations Symptoms: My pain is getting better. Pain Assessment Currently in Pain?: Yes Pain Score:   3 Pain Location: Back Pain Orientation: Lower   Exercise/Treatments Stability Clam: 15 reps;Side-lying;5 seconds Bridge: 15 reps Bent Knee Raise: 10 reps Large Ball Abdominal Isometric: 10 reps Large Ball Oblique Isometric: 5 reps Hip Abduction: 15 reps;Side-lying Single Arm Raise:  (Time) Leg Raise:  (Time) Functional Squats: 15 reps  Modalities Modalities: Ultrasound Ultrasound Ultrasound Location: R lower lumbar  Ultrasound Parameters: 1.5w/cm2 1 MHz X 8' Ultrasound Goals: Pain  Physical Therapy Assessment and Plan PT Assessment and Plan Clinical Impression Statement: Pt compeltes all exercises with good form and minimal need for cueing. Unable to complete prone exercises this session secondary to time. Pt advised to complete prone exercises at home. Pt states taht [place] has helped his pain. Pt reports pain decrease to 2/10 at end of session.  PT Plan: Reassess next session.     Problem List Patient Active Problem List  Diagnoses  . HYPERLIPIDEMIA-MIXED  . ESSENTIAL HYPERTENSION, BENIGN  . CAD, NATIVE VESSEL  . CUTANEOUS ERUPTIONS, DRUG-INDUCED  . LOCALIZED SUPERFICIAL SWELLING MASS OR LUMP  . Dysphagia  . PUD (peptic ulcer disease)  . Stiffness of joint, not elsewhere classified, other specified site    PT - End of Session Activity Tolerance: Patient tolerated treatment well General Behavior During Session: [facility] for tasks performed Cognition: [facility] for tasks performed    Seth Bake, PTA 03/18/2012, 11:32 AM

## 2012-03-20 ENCOUNTER — Ambulatory Visit (HOSPITAL_COMMUNITY)
Admission: RE | Admit: 2012-03-20 | Discharge: 2012-03-20 | Disposition: A | Discharge status: Home / Self Care | Payer: Federal, State, Local not specified - PPO | Source: Ambulatory Visit | Attending: Family Medicine | Admitting: Family Medicine

## 2012-03-20 NOTE — Evaluation (Signed)
Physical Therapy reassessment2 Patient Details  Name: [name] [name] MRN: [id number] Date of Birth: [birth year]  Today's Date: 03/20/2012 Time: 1020-1102 Time Calculation (min): 42 min  Visit#: 7  of 15   Re-eval: 04/19/12 Assessment Diagnosis: spondylolisthesis Next MD Visit: 03/22/12 Prior Therapy: none  Past Medical History:  Past Medical History  Diagnosis Date  . CAD (coronary artery disease)   . HTN (hypertension)   . Hyperlipidemia   . Avascular necrosis of bone of hip     right  . Anxiety   . Paresthesias   . GERD (gastroesophageal reflux disease)    Past Surgical History:  Past Surgical History  Procedure Date  . Tonsillectomy age 40  . Total hip arthroplasty age 21    right hip for avascular necrosis of hip and spine  . Colonoscopy 2011 NUR [facility] SCREENING d50 v5    MILD [place] TICS, ? PREP ARTIFACT V. PROCTITIS-rX:[place]  . Cardiac catheterization     3 stents placed  . Esophagogastroduodenoscopy 10/03/11    small hiatal hernia/gastric ulcers/stricutre in distal esophagus    Subjective Symptoms/Limitations Symptoms: I have been doing my exercises at home.  I try and get them done once a dayl. How long can you sit comfortably?: Sitting is not painful. How long can you stand comfortably?: The patient states that he is able to stand longer but he is unsure how long he is able to stand.  Tested pt was having increase pain after three minutes. How long can you walk comfortably?: The patient states that he is able to walk for 30 minutes without any pain was ten minutes. Pain Assessment Currently in Pain?: Yes Pain Score:   3 Pain Location: Back Pain Orientation: Right;Lower Pain Type: Chronic pain Pain Relieving Factors: sitting  Prior Functioning  Prior Function Vocation: Retired Leisure: Hobbies-yes (Comment)  Cognition/Observation Cognition Overall Cognitive Status: Appears within functional limits for tasks assessed   Assessment RLE Strength Right  Hip Flexion: 5/5 Right Hip Extension: 4/5 (was 4/5) Right Hip ABduction: 5/5 (was 4/5) Right Hip ADduction: 4/5 (was 4/5) Right Knee Flexion: 5/5 Right Knee Extension: 5/5 Right Ankle Dorsiflexion: 5/5 (was 4/5) LLE Strength Left Hip Flexion: 5/5 Left Hip Extension: 5/5 Left Hip ABduction: 5/5 Left Hip ADduction: 5/5 Left Knee Flexion: 5/5 Left Knee Extension: 5/5 Left Ankle Dorsiflexion: 5/5 Lumbar AROM Lumbar Flexion: wfl Lumbar Extension: deceased 25 % was 50% Lumbar - Right Side Bend: wfl Lumbar - Left Side Bend: wfl Lumbar - Right Rotation: wfl Lumbar - Left Rotation: wfl  Exercise/Treatments Mobility/Balance  Posture/Postural Control Posture/Postural Control: Postural limitations Postural Limitations: slight forward head, rounded shoulder and increased kyphosis   Stretches Active Hamstring Stretch: 3 reps;30 seconds Single Knee to Chest Stretch: 3 reps;30 seconds Prone on Elbows Stretch: 2 reps;10 seconds Lumbar Exercises   Stability Bridge: 10 reps;Limitations Bridge Limitations: hold for 10 sec. Machine Exercises    Manual Therapy Manual Therapy: Massage Massage: questionable fatty nodule noted along L L4, R L4 ?? nodule or tight mm;   Physical Therapy Assessment and Plan PT Assessment and Plan Clinical Impression Statement: Pt improving but is not at maximal benefit recommend continued therapy.  D/C ultrasound begin massage and A/P mobs grade I II Rehab Potential: Good Clinical Impairments Affecting Rehab Potential: pain, stiffness, weakness PT Frequency: Min 2X/week PT Duration: 4 weeks PT Treatment/Interventions: Therapeutic activities;Other (comment) (modalities as indicated for pain.) PT Plan: begin massage and mobs continue with strengthening of hip adductors and extensors.     Goals  Home Exercise Program PT Goal: Perform Home Exercise Program - Progress: Met PT Short Term Goals PT Short Term Goal 1: HIghest Pt pain has been has been a 6/10  goal 5/10 PT Short Term Goal 1 - Progress: Progressing toward goal PT Short Term Goal 2: able to stand 3' goal 10' PT Short Term Goal 3: Pt is able to walk for 30 minutes without difficulty now. Goal was 30 min. PT Short Term Goal 3 - Progress: Met PT Long Term Goals PT Long Term Goal 1: Goal no higher than 3/10 not met PT Long Term Goal 1 - Progress: Not met PT Long Term Goal 2: goal to allow pt to stand for 20 minute not met. Long Term Goal 3: Pt to be able to walk for 40 minutes without pain-progressing towards goal Long Term Goal 3 Progress: Progressing toward goal Long Term Goal 4: Pt is still waking up four times a night  goal is 2 was 6.  Problem List Patient Active Problem List  Diagnoses  . HYPERLIPIDEMIA-MIXED  . ESSENTIAL HYPERTENSION, BENIGN  . CAD, NATIVE VESSEL  . CUTANEOUS ERUPTIONS, DRUG-INDUCED  . LOCALIZED SUPERFICIAL SWELLING MASS OR LUMP  . Dysphagia  . PUD (peptic ulcer disease)  . Stiffness of joint, not elsewhere classified, other specified site    PT - End of Session Activity Tolerance: Patient tolerated treatment well General Behavior During Session: [facility] for tasks performed Cognition: [facility] for tasks performed  GP             [name],CINDY 03/20/2012, 12:25 PM  Physician Documentation Your signature is required to indicate approval of the treatment plan as stated above.  Please sign and either send electronically or make a copy of this report for your files and return this physician signed original.   Please mark one 1.__approve of plan  2. ___approve of plan with the following conditions.   ______________________________                                                          _____________________ Physician Signature                                                                                                             Date

## 2012-06-06 ENCOUNTER — Encounter: Payer: Self-pay | Admitting: Gastroenterology

## 2012-10-08 ENCOUNTER — Other Ambulatory Visit: Payer: Self-pay | Admitting: Gastroenterology

## 2012-10-14 ENCOUNTER — Ambulatory Visit (INDEPENDENT_AMBULATORY_CARE_PROVIDER_SITE_OTHER): Payer: Federal, State, Local not specified - PPO | Admitting: Gastroenterology

## 2012-10-14 ENCOUNTER — Encounter: Payer: Self-pay | Admitting: Gastroenterology

## 2012-10-14 VITALS — BP 143/98 | HR 75 | Temp 98.6°F | Ht 72.0 in | Wt 182.6 lb

## 2012-10-14 DIAGNOSIS — K219 Gastro-esophageal reflux disease without esophagitis: Secondary | ICD-10-CM | POA: Insufficient documentation

## 2012-10-14 DIAGNOSIS — K279 Peptic ulcer, site unspecified, unspecified as acute or chronic, without hemorrhage or perforation: Secondary | ICD-10-CM

## 2012-10-14 DIAGNOSIS — K5900 Constipation, unspecified: Secondary | ICD-10-CM | POA: Insufficient documentation

## 2012-10-14 MED ORDER — POLYETHYLENE GLYCOL 3350 17 GM/SCOOP PO POWD: 17.0000 g | Freq: Every day | ORAL | Status: DC | PRN

## 2012-10-14 MED ORDER — OMEPRAZOLE 20 MG PO CPDR: 20.0000 mg | DELAYED_RELEASE_CAPSULE | Freq: Every day | ORAL | Status: DC

## 2012-10-14 NOTE — Assessment & Plan Note (Addendum)
H/O GERD complicated by esophageal stricture s/p dilation 2012. Clinically doing well as long as continues Prilosec. RX for 90 day supply with one year of refills provided.   C/O intermittent difficulty belching. Drinks carbonated beverage which helps. If UGI symptoms worse, consider gb u/s.   OV one year with Dr. Darrick Penna.

## 2012-10-14 NOTE — Progress Notes (Signed)
Primary Care Physician: Colette Ribas, MD  Primary Gastroenterologist:  Jonette Eva, MD   Chief Complaint  Patient presents with  . Follow-up    HPI: [name] [name] is a 61 y.o. male here for f/u. Last seen in 01/2012. He has h/o gastric ulcers, distal esophageal stricture, small hh on EGD 10/2011. Taking omeprazole 20mg  daily. Doing well. Knows when he misses a dose. Has recurrent heartburn. Symptoms have presented as atypical chest pain. C/O feeling like needs to belch but cannot. Rarely happens. BM continue to be inconsistent. May have two BMs daily or go several days without. Sometimes strains. No brbpr or melena. Tried yogurt but felt it made it worse. Seems to tolerate other dairy products. Tried fiber supplement and/or stool softners sporadically.   Current Outpatient Prescriptions  Medication Sig Dispense Refill  . ALPRAZolam (XANAX) 0.5 MG tablet Take 0.5 mg by mouth at bedtime as needed. For sleep      . aspirin EC 81 MG tablet Take 1 tablet (81 mg total) by mouth daily.  150 tablet  2  . Astaxanthin 4 MG CAPS Take 4 mg by mouth daily.        Marland Kitchen atorvastatin (LIPITOR) 40 MG tablet Take 1 tablet (40 mg total) by mouth daily.  90 tablet  3  . calcium carbonate (TUMS - DOSED IN MG ELEMENTAL CALCIUM) 500 MG chewable tablet Chew 1 tablet by mouth as needed. For indigestion      . cholecalciferol (VITAMIN D) 400 UNITS TABS Take 1,000 Units by mouth daily.       . cyanocobalamin 500 MCG tablet Take 500 mcg by mouth daily.       Marland Kitchen losartan-hydrochlorothiazide (HYZAAR) 100-25 MG per tablet Take 1 tablet by mouth daily.  90 tablet  3  . nitroGLYCERIN (NITROSTAT) 0.4 MG SL tablet Place 1 tablet (0.4 mg total) under the tongue every 5 (five) minutes as needed. For chest pains  25 tablet  3  . omeprazole (PRILOSEC) 20 MG capsule TAKE ONE CAPSULE BY MOUTH TWICE DAILY **TAKE 30 MINUTES PRIOR TO MEALS**  60 capsule  5    Allergies as of 10/14/2012 - Review Complete 10/14/2012  Allergen  Reaction Noted  . Bee venom Hives and Swelling 09/24/2011  . Clopidogrel bisulfate  02/20/2010  . Penicillins  02/20/2010    ROS:  General: Negative for anorexia, weight loss, fever, chills, fatigue, weakness. ENT: Negative for hoarseness, difficulty swallowing , nasal congestion. CV: Negative for chest pain, angina, palpitations, dyspnea on exertion, peripheral edema.  Respiratory: Negative for dyspnea at rest, dyspnea on exertion, cough, sputum, wheezing.  GI: See history of present illness. GU:  Negative for dysuria, hematuria, urinary incontinence, urinary frequency, nocturnal urination.  Endo: Negative for unusual weight change.    Physical Examination:   BP 143/98  Pulse 75  Temp 98.6 F (37 C) (Temporal)  Ht 6' (1.829 m)  Wt 182 lb 9.6 oz (82.827 kg)  BMI 24.77 kg/m2  General: Well-nourished, well-developed in no acute distress.  Eyes: No icterus. Mouth: Oropharyngeal mucosa moist and pink , no lesions erythema or exudate. Lungs: Clear to auscultation bilaterally.  Heart: Regular rate and rhythm, no murmurs rubs or gallops.  Abdomen: Bowel sounds are normal, nontender, nondistended, no hepatosplenomegaly, no abdominal bruits or hernia , no rebound or guarding.  Several abdominal lipomas.  Extremities: No lower extremity edema. No clubbing or deformities. Neuro: Alert and oriented x 4   Skin: Warm and dry, no jaundice.   Psych: Alert and  cooperative, normal mood and affect.

## 2012-10-14 NOTE — Assessment & Plan Note (Signed)
H/O PUD secondary to NSAIDs/H.Pylori 2012. Doing well. Continue PPI indefinetly due to complicated GERD.

## 2012-10-14 NOTE — Assessment & Plan Note (Signed)
Miralax one capful on days you do not have a BM.

## 2012-10-14 NOTE — Patient Instructions (Addendum)
Prescription for Prilosec sent to [facility], 90 day supply with one year of refills. Prescription for Miralax generic sent to [facility]. You insurance may or may not cover it. If not, you can buy Miralax over the counter. Take one capful daily especially if you do not have a bowel movement that day.  Call if persistent problems with gas.

## 2012-10-15 NOTE — Progress Notes (Signed)
Faxed to PCP

## 2012-11-12 NOTE — Progress Notes (Signed)
REVIEWED.  

## 2012-12-29 ENCOUNTER — Encounter: Payer: Self-pay | Admitting: Internal Medicine

## 2012-12-29 ENCOUNTER — Ambulatory Visit (INDEPENDENT_AMBULATORY_CARE_PROVIDER_SITE_OTHER): Payer: Federal, State, Local not specified - PPO | Admitting: Internal Medicine

## 2012-12-29 VITALS — BP 122/88 | HR 77 | Ht 72.0 in | Wt 184.0 lb

## 2012-12-29 DIAGNOSIS — I10 Essential (primary) hypertension: Secondary | ICD-10-CM

## 2012-12-29 MED ORDER — NITROGLYCERIN 0.4 MG SL SUBL: 0.4000 mg | SUBLINGUAL_TABLET | SUBLINGUAL | Status: DC | PRN

## 2012-12-29 MED ORDER — ATORVASTATIN CALCIUM 40 MG PO TABS: 40.0000 mg | ORAL_TABLET | Freq: Every day | ORAL | Status: DC

## 2012-12-29 NOTE — Progress Notes (Addendum)
HPI Patient is a 61 yo with a history of CAD (s/p NSTEMI in aug 2008  DES x 2 to D2; DES to LAD)  Allergic to Plavix.  Also history of HL.  I saw him in clinic in December 2012. SInce 2012 he has done well  Breathing is OK  No CP  No dizziness.  No SOB.   Remains active. Allergies  Allergen Reactions  . Bee Venom Hives and Swelling  . Clopidogrel Bisulfate     Hives  . Penicillins     Pt unsure what type allergy/ was a child when had reaction    Current Outpatient Prescriptions  Medication Sig Dispense Refill  . ALPRAZolam (XANAX) 0.5 MG tablet Take 0.5 mg by mouth at bedtime as needed. For sleep      . Astaxanthin 4 MG CAPS Take 4 mg by mouth daily.        Marland Kitchen atorvastatin (LIPITOR) 40 MG tablet Take 1 tablet (40 mg total) by mouth daily.  90 tablet  3  . calcium carbonate (TUMS - DOSED IN MG ELEMENTAL CALCIUM) 500 MG chewable tablet Chew 1 tablet by mouth as needed. For indigestion      . cholecalciferol (VITAMIN D) 400 UNITS TABS Take 1,000 Units by mouth daily.       . cyanocobalamin 500 MCG tablet Take 500 mcg by mouth daily.       . nitroGLYCERIN (NITROSTAT) 0.4 MG SL tablet Place 1 tablet (0.4 mg total) under the tongue every 5 (five) minutes as needed. For chest pains  25 tablet  3  . omeprazole (PRILOSEC) 20 MG capsule Take 1 capsule (20 mg total) by mouth daily before breakfast.  90 capsule  3  . polyethylene glycol powder (GLYCOLAX/MIRALAX) powder Take 17 g by mouth daily as needed.  527 g  11  . losartan-hydrochlorothiazide (HYZAAR) 100-25 MG per tablet Take 1 tablet by mouth daily.  90 tablet  3    Past Medical History  Diagnosis Date  . CAD (coronary artery disease)   . HTN (hypertension)   . Hyperlipidemia   . Avascular necrosis of bone of hip     right  . Anxiety   . Paresthesias   . GERD (gastroesophageal reflux disease)     Past Surgical History  Procedure Date  . Tonsillectomy age 32  . Total hip arthroplasty age 62    right hip for avascular necrosis of hip  and spine  . Colonoscopy 2011 NUR [facility] SCREENING d50 v5    MILD [place] TICS, ? PREP ARTIFACT V. PROCTITIS-rX:[place]  . Cardiac catheterization     3 stents placed  . Esophagogastroduodenoscopy 10/03/11    small hiatal hernia/gastric ulcers/stricutre in distal esophagus    Family History  Problem Relation Age of Onset  . Dementia Mother   . Prostate cancer Father 73  . Lung cancer Sister 62  . Colon cancer Neg Hx   . Colon polyps Neg Hx   . Anesthesia problems Neg Hx   . Hypotension Neg Hx   . Malignant hyperthermia Neg Hx   . Pseudochol deficiency Neg Hx     History   Social History  . Marital Status: Single    Spouse Name: N/A    Number of Children: N/A  . Years of Education: N/A   Occupational History  . Not on file.   Social History Main Topics  . Smoking status: Former Smoker -- 1.0 packs/day for 30 years    Types: Cigarettes  .  Smokeless tobacco: Never Used  . Alcohol Use: No  . Drug Use: No  . Sexually Active: Not on file   Other Topics Concern  . Not on file   Social History Narrative  . No narrative on file    Review of Systems:  All systems reviewed.  They are negative to the above problem except as previously stated.  Vital Signs: BP 122/88  Pulse 77  Ht 6' (1.829 m)  Wt 184 lb (83.462 kg)  BMI 24.95 kg/m2  SpO2 98%  Physical Exam Patient is in NAD HEENT:  Normocephalic, atraumatic. EOMI, PERRLA.  Neck: JVP is normal.  No bruits.  Lungs: clear to auscultation. No rales no wheezes.  Heart: Regular rate and rhythm. Normal S1, S2. No S3.   No significant murmurs. PMI not displaced.  Abdomen:  Supple, nontender. Normal bowel sounds. No masses. No hepatomegaly.  Extremities:   Good distal pulses throughout. No lower extremity edema.  Musculoskeletal :moving all extremities.  Neuro:   alert and oriented x3.  CN II-XII grossly intact.  EKG  SR 79 bpm. Nonspecific ST T wave changes. Assessment and Plan:  1.  CAD  No symptoms to suggest angina  Keep  on same regimen.    2.  HL  Keep on statin.  Will get labs from Dr. Phillips Odor  Encouraged patient to stay active

## 2013-02-24 ENCOUNTER — Other Ambulatory Visit: Payer: Self-pay | Admitting: Internal Medicine

## 2013-05-22 IMAGING — CT CT CERVICAL SPINE W/O CM
5 of 6 series · 16 of 33 positions shown, 17 images · non-contrast
Comparison: None.

CLINICAL DATA: Neck pain.  Suspected spinous process fracture.

CT CERVICAL SPINE WITHOUT CONTRAST
TECHNIQUE: Multidetector CT imaging of the cervical spine was
performed. Multiplanar CT image reconstructions were also
generated.

[Series 2: c spine bone · axial · 0.23mm/px · z∈[-131,-78]mm · 2 of 63 slices shown]
[im 21/63  bone]
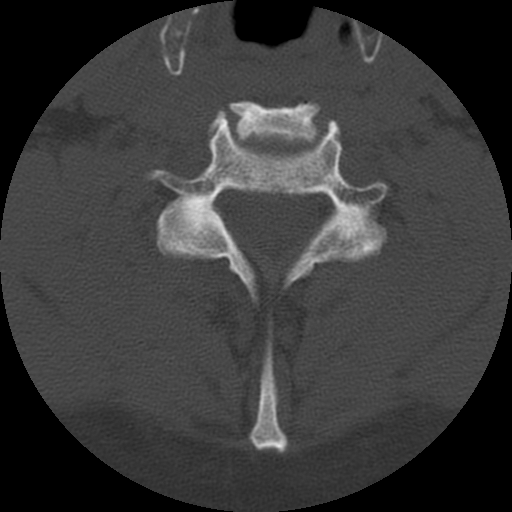
[im 42/63  bone]
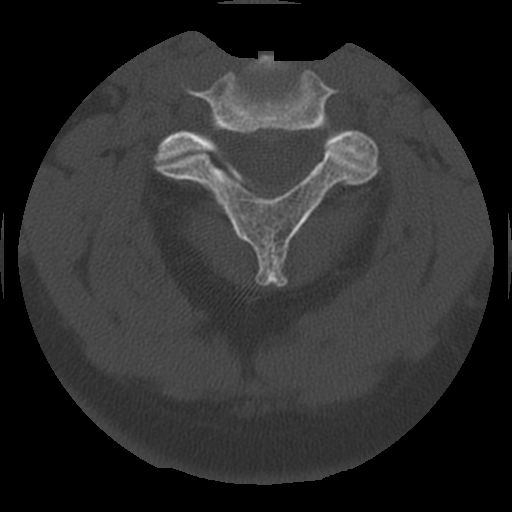

[Series 3: c spine soft · axial · 0.23mm/px · z∈[-144,-66]mm · 3 of 63 slices shown]
[im 16/63  soft-tissue]
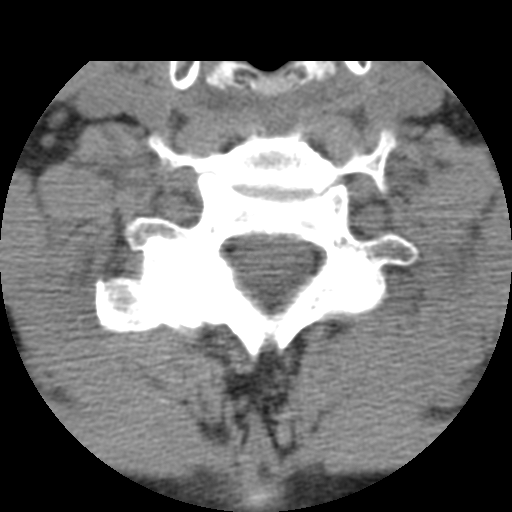
[im 32/63  soft-tissue]
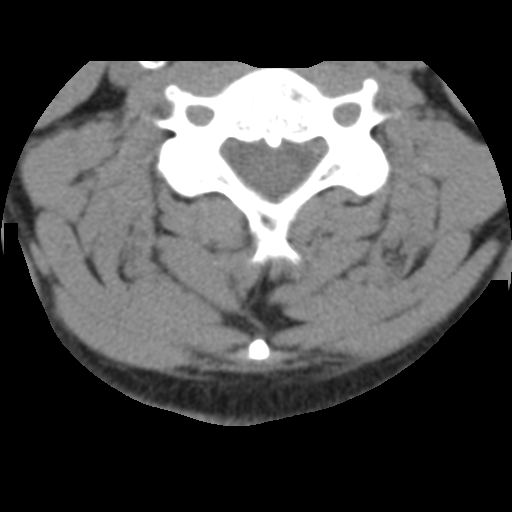
[im 47/63  soft-tissue]
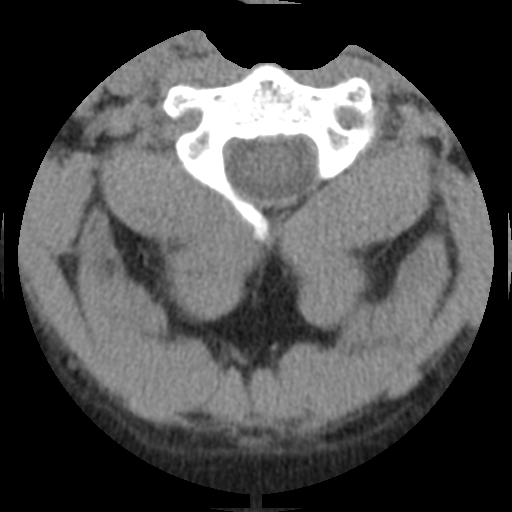

[Series 100: bone · axial · 0.27mm/px · z∈[-144,-66]mm · 3 of 63 slices shown, 4 images]
[im 16/63  soft-tissue]
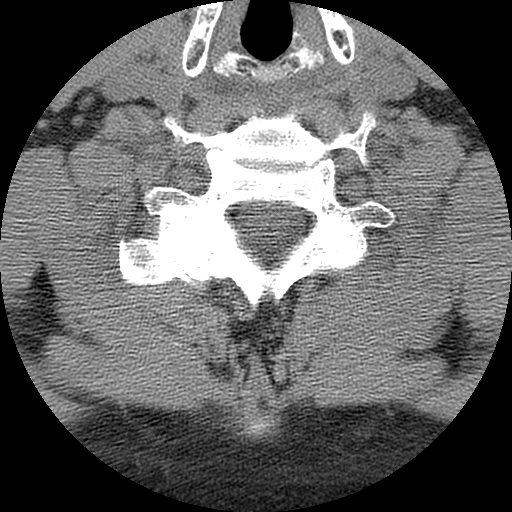
[im 16/63  bone]
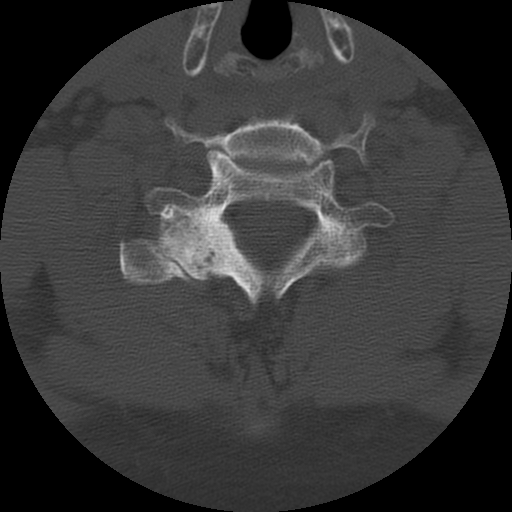
[im 32/63  bone]
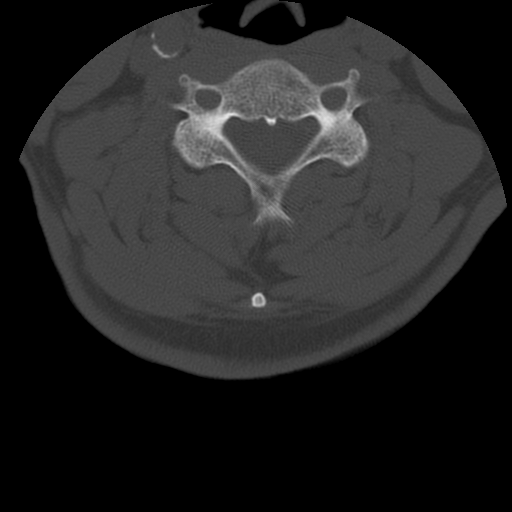
[im 47/63  bone]
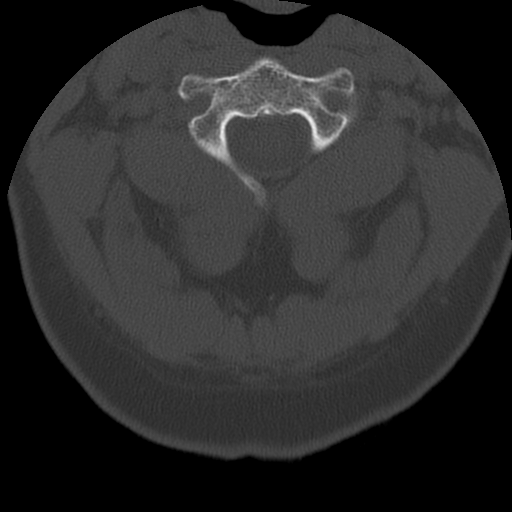

[Series 103: cor lower · coronal · 0.31mm/px · 3 of 44 slices shown]
[im 9/44  bone]
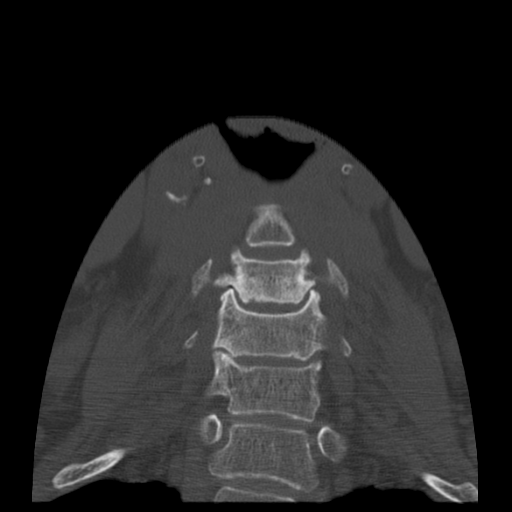
[im 18/44  bone]
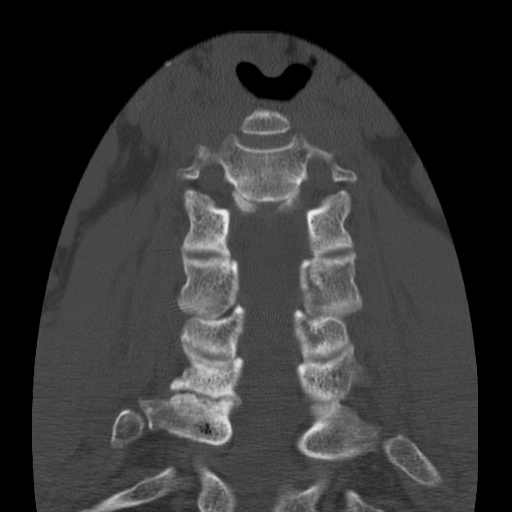
[im 26/44  bone]
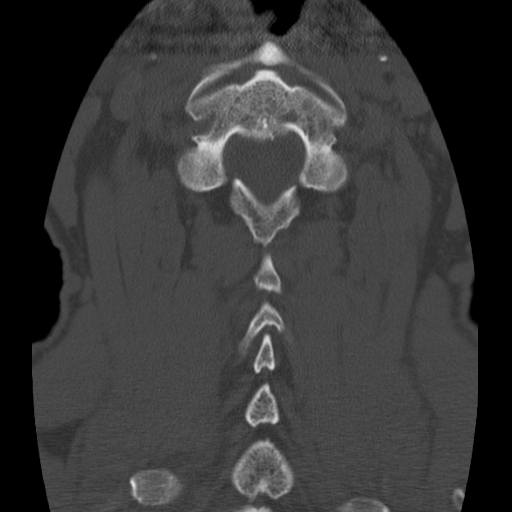

[Series 104: sag · sagittal · 0.31mm/px · 5 of 44 slices shown]
[im 8/44  bone]
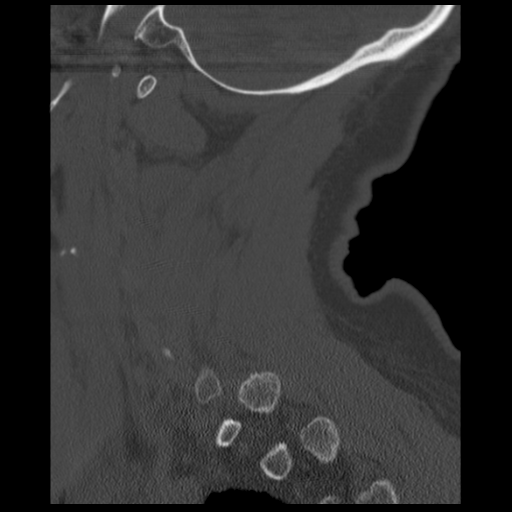
[im 15/44  bone]
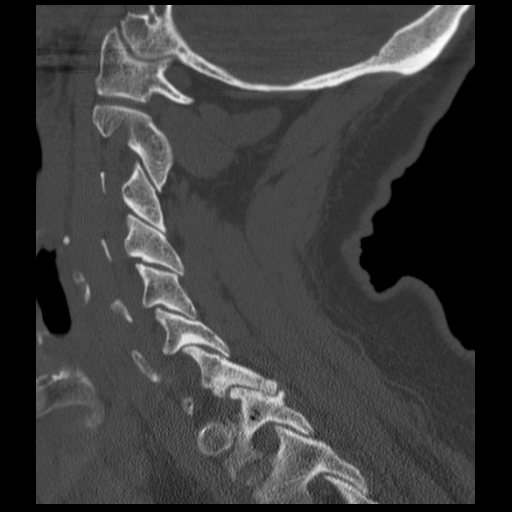
[im 22/44  bone]
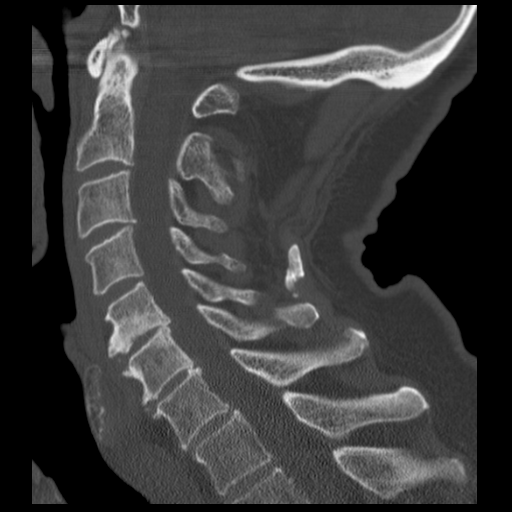
[im 29/44  bone]
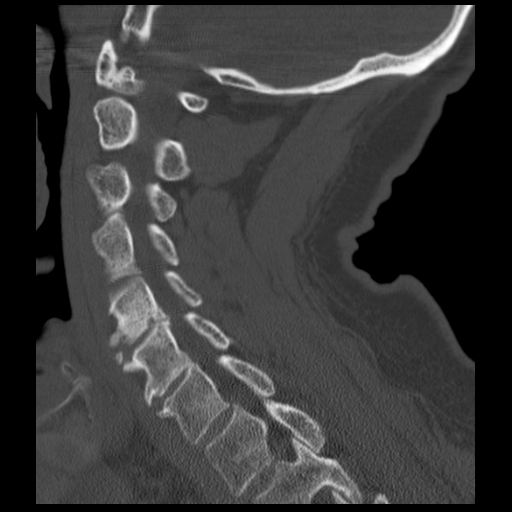
[im 36/44  bone]
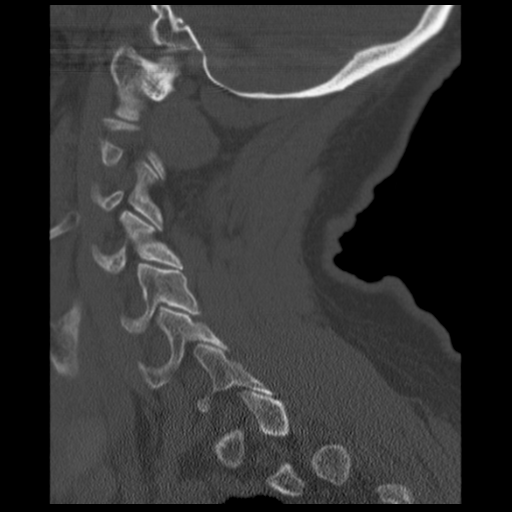

[16 of 33 positions shown; findings below may reference images not displayed]

FINDINGS: Anatomic alignment is present.  There is disc space
narrowing at C5-6 and C6-7.  There is moderate facet arthropathy
particularly at C7-T1.  There is a central protrusion at C5-6,
partially calcified.  There is a tiny protrusion at C6-7 on the
left with vacuum disc phenomenon.

There is a heavily calcified or ossified posterior nuchal ligament
directly dorsal to the spinous process of C5. Cross-sectional
measurements are 13 x 6 x 7 mm.  This appears unlikely to represent
an avulsed spinous process from C5, as the native process is bifid
with both right and left limbs intact.  I do not see other likely
donor sites.  I suspect it represents an old injury of the nuchal
ligament with dystrophic calcification.   It has not clearly
originated from C4 or C6.  If there are no clinical
contraindications, MRI could be helpful as a additional imaging
study  to evaluate for ligamentous injury.
IMPRESSION: There is no visible cervical spine fracture suggesting a recent
unstable injury.

13 x 6 x 7 mm calcification of the posterior nuchal ligament may
represent an old injury; it does not appear to represent an avulsed
C5 spinous process.

Degenerative disc disease C5-6 and  C6-7 as described.

## 2013-08-07 ENCOUNTER — Other Ambulatory Visit: Payer: Self-pay | Admitting: Internal Medicine

## 2013-08-13 ENCOUNTER — Emergency Department (HOSPITAL_COMMUNITY)
Admission: EM | Admit: 2013-08-13 | Discharge: 2013-08-13 | Disposition: A | Discharge status: Home / Self Care | Payer: Federal, State, Local not specified - PPO | Attending: Emergency Medicine | Admitting: Emergency Medicine

## 2013-08-13 ENCOUNTER — Emergency Department (HOSPITAL_COMMUNITY): Payer: Federal, State, Local not specified - PPO

## 2013-08-13 ENCOUNTER — Encounter (HOSPITAL_COMMUNITY): Payer: Self-pay | Admitting: *Deleted

## 2013-08-13 DIAGNOSIS — Z79899 Other long term (current) drug therapy: Secondary | ICD-10-CM | POA: Insufficient documentation

## 2013-08-13 DIAGNOSIS — R142 Eructation: Secondary | ICD-10-CM | POA: Insufficient documentation

## 2013-08-13 DIAGNOSIS — K219 Gastro-esophageal reflux disease without esophagitis: Secondary | ICD-10-CM | POA: Insufficient documentation

## 2013-08-13 DIAGNOSIS — Z880 Allergy status to penicillin: Secondary | ICD-10-CM | POA: Insufficient documentation

## 2013-08-13 DIAGNOSIS — E785 Hyperlipidemia, unspecified: Secondary | ICD-10-CM | POA: Insufficient documentation

## 2013-08-13 DIAGNOSIS — R141 Gas pain: Secondary | ICD-10-CM | POA: Insufficient documentation

## 2013-08-13 DIAGNOSIS — I10 Essential (primary) hypertension: Secondary | ICD-10-CM | POA: Insufficient documentation

## 2013-08-13 DIAGNOSIS — R0602 Shortness of breath: Secondary | ICD-10-CM | POA: Insufficient documentation

## 2013-08-13 DIAGNOSIS — I2510 Atherosclerotic heart disease of native coronary artery without angina pectoris: Secondary | ICD-10-CM | POA: Insufficient documentation

## 2013-08-13 DIAGNOSIS — R450 Nervousness: Secondary | ICD-10-CM | POA: Insufficient documentation

## 2013-08-13 DIAGNOSIS — Z8739 Personal history of other diseases of the musculoskeletal system and connective tissue: Secondary | ICD-10-CM | POA: Insufficient documentation

## 2013-08-13 DIAGNOSIS — Z872 Personal history of diseases of the skin and subcutaneous tissue: Secondary | ICD-10-CM | POA: Insufficient documentation

## 2013-08-13 DIAGNOSIS — Z87891 Personal history of nicotine dependence: Secondary | ICD-10-CM | POA: Insufficient documentation

## 2013-08-13 DIAGNOSIS — Z9861 Coronary angioplasty status: Secondary | ICD-10-CM | POA: Insufficient documentation

## 2013-08-13 DIAGNOSIS — F411 Generalized anxiety disorder: Secondary | ICD-10-CM | POA: Insufficient documentation

## 2013-08-13 DIAGNOSIS — R143 Flatulence: Secondary | ICD-10-CM | POA: Insufficient documentation

## 2013-08-13 DIAGNOSIS — R079 Chest pain, unspecified: Secondary | ICD-10-CM

## 2013-08-13 DIAGNOSIS — R1310 Dysphagia, unspecified: Secondary | ICD-10-CM | POA: Insufficient documentation

## 2013-08-13 LAB — CBC WITH DIFFERENTIAL/PLATELET
Basophils Absolute: 0 10*3/uL (ref 0.0–0.1)
Eosinophils Relative: 5 % (ref 0–5)
Lymphocytes Relative: 38 % (ref 12–46)
Lymphs Abs: 2 10*3/uL (ref 0.7–4.0)
MCV: 89.7 fL (ref 78.0–100.0)
Neutro Abs: 2.8 10*3/uL (ref 1.7–7.7)
Neutrophils Relative %: 52 % (ref 43–77)
Platelets: 165 10*3/uL (ref 150–400)
RBC: 4.37 MIL/uL (ref 4.22–5.81)
RDW: 13.8 % (ref 11.5–15.5)
WBC: 5.4 10*3/uL (ref 4.0–10.5)

## 2013-08-13 LAB — BASIC METABOLIC PANEL
CO2: 24 mEq/L (ref 19–32)
Calcium: 8.9 mg/dL (ref 8.4–10.5)
Creatinine, Ser: 0.91 mg/dL (ref 0.50–1.35)
GFR calc Af Amer: >90 mL/min (ref >90)
GFR calc non Af Amer: 89 mL/min — ABNORMAL LOW (ref >90)
Sodium: 134 mEq/L — ABNORMAL LOW (ref 135–145)

## 2013-08-13 LAB — D-DIMER, QUANTITATIVE: D-Dimer, Quant: 0.33 ug/mL-FEU (ref 0.00–0.48)

## 2013-08-13 LAB — TROPONIN I: Troponin I: <0.3 ng/mL (ref <0.30)

## 2013-08-13 MED ORDER — ACETAMINOPHEN 325 MG PO TABS
650.0000 mg | ORAL_TABLET | Freq: Once | ORAL | Status: AC
  Administered 2013-08-13: 650 mg via ORAL
  Filled 2013-08-13: qty 2

## 2013-08-13 NOTE — ED Notes (Signed)
Pt c/o centralized chest pain that began approximately 45 mins prior to arrival to ED. Pt states he become "mildly SOB" in route to ED. Pt denies diaphoresis, lightheaded, dizziness. Pt has hx of MI. Pt took one nitro SL tab pta but denies any relief.

## 2013-08-13 NOTE — ED Notes (Signed)
Pain across center chest began PTA. Pain is described as non radiating, sharp, steady ache. Pain is worse with deep breathing. Pt took NTG x 1 PTA with no relief.

## 2013-08-13 NOTE — ED Provider Notes (Signed)
CSN: [id number]     Arrival date & time 08/13/13  1454 History    This chart was scribed for Joya Gaskins, MD,  by Ashley Jacobs, ED Scribe. The patient was seen in room APA12/APA12 and the patient's care was started at 3:19 PM.  First MD Initiated Contact with Patient 08/13/13 1505     Chief Complaint  Patient presents with  . Chest Pain    Patient is a 62 y.o. male presenting with chest pain. The history is provided by the patient and medical records. No language interpreter was used.  Chest Pain Pain location:  Substernal area, L chest and R chest Pain radiates to:  Does not radiate Pain radiates to the back: no   Pain severity:  Moderate Onset quality:  Sudden Duration:  1 hour Timing:  Constant Chronicity:  New Context: breathing, movement and raising an arm   Context: not lifting   Relieved by:  Certain positions Worsened by:  Movement and deep breathing Ineffective treatments:  Nitroglycerin and aspirin Associated symptoms: shortness of breath   Associated symptoms: no abdominal pain, no cough, no dizziness, no fever, no headache, no nausea, not vomiting and no weakness    HPI HPI Comments: [name] [name] is a 63 y.o. male who presents to the Emergency Department complaining of diffuse sharp upper chest pain that presented the hour before arrival.  He admits to lifting heavy door this morning but was several hrs prior to CP - he had no CP or weakness while lifting door  As associated symptoms he mentions trouble swallowing, SOB, CP,belching and flatus. He describes a similar episode that occurred in the past as vertical radiating chest pain. Pt mentions that the pain is exasperated with movement in bed, deep breathing and relieved with certain positions. PTA, pt had NTG x1 and an a Asprin.  In August of  2008 pt had heart stents implanted.  Pt is allergic to Plavix, bee venom and possibly Vicodin. Pt denies any clotting disorders.     Past Medical History   Diagnosis Date  . CAD (coronary artery disease)   . HTN (hypertension)   . Hyperlipidemia   . Avascular necrosis of bone of hip     right  . Anxiety   . Paresthesias   . GERD (gastroesophageal reflux disease)    Past Surgical History  Procedure Laterality Date  . Tonsillectomy  age 39  . Total hip arthroplasty  age 33    right hip for avascular necrosis of hip and spine  . Colonoscopy  2011 NUR [facility] SCREENING d50 v5    MILD [place] TICS, ? PREP ARTIFACT V. PROCTITIS-rX:[place]  . Cardiac catheterization      3 stents placed  . Esophagogastroduodenoscopy  10/03/11    small hiatal hernia/gastric ulcers/stricutre in distal esophagus   Family History  Problem Relation Age of Onset  . Dementia Mother   . Prostate cancer Father 33  . Lung cancer Sister 47  . Colon cancer Neg Hx   . Colon polyps Neg Hx   . Anesthesia problems Neg Hx   . Hypotension Neg Hx   . Malignant hyperthermia Neg Hx   . Pseudochol deficiency Neg Hx    History  Substance Use Topics  . Smoking status: Former Smoker -- 1.00 packs/day for 30 years    Types: Cigarettes  . Smokeless tobacco: Never Used  . Alcohol Use: No    Review of Systems  Constitutional: Negative for fever.  Respiratory: Positive for  shortness of breath. Negative for cough.   Cardiovascular: Positive for chest pain. Negative for leg swelling.  Gastrointestinal: Negative for nausea, vomiting and abdominal pain.  Neurological: Negative for dizziness, weakness and headaches.  Hematological: Does not bruise/bleed easily.  Psychiatric/Behavioral: The patient is nervous/anxious.   All other systems reviewed and are negative.    Allergies  Bee venom; Clopidogrel bisulfate; and Penicillins  Home Medications   Current Outpatient Rx  Name  Route  Sig  Dispense  Refill  . ALPRAZolam (XANAX) 0.5 MG tablet   Oral   Take 0.5 mg by mouth at bedtime as needed. For sleep         . Astaxanthin 4 MG CAPS   Oral   Take 4 mg by mouth daily.            Marland Kitchen atorvastatin (LIPITOR) 40 MG tablet   Oral   Take 1 tablet (40 mg total) by mouth daily.   90 tablet   3   . calcium carbonate (TUMS - DOSED IN MG ELEMENTAL CALCIUM) 500 MG chewable tablet   Oral   Chew 1 tablet by mouth as needed. For indigestion         . cholecalciferol (VITAMIN D) 400 UNITS TABS   Oral   Take 1,000 Units by mouth daily.          . cyanocobalamin 500 MCG tablet   Oral   Take 500 mcg by mouth daily.          Marland Kitchen losartan-hydrochlorothiazide (HYZAAR) 100-25 MG per tablet      TAKE ONE TABLET BY MOUTH ONCE DAILY   90 tablet   0   . nitroGLYCERIN (NITROSTAT) 0.4 MG SL tablet   Sublingual   Place 1 tablet (0.4 mg total) under the tongue every 5 (five) minutes as needed. For chest pains   25 tablet   3   . omeprazole (PRILOSEC) 20 MG capsule   Oral   Take 1 capsule (20 mg total) by mouth daily before breakfast.   90 capsule   3   . polyethylene glycol powder (GLYCOLAX/MIRALAX) powder   Oral   Take 17 g by mouth daily as needed.   527 g   11    BP 157/92  Pulse 96  Temp(Src) 98.4 F (36.9 C) (Oral)  Resp 16  Ht 6' (1.829 m)  Wt 178 lb (80.74 kg)  BMI 24.14 kg/m2  SpO2 98% Physical Exam CONSTITUTIONAL: Well developed/well nourished HEAD: Normocephalic/atraumatic EYES: EOMI/PERRL ENMT: Mucous membranes moist NECK: supple no meningeal signs SPINE:entire spine nontender CV: S1/S2 noted, no murmurs/rubs/gallops noted LUNGS: Lungs are clear to auscultation bilaterally, no apparent distress Chest - nontender to palpation ,no signs of trauma ABDOMEN: soft, nontender, no rebound or guarding GU:no cva tenderness NEURO: Pt is awake/alert, moves all extremitiesx4 EXTREMITIES: pulses normal, full ROM SKIN: warm, color normal PSYCH: no abnormalities of mood noted ED Course  DIAGNOSTIC STUDIES: Oxygen Saturation is 98% on room air, normal by my interpretation.    COORDINATION OF CARE: 3:24 PM Discussed course of care with pt . Pt  understands and agrees. Procedures   Pt improved.  Most of his pain was sharp/pleuritic.  Low suspicion for ACS, do not feel further EKG/troponins required.  D-dimer negative (low risk otherwise for PE) He had some pain with just movement in the bed.   He denies pain/weakness upon ambulation I feel he is safe for d/c I doubt PE at this time  MDM  xrays reviewed  and considered Labs/vital reviewed and considered Previous records reviewed and considered Nursing notes including past medical history and social history reviewed and considered in documentation     Date: 08/13/2013 1502  Rate: 91  Rhythm: normal sinus rhythm  QRS Axis: normal  Intervals: normal  ST/T Wave abnormalities: normal  Conduction Disutrbances:none  Narrative Interpretation:   Old EKG Reviewed: unchanged from 03/2011     I personally performed the services described in this documentation, which was scribed in my presence. The recorded information has been reviewed and is accurate.       Joya Gaskins, MD 08/13/13 [phone]

## 2013-09-04 ENCOUNTER — Ambulatory Visit: Payer: Federal, State, Local not specified - PPO | Admitting: Physician Assistant

## 2013-09-30 ENCOUNTER — Encounter: Payer: Self-pay | Admitting: Gastroenterology

## 2013-11-02 ENCOUNTER — Other Ambulatory Visit: Payer: Self-pay

## 2013-11-02 MED ORDER — LOSARTAN POTASSIUM-HCTZ 100-25 MG PO TABS: 0.5000 | ORAL_TABLET | Freq: Every morning | ORAL | Status: DC

## 2013-11-24 ENCOUNTER — Ambulatory Visit (HOSPITAL_COMMUNITY)
Admission: RE | Admit: 2013-11-24 | Discharge: 2013-11-24 | Disposition: A | Discharge status: Home / Self Care | Payer: Federal, State, Local not specified - PPO | Source: Ambulatory Visit | Attending: General Surgery | Admitting: General Surgery

## 2013-11-24 ENCOUNTER — Other Ambulatory Visit (HOSPITAL_COMMUNITY): Payer: Self-pay | Admitting: General Surgery

## 2013-11-24 DIAGNOSIS — M79602 Pain in left arm: Secondary | ICD-10-CM

## 2013-11-24 DIAGNOSIS — M79609 Pain in unspecified limb: Secondary | ICD-10-CM | POA: Insufficient documentation

## 2013-12-02 ENCOUNTER — Other Ambulatory Visit: Payer: Self-pay

## 2013-12-02 MED ORDER — OMEPRAZOLE 20 MG PO CPDR: 20.0000 mg | DELAYED_RELEASE_CAPSULE | Freq: Every evening | ORAL | Status: DC

## 2014-01-11 ENCOUNTER — Encounter: Payer: Self-pay | Admitting: Internal Medicine

## 2014-01-11 ENCOUNTER — Ambulatory Visit (INDEPENDENT_AMBULATORY_CARE_PROVIDER_SITE_OTHER): Payer: Federal, State, Local not specified - PPO | Admitting: Internal Medicine

## 2014-01-11 ENCOUNTER — Encounter (INDEPENDENT_AMBULATORY_CARE_PROVIDER_SITE_OTHER): Payer: Self-pay

## 2014-01-11 VITALS — BP 118/84 | HR 62 | Ht 72.0 in | Wt 181.0 lb

## 2014-01-11 DIAGNOSIS — I2510 Atherosclerotic heart disease of native coronary artery without angina pectoris: Secondary | ICD-10-CM

## 2014-01-11 DIAGNOSIS — Z79899 Other long term (current) drug therapy: Secondary | ICD-10-CM

## 2014-01-11 DIAGNOSIS — E785 Hyperlipidemia, unspecified: Secondary | ICD-10-CM

## 2014-01-11 DIAGNOSIS — I10 Essential (primary) hypertension: Secondary | ICD-10-CM

## 2014-01-11 LAB — BASIC METABOLIC PANEL
BUN: 15 mg/dL (ref 6–23)
CALCIUM: 9.1 mg/dL (ref 8.4–10.5)
CO2: 28 mEq/L (ref 19–32)
Chloride: 102 mEq/L (ref 96–112)
Creatinine, Ser: 0.9 mg/dL (ref 0.4–1.5)
GFR: 91.71 mL/min (ref >60.00)
GLUCOSE: 88 mg/dL (ref 70–99)
Potassium: 3.7 mEq/L (ref 3.5–5.1)
SODIUM: 138 meq/L (ref 135–145)

## 2014-01-11 LAB — LIPID PANEL
CHOL/HDL RATIO: 4
Cholesterol: 142 mg/dL (ref 0–200)
HDL: 36.5 mg/dL — ABNORMAL LOW (ref >39.00)
LDL Cholesterol: 84 mg/dL (ref 0–99)
TRIGLYCERIDES: 108 mg/dL (ref 0.0–149.0)
VLDL: 21.6 mg/dL (ref 0.0–40.0)

## 2014-01-11 LAB — CBC
HEMATOCRIT: 43.6 % (ref 39.0–52.0)
Hemoglobin: 14.6 g/dL (ref 13.0–17.0)
MCHC: 33.5 g/dL (ref 30.0–36.0)
MCV: 89.8 fl (ref 78.0–100.0)
Platelets: 219 10*3/uL (ref 150.0–400.0)
RBC: 4.85 Mil/uL (ref 4.22–5.81)
RDW: 13.9 % (ref 11.5–14.6)
WBC: 10.7 10*3/uL — ABNORMAL HIGH (ref 4.5–10.5)

## 2014-01-11 LAB — AST: AST: 22 U/L (ref 0–37)

## 2014-01-11 NOTE — Progress Notes (Addendum)
HPI Patient is a 63 yo with a history of CAD (s/p NSTEMI in aug 2008  DES x 2 to D2; DES to LAD)  Allergic to Plavix.  Also history of HL.  I saw him in clinic in December 2013. SInce 2013 he has done well  Breathing is OK  No CP  No dizziness.  No SOB.   Remains very active. Building a barn Allergies  Allergen Reactions  . Bee Venom Hives and Swelling  . Clopidogrel Bisulfate Hives    Hives  . Penicillins     Pt unsure what type allergy/ was a child when had reaction    Current Outpatient Prescriptions  Medication Sig Dispense Refill  . aspirin 325 MG tablet Take 325 mg by mouth every morning.      . Astaxanthin 4 MG CAPS Take 4 mg by mouth every other day.       Marland Kitchen atorvastatin (LIPITOR) 40 MG tablet Take 1 tablet (40 mg total) by mouth daily.  90 tablet  3  . Cholecalciferol (VITAMIN D) 2000 UNITS tablet Take 2,000 Units by mouth daily.      . Coenzyme Q10 200 MG capsule Take 200 mg by mouth daily.      Marland Kitchen losartan-hydrochlorothiazide (HYZAAR) 100-25 MG per tablet Take 0.5 tablets by mouth every morning.  90 tablet  0  . nitroGLYCERIN (NITROSTAT) 0.4 MG SL tablet Place 1 tablet (0.4 mg total) under the tongue every 5 (five) minutes as needed. For chest pains  25 tablet  3  . omeprazole (PRILOSEC) 20 MG capsule Take 1 capsule (20 mg total) by mouth every evening.  30 capsule  5  . Probiotic Product (PROBIOTIC DAILY PO) Take by mouth daily.       No current facility-administered medications for this visit.    Past Medical History  Diagnosis Date  . CAD (coronary artery disease)   . HTN (hypertension)   . Hyperlipidemia   . Avascular necrosis of bone of hip     right  . Anxiety   . Paresthesias   . GERD (gastroesophageal reflux disease)     Past Surgical History  Procedure Laterality Date  . Tonsillectomy  age 38  . Total hip arthroplasty  age 82    right hip for avascular necrosis of hip and spine  . Colonoscopy  2011 N[facility] SCREENING d50 v5    MILD [place] TICS, ? PREP  ARTIFACT V. PROCTITIS-rX:[place]  . Cardiac catheterization      3 stents placed  . Esophagogastroduodenoscopy  10/03/11    small hiatal hernia/gastric ulcers/stricutre in distal esophagus    Family History  Problem Relation Age of Onset  . Dementia Mother   . Prostate cancer Father 39  . Lung cancer Sister 40  . Colon cancer Neg Hx   . Colon polyps Neg Hx   . Anesthesia problems Neg Hx   . Hypotension Neg Hx   . Malignant hyperthermia Neg Hx   . Pseudochol deficiency Neg Hx     History   Social History  . Marital Status: Single    Spouse Name: N/A    Number of Children: N/A  . Years of Education: N/A   Occupational History  . Not on file.   Social History Main Topics  . Smoking status: Former Smoker -- 1.00 packs/day for 30 years    Types: Cigarettes  . Smokeless tobacco: Never Used  . Alcohol Use: No  . Drug Use: No  . Sexual Activity: Not  on file   Other Topics Concern  . Not on file   Social History Narrative  . No narrative on file    Review of Systems:  All systems reviewed.  They are negative to the above problem except as previously stated.  Vital Signs: BP 118/84  Pulse 62  Ht 6' (1.829 m)  Wt 181 lb (82.101 kg)  BMI 24.54 kg/m2  Physical Exam Patient is in NAD HEENT:  Normocephalic, atraumatic. EOMI, PERRLA.  Neck: JVP is normal.  No bruits.  Lungs: clear to auscultation. No rales no wheezes.  Heart: Regular rate and rhythm. Normal S1, S2. No S3.   No significant murmurs. PMI not displaced.  Abdomen:  Supple, nontender. Normal bowel sounds. No masses. No hepatomegaly.  Extremities:   Good distal pulses throughout. No lower extremity edema.  Musculoskeletal :moving all extremities.  Neuro:   alert and oriented x3.  CN II-XII grossly intact.  EKG  SR 62 bpm  Sl ST depression T wave inversion III, AVF Assessment and Plan:  1.  CAD  No symptoms to suggest angina  Keep on same regimen.    2.  HL  Keep on statin.  Need to get labs labs today   Forward results to Dr Hilma Favors and pt.    Encouraged patient to stay active

## 2014-01-11 NOTE — Patient Instructions (Addendum)
The current medical regimen is effective;  continue present plan and medications.  Please have blood work today.  Follow up in 1 year with Dr Harrington Challenger.  You will receive a letter in the mail 2 months before you are due.  Please call us when you receive this letter to schedule your follow up appointment.

## 2014-01-13 ENCOUNTER — Other Ambulatory Visit: Payer: Self-pay | Admitting: *Deleted

## 2014-01-13 DIAGNOSIS — E785 Hyperlipidemia, unspecified: Secondary | ICD-10-CM

## 2014-01-13 MED ORDER — ATORVASTATIN CALCIUM 80 MG PO TABS: 80.0000 mg | ORAL_TABLET | Freq: Every day | ORAL | Status: DC

## 2014-02-05 ENCOUNTER — Encounter: Payer: Self-pay | Admitting: Internal Medicine

## 2014-02-08 MED ORDER — ROSUVASTATIN CALCIUM 10 MG PO TABS: 10.0000 mg | ORAL_TABLET | Freq: Every day | ORAL | Status: DC

## 2014-02-23 ENCOUNTER — Encounter: Payer: Self-pay | Admitting: Internal Medicine

## 2014-04-21 ENCOUNTER — Encounter: Payer: Federal, State, Local not specified - PPO | Admitting: Gastroenterology

## 2014-06-08 ENCOUNTER — Telehealth: Payer: Self-pay | Admitting: Pharmacist

## 2014-06-08 ENCOUNTER — Encounter: Payer: Self-pay | Admitting: Internal Medicine

## 2014-06-08 DIAGNOSIS — E785 Hyperlipidemia, unspecified: Secondary | ICD-10-CM

## 2014-06-08 DIAGNOSIS — Z79899 Other long term (current) drug therapy: Secondary | ICD-10-CM

## 2014-06-08 MED ORDER — PRAVASTATIN SODIUM 20 MG PO TABS: 20.0000 mg | ORAL_TABLET | Freq: Every evening | ORAL | Status: DC

## 2014-06-08 NOTE — Telephone Encounter (Signed)
Patient and I disucssed options of low potency statin vs PCSK-9 inhibitor. We will try him on low dose pravastatin 20 mg qd today, and have him see me in lipid clinic in 2-3 months.  If LDL not adequately controlled, or side effects occur, will consider PCSK-9 inhibitor, as he is interested in this.  He has [place].  Lab on 8/18, and see me 08/19/14.

## 2014-06-08 NOTE — Telephone Encounter (Signed)
Patient has h/o CAD (DES 2008) and has failed multiple statins including lipitor 40-80 mg and Crestor 10 mg qd.  Dr. Hilma Favors told patient he felt his tendon pain was due to statins, so Dr. Harrington Challenger would like patient seen in lipid clinic to discuss other options.  He has a h/o low HDL as well.  PCSK-9 inhibitor may be an option.  I left a message for patient to call me back.  Will need to schedule him in lipid clinic to discuss options.

## 2014-07-06 ENCOUNTER — Other Ambulatory Visit: Payer: Self-pay | Admitting: *Deleted

## 2014-07-06 MED ORDER — OMEPRAZOLE 20 MG PO CPDR: 20.0000 mg | DELAYED_RELEASE_CAPSULE | Freq: Every day | ORAL | Status: DC

## 2014-07-06 NOTE — Telephone Encounter (Signed)
Overdue for OV with [facility] only.  RX done for limited refills.

## 2014-07-06 NOTE — Telephone Encounter (Signed)
Pt request 90 day supply

## 2014-07-08 ENCOUNTER — Encounter: Payer: Self-pay | Admitting: Gastroenterology

## 2014-07-08 NOTE — Telephone Encounter (Signed)
Pt is aware of OV for 8/19 at 0930 with SF and appt card mailed

## 2014-07-12 ENCOUNTER — Encounter: Payer: Self-pay | Admitting: Gastroenterology

## 2014-07-29 ENCOUNTER — Ambulatory Visit (HOSPITAL_COMMUNITY)
Admission: RE | Admit: 2014-07-29 | Discharge: 2014-07-29 | Disposition: A | Discharge status: Home / Self Care | Payer: Federal, State, Local not specified - PPO | Source: Ambulatory Visit | Attending: Physical Medicine and Rehabilitation | Admitting: Physical Medicine and Rehabilitation

## 2014-07-29 DIAGNOSIS — M5380 Other specified dorsopathies, site unspecified: Secondary | ICD-10-CM | POA: Insufficient documentation

## 2014-07-29 DIAGNOSIS — M545 Low back pain, unspecified: Secondary | ICD-10-CM | POA: Insufficient documentation

## 2014-07-29 DIAGNOSIS — IMO0001 Reserved for inherently not codable concepts without codable children: Secondary | ICD-10-CM | POA: Insufficient documentation

## 2014-07-29 DIAGNOSIS — G8929 Other chronic pain: Secondary | ICD-10-CM

## 2014-07-29 DIAGNOSIS — M542 Cervicalgia: Secondary | ICD-10-CM | POA: Insufficient documentation

## 2014-07-29 DIAGNOSIS — M5412 Radiculopathy, cervical region: Secondary | ICD-10-CM

## 2014-07-29 NOTE — Evaluation (Signed)
Physical Therapy Evaluation  Patient Details  Name: [name] [name] MRN: [id number] Date of Birth: Jul 30, 1951  Today's Date: 07/29/2014 Time: 1303-1349 PT Time Calculation (min): 46 min Charge: evaluation             Visit#: 1 of 8  Re-eval: 08/28/14 Assessment Diagnosis: radicular cervical pain  Next MD Visit: 08/20/2014 Prior Therapy: none Past Medical History:  Past Medical History  Diagnosis Date  . CAD (coronary artery disease)   . HTN (hypertension)   . Hyperlipidemia   . Avascular necrosis of bone of hip     right  . Anxiety   . Paresthesias   . GERD (gastroesophageal reflux disease)    Past Surgical History:  Past Surgical History  Procedure Laterality Date  . Tonsillectomy  age 81  . Total hip arthroplasty  age 76    right hip for avascular necrosis of hip and spine  . Colonoscopy  2011 NUR [facility] SCREENING d50 v5    MILD [place] TICS, ? PREP ARTIFACT V. PROCTITIS-rX:[place]  . Cardiac catheterization      3 stents placed  . Esophagogastroduodenoscopy  10/03/11    small hiatal hernia/gastric ulcers/stricutre in distal esophagus    Subjective Symptoms/Limitations Symptoms: [name] [name] has had low back pain for several years.   He was diagnosed with spondylothesis.  He states that he has pain across the back of his shoulders and down his arms.  He was seen by a neurologist who did not find anything therefor the patient went to [name] [name] on his own.  The pt states that  the pain in his arms is constant and goes to his elbows.   He states that it hurts to even move his arms.   Pertinent History: Hx of low back pain; three stents, pt is on statin drugs which he feels makes his mm ache.   How long can you sit comfortably?: constant pain but will  continute to sit How long can you stand comfortably?: constant pain but will continue to stand How long can you walk comfortably?: continues to walk Patient Stated Goals: decreased pain, able to use UE without increased pain Pain  Assessment Currently in Pain?: Yes Pain Score: 3  (Pain can increase to an 5/10) Pain Location: Arm Pain Orientation: Left;Right Pain Radiating Towards: anterior pain into elbows.   Pain Onset: More than a month ago Pain Frequency: Constant Pain Relieving Factors: positional  Effect of Pain on Daily Activities: increases   Balance Screening Balance Screen Has the patient fallen in the past 6 months: Yes How many times?: 1 Has the patient had a decrease in activity level because of a fear of falling? : No Is the patient reluctant to leave their home because of a fear of falling? : No    Sensation/Coordination/Flexibility/Functional Tests Functional Tests Functional Tests: foto   Assessment Cervical AROM Cervical Flexion: wfl reps  Cervical Extension: wfl reps no change  Cervical - Right Side Bend: wnl Cervical - Left Side Bend: wnl Cervical - Right Rotation: wnl  Cervical - Left Rotation: decreased 10% Cervical Strength Cervical Extension: 5/5 Cervical - Right Side Bend: 5/5 Cervical - Left Side Bend: 5/5 Palpation Palpation: mild mm spasms and tightnss noted B cervical and scapular area  Exercise/Treatments Mobility/Balance  Posture/Postural Control Posture/Postural Control: Postural limitations Postural Limitations: forward head, rounded shoulders, increase kyphosis      Supine Exercises Other Supine Exercise: decompression exercises 1-5  Manual Therapy Manual Therapy: Other (comment) Other Manual Therapy: manual traction  Physical Therapy Assessment and Plan PT Assessment and Plan Clinical Impression Statement: Pt is a 63 yo male who has been referred for cervical and lumbar pain.  The patient states that his main concern at this time is the pain going down his arms. Exam demonstrates decreased posture control, mm spasm  and inreased pain.   Pt responded favorably to manual traction.  [name] [name] will benefit from skilled PT to decrase his sx of pain and improve  his quality of life.  Pt will benefit from skilled therapeutic intervention in order to improve on the following deficits: Pain;Improper body mechanics;Increased fascial restricitons;Increased muscle spasms Rehab Potential: Good PT Frequency: Min 2X/week PT Duration: 4 weeks PT Treatment/Interventions: Modalities;Manual techniques;Therapeutic exercise;Therapeutic activities;Patient/family education PT Plan: Pt to progress with decompression exercises, (add tubing), trial of cervcial traction static starting at 17#; Pt to progress to spinal alighnment ie B UE flexion at wall, prone scapular stabilization and cervical axial extension...manual to decrease fascial tightness     Goals Home Exercise Program Pt/caregiver will Perform Home Exercise Program:  (improved posture) PT Goal: Perform Home Exercise Program - Progress: Goal set today PT Short Term Goals Time to Complete Short Term Goals: 2 weeks PT Short Term Goal 1: Pain no greater than a 3/5 PT Short Term Goal 2: radicular sx to be to mid upper arm only PT Short Term Goal 3: Pt to be more aware of posture and it's roll in spinal pain PT Long Term Goals Time to Complete Long Term Goals: 4 weeks PT Long Term Goal 1: Pt to have no radicular sx  PT Long Term Goal 2: Pt to be I in advance HEP Long Term Goal 3: Pt pain to be no greater than a 1/10 80% of the day.   Problem List Patient Active Problem List   Diagnosis Date Noted  . Chronic radicular cervical pain 07/29/2014  . GERD (gastroesophageal reflux disease) 10/14/2012  . Constipation 10/14/2012  . Stiffness of joint, not elsewhere classified, other specified site 02/21/2012  . PUD (peptic ulcer disease) 12/21/2011  . Dysphagia 07/25/2011  . HYPERLIPIDEMIA-MIXED 08/12/2009  . ESSENTIAL HYPERTENSION, BENIGN 08/12/2009  . CAD, NATIVE VESSEL 08/12/2009  . CUTANEOUS ERUPTIONS, DRUG-INDUCED 01/09/2008  . LOCALIZED SUPERFICIAL SWELLING MASS OR LUMP 01/09/2008    PT Plan of Care PT  Home Exercise Plan: given   GP    [name] [name],[name] [name] 07/29/2014, 3:24 PM  Physician Documentation Your signature is required to indicate approval of the treatment plan as stated above.  Please sign and either send electronically or make a copy of this report for your files and return this physician signed original.   Please mark one 1.__approve of plan  2. ___approve of plan with the following conditions.   ______________________________                                                          _____________________ Physician Signature  Date  

## 2014-08-03 ENCOUNTER — Ambulatory Visit (HOSPITAL_COMMUNITY)
Admission: RE | Admit: 2014-08-03 | Discharge: 2014-08-03 | Disposition: A | Discharge status: Home / Self Care | Payer: Federal, State, Local not specified - PPO | Source: Ambulatory Visit | Attending: Physical Medicine and Rehabilitation | Admitting: Physical Medicine and Rehabilitation

## 2014-08-03 DIAGNOSIS — M542 Cervicalgia: Secondary | ICD-10-CM | POA: Diagnosis not present

## 2014-08-03 DIAGNOSIS — M545 Low back pain, unspecified: Secondary | ICD-10-CM | POA: Diagnosis not present

## 2014-08-03 DIAGNOSIS — IMO0001 Reserved for inherently not codable concepts without codable children: Secondary | ICD-10-CM | POA: Insufficient documentation

## 2014-08-03 DIAGNOSIS — M5380 Other specified dorsopathies, site unspecified: Secondary | ICD-10-CM | POA: Insufficient documentation

## 2014-08-03 NOTE — Progress Notes (Signed)
Physical Therapy Treatment Patient Details  Name: [name] [name] MRN: [id number] Date of Birth: 05/13/1951  Today's Date: 08/03/2014 Time: 1520-1604 PT Time Calculation (min): 44 min Charge:  There ex [id number]; C-tx  And [id number]  Visit#: 2 of 8  Re-eval: 08/28/14    Subjective: Symptoms/Limitations Symptoms: Pt states that he tried to complete the exercises once a day.  Pain Assessment Currently in Pain?: Yes Pain Score: 3  Pain Location: Arm Pain Orientation: Right;Left    Exercise/Treatments   Supine Exercises Other Supine Exercise: decompression exercises 1-5 Other Supine Exercise: decomressive exercises t-band   Modalities Modalities: Moist Heat;Traction Moist Heat Therapy Number Minutes Moist Heat: 17 Minutes Moist Heat Location:  (lower cervical/upper thoracic) Traction Type of Traction: Cervical Max (lbs): 17 Hold Time: static Time: 15  Physical Therapy Assessment and Plan PT Assessment and Plan Clinical Impression Statement: Pt reviewed decompression exercises 1-5; Pt was then instructed in t-band decompression exercises and was placed on mechanical traction. PT Plan: assess how traction did to decrease patients sx as well as begin standing postural t-band exercises.     Goals  progressing  Problem List Patient Active Problem List   Diagnosis Date Noted  . Chronic radicular cervical pain 07/29/2014  . GERD (gastroesophageal reflux disease) 10/14/2012  . Constipation 10/14/2012  . Stiffness of joint, not elsewhere classified, other specified site 02/21/2012  . PUD (peptic ulcer disease) 12/21/2011  . Dysphagia 07/25/2011  . HYPERLIPIDEMIA-MIXED 08/12/2009  . ESSENTIAL HYPERTENSION, BENIGN 08/12/2009  . CAD, NATIVE VESSEL 08/12/2009  . CUTANEOUS ERUPTIONS, DRUG-INDUCED 01/09/2008  . LOCALIZED SUPERFICIAL SWELLING MASS OR LUMP 01/09/2008       GP    [name] [name],CINDY 08/03/2014, 4:08 PM

## 2014-08-04 ENCOUNTER — Ambulatory Visit: Payer: Federal, State, Local not specified - PPO | Admitting: Gastroenterology

## 2014-08-05 ENCOUNTER — Ambulatory Visit (HOSPITAL_COMMUNITY): Payer: Federal, State, Local not specified - PPO | Admitting: Physical Therapy

## 2014-08-10 ENCOUNTER — Ambulatory Visit (HOSPITAL_COMMUNITY)
Admission: RE | Admit: 2014-08-10 | Discharge: 2014-08-10 | Disposition: A | Discharge status: Home / Self Care | Payer: Federal, State, Local not specified - PPO | Source: Ambulatory Visit | Attending: Physical Medicine and Rehabilitation | Admitting: Physical Medicine and Rehabilitation

## 2014-08-10 DIAGNOSIS — IMO0001 Reserved for inherently not codable concepts without codable children: Secondary | ICD-10-CM | POA: Diagnosis not present

## 2014-08-10 NOTE — Progress Notes (Signed)
Physical Therapy Treatment Patient Details  Name: [name] [name] MRN: [id number] Date of Birth: Aug 02, 1951  Today's Date: 08/10/2014 Time: 7824-2353 PT Time Calculation (min): 41 min Visit#: 3 of 8  Re-eval: 08/28/14 Charges:  therex 6144-3154 (23'),  Traction with MHP 1458-1515 (17')   Subjective: Symptoms/Limitations Symptoms: Pt states he is doing well.  States the traction helped last visit.   States he still has some radiatiating pain but has decreased. Pain Assessment Currently in Pain?: Yes Pain Score: 1    Exercise/Treatments Theraband Exercises Scapula Retraction: 10 reps;Green Shoulder Extension: 10 reps;Green Rows: 10 reps;Green   UBE 5' backward level 3  Modalities Modalities: Moist Heat;Traction Moist Heat Therapy Number Minutes Moist Heat: 17 Minutes Moist Heat Location:  (lower cervical/upper thoracic) Traction Type of Traction: Cervical Min (lbs): n/a Max (lbs): 20 Hold Time: static Rest Time: n/a Time: 15  Physical Therapy Assessment and Plan PT Assessment and Plan Clinical Impression Statement: Added postural strengthening exercises in standing with theraband.  Pt required therapist facilitation to perform in correct form.  Increased traction 3# to 20# max static pull with comfort reported.   PT Plan: continue per POC.  Review decompression exercises with theraband and give theraband/instructions for HEP next visit.      Problem List Patient Active Problem List   Diagnosis Date Noted  . Chronic radicular cervical pain 07/29/2014  . GERD (gastroesophageal reflux disease) 10/14/2012  . Constipation 10/14/2012  . Stiffness of joint, not elsewhere classified, other specified site 02/21/2012  . PUD (peptic ulcer disease) 12/21/2011  . Dysphagia 07/25/2011  . HYPERLIPIDEMIA-MIXED 08/12/2009  . ESSENTIAL HYPERTENSION, BENIGN 08/12/2009  . CAD, NATIVE VESSEL 08/12/2009  . CUTANEOUS ERUPTIONS, DRUG-INDUCED 01/09/2008  . LOCALIZED SUPERFICIAL  SWELLING MASS OR LUMP 01/09/2008          Teena Irani, PTA/CLT 08/10/2014, 4:18 PM

## 2014-08-12 ENCOUNTER — Ambulatory Visit (HOSPITAL_COMMUNITY)
Admission: RE | Admit: 2014-08-12 | Discharge: 2014-08-12 | Disposition: A | Discharge status: Home / Self Care | Payer: Federal, State, Local not specified - PPO | Source: Ambulatory Visit | Attending: Physical Therapy | Admitting: Physical Therapy

## 2014-08-12 DIAGNOSIS — G8929 Other chronic pain: Secondary | ICD-10-CM

## 2014-08-12 DIAGNOSIS — IMO0001 Reserved for inherently not codable concepts without codable children: Secondary | ICD-10-CM | POA: Diagnosis not present

## 2014-08-12 DIAGNOSIS — M5412 Radiculopathy, cervical region: Principal | ICD-10-CM

## 2014-08-12 NOTE — Progress Notes (Signed)
Physical Therapy Treatment Patient Details  Name: [name] [name] MRN: [id number] Date of Birth: 08-Nov-1951  Today's Date: 08/12/2014 Time: 7673-4193 PT Time Calculation (min): 45 min  Visit#: 4 of 8  Re-eval: 08/28/14    Authorization:    Authorization Time Period:    Authorization Visit#:   of     Subjective: Symptoms/Limitations Symptoms: Pt continues to improve stating that the traction really seems to help  Pain Assessment Currently in Pain?: No/denies  Precautions/Restrictions     Exercise/Treatments Theraband Exercises Scapula Retraction: 10 reps;Green Shoulder Extension: 10 reps;Green Rows: 10 reps;Green Standing Exercises Neck Retraction: 10 reps Supine Exercises Neck Retraction: 10 reps    Prone Exercises Axial Exentsion: 10 reps Shoulder Extension: 10 reps;Weights Shoulder Extension Weights (lbs): 3# Rows: 10 reps (3#) Other Prone Exercise: double arm raise x 10      Modalities Modalities: Moist Heat;Traction Moist Heat Therapy Number Minutes Moist Heat: 17 Minutes Traction Type of Traction: Cervical Max (lbs): 20 Hold Time: static  Physical Therapy Assessment and Plan PT Assessment and Plan Clinical Impression Statement: Pt given t-band and insturctional sheet for postural exercises.  Pt had already been given supine decompressive exercises as well as t-band to perform those exersies.  Session of exercise focused on cervical retraction as pt had difficulty coordinating this motion.  Pt would benefit from a Home Cervical traction Unit; a prescription was sent to the MD>  PT Plan: check to see if cervical traction prescription has came back from MD.  It so send it and face sheet off to medical device company to determine the extent that insurance will pay.       Problem List Patient Active Problem List   Diagnosis Date Noted  . Chronic radicular cervical pain 07/29/2014  . GERD (gastroesophageal reflux disease) 10/14/2012  . Constipation  10/14/2012  . Stiffness of joint, not elsewhere classified, other specified site 02/21/2012  . PUD (peptic ulcer disease) 12/21/2011  . Dysphagia 07/25/2011  . HYPERLIPIDEMIA-MIXED 08/12/2009  . ESSENTIAL HYPERTENSION, BENIGN 08/12/2009  . CAD, NATIVE VESSEL 08/12/2009  . CUTANEOUS ERUPTIONS, DRUG-INDUCED 01/09/2008  . LOCALIZED SUPERFICIAL SWELLING MASS OR LUMP 01/09/2008       GP    RUSSELL,CINDY 08/12/2014, 3:53 PM

## 2014-08-16 ENCOUNTER — Other Ambulatory Visit (INDEPENDENT_AMBULATORY_CARE_PROVIDER_SITE_OTHER): Payer: Federal, State, Local not specified - PPO

## 2014-08-16 ENCOUNTER — Ambulatory Visit (HOSPITAL_COMMUNITY)
Admission: RE | Admit: 2014-08-16 | Discharge: 2014-08-16 | Disposition: A | Discharge status: Home / Self Care | Payer: Federal, State, Local not specified - PPO | Source: Ambulatory Visit | Attending: Physical Medicine and Rehabilitation | Admitting: Physical Medicine and Rehabilitation

## 2014-08-16 DIAGNOSIS — G8929 Other chronic pain: Secondary | ICD-10-CM

## 2014-08-16 DIAGNOSIS — Z79899 Other long term (current) drug therapy: Secondary | ICD-10-CM

## 2014-08-16 DIAGNOSIS — IMO0001 Reserved for inherently not codable concepts without codable children: Secondary | ICD-10-CM | POA: Diagnosis not present

## 2014-08-16 DIAGNOSIS — E785 Hyperlipidemia, unspecified: Secondary | ICD-10-CM

## 2014-08-16 DIAGNOSIS — M5412 Radiculopathy, cervical region: Principal | ICD-10-CM

## 2014-08-16 LAB — HEPATIC FUNCTION PANEL
ALBUMIN: 3.9 g/dL (ref 3.5–5.2)
ALT: 12 U/L (ref 0–53)
AST: 22 U/L (ref 0–37)
Alkaline Phosphatase: 70 U/L (ref 39–117)
Bilirubin, Direct: 0 mg/dL (ref 0.0–0.3)
Total Bilirubin: 0.6 mg/dL (ref 0.2–1.2)
Total Protein: 6.7 g/dL (ref 6.0–8.3)

## 2014-08-16 LAB — LIPID PANEL
CHOLESTEROL: 166 mg/dL (ref 0–200)
HDL: 30.3 mg/dL — AB (ref >39.00)
LDL Cholesterol: 108 mg/dL — ABNORMAL HIGH (ref 0–99)
NONHDL: 135.7
Total CHOL/HDL Ratio: 5
Triglycerides: 138 mg/dL (ref 0.0–149.0)
VLDL: 27.6 mg/dL (ref 0.0–40.0)

## 2014-08-16 NOTE — Progress Notes (Addendum)
Physical Therapy Treatment Patient Details  Name: [name] [name] MRN: [id number] Date of Birth: Feb 12, 1951  Today's Date: 08/16/2014 Time: 1523-1620 PT Time Calculation (min): 39 min Charge: TE 0539-7673; Traction 4193-7902  Visit#: 5 of 8  Re-eval: 08/28/14 Assessment Diagnosis: radicular cervical pain  Next MD Visit: 08/20/2014 Prior Therapy: none  Subjective: Symptoms/Limitations Symptoms: Pt continues to improve stating that the traction really seems to help   Pt reports he has not seen alot of improvements with arms burnings.  Pain Assessment Currently in Pain?: Yes Pain Score: 3  Pain Location: Arm Pain Orientation: Right;Left  Objective:  Exercise/Treatments Stretches Corner Stretch: 3 reps;30 seconds Theraband Exercises Scapula Retraction: 10 reps;Green Shoulder Extension: 10 reps;Green Rows: 10 reps;Green Standing Exercises Neck Retraction: 10 reps;Limitations Neck Retraction Limitations: against wall with towel behind Prone Exercises Axial Exentsion: 10 reps Shoulder Extension: 10 reps;Weights Shoulder Extension Weights (lbs): 3# Rows: 10 reps;Weights Rows Weights (lbs): 3  Modalities Modalities: Moist Heat;Traction Moist Heat Therapy Number Minutes Moist Heat: 17 Minutes Moist Heat Location: Other (comment) (Cervical and thoracic region during TX) Traction Type of Traction: Cervical Min (lbs): n/a Max (lbs): 20 Hold Time: static Rest Time: n/a Time: 15  Physical Therapy Assessment and Plan PT Assessment and Plan Clinical Impression Statement: Therapist facilitation to improve posture with verbal and tactile cueing to reduce forward head and forward rolled shoulders.  Resumed theraband postural strengthening exercise to assure correct form with cueng required.  Added pectoralis stretches for lengthening.  Ended session with mechanics static cervical traction with reports of decreased burning.  Not received home cervical traction referral.   PT  Plan: check to see if cervical traction prescription has came back from MD.  It so send it and face sheet off to medical device company to determine the extent that insurance will pay.      Goals PT Short Term Goals PT Short Term Goal 1: Pain no greater than a 3/5 PT Short Term Goal 1 - Progress: Progressing toward goal PT Short Term Goal 2: radicular sx to be to mid upper arm only PT Short Term Goal 2 - Progress: Progressing toward goal PT Short Term Goal 3: Pt to be more aware of posture and it's roll in spinal pain PT Short Term Goal 3 - Progress: Progressing toward goal PT Long Term Goals PT Long Term Goal 1: Pt to have no radicular sx  PT Long Term Goal 2: Pt to be I in advance HEP Long Term Goal 3: Pt pain to be no greater than a 1/10 80% of the day.   Problem List Patient Active Problem List   Diagnosis Date Noted  . Chronic radicular cervical pain 07/29/2014  . GERD (gastroesophageal reflux disease) 10/14/2012  . Constipation 10/14/2012  . Stiffness of joint, not elsewhere classified, other specified site 02/21/2012  . PUD (peptic ulcer disease) 12/21/2011  . Dysphagia 07/25/2011  . HYPERLIPIDEMIA-MIXED 08/12/2009  . ESSENTIAL HYPERTENSION, BENIGN 08/12/2009  . CAD, NATIVE VESSEL 08/12/2009  . CUTANEOUS ERUPTIONS, DRUG-INDUCED 01/09/2008  . LOCALIZED SUPERFICIAL SWELLING MASS OR LUMP 01/09/2008    PT - End of Session Activity Tolerance: Patient tolerated treatment well General Behavior During Therapy: [facility] for tasks assessed/performed  GP    Aldona Lento 08/16/2014, 4:30 PM

## 2014-08-17 ENCOUNTER — Other Ambulatory Visit: Payer: Federal, State, Local not specified - PPO

## 2014-08-17 ENCOUNTER — Ambulatory Visit (INDEPENDENT_AMBULATORY_CARE_PROVIDER_SITE_OTHER): Payer: Federal, State, Local not specified - PPO | Admitting: Pharmacist

## 2014-08-17 VITALS — Wt 179.0 lb

## 2014-08-17 DIAGNOSIS — E785 Hyperlipidemia, unspecified: Secondary | ICD-10-CM

## 2014-08-17 DIAGNOSIS — Z79899 Other long term (current) drug therapy: Secondary | ICD-10-CM

## 2014-08-17 MED ORDER — PRAVASTATIN SODIUM 40 MG PO TABS: 40.0000 mg | ORAL_TABLET | Freq: Every evening | ORAL | Status: DC

## 2014-08-17 NOTE — Assessment & Plan Note (Signed)
Patient notified to increase pravastatin up to 40 mg qd.  He agrees to call if he develops side effects.  If this occurs, will reduce to pravastatin 20 mg qd and add Zetia.  Will recheck lipid / liver in 3 months and see me a few days later.

## 2014-08-17 NOTE — Patient Instructions (Signed)
1.  Increase pravastatin to 40 mg once daily.  Take in the evening.  Call [facility] [phone] if you start having side effects on this dose.  We would change back to pravastatin 20 mg and add Zetia if this occurs.   2.  Continue weight loss efforts and limiting sugar in diet. 3.  Recheck cholesterol and liver in 3 months (11/12/14 - fasting labs), and see Ysidro Evert a few days later to review (11/16/14 at 2:30 pm)

## 2014-08-17 NOTE — Progress Notes (Signed)
Patient is a 63 y.o. WM referred to lipid clinic by Dr. Harrington Challenger due to CAD and inability to tolerate Crestor and Lipitor.  I spoke with patient 05/2014 and he was agreeable to start low dose pravastatin 20 mg qd.  He has been tolerating this well for the past 2 months, and LDL down to 108 mg/dL on pravastatin.  His LDL is ~ 185 mg/dL off therapy.  Failed Crestor 10 mg qd and lipitor 40-80 mg qd.  He has a h/o MI and DES in 2008.  Dr. Hilma Favors told patient his tendon pain was due to statin so he was fearful of taking statins in past.  Fortunately tolerating pravastatin 20 mg well.  He did try Zetia in the past he tells me, but was stopped due to lack of potency.  Patient has lost 8 lbs over past year from cutting sugar and calories from his diet.  We originally discussed possible PCSK-9 inhibitors back in 05/2014, but explained to him we will hopefully not need that now.  LDL goal < 70 mg/dL and non-HDL goal < 100 mg/dL given h/o CAD Meds:  Pravastatin 20 mg qd Intolerant:  Lipitor 40-80 mg qd, Crestor 10 mg qd  Diet:  Patient already eats a low fat diet.  Eats cheerios for breakfast in morning, sandwich and fruit at lunch, and eats vegetables mostly for evening meal.  Social history:  Is single.  Doesn't use tobacco products.  Denies alcohol use. Family history:  Mother developed CAD in her 62's, and father had carotid disease in his late 52's.  No other history of heart disease.  Labs:   07/2014:  TC 166, TG 138, HDL 30, LDL 108, LFTs normal (pravastatin 20 mg qd)  Current Outpatient Prescriptions  Medication Sig Dispense Refill  . aspirin 325 MG tablet Take 325 mg by mouth every morning.      . Astaxanthin 4 MG CAPS Take 4 mg by mouth every other day.       . Cholecalciferol (VITAMIN D) 2000 UNITS tablet Take 2,000 Units by mouth daily.      . Coenzyme Q10 200 MG capsule Take 200 mg by mouth daily.      Marland Kitchen losartan-hydrochlorothiazide (HYZAAR) 100-25 MG per tablet Take 0.5 tablets by mouth every  morning.  90 tablet  0  . nitroGLYCERIN (NITROSTAT) 0.4 MG SL tablet Place 1 tablet (0.4 mg total) under the tongue every 5 (five) minutes as needed. For chest pains  25 tablet  3  . omeprazole (PRILOSEC) 20 MG capsule Take 1 capsule (20 mg total) by mouth daily before breakfast.  90 capsule  1  . pravastatin (PRAVACHOL) 20 MG tablet Take 1 tablet (20 mg total) by mouth every evening.  30 tablet  5  . Probiotic Product (PROBIOTIC DAILY PO) Take by mouth daily.       No current facility-administered medications for this visit.   Allergies  Allergen Reactions  . Bee Venom Hives and Swelling  . Clopidogrel Bisulfate Hives    Hives  . Crestor [Rosuvastatin]     Muscle aches  . Lipitor [Atorvastatin]     Muscle aches   . Penicillins     Pt unsure what type allergy/ was a child when had reaction   Family History  Problem Relation Age of Onset  . Dementia Mother   . Prostate cancer Father 25  . Lung cancer Sister 80  . Colon cancer Neg Hx   . Colon polyps Neg Hx   .  Anesthesia problems Neg Hx   . Hypotension Neg Hx   . Malignant hyperthermia Neg Hx   . Pseudochol deficiency Neg Hx

## 2014-08-18 ENCOUNTER — Ambulatory Visit: Payer: Federal, State, Local not specified - PPO | Admitting: Gastroenterology

## 2014-08-19 ENCOUNTER — Ambulatory Visit: Payer: Federal, State, Local not specified - PPO | Admitting: Pharmacist

## 2014-08-20 ENCOUNTER — Ambulatory Visit (HOSPITAL_COMMUNITY)
Admission: RE | Admit: 2014-08-20 | Discharge: 2014-08-20 | Disposition: A | Discharge status: Home / Self Care | Payer: Federal, State, Local not specified - PPO | Source: Ambulatory Visit | Attending: Physical Medicine and Rehabilitation | Admitting: Physical Medicine and Rehabilitation

## 2014-08-20 DIAGNOSIS — IMO0001 Reserved for inherently not codable concepts without codable children: Secondary | ICD-10-CM | POA: Diagnosis not present

## 2014-08-20 DIAGNOSIS — G8929 Other chronic pain: Secondary | ICD-10-CM

## 2014-08-20 DIAGNOSIS — M5412 Radiculopathy, cervical region: Principal | ICD-10-CM

## 2014-08-20 NOTE — Progress Notes (Signed)
Physical Therapy Treatment Patient Details  Name: [name] [name] MRN: [id number] Date of Birth: 05-05-1951  Today's Date: 08/20/2014 Time: 4035-2481 PT Time Calculation (min): 45 min    Charges: manual 1435-1455, TherEx 1455-1520 Visit#: 6 of 8  Assessment Diagnosis: radicular cervical pain  Next MD Visit: 08/20/2014 Prior Therapy: none  Subjective: Symptoms/Limitations Symptoms: Patient asked what he was being seen for upon arrivaal aptuient sted i am sore and hurt all over, but notes his neck is his primar concern no pain noted this session and no radicular symptoms.  Pain Assessment Currently in Pain?: Yes Pain Score: 3  Pain Location: Neck Pain Orientation: Right;Left Pain Onset: More than a month ago  Exercise/Treatments Stretches Chest Stretch: 4 reps;20 seconds Theraband Exercises Rows: 10 reps;Green Standing Exercises Neck Retraction: 20 reps;3 secs Seated Exercises Other Seated Exercise: Thoracic spine excursions arms crossed over chest 10x  Manual Therapy Other Manual Therapy: manual traction, subocipital release, manual retraction  Physical Therapy Assessment and Plan PT Assessment and Plan Clinical Impression Statement: Session initially focused on increasign suboccipital triangle muscles mobility to increase cervical flexion and improve patient's ability to perform cervical retraction to decrease neck pain. follwoign suboccipital relaease and therapist facilitation of cervical retraction patient noted decreased pain and that he would like t no longer perform mechanical traction. Patient performed 3way Pec stretch noting improved stretch compared to wall stretch. no pain noted throughtou session except in Rt  hand over distal ulnar distribution with skind abnormalities and discoloration noticed. Patient stated we will be going to see his doctor to look at it further,  PT Plan: Continue to address and progress posture to increase cervical spine strain.      Goals PT Short Term Goals PT Short Term Goal 1: Pain no greater than a 3/5 PT Short Term Goal 1 - Progress: Progressing toward goal PT Short Term Goal 2: radicular sx to be to mid upper arm only PT Short Term Goal 2 - Progress: Progressing toward goal PT Short Term Goal 3: Pt to be more aware of posture and it's roll in spinal pain PT Short Term Goal 3 - Progress: Progressing toward goal  Problem List Patient Active Problem List   Diagnosis Date Noted  . Chronic radicular cervical pain 07/29/2014  . GERD (gastroesophageal reflux disease) 10/14/2012  . Constipation 10/14/2012  . Stiffness of joint, not elsewhere classified, other specified site 02/21/2012  . PUD (peptic ulcer disease) 12/21/2011  . Dysphagia 07/25/2011  . HYPERLIPIDEMIA-MIXED 08/12/2009  . ESSENTIAL HYPERTENSION, BENIGN 08/12/2009  . CAD, NATIVE VESSEL 08/12/2009  . CUTANEOUS ERUPTIONS, DRUG-INDUCED 01/09/2008  . LOCALIZED SUPERFICIAL SWELLING MASS OR LUMP 01/09/2008    PT - End of Session Activity Tolerance: Patient tolerated treatment well General Behavior During Therapy: [facility] for tasks assessed/performed  GP    Adiah Guereca R 08/20/2014, 5:38 PM

## 2014-08-24 ENCOUNTER — Ambulatory Visit (HOSPITAL_COMMUNITY)
Admission: RE | Admit: 2014-08-24 | Discharge: 2014-08-24 | Disposition: A | Discharge status: Home / Self Care | Payer: Federal, State, Local not specified - PPO | Source: Ambulatory Visit | Attending: Physical Medicine and Rehabilitation | Admitting: Physical Medicine and Rehabilitation

## 2014-08-24 DIAGNOSIS — G8929 Other chronic pain: Secondary | ICD-10-CM

## 2014-08-24 DIAGNOSIS — M5412 Radiculopathy, cervical region: Principal | ICD-10-CM

## 2014-08-24 DIAGNOSIS — IMO0001 Reserved for inherently not codable concepts without codable children: Secondary | ICD-10-CM | POA: Diagnosis not present

## 2014-08-24 NOTE — Evaluation (Signed)
Physical Therapy Discharge  Patient Details  Name: [name] [name] MRN: [id number] Date of Birth: 04-Feb-1951  Today's Date: 08/24/2014 Time: 1430-1520 PT Time Calculation (min): 50 min    Charges: Manual 1430-1445, TherEx 9449-6759          Visit#: 7 of 8  Re-eval: 08/28/14 Assessment Diagnosis: radicular cervical pain  Next MD Visit: 08/20/2014 Prior Therapy: none  Authorization:   [employer]    Past Medical History:  Past Medical History  Diagnosis Date  . CAD (coronary artery disease)   . HTN (hypertension)   . Hyperlipidemia   . Avascular necrosis of bone of hip     right  . Anxiety   . Paresthesias   . GERD (gastroesophageal reflux disease)    Past Surgical History:  Past Surgical History  Procedure Laterality Date  . Tonsillectomy  age 70  . Total hip arthroplasty  age 52    right hip for avascular necrosis of hip and spine  . Colonoscopy  2011 NUR [facility] SCREENING d50 v5    MILD [place] TICS, ? PREP ARTIFACT V. PROCTITIS-rX:[place]  . Cardiac catheterization      3 stents placed  . Esophagogastroduodenoscopy  10/03/11    small hiatal hernia/gastric ulcers/stricutre in distal esophagus    Subjective Symptoms/Limitations Symptoms: Patient notes improving pain and symptoms since working on posture. patient notes continued pain along inverior cervical spine.  Pain Assessment Currently in Pain?: Yes Pain Score: 2   Assessment Cervical AROM Cervical Flexion: wfl reps  Cervical Extension: wfl reps no change  Cervical - Right Side Bend: wnl Cervical - Left Side Bend: wnl Cervical - Right Rotation: wnl  Cervical - Left Rotation: WNL  Exercise/Treatments Mobility/Balance  Posture/Postural Control Postural Limitations: forward head, rounded shoulders, increase kyphosis   Stretches Chest Stretch: Limitations Chest Stretch Limitations: 10x 3seconds 3 way Theraband Exercises Rows: Blue;20 reps Standing Exercises Neck Retraction: 20 reps;3 secs Seated Exercises Other  Seated Exercise: Thoracic spine excursions with overhead reach 10x Other Seated Exercise: Overhead dumbbell matrix common 10x 3lb dumbbell Supine Exercises Neck Retraction: 10 reps;10 secs  Manual therapy: suboccipital release, grade 4 mobilizations throughout thoracic spine to increase extension, Upper trapezius and sternocleidomastoid stretch.   Physical Therapy Assessment and Plan PT Assessment and Plan Clinical Impression Statement: Sessionfocused on conitnued improvement of posture by strengtheing thorascic extension to decrease thoracic kyphosis and cervical deep neck flexor strength to improve forward head postutre, follwoign exercises postient noted no pain and displaysed improved sittign and standing posture Patient was educated in performance of HEP and stated  indpendence with all exercises. Patient states he is unsure of discharge as he is going to see his MD to determine if any further improvements can be made with therapy. patient was educated on importance of therapy and need to perform exercises on a regular basis to maintain and progress improvements made in therapy. patient states he will contineu to perform exercises at hoem to reach long term goals of no pain.  Patient discharged from therapy stating he would like to try to perform HEP for self treatment and increase fphsyical activity by attendig the [facility]. PT Plan: Discharged with HEP    Goals PT Short Term Goals PT Short Term Goal 1: Pain no greater than a 3/5 PT Short Term Goal 1 - Progress: Met PT Short Term Goal 2: radicular sx to be to mid upper arm only PT Short Term Goal 2 - Progress: Met PT Short Term Goal 3: Pt to be more aware  of posture and it's roll in spinal pain PT Short Term Goal 3 - Progress: Met PT Long Term Goals PT Long Term Goal 1: Pt to have no radicular sx  PT Long Term Goal 1 - Progress: Progressing toward goal PT Long Term Goal 2: Pt to be I in advance HEP PT Long Term Goal 2 - Progress:  Progressing toward goal Long Term Goal 3: Pt pain to be no greater than a 1/10 80% of the day.  Long Term Goal 3 Progress: Progressing toward goal  Problem List Patient Active Problem List   Diagnosis Date Noted  . Chronic radicular cervical pain 07/29/2014  . GERD (gastroesophageal reflux disease) 10/14/2012  . Constipation 10/14/2012  . Stiffness of joint, not elsewhere classified, other specified site 02/21/2012  . PUD (peptic ulcer disease) 12/21/2011  . Dysphagia 07/25/2011  . HYPERLIPIDEMIA-MIXED 08/12/2009  . ESSENTIAL HYPERTENSION, BENIGN 08/12/2009  . CAD, NATIVE VESSEL 08/12/2009  . CUTANEOUS ERUPTIONS, DRUG-INDUCED 01/09/2008  . LOCALIZED SUPERFICIAL SWELLING MASS OR LUMP 01/09/2008    PT - End of Session Activity Tolerance: Patient tolerated treatment well General Behavior During Therapy: [facility] for tasks assessed/performed  GP    Azekiel Cremer R 08/24/2014, 3:36 PM  Physician Documentation Your signature is required to indicate approval of the treatment plan as stated above.  Please sign and either send electronically or make a copy of this report for your files and return this physician signed original.   Please mark one 1.__approve of plan  2. ___approve of plan with the following conditions.   ______________________________                                                          _____________________ Physician Signature                                                                                                             Date

## 2014-08-25 ENCOUNTER — Other Ambulatory Visit: Payer: Self-pay | Admitting: Internal Medicine

## 2014-08-25 ENCOUNTER — Telehealth: Payer: Self-pay | Admitting: Internal Medicine

## 2014-08-25 NOTE — Telephone Encounter (Signed)
New problem   Pt stated he is returning a call from nurse.

## 2014-08-25 NOTE — Telephone Encounter (Signed)
Returned call.  Left message to call back or I can try him back later today.

## 2014-09-02 ENCOUNTER — Encounter (HOSPITAL_COMMUNITY): Payer: Self-pay | Admitting: Pharmacy Technician

## 2014-09-02 ENCOUNTER — Encounter: Payer: Self-pay | Admitting: Gastroenterology

## 2014-09-02 ENCOUNTER — Ambulatory Visit (INDEPENDENT_AMBULATORY_CARE_PROVIDER_SITE_OTHER): Payer: Federal, State, Local not specified - PPO | Admitting: Gastroenterology

## 2014-09-02 ENCOUNTER — Other Ambulatory Visit: Payer: Self-pay | Admitting: Gastroenterology

## 2014-09-02 VITALS — BP 120/79 | HR 84 | Temp 97.6°F | Ht 72.0 in | Wt 179.4 lb

## 2014-09-02 DIAGNOSIS — R1310 Dysphagia, unspecified: Secondary | ICD-10-CM

## 2014-09-02 DIAGNOSIS — R1319 Other dysphagia: Secondary | ICD-10-CM

## 2014-09-02 NOTE — Assessment & Plan Note (Signed)
LIKELY DUE TO STRICTURE.  EGD/DIL SEP 11. DISCUSSED PROCEDURE, BENEFITS, & RISKS: < 1% chance of medication reaction, bleeding, OR perforation. CONTINUE OMEPRAZOLE.  TAKE 30 MINUTES PRIOR TO YOUR FIST MEAL. LOW FAT DIET FOLLOW UP IN 3 MOS.

## 2014-09-02 NOTE — Progress Notes (Signed)
Subjective:    Patient ID: [name] [name], male    DOB: Nov 22, 1951, 63 y.o.   MRN: [id number]  Purvis Kilts, MD  HPI Having trouble swallowing/CHEST PAIN RADIATING TO HIS BACK AFTER TAKING solids and pills. Happens Today feels good. Happens 3-4x/week. HEARTBURN: RARE. JUST RECTAL PRESSURE BURNING PAIN-OCCASIONALLY. NO RECTAL ITCHING OR BLEEDING. BMS: DAILY #4 USUALLY. HAVING PROBLEM WITH BURNING IN ARMS/LEG MUSCLE.   PT DENIES FEVER, CHILLS, HEMATOCHEZIA, HEMATEMESIS, nausea, vomiting, melena, diarrhea, SHORTNESS OF BREATH,  CHANGE IN BOWEL IN HABITS, abdominal pain,  OR problems with sedation,.  Past Medical History  Diagnosis Date  . CAD (coronary artery disease)   . HTN (hypertension)   . Hyperlipidemia   . Avascular necrosis of bone of hip     right  . Anxiety   . Paresthesias   . GERD (gastroesophageal reflux disease)    Past Surgical History  Procedure Laterality Date  . Tonsillectomy  age 4  . Total hip arthroplasty  age 32    right hip for avascular necrosis of hip and spine  . Colonoscopy  2011 NUR [facility] SCREENING d50 v5    MILD [place] TICS, ? PREP ARTIFACT V. PROCTITIS-rX:[place]  . Cardiac catheterization      3 stents placed  . Esophagogastroduodenoscopy  10/03/11    small hiatal hernia/gastric ulcers/stricutre in distal esophagus   Allergies  Allergen Reactions  . Bee Venom Hives and Swelling  . Clopidogrel Bisulfate Hives    Hives  . Crestor [Rosuvastatin]     Muscle aches  . Lipitor [Atorvastatin]     Muscle aches   . Penicillins     Pt unsure what type allergy/ was a child when had reaction   Current Outpatient Prescriptions  Medication Sig Dispense Refill  . aspirin 325 MG tablet Take 325 mg by mouth every morning.    . Astaxanthin 4 MG CAPS Take 4 mg by mouth every other day.     . Coenzyme Q10 200 MG capsule Take 200 mg by mouth daily.    Marland Kitchen losartan-hydrochlorothiazide (HYZAAR) 100-25 MG per tablet TAKE ONE-HALF TABLET BY MOUTH ONCE DAILY IN  THE MORNING    . Magnesium 500 MG CAPS Take 1,000 mg by mouth.    . nitroGLYCERIN (NITROSTAT) 0.4 MG SL tablet Place 1 tablet (0.4 mg total) under the tongue every 5 (five) minutes as needed. For chest pains    . omeprazole (PRILOSEC) 20 MG capsule Take 1 capsule (20 mg total) by mouth daily before breakfast.    . pravastatin (PRAVACHOL) 40 MG tablet Take 1 tablet (40 mg total) by mouth every evening.    . Probiotic Product (PROBIOTIC DAILY PO) Take by mouth daily.      . Cholecalciferol (VITAMIN D) 2000 UNITS tablet Take 2,000 Units by mouth daily.          Review of Systems     Objective:   Physical Exam  Vitals reviewed. Constitutional: He is oriented to person, place, and time. He appears well-developed and well-nourished. No distress.  HENT:  Head: Normocephalic and atraumatic.  Mouth/Throat: Oropharynx is clear and moist. No oropharyngeal exudate.  Eyes: Pupils are equal, round, and reactive to light. No scleral icterus.  Neck: Normal range of motion. Neck supple.  Cardiovascular: Normal rate, regular rhythm and normal heart sounds.   Pulmonary/Chest: Effort normal and breath sounds normal. No respiratory distress.  Abdominal: Soft. Bowel sounds are normal. He exhibits no distension. There is no tenderness.  Musculoskeletal: He exhibits  no edema.  Lymphadenopathy:    He has no cervical adenopathy.  Neurological: He is alert and oriented to person, place, and time.  NO FOCAL DEFICITS   Psychiatric: He has a normal mood and affect.          Assessment & Plan:

## 2014-09-02 NOTE — Progress Notes (Signed)
PATIENT NIC'D  °

## 2014-09-02 NOTE — Patient Instructions (Signed)
ENDOSCOPY TO STRETCH YOUR ESOPHAGUS ON SEP 11.   CONTINUE OMEPRAZOLE.  TAKE 30 MINUTES PRIOR TO YOUR FIRST MEAL.  FOLLOW A LOW FAT DIET. SEE INFO BELOW.  FOLLOW UP IN 3 MOS.  Low-Fat Diet BREADS, CEREALS, PASTA, RICE, DRIED PEAS, AND BEANS These products are high in carbohydrates and most are low in fat. Therefore, they can be increased in the diet as substitutes for fatty foods. They too, however, contain calories and should not be eaten in excess. Cereals can be eaten for snacks as well as for breakfast.   FRUITS AND VEGETABLES It is good to eat fruits and vegetables. Besides being sources of fiber, both are rich in vitamins and some minerals. They help you get the daily allowances of these nutrients. Fruits and vegetables can be used for snacks and desserts.  MEATS Limit lean meat, chicken, [place], and fish to no more than 6 ounces per day. Beef, Pork, and Lamb Use lean cuts of beef, pork, and lamb. Lean cuts include:  Extra-lean ground beef.  Arm roast.  Sirloin tip.  Center-cut ham.  Round steak.  Loin chops.  Rump roast.  Tenderloin.  Trim all fat off the outside of meats before cooking. It is not necessary to severely decrease the intake of red meat, but lean choices should be made. Lean meat is rich in protein and contains a highly absorbable form of iron. Premenopausal women, in particular, should avoid reducing lean red meat because this could increase the risk for low red blood cells (iron-deficiency anemia).  Chicken and [place] These are good sources of protein. The fat of poultry can be reduced by removing the skin and underlying fat layers before cooking. Chicken and [place] can be substituted for lean red meat in the diet. Poultry should not be fried or covered with high-fat sauces. Fish and Shellfish Fish is a good source of protein. Shellfish contain cholesterol, but they usually are low in saturated fatty acids. The preparation of fish is important. Like chicken and  [place], they should not be fried or covered with high-fat sauces. EGGS Egg whites contain no fat or cholesterol. They can be eaten often. Try 1 to 2 egg whites instead of whole eggs in recipes or use egg substitutes that do not contain yolk. MILK AND DAIRY PRODUCTS Use skim or 1% milk instead of 2% or whole milk. Decrease whole milk, natural, and processed cheeses. Use nonfat or low-fat (2%) cottage cheese or low-fat cheeses made from vegetable oils. Choose nonfat or low-fat (1 to 2%) yogurt. Experiment with evaporated skim milk in recipes that call for heavy cream. Substitute low-fat yogurt or low-fat cottage cheese for sour cream in dips and salad dressings. Have at least 2 servings of low-fat dairy products, such as 2 glasses of skim (or 1%) milk each day to help get your daily calcium intake. FATS AND OILS Reduce the total intake of fats, especially saturated fat. Butterfat, lard, and beef fats are high in saturated fat and cholesterol. These should be avoided as much as possible. Vegetable fats do not contain cholesterol, but certain vegetable fats, such as coconut oil, palm oil, and palm kernel oil are very high in saturated fats. These should be limited. These fats are often used in bakery goods, processed foods, popcorn, oils, and nondairy creamers. Vegetable shortenings and some peanut butters contain hydrogenated oils, which are also saturated fats. Read the labels on these foods and check for saturated vegetable oils. Unsaturated vegetable oils and fats do not raise blood  cholesterol. However, they should be limited because they are fats and are high in calories. Total fat should still be limited to 30% of your daily caloric intake. Desirable liquid vegetable oils are corn oil, cottonseed oil, olive oil, canola oil, safflower oil, soybean oil, and sunflower oil. Peanut oil is not as good, but small amounts are acceptable. Buy a heart-healthy tub margarine that has no partially hydrogenated oils in  the ingredients. Mayonnaise and salad dressings often are made from unsaturated fats, but they should also be limited because of their high calorie and fat content. Seeds, nuts, peanut butter, olives, and avocados are high in fat, but the fat is mainly the unsaturated type. These foods should be limited mainly to avoid excess calories and fat. OTHER EATING TIPS Snacks  Most sweets should be limited as snacks. They tend to be rich in calories and fats, and their caloric content outweighs their nutritional value. Some good choices in snacks are graham crackers, melba toast, soda crackers, bagels (no egg), English muffins, fruits, and vegetables. These snacks are preferable to snack crackers, [place] fries, TORTILLA CHIPS, and POTATO chips. Popcorn should be air-popped or cooked in small amounts of liquid vegetable oil. Desserts Eat fruit, low-fat yogurt, and fruit ices instead of pastries, cake, and cookies. Sherbet, angel food cake, gelatin dessert, frozen low-fat yogurt, or other frozen products that do not contain saturated fat (pure fruit juice bars, frozen ice pops) are also acceptable.  COOKING METHODS Choose those methods that use little or no fat. They include: Poaching.  Braising.  Steaming.  Grilling.  Baking.  Stir-frying.  Broiling.  Microwaving.  Foods can be cooked in a nonstick pan without added fat, or use a nonfat cooking spray in regular cookware. Limit fried foods and avoid frying in saturated fat. Add moisture to lean meats by using water, broth, cooking wines, and other nonfat or low-fat sauces along with the cooking methods mentioned above. Soups and stews should be chilled after cooking. The fat that forms on top after a few hours in the refrigerator should be skimmed off. When preparing meals, avoid using excess salt. Salt can contribute to raising blood pressure in some people.  EATING AWAY FROM HOME Order entres, potatoes, and vegetables without sauces or butter. When  meat exceeds the size of a deck of cards (3 to 4 ounces), the rest can be taken home for another meal. Choose vegetable or fruit salads and ask for low-calorie salad dressings to be served on the side. Use dressings sparingly. Limit high-fat toppings, such as bacon, crumbled eggs, cheese, sunflower seeds, and olives. Ask for heart-healthy tub margarine instead of butter.

## 2014-09-02 NOTE — Progress Notes (Signed)
Cc to pcp °

## 2014-09-02 NOTE — Telephone Encounter (Signed)
Discussed lab results with patient. 

## 2014-09-07 ENCOUNTER — Telehealth: Payer: Self-pay | Admitting: Gastroenterology

## 2014-09-07 NOTE — Telephone Encounter (Signed)
Patient wants to cx procedure at this time has other things that have came up that is more pressing at this time, he will call back at a later date to R/S.

## 2014-09-10 ENCOUNTER — Ambulatory Visit (HOSPITAL_COMMUNITY)
Admission: RE | Admit: 2014-09-10 | Payer: Federal, State, Local not specified - PPO | Source: Ambulatory Visit | Admitting: Gastroenterology

## 2014-09-10 ENCOUNTER — Encounter (HOSPITAL_COMMUNITY): Admission: RE | Payer: Self-pay | Source: Ambulatory Visit

## 2014-09-10 SURGERY — EGD (ESOPHAGOGASTRODUODENOSCOPY)
Anesthesia: Moderate Sedation

## 2014-09-29 ENCOUNTER — Encounter (HOSPITAL_BASED_OUTPATIENT_CLINIC_OR_DEPARTMENT_OTHER): Payer: Self-pay | Admitting: *Deleted

## 2014-09-29 ENCOUNTER — Other Ambulatory Visit: Payer: Self-pay | Admitting: Orthopedic Surgery

## 2014-09-30 ENCOUNTER — Encounter (HOSPITAL_BASED_OUTPATIENT_CLINIC_OR_DEPARTMENT_OTHER): Payer: Self-pay | Admitting: Anesthesiology

## 2014-09-30 ENCOUNTER — Ambulatory Visit (HOSPITAL_BASED_OUTPATIENT_CLINIC_OR_DEPARTMENT_OTHER)
Admission: RE | Admit: 2014-09-30 | Discharge: 2014-09-30 | Disposition: A | Discharge status: Home / Self Care | Payer: Federal, State, Local not specified - PPO | Source: Ambulatory Visit | Attending: Orthopedic Surgery | Admitting: Orthopedic Surgery

## 2014-09-30 ENCOUNTER — Encounter (HOSPITAL_BASED_OUTPATIENT_CLINIC_OR_DEPARTMENT_OTHER)
Admission: RE | Disposition: A | Discharge status: Home / Self Care | Payer: Self-pay | Source: Ambulatory Visit | Attending: Orthopedic Surgery

## 2014-09-30 ENCOUNTER — Ambulatory Visit (HOSPITAL_BASED_OUTPATIENT_CLINIC_OR_DEPARTMENT_OTHER): Payer: Federal, State, Local not specified - PPO | Admitting: Anesthesiology

## 2014-09-30 ENCOUNTER — Encounter (HOSPITAL_BASED_OUTPATIENT_CLINIC_OR_DEPARTMENT_OTHER): Payer: Federal, State, Local not specified - PPO | Admitting: Anesthesiology

## 2014-09-30 DIAGNOSIS — I2510 Atherosclerotic heart disease of native coronary artery without angina pectoris: Secondary | ICD-10-CM | POA: Insufficient documentation

## 2014-09-30 DIAGNOSIS — I252 Old myocardial infarction: Secondary | ICD-10-CM | POA: Diagnosis not present

## 2014-09-30 DIAGNOSIS — R209 Unspecified disturbances of skin sensation: Secondary | ICD-10-CM | POA: Diagnosis not present

## 2014-09-30 DIAGNOSIS — Z91030 Bee allergy status: Secondary | ICD-10-CM | POA: Diagnosis not present

## 2014-09-30 DIAGNOSIS — I10 Essential (primary) hypertension: Secondary | ICD-10-CM | POA: Diagnosis not present

## 2014-09-30 DIAGNOSIS — R2232 Localized swelling, mass and lump, left upper limb: Secondary | ICD-10-CM | POA: Diagnosis not present

## 2014-09-30 DIAGNOSIS — Z880 Allergy status to penicillin: Secondary | ICD-10-CM | POA: Diagnosis not present

## 2014-09-30 DIAGNOSIS — Z888 Allergy status to other drugs, medicaments and biological substances status: Secondary | ICD-10-CM | POA: Insufficient documentation

## 2014-09-30 DIAGNOSIS — M1388 Other specified arthritis, other site: Secondary | ICD-10-CM | POA: Insufficient documentation

## 2014-09-30 DIAGNOSIS — F419 Anxiety disorder, unspecified: Secondary | ICD-10-CM | POA: Diagnosis not present

## 2014-09-30 DIAGNOSIS — K219 Gastro-esophageal reflux disease without esophagitis: Secondary | ICD-10-CM | POA: Insufficient documentation

## 2014-09-30 DIAGNOSIS — E785 Hyperlipidemia, unspecified: Secondary | ICD-10-CM | POA: Diagnosis not present

## 2014-09-30 DIAGNOSIS — M87850 Other osteonecrosis, pelvis: Secondary | ICD-10-CM | POA: Diagnosis not present

## 2014-09-30 HISTORY — DX: Acute myocardial infarction, unspecified: I21.9

## 2014-09-30 HISTORY — DX: Unspecified osteoarthritis, unspecified site: M19.90

## 2014-09-30 HISTORY — PX: MASS EXCISION: SHX2000

## 2014-09-30 LAB — POCT I-STAT, CHEM 8
BUN: 13 mg/dL (ref 6–23)
CALCIUM ION: 1.19 mmol/L (ref 1.13–1.30)
CHLORIDE: 101 meq/L (ref 96–112)
Creatinine, Ser: 1 mg/dL (ref 0.50–1.35)
Glucose, Bld: 111 mg/dL — ABNORMAL HIGH (ref 70–99)
HCT: 47 % (ref 39.0–52.0)
Hemoglobin: 16 g/dL (ref 13.0–17.0)
Potassium: 4 mEq/L (ref 3.7–5.3)
Sodium: 140 mEq/L (ref 137–147)
TCO2: 26 mmol/L (ref 0–100)

## 2014-09-30 SURGERY — EXCISION MASS
Anesthesia: Monitor Anesthesia Care | Site: Wrist | Laterality: Left

## 2014-09-30 MED ORDER — MIDAZOLAM HCL 5 MG/5ML IJ SOLN
INTRAMUSCULAR | Status: DC | PRN
  Administered 2014-09-30: 2 mg via INTRAVENOUS

## 2014-09-30 MED ORDER — HYDROCODONE-ACETAMINOPHEN 5-325 MG PO TABS: 1.0000 | ORAL_TABLET | Freq: Four times a day (QID) | ORAL | Status: DC | PRN

## 2014-09-30 MED ORDER — MIDAZOLAM HCL 2 MG/2ML IJ SOLN
INTRAMUSCULAR | Status: AC
  Filled 2014-09-30: qty 2

## 2014-09-30 MED ORDER — PROPOFOL INFUSION 10 MG/ML OPTIME
INTRAVENOUS | Status: DC | PRN
  Administered 2014-09-30: 50 ug/kg/min via INTRAVENOUS

## 2014-09-30 MED ORDER — FENTANYL CITRATE 0.05 MG/ML IJ SOLN
INTRAMUSCULAR | Status: AC
  Filled 2014-09-30: qty 6

## 2014-09-30 MED ORDER — PROPOFOL 10 MG/ML IV EMUL
INTRAVENOUS | Status: AC
  Filled 2014-09-30: qty 100

## 2014-09-30 MED ORDER — CHLORHEXIDINE GLUCONATE 4 % EX LIQD: 60.0000 mL | Freq: Once | CUTANEOUS | Status: DC

## 2014-09-30 MED ORDER — LACTATED RINGERS IV SOLN
INTRAVENOUS | Status: DC
  Administered 2014-09-30: 08:00:00 via INTRAVENOUS

## 2014-09-30 MED ORDER — ONDANSETRON HCL 4 MG/2ML IJ SOLN
INTRAMUSCULAR | Status: DC | PRN
  Administered 2014-09-30: 4 mg via INTRAVENOUS

## 2014-09-30 MED ORDER — FENTANYL CITRATE 0.05 MG/ML IJ SOLN: 25.0000 ug | INTRAMUSCULAR | Status: DC | PRN

## 2014-09-30 MED ORDER — ONDANSETRON HCL 4 MG/2ML IJ SOLN
4.0000 mg | Freq: Once | INTRAMUSCULAR | Status: DC | PRN
Start: 2014-09-30 — End: 2014-09-30

## 2014-09-30 MED ORDER — LIDOCAINE HCL (PF) 1 % IJ SOLN
INTRAMUSCULAR | Status: AC
  Filled 2014-09-30: qty 150

## 2014-09-30 MED ORDER — BUPIVACAINE HCL (PF) 0.25 % IJ SOLN
INTRAMUSCULAR | Status: DC | PRN
  Administered 2014-09-30: 4 mL

## 2014-09-30 MED ORDER — FENTANYL CITRATE 0.05 MG/ML IJ SOLN: 50.0000 ug | INTRAMUSCULAR | Status: DC | PRN

## 2014-09-30 MED ORDER — VANCOMYCIN HCL IN DEXTROSE 1-5 GM/200ML-% IV SOLN
1000.0000 mg | INTRAVENOUS | Status: DC
Start: 2014-09-30 — End: 2014-09-30

## 2014-09-30 MED ORDER — FENTANYL CITRATE 0.05 MG/ML IJ SOLN
INTRAMUSCULAR | Status: DC | PRN
  Administered 2014-09-30: 25 ug via INTRAVENOUS

## 2014-09-30 MED ORDER — BUPIVACAINE HCL (PF) 0.25 % IJ SOLN
INTRAMUSCULAR | Status: AC
  Filled 2014-09-30: qty 150

## 2014-09-30 MED ORDER — MIDAZOLAM HCL 2 MG/ML PO SYRP: 0.5000 mg/kg | ORAL_SOLUTION | Freq: Once | ORAL | Status: DC | PRN

## 2014-09-30 MED ORDER — MIDAZOLAM HCL 2 MG/2ML IJ SOLN: 1.0000 mg | INTRAMUSCULAR | Status: DC | PRN

## 2014-09-30 MED ORDER — PROPOFOL 10 MG/ML IV BOLUS
INTRAVENOUS | Status: AC
  Filled 2014-09-30: qty 120

## 2014-09-30 SURGICAL SUPPLY — 51 items
BANDAGE COBAN STERILE 2 (GAUZE/BANDAGES/DRESSINGS) IMPLANT
BLADE MINI RND TIP GREEN BEAV (BLADE) IMPLANT
BLADE SURG 15 STRL LF DISP TIS (BLADE) ×1 IMPLANT
BLADE SURG 15 STRL SS (BLADE) ×3
BNDG CMPR 9X4 STRL LF SNTH (GAUZE/BANDAGES/DRESSINGS)
BNDG COHESIVE 1X5 TAN STRL LF (GAUZE/BANDAGES/DRESSINGS) IMPLANT
BNDG COHESIVE 3X5 TAN STRL LF (GAUZE/BANDAGES/DRESSINGS) ×2 IMPLANT
BNDG ESMARK 4X9 LF (GAUZE/BANDAGES/DRESSINGS) IMPLANT
BNDG GAUZE ELAST 4 BULKY (GAUZE/BANDAGES/DRESSINGS) IMPLANT
CHLORAPREP W/TINT 26ML (MISCELLANEOUS) ×3 IMPLANT
CORDS BIPOLAR (ELECTRODE) ×3 IMPLANT
COVER MAYO STAND STRL (DRAPES) ×3 IMPLANT
COVER TABLE BACK 60X90 (DRAPES) ×3 IMPLANT
CUFF TOURNIQUET SINGLE 18IN (TOURNIQUET CUFF) ×2 IMPLANT
DECANTER SPIKE VIAL GLASS SM (MISCELLANEOUS) IMPLANT
DRAIN PENROSE 1/2X12 LTX STRL (WOUND CARE) IMPLANT
DRAPE EXTREMITY TIBURON (DRAPES) ×3 IMPLANT
DRAPE SURG 17X23 STRL (DRAPES) ×3 IMPLANT
GAUZE SPONGE 4X4 12PLY STRL (GAUZE/BANDAGES/DRESSINGS) ×3 IMPLANT
GAUZE XEROFORM 1X8 LF (GAUZE/BANDAGES/DRESSINGS) ×3 IMPLANT
GLOVE BIO SURGEON STRL SZ 6.5 (GLOVE) ×1 IMPLANT
GLOVE BIO SURGEONS STRL SZ 6.5 (GLOVE) ×1
GLOVE BIOGEL PI IND STRL 7.0 (GLOVE) IMPLANT
GLOVE BIOGEL PI IND STRL 8.5 (GLOVE) ×1 IMPLANT
GLOVE BIOGEL PI INDICATOR 7.0 (GLOVE) ×2
GLOVE BIOGEL PI INDICATOR 8.5 (GLOVE) ×2
GLOVE SURG ORTHO 8.0 STRL STRW (GLOVE) ×3 IMPLANT
GOWN STRL REUS W/ TWL LRG LVL3 (GOWN DISPOSABLE) ×1 IMPLANT
GOWN STRL REUS W/TWL LRG LVL3 (GOWN DISPOSABLE) ×3
GOWN STRL REUS W/TWL XL LVL3 (GOWN DISPOSABLE) ×3 IMPLANT
NDL SAFETY ECLIPSE 18X1.5 (NEEDLE) IMPLANT
NEEDLE 27GAX1X1/2 (NEEDLE) IMPLANT
NEEDLE HYPO 18GX1.5 SHARP (NEEDLE)
NS IRRIG 1000ML POUR BTL (IV SOLUTION) ×3 IMPLANT
PACK BASIN DAY SURGERY FS (CUSTOM PROCEDURE TRAY) ×3 IMPLANT
PAD CAST 3X4 CTTN HI CHSV (CAST SUPPLIES) IMPLANT
PADDING CAST ABS 3INX4YD NS (CAST SUPPLIES)
PADDING CAST ABS 4INX4YD NS (CAST SUPPLIES) ×2
PADDING CAST ABS COTTON 3X4 (CAST SUPPLIES) IMPLANT
PADDING CAST ABS COTTON 4X4 ST (CAST SUPPLIES) ×1 IMPLANT
PADDING CAST COTTON 3X4 STRL (CAST SUPPLIES) ×3
SPLINT PLASTER CAST XFAST 3X15 (CAST SUPPLIES) IMPLANT
SPLINT PLASTER XTRA FASTSET 3X (CAST SUPPLIES)
STOCKINETTE 4X48 STRL (DRAPES) ×3 IMPLANT
SUT VIC AB 4-0 P2 18 (SUTURE) IMPLANT
SUT VICRYL RAPID 5 0 P 3 (SUTURE) IMPLANT
SUT VICRYL RAPIDE 4/0 PS 2 (SUTURE) ×3 IMPLANT
SYR BULB 3OZ (MISCELLANEOUS) ×3 IMPLANT
SYR CONTROL 10ML LL (SYRINGE) ×2 IMPLANT
TOWEL OR 17X24 6PK STRL BLUE (TOWEL DISPOSABLE) ×3 IMPLANT
UNDERPAD 30X30 INCONTINENT (UNDERPADS AND DIAPERS) ×3 IMPLANT

## 2014-09-30 NOTE — Transfer of Care (Signed)
Immediate Anesthesia Transfer of Care Note  Patient: [name] [name]  Procedure(s) Performed: Procedure(s): EXCISION MASS LEFT WRIST  (Left)  Patient Location: PACU  Anesthesia Type:Bier block  Level of Consciousness: awake, alert , oriented and patient cooperative  Airway & Oxygen Therapy: Patient Spontanous Breathing and Patient connected to face mask oxygen  Post-op Assessment: Report given to PACU RN and Post -op Vital signs reviewed and stable  Post vital signs: Reviewed and stable  Complications: No apparent anesthesia complications

## 2014-09-30 NOTE — H&P (Signed)
[name] [name] is a 62 year old right hand dominant former patient who comes in complaining of a mass on the volar ulnar aspect of his left wrist. He felt this may be due to foreign body which he incurred on 11-14-13. This was explored in [place] without success. He was subsequently sent to Dr. Luna Glasgow who has referred him. He now has a mass just distal to the laceration. He feels this was a wood splinter. He has no prior history of injury. No history of diabetes, thyroid problems, or gout. He does have a history of arthritis. He complains of intermittent moderate burning pain. He states it is gradually getting worse. The burning pain is intermittent. He is not complaining of any numbness or tingling or weakness.   PAST MEDICAL HISTORY: He is allergic to PCN and Plavix. He is on Hyzaar, aspirin, Pravachol, omeprazole. He has had a right hip replacement, heart surgery in 2008.  FAMILY H ISTORY: Positive for heart disease, high BP and arthritis.  SOCIAL HISTORY: He does not smoke or drink. He is single and retired.   REVIEW OF SYSTEMS: Positive for high BP, heart attack and lumps, otherwise negative for 14 points. [name] [name] is an 63 y.o. male.   Chief Complaint: mass left wrist HPI: see above  Past Medical History  Diagnosis Date  . HTN (hypertension)   . Hyperlipidemia   . Avascular necrosis of bone of hip     right  . Anxiety   . Paresthesias   . GERD (gastroesophageal reflux disease)   . Myocardial infarction   . CAD (coronary artery disease)     stents x3 2008  . Arthritis     back pain    Past Surgical History  Procedure Laterality Date  . Tonsillectomy  age 70  . Total hip arthroplasty  age 81    right hip for avascular necrosis of hip and spine  . Colonoscopy  2011 NUR [facility] SCREENING d50 v5    MILD [place] TICS, ? PREP ARTIFACT V. PROCTITIS-rX:[place]  . Cardiac catheterization      3 stents placed  . Esophagogastroduodenoscopy  10/03/11    small hiatal  hernia/gastric ulcers/stricutre in distal esophagus    Family History  Problem Relation Age of Onset  . Dementia Mother   . Prostate cancer Father 72  . Lung cancer Sister 62  . Colon cancer Neg Hx   . Colon polyps Neg Hx   . Anesthesia problems Neg Hx   . Hypotension Neg Hx   . Malignant hyperthermia Neg Hx   . Pseudochol deficiency Neg Hx    Social History:  reports that he quit smoking about 7 years ago. His smoking use included Cigarettes. He has a 30 pack-year smoking history. He has never used smokeless tobacco. He reports that he does not drink alcohol or use illicit drugs.  Allergies:  Allergies  Allergen Reactions  . Bee Venom Hives and Swelling  . Clopidogrel Bisulfate Hives    Hives  . Crestor [Rosuvastatin]     Muscle aches  . Lipitor [Atorvastatin]     Muscle aches   . Penicillins     Pt unsure what type allergy/ was a child when had reaction    Medications Prior to Admission  Medication Sig Dispense Refill  . aspirin 325 MG tablet Take 325 mg by mouth every morning.      . Astaxanthin 4 MG CAPS Take 4 mg by mouth every other day.       Marland Kitchen  Coenzyme Q10 200 MG capsule Take 200 mg by mouth daily.      Marland Kitchen losartan-hydrochlorothiazide (HYZAAR) 100-25 MG per tablet TAKE ONE-HALF TABLET BY MOUTH ONCE DAILY IN THE MORNING  90 tablet  0  . Magnesium 500 MG CAPS Take 1,000 mg by mouth.      Marland Kitchen omeprazole (PRILOSEC) 20 MG capsule Take 1 capsule (20 mg total) by mouth daily before breakfast.  90 capsule  1  . pravastatin (PRAVACHOL) 40 MG tablet Take 1 tablet (40 mg total) by mouth every evening.  90 tablet  3  . Probiotic Product (PROBIOTIC DAILY PO) Take by mouth daily.      . nitroGLYCERIN (NITROSTAT) 0.4 MG SL tablet Place 1 tablet (0.4 mg total) under the tongue every 5 (five) minutes as needed. For chest pains  25 tablet  3    No results found for this or any previous visit (from the past 48 hour(s)).  No results found.   Pertinent items are noted in  HPI.  There were no vitals taken for this visit.  General appearance: alert, cooperative and appears stated age Head: Normocephalic, without obvious abnormality Neck: no JVD Resp: clear to auscultation bilaterally Cardio: regular rate and rhythm, S1, S2 normal, no murmur, click, rub or gallop GI: soft, non-tender; bowel sounds normal; no masses,  no organomegaly Extremities: mass left wrist Pulses: 2+ and symmetric Skin: Skin color, texture, turgor normal. No rashes or lesions Neurologic: Grossly normal Incision/Wound: na  Assessment/Plan X-rays of his hand and wrist reveals no foreign material.   Diagnosis: (1) Mass left wrist. (2) Questionable foreign body granuloma, epidermal inclusion cyst.  This does not appear to be a vascular or nerve tumor. This may be a giant cell tumor. We have discussed the possibility of excision of this with him. He has elected to proceed to have this done. The pre, peri and post op course are discussed along with risks and complications.  He is aware there is no guarantee with surgery, possibility of infection, recurrence, injury to arteries, nerves and tendons, incomplete relief of symptoms and dystrophy.  He is scheduled for excisional biopsy mass left wrist as an outpatient under regional anesthesia. Questions were invited and answered to the patient's satisfaction.   [name] [name] R 09/30/2014, 7:35 AM

## 2014-09-30 NOTE — Anesthesia Procedure Notes (Addendum)
Procedure Name: MAC Date/Time: 09/30/2014 8:38 AM Performed by: Marrianne Mood Pre-anesthesia Checklist: Patient identified, Timeout performed, Emergency Drugs available, Suction available and Patient being monitored Oxygen Delivery Method: Simple face mask   Anesthesia Regional Block:  Bier block (IV Regional)  Pre-Anesthetic Checklist: ,, timeout performed, Correct Patient, Correct Site, Correct Laterality, Correct Procedure, Correct Position, site marked, Risks and benefits discussed,  Surgical consent,  Pre-op evaluation,  At surgeon's request and post-op pain management Bier block (IV Regional) Narrative:   Additional Notes: 0.5% Lidocaine 50 ml  block in full left arm with problem or complaint.  Jacklynn Barnacle CRNA

## 2014-09-30 NOTE — Anesthesia Preprocedure Evaluation (Signed)
Anesthesia Evaluation  Patient identified by MRN, date of birth, ID band Patient awake    Reviewed: Allergy & Precautions, H&P , NPO status , Patient's Chart, lab work & pertinent test results  Airway Mallampati: I TM Distance: >3 FB Neck ROM: Full    Dental  (+) Teeth Intact, Dental Advisory Given   Pulmonary former smoker,  breath sounds clear to auscultation        Cardiovascular hypertension, Pt. on medications + CAD and + Past MI Rhythm:Regular Rate:Normal     Neuro/Psych    GI/Hepatic GERD-  Medicated and Controlled,  Endo/Other    Renal/GU      Musculoskeletal   Abdominal   Peds  Hematology   Anesthesia Other Findings   Reproductive/Obstetrics                           Anesthesia Physical Anesthesia Plan  ASA: III  Anesthesia Plan: MAC and Bier Block   Post-op Pain Management:    Induction: Intravenous  Airway Management Planned: Simple Face Mask  Additional Equipment:   Intra-op Plan:   Post-operative Plan:   Informed Consent: I have reviewed the patients History and Physical, chart, labs and discussed the procedure including the risks, benefits and alternatives for the proposed anesthesia with the patient or authorized representative who has indicated his/her understanding and acceptance.   Dental advisory given  Plan Discussed with: CRNA, Anesthesiologist and Surgeon  Anesthesia Plan Comments:         Anesthesia Quick Evaluation

## 2014-09-30 NOTE — Op Note (Signed)
Dictation Number [id number]

## 2014-09-30 NOTE — Discharge Instructions (Addendum)

## 2014-09-30 NOTE — Anesthesia Postprocedure Evaluation (Signed)
  Anesthesia Post-op Note  Patient: [name] [name]  Procedure(s) Performed: Procedure(s): EXCISION MASS LEFT WRIST  (Left)  Patient Location: PACU  Anesthesia Type: MAC, Bier Block   Level of Consciousness: awake, alert  and oriented  Airway and Oxygen Therapy: Patient Spontanous Breathing  Post-op Pain: mild  Post-op Assessment: Post-op Vital signs reviewed  Post-op Vital Signs: Reviewed  Last Vitals:  Filed Vitals:   09/30/14 0958  BP: 144/98  Pulse: 60  Temp: 36.5 C  Resp: 18    Complications: No apparent anesthesia complications

## 2014-09-30 NOTE — Brief Op Note (Signed)
09/30/2014  9:18 AM  PATIENT:  Donnie Aho  63 y.o. male  PRE-OPERATIVE DIAGNOSIS:  mass left wrist   POST-OPERATIVE DIAGNOSIS:  mass left wrist  PROCEDURE:  Procedure(s): EXCISION MASS LEFT WRIST  (Left)  SURGEON:  Surgeon(s) and Role:    * Daryll Brod, MD - Primary  PHYSICIAN ASSISTANT:   ASSISTANTS: none   ANESTHESIA:   local and regional  EBL:  Total I/O In: 300 [I.V.:300] Out: -   BLOOD ADMINISTERED:none  DRAINS: none   LOCAL MEDICATIONS USED:  BUPIVICAINE   SPECIMEN:  No Specimen and Excision  DISPOSITION OF SPECIMEN:  PATHOLOGY  COUNTS:  YES  TOURNIQUET:  * Missing tourniquet times found for documented tourniquets in log:  [id number] *  DICTATION: .Other Dictation: Dictation Number [id number]  PLAN OF CARE: Discharge to home after PACU  PATIENT DISPOSITION:  PACU - hemodynamically stable.

## 2014-09-30 NOTE — Op Note (Signed)
NAME:  [name] [name], [name] [name] NO.:  [id number]  MEDICAL RECORD NO.:  [id number]  LOCATION:                                 FACILITY:  PHYSICIAN:  Daryll Brod, M.D.       DATE OF BIRTH:  10/11/1951  DATE OF PROCEDURE:  09/30/2014 DATE OF DISCHARGE:                              OPERATIVE REPORT   PREOPERATIVE DIAGNOSIS:  Mass, left wrist.  POSTOPERATIVE DIAGNOSIS:  Mass, left wrist.  OPERATION:  Excisional biopsy of mass, left wrist.  A probable foreign body granuloma deep up against the ulnar artery and nerve.  SURGEON:  Daryll Brod, MD.  ASSISTANT:  None.  ANESTHESIA:  Upper arm IV regional with local infiltration.  ANESTHESIOLOGIST:  [place].  HISTORY:  The patient is a 63 year old male suffered an injury to the left wrist over the piece of wood.  This was attempted to be removed previously, was unsuccessful on complete removal.  He has had continued pain.  A mass has formed.  He is desirous of having this excised.  Pre, peri, and postoperative course have been discussed along with risks and complications.  He is aware that there is no guarantee with the surgery, possibility of infection; recurrence of injury to arteries, nerves, tendons; incomplete relief of symptoms, dystrophy.  In preoperative area, the patient is seen, the extremity marked by both patient and surgeon.  The mass was outlined.  PROCEDURE IN DETAIL:  The patient was brought to the operating room, where an upper arm IV regional anesthetic was carried out without difficulty.  He was prepped using ChloraPrep, supine position with left arm free.  A 3-minute dry time was allowed.  Time-out taken, confirming the patient and procedure.  A longitudinal incision made incorporating distal portion of the old scar, carried down through the subcutaneous tissue.  A multilobulated mass was immediately encountered with blunt and sharp dissection, this was dissected free, proceeded through the fascia down to  the ulnar artery and ulnar nerve.  With very careful dissection, this was dissected free.  It did not interrupt either those 2 structures.  A very firm mass approximately 3 cm in length was then removed.  This appeared to be a piece of wood fully encapsulated.  The specimen was sent to Pathology.  The wound was copiously irrigated with saline.  The skin was then closed with interrupted 4-0 Vicryl Rapide sutures.  Local infiltration with 0.25% bupivacaine was then given without epinephrine.  Approximately 4 mL was used.  Sterile compressive dressing was applied with the fingers free.  On deflation of the tourniquet, all fingers immediately pinked.  He was taken to the recovery room for observation in satisfactory condition.  He will be discharged home to return to [place] in 1 week on [place].          ______________________________ Daryll Brod, M.D.     GK/MEDQ  D:  09/30/2014  T:  09/30/2014  Job:  [id number]

## 2014-10-01 ENCOUNTER — Encounter (HOSPITAL_BASED_OUTPATIENT_CLINIC_OR_DEPARTMENT_OTHER): Payer: Self-pay | Admitting: Orthopedic Surgery

## 2014-11-08 ENCOUNTER — Encounter: Payer: Self-pay | Admitting: Gastroenterology

## 2014-11-12 ENCOUNTER — Other Ambulatory Visit (INDEPENDENT_AMBULATORY_CARE_PROVIDER_SITE_OTHER): Payer: Federal, State, Local not specified - PPO | Admitting: *Deleted

## 2014-11-12 DIAGNOSIS — Z79899 Other long term (current) drug therapy: Secondary | ICD-10-CM

## 2014-11-12 DIAGNOSIS — E785 Hyperlipidemia, unspecified: Secondary | ICD-10-CM

## 2014-11-12 LAB — HEPATIC FUNCTION PANEL
ALT: 13 U/L (ref 0–53)
AST: 25 U/L (ref 0–37)
Albumin: 3.7 g/dL (ref 3.5–5.2)
Alkaline Phosphatase: 69 U/L (ref 39–117)
BILIRUBIN TOTAL: 0.8 mg/dL (ref 0.2–1.2)
Bilirubin, Direct: 0 mg/dL (ref 0.0–0.3)
Total Protein: 7.1 g/dL (ref 6.0–8.3)

## 2014-11-12 LAB — LIPID PANEL
Cholesterol: 192 mg/dL (ref 0–200)
HDL: 30.9 mg/dL — ABNORMAL LOW (ref >39.00)
LDL CALC: 139 mg/dL — AB (ref 0–99)
NONHDL: 161.1
Total CHOL/HDL Ratio: 6
Triglycerides: 109 mg/dL (ref 0.0–149.0)
VLDL: 21.8 mg/dL (ref 0.0–40.0)

## 2014-11-16 ENCOUNTER — Ambulatory Visit (INDEPENDENT_AMBULATORY_CARE_PROVIDER_SITE_OTHER): Payer: Federal, State, Local not specified - PPO | Admitting: Pharmacist Clinician (PhC)/ Clinical Pharmacy Specialist

## 2014-11-16 VITALS — Ht 72.0 in | Wt 186.5 lb

## 2014-11-16 DIAGNOSIS — E785 Hyperlipidemia, unspecified: Secondary | ICD-10-CM

## 2014-11-16 MED ORDER — EZETIMIBE 10 MG PO TABS: 10.0000 mg | ORAL_TABLET | Freq: Every day | ORAL | Status: DC

## 2014-11-16 NOTE — Patient Instructions (Addendum)
Continue with pravastatin 40 mg each day  Start Zetia 10mg  once daily.  If it causes you to have muscle problems, discontinue for 1-2 weeks then restart at 1/2 tablet daily.  Please call if you are unable to tolerate this  Recheck cholesterol labs in 3 months on Feb 16 at 9:40, return visit with Elleen Coulibaly/Sally on Feb 23 at 10:30

## 2014-11-18 ENCOUNTER — Encounter: Payer: Self-pay | Admitting: Pharmacist Clinician (PhC)/ Clinical Pharmacy Specialist

## 2014-11-18 NOTE — Progress Notes (Signed)
Patient is a 63 y.o. WM referred to lipid clinic by Dr. Harrington Challenger due to CAD and inability to tolerate Crestor and Lipitor. Pt saw [employer] on 08/17/14 and increased his pravastatin to 40mg  daily.  He has been doing well on this dose.     His LDL is ~ 185 mg/dL off therapy.  Failed Crestor 10 mg qd and lipitor 40-80 mg qd.  He has a h/o MI and DES in 2008.  Dr. Hilma Favors told patient his tendon pain was due to statin so he was fearful of taking statins in past.  Tolerating pravastatin 40 mg well.  He did try Zetia in the past he tells me, but was stopped due to lack of potency.  Patient has lost 8 lbs over past year from cutting sugar and calories from his diet.  We originally discussed possible PCSK-9 inhibitors back in 05/2014, but explained to him we will hopefully not need that now.  LDL goal < 70 mg/dL and non-HDL goal < 100 mg/dL given h/o CAD Meds:  Pravastatin 20 mg qd Intolerant:  Lipitor 40-80 mg qd, Crestor 10 mg qd  Diet:  Patient already eats a low fat diet.  Eats cheerios for breakfast in morning, occasionally eggs or breakfast sandwich, sandwich and fruit at lunch, and eats vegetables mostly for evening meal.  Social history:  Is single.  Doesn't use tobacco products.  Denies alcohol use. Family history:  Mother developed CAD in her 32's, and father had carotid disease in his late 37's.  No other history of heart disease.  Labs:  10/2014:  TC 192, TG 109, HDL 30.9, LDL 139, LFTs normal (pravastatin 40 mg qd)  07/2014:  TC 166, TG 138, HDL 30, LDL 108, LFTs normal (pravastatin 20 mg qd)  Current Outpatient Prescriptions  Medication Sig Dispense Refill  . aspirin 325 MG tablet Take 325 mg by mouth every morning.    . Astaxanthin 4 MG CAPS Take 4 mg by mouth every other day.     . Coenzyme Q10 200 MG capsule Take 200 mg by mouth daily.    Marland Kitchen ezetimibe (ZETIA) 10 MG tablet Take 1 tablet (10 mg total) by mouth daily. 30 tablet 3  . HYDROcodone-acetaminophen (NORCO) 5-325 MG per tablet Take 1  tablet by mouth every 6 (six) hours as needed for moderate pain. 30 tablet 0  . losartan-hydrochlorothiazide (HYZAAR) 100-25 MG per tablet TAKE ONE-HALF TABLET BY MOUTH ONCE DAILY IN THE MORNING 90 tablet 0  . Magnesium 500 MG CAPS Take 1,000 mg by mouth.    . nitroGLYCERIN (NITROSTAT) 0.4 MG SL tablet Place 1 tablet (0.4 mg total) under the tongue every 5 (five) minutes as needed. For chest pains 25 tablet 3  . omeprazole (PRILOSEC) 20 MG capsule Take 1 capsule (20 mg total) by mouth daily before breakfast. 90 capsule 1  . pravastatin (PRAVACHOL) 40 MG tablet Take 1 tablet (40 mg total) by mouth every evening. 90 tablet 3  . Probiotic Product (PROBIOTIC DAILY PO) Take by mouth daily.     No current facility-administered medications for this visit.   Allergies  Allergen Reactions  . Bee Venom Hives and Swelling  . Clopidogrel Bisulfate Hives    Hives  . Crestor [Rosuvastatin]     Muscle aches  . Lipitor [Atorvastatin]     Muscle aches   . Penicillins     Pt unsure what type allergy/ was a child when had reaction   Family History  Problem Relation Age of  Onset  . Dementia Mother   . Prostate cancer Father 36  . Lung cancer Sister 10  . Colon cancer Neg Hx   . Colon polyps Neg Hx   . Anesthesia problems Neg Hx   . Hypotension Neg Hx   . Malignant hyperthermia Neg Hx   . Pseudochol deficiency Neg Hx

## 2014-11-18 NOTE — Assessment & Plan Note (Signed)
LDL cholesterol still elevated, actually higher than previous labs.  Has been tolerating pravastatin 40 mg without concern.  Will add Zetia 10mg  once daily today.  Advised patient that if he develops side effects from this, to hold x 1-2 weeks, then restart at 1/2 tablet daily.  Will recheck lipid and liver panels in 3 months and see patient several days later.

## 2014-12-21 ENCOUNTER — Other Ambulatory Visit: Payer: Self-pay

## 2014-12-21 ENCOUNTER — Telehealth: Payer: Self-pay

## 2014-12-21 MED ORDER — OMEPRAZOLE 20 MG PO CPDR: 20.0000 mg | DELAYED_RELEASE_CAPSULE | Freq: Every day | ORAL | Status: DC

## 2014-12-21 NOTE — Telephone Encounter (Signed)
Patient is wanting to get his records to take to the [place]. How do we need to go about getting them for him. Please advise

## 2014-12-21 NOTE — Telephone Encounter (Signed)
The patient needs to fill out a release and we will have [facility](copying service) process that request.

## 2014-12-22 NOTE — Telephone Encounter (Signed)
LMOM for him to come by to sign a release form

## 2015-01-23 NOTE — Progress Notes (Signed)
HPI Patient is a 64 yo with a history of CAD (s/p NSTEMI in aug 2008  DES x 2 to D2; DES to LAD)  Allergic to Plavix.  Also history of HL.  I saw him in clinic in Jan 2015  He has been seen by Lipid clinic since    He is now on Pravastatin and Zetia   LDL not adequately controlled    Patinet is now on synthroid  Was on 50 mg initially  He cut back t o25 as he felt bad.  WIll need to have TSH followed.   Allergies  Allergen Reactions  . Bee Venom Hives and Swelling  . Clopidogrel Bisulfate Hives    Hives  . Crestor [Rosuvastatin]     Muscle aches  . Lipitor [Atorvastatin]     Muscle aches   . Penicillins     Pt unsure what type allergy/ was a child when had reaction    Current Outpatient Prescriptions  Medication Sig Dispense Refill  . aspirin 325 MG tablet Take 325 mg by mouth every morning.    . Astaxanthin 4 MG CAPS Take 4 mg by mouth every other day.     . Coenzyme Q10 200 MG capsule Take 200 mg by mouth daily.    Marland Kitchen ezetimibe (ZETIA) 10 MG tablet Take 1 tablet (10 mg total) by mouth daily. 30 tablet 3  . HYDROcodone-acetaminophen (NORCO) 5-325 MG per tablet Take 1 tablet by mouth every 6 (six) hours as needed for moderate pain. 30 tablet 0  . levothyroxine (SYNTHROID, LEVOTHROID) 50 MCG tablet Take 50 mcg by mouth daily. Pt splits pill into 1/2 tab for 25 mcg by mouth daily.    Marland Kitchen losartan-hydrochlorothiazide (HYZAAR) 100-25 MG per tablet TAKE ONE-HALF TABLET BY MOUTH ONCE DAILY IN THE MORNING 90 tablet 0  . Magnesium 100 MG CAPS Take 200 mg by mouth daily.    . nitroGLYCERIN (NITROSTAT) 0.4 MG SL tablet Place 1 tablet (0.4 mg total) under the tongue every 5 (five) minutes as needed. For chest pains 25 tablet 3  . omeprazole (PRILOSEC) 20 MG capsule Take 1 capsule (20 mg total) by mouth daily before breakfast. 90 capsule 3  . pravastatin (PRAVACHOL) 40 MG tablet Take 1 tablet (40 mg total) by mouth every evening. 90 tablet 3  . Probiotic Product (PROBIOTIC DAILY PO) Take by mouth  daily.     No current facility-administered medications for this visit.    Past Medical History  Diagnosis Date  . HTN (hypertension)   . Hyperlipidemia   . Avascular necrosis of bone of hip     right  . Anxiety   . Paresthesias   . GERD (gastroesophageal reflux disease)   . Myocardial infarction   . CAD (coronary artery disease)     stents x3 2008  . Arthritis     back pain    Past Surgical History  Procedure Laterality Date  . Tonsillectomy  age 74  . Total hip arthroplasty  age 65    right hip for avascular necrosis of hip and spine  . Colonoscopy  2011 NUR [facility] SCREENING d50 v5    MILD [place] TICS, ? PREP ARTIFACT V. PROCTITIS-rX:[place]  . Cardiac catheterization      3 stents placed  . Esophagogastroduodenoscopy  10/03/11    small hiatal hernia/gastric ulcers/stricutre in distal esophagus  . Mass excision Left 09/30/2014    Procedure: EXCISION MASS LEFT WRIST ;  Surgeon: Daryll Brod, MD;  Location: [facility];  Service: Orthopedics;  Laterality: Left;    Family History  Problem Relation Age of Onset  . Dementia Mother   . Prostate cancer Father 66  . Lung cancer Sister 2  . Colon cancer Neg Hx   . Colon polyps Neg Hx   . Anesthesia problems Neg Hx   . Hypotension Neg Hx   . Malignant hyperthermia Neg Hx   . Pseudochol deficiency Neg Hx     History   Social History  . Marital Status: Single    Spouse Name: N/A    Number of Children: N/A  . Years of Education: N/A   Occupational History  . Not on file.   Social History Main Topics  . Smoking status: Former Smoker -- 1.00 packs/day for 30 years    Types: Cigarettes    Quit date: 09/30/2007  . Smokeless tobacco: Never Used  . Alcohol Use: No  . Drug Use: No  . Sexual Activity: Not on file   Other Topics Concern  . Not on file   Social History Narrative    Review of Systems:  All systems reviewed.  They are negative to the above problem except as previously stated.  Vital Signs: BP  140/94 mmHg  Pulse 63  Ht 6' (1.829 m)  Wt 186 lb (84.369 kg)  BMI 25.22 kg/m2  Physical Exam Patient is in NAD HEENT:  Normocephalic, atraumatic. EOMI, PERRLA.  Neck: JVP is normal.  No bruits.  Lungs: clear to auscultation. No rales no wheezes.  Heart: Regular rate and rhythm. Normal S1, S2. No S3.   No significant murmurs. PMI not displaced.  Abdomen:  Supple, nontender. Normal bowel sounds. No masses. No hepatomegaly.  Extremities:   Good distal pulses throughout. No lower extremity edema.  Musculoskeletal :moving all extremities.  Neuro:   alert and oriented x3.  CN II-XII grossly intact.  EKG  SR 63 bpm   Assessment and Plan:  1.  CAD  No sympotms of angina    2.  HL WIll need to have lipids check on pravachol and zetia      Encouraged patient to stay active

## 2015-01-24 ENCOUNTER — Encounter: Payer: Self-pay | Admitting: Internal Medicine

## 2015-01-24 ENCOUNTER — Ambulatory Visit (INDEPENDENT_AMBULATORY_CARE_PROVIDER_SITE_OTHER): Payer: Federal, State, Local not specified - PPO | Admitting: Internal Medicine

## 2015-01-24 VITALS — BP 140/94 | HR 63 | Ht 72.0 in | Wt 186.0 lb

## 2015-01-24 DIAGNOSIS — I10 Essential (primary) hypertension: Secondary | ICD-10-CM

## 2015-01-24 MED ORDER — LOSARTAN POTASSIUM-HCTZ 100-25 MG PO TABS: 0.5000 | ORAL_TABLET | Freq: Two times a day (BID) | ORAL | Status: DC

## 2015-01-24 MED ORDER — EZETIMIBE 10 MG PO TABS: 10.0000 mg | ORAL_TABLET | Freq: Every day | ORAL | Status: DC

## 2015-01-24 NOTE — Patient Instructions (Signed)
Your physician has recommended you make the following change in your medication:  1.) increase losartan/hctz to 1/2 tab in AM and 1/2 tab in PM  Your physician wants you to follow-up in: 1 year with Dr. Harrington Challenger. You will receive a reminder letter in the mail two months in advance. If you don't receive a letter, please call our office to schedule the follow-up appointment.  Please check your blood pressure at home.  Keep a list.  Call with any concerns.

## 2015-01-27 ENCOUNTER — Telehealth: Payer: Self-pay | Admitting: *Deleted

## 2015-01-27 DIAGNOSIS — I2510 Atherosclerotic heart disease of native coronary artery without angina pectoris: Secondary | ICD-10-CM

## 2015-01-27 NOTE — Telephone Encounter (Signed)
Fay Records, MD   Sent: Mon January 24, 2015 10:52 PM    To: Rodman Key, RN        Message     PLease add TSH to lab draw when comes in for lipid panel

## 2015-02-15 ENCOUNTER — Other Ambulatory Visit (INDEPENDENT_AMBULATORY_CARE_PROVIDER_SITE_OTHER): Payer: Federal, State, Local not specified - PPO | Admitting: *Deleted

## 2015-02-15 DIAGNOSIS — I10 Essential (primary) hypertension: Secondary | ICD-10-CM

## 2015-02-15 DIAGNOSIS — I2510 Atherosclerotic heart disease of native coronary artery without angina pectoris: Secondary | ICD-10-CM

## 2015-02-15 DIAGNOSIS — E785 Hyperlipidemia, unspecified: Secondary | ICD-10-CM

## 2015-02-15 LAB — HEPATIC FUNCTION PANEL
ALBUMIN: 4.1 g/dL (ref 3.5–5.2)
ALT: 17 U/L (ref 0–53)
AST: 30 U/L (ref 0–37)
Alkaline Phosphatase: 74 U/L (ref 39–117)
BILIRUBIN TOTAL: 0.5 mg/dL (ref 0.2–1.2)
Bilirubin, Direct: 0.1 mg/dL (ref 0.0–0.3)
Total Protein: 6.8 g/dL (ref 6.0–8.3)

## 2015-02-15 LAB — LIPID PANEL
CHOL/HDL RATIO: 4
CHOLESTEROL: 134 mg/dL (ref 0–200)
HDL: 34.7 mg/dL — ABNORMAL LOW (ref >39.00)
LDL CALC: 81 mg/dL (ref 0–99)
NONHDL: 99.3
Triglycerides: 94 mg/dL (ref 0.0–149.0)
VLDL: 18.8 mg/dL (ref 0.0–40.0)

## 2015-02-15 LAB — BASIC METABOLIC PANEL
BUN: 14 mg/dL (ref 6–23)
CALCIUM: 9.2 mg/dL (ref 8.4–10.5)
CO2: 30 meq/L (ref 19–32)
Chloride: 102 mEq/L (ref 96–112)
Creatinine, Ser: 0.95 mg/dL (ref 0.40–1.50)
GFR: 84.77 mL/min (ref >60.00)
Glucose, Bld: 107 mg/dL — ABNORMAL HIGH (ref 70–99)
Potassium: 3.9 mEq/L (ref 3.5–5.1)
SODIUM: 137 meq/L (ref 135–145)

## 2015-02-15 LAB — TSH: TSH: 3.72 u[IU]/mL (ref 0.35–4.50)

## 2015-02-22 ENCOUNTER — Ambulatory Visit (INDEPENDENT_AMBULATORY_CARE_PROVIDER_SITE_OTHER): Payer: Federal, State, Local not specified - PPO | Admitting: Pharmacist Clinician (PhC)/ Clinical Pharmacy Specialist

## 2015-02-22 ENCOUNTER — Encounter: Payer: Self-pay | Admitting: Pharmacist Clinician (PhC)/ Clinical Pharmacy Specialist

## 2015-02-22 DIAGNOSIS — E785 Hyperlipidemia, unspecified: Secondary | ICD-10-CM

## 2015-02-22 NOTE — Patient Instructions (Addendum)
Continue with Zetia 10 mg daily and pravastatin 40 mg daily  Continue with your high fiber diet.  Repeat cholesterol labs in 6 months - August 22 at 8:30

## 2015-02-22 NOTE — Assessment & Plan Note (Signed)
Pt LDL is much improved today at 81, with nonHDL at 99.3.  Pt continues to follow healthy diet, as well as taking pravastatin 40 and ezetimibe 10.  He asked about increasing pravastatin to 80 mg to get another 10 point drop in his LDL, but my concern would be with his tendency to muscle problems on statins.  I don't want to risk him developing problems on the higher dose then have to cut it out and re-challenge at 40 mg.  Pt agreed.  We talked about continuing with the high fiber diet, including almonds.  Also encouraged patient to exercise on a regular basis.  Will repeat lipid/hepatic labs in 6 months.  Consider follow up appointment at that time should be needed.

## 2015-02-22 NOTE — Progress Notes (Signed)
Patient is a 64 y.o. WM referred to lipid clinic by Dr. Harrington Challenger due to CAD and inability to tolerate Crestor and Lipitor. Pt saw [employer] on 08/17/14 and increased his pravastatin to 40mg  daily.  His LDL is ~ 185 mg/dL off therapy.  Failed Crestor 10 mg qd and lipitor 40-80 mg qd.  He has a h/o MI and DES in 2008.  Dr. Hilma Favors told patient his tendon pain was due to statin so he was fearful of taking statins in past.  Tolerating pravastatin 40 mg well.  He did try Zetia in the past he tells me, but was stopped due to lack of potency.  Patient has lost 8 lbs over past year from cutting sugar and calories from his diet.  We originally discussed possible PCSK-9 inhibitors back in 05/2014, but explained to him we will hopefully not need that now.  LDL goal < 70 mg/dL and non-HDL goal < 100 mg/dL given h/o CAD Meds:  Pravastatin 20 mg qd Intolerant:  Lipitor 40-80 mg qd, Crestor 10 mg qd  Diet:  Patient already eats a low fat diet.  Eats cheerios for breakfast in morning, occasionally eggs or breakfast sandwich, sandwich and fruit at lunch, and eats vegetables mostly for evening meal.  Social history:  Is single.  Doesn't use tobacco products.  Denies alcohol use. Family history:  Mother developed CAD in her 49's, and father had carotid disease in his late 6's.  No other history of heart disease.  Labs:  01/2015:   TC 134, TG 94, HDL 34.7, LDL 81, nonHDL 99.3 LFTs normal (pravastatin 40, ezetimibe 10) 10/2014:  TC 192, TG 109, HDL 30.9, LDL 139, LFTs normal (pravastatin 40 mg qd)  07/2014:  TC 166, TG 138, HDL 30, LDL 108, LFTs normal (pravastatin 20 mg qd)  Current Outpatient Prescriptions  Medication Sig Dispense Refill  . aspirin 325 MG tablet Take 325 mg by mouth every morning.    . Astaxanthin 4 MG CAPS Take 4 mg by mouth every other day.     . Coenzyme Q10 200 MG capsule Take 200 mg by mouth daily.    Marland Kitchen ezetimibe (ZETIA) 10 MG tablet Take 1 tablet (10 mg total) by mouth daily. 90 tablet 3  .  HYDROcodone-acetaminophen (NORCO) 5-325 MG per tablet Take 1 tablet by mouth every 6 (six) hours as needed for moderate pain. 30 tablet 0  . levothyroxine (SYNTHROID, LEVOTHROID) 50 MCG tablet Take 50 mcg by mouth daily. Pt splits pill into 1/2 tab for 25 mcg by mouth daily.    Marland Kitchen losartan-hydrochlorothiazide (HYZAAR) 100-25 MG per tablet Take 0.5 tablets by mouth 2 (two) times daily. 90 tablet 3  . Magnesium 100 MG CAPS Take 200 mg by mouth daily.    . nitroGLYCERIN (NITROSTAT) 0.4 MG SL tablet Place 1 tablet (0.4 mg total) under the tongue every 5 (five) minutes as needed. For chest pains 25 tablet 3  . omeprazole (PRILOSEC) 20 MG capsule Take 1 capsule (20 mg total) by mouth daily before breakfast. 90 capsule 3  . pravastatin (PRAVACHOL) 40 MG tablet Take 1 tablet (40 mg total) by mouth every evening. 90 tablet 3  . Probiotic Product (PROBIOTIC DAILY PO) Take by mouth daily.     No current facility-administered medications for this visit.   Allergies  Allergen Reactions  . Bee Venom Hives and Swelling  . Clopidogrel Bisulfate Hives    Hives  . Crestor [Rosuvastatin]     Muscle aches  . Lipitor [  Atorvastatin]     Muscle aches   . Penicillins     Pt unsure what type allergy/ was a child when had reaction   Family History  Problem Relation Age of Onset  . Dementia Mother   . Prostate cancer Father 79  . Lung cancer Sister 68  . Colon cancer Neg Hx   . Colon polyps Neg Hx   . Anesthesia problems Neg Hx   . Hypotension Neg Hx   . Malignant hyperthermia Neg Hx   . Pseudochol deficiency Neg Hx

## 2015-07-07 ENCOUNTER — Other Ambulatory Visit: Payer: Self-pay | Admitting: Internal Medicine

## 2015-07-12 ENCOUNTER — Telehealth: Payer: Self-pay | Admitting: General Practice

## 2015-07-12 NOTE — Telephone Encounter (Signed)
Discussed with Rosendo Gros who took the call. Patient is wanting to switch from omeprazole to famotidine. He has not bee seen in 10 months. Cancelled his previous EGD in 08/2014.   Really needs OV prior to prescribing new medication to make sure it is appropriate.

## 2015-07-12 NOTE — Telephone Encounter (Signed)
Pateint is currently taking Omeprazole 20 mg once a day, however to switch to Famotidine.  He was last seen in 08/2014 when he had a procedure by [facility].  Routing to Refill box

## 2015-07-12 NOTE — Telephone Encounter (Signed)
Pt is aware and has OV with Magda Paganini on 08/10/2015 @ 2:00 pm to discuss meds and he also has hemorrhoids and would like that addressed.

## 2015-08-10 ENCOUNTER — Ambulatory Visit: Payer: Federal, State, Local not specified - PPO | Admitting: Gastroenterology

## 2015-08-22 ENCOUNTER — Other Ambulatory Visit (INDEPENDENT_AMBULATORY_CARE_PROVIDER_SITE_OTHER): Payer: Federal, State, Local not specified - PPO

## 2015-08-22 DIAGNOSIS — E785 Hyperlipidemia, unspecified: Secondary | ICD-10-CM | POA: Diagnosis not present

## 2015-08-22 LAB — LIPID PANEL
CHOLESTEROL: 145 mg/dL (ref 0–200)
HDL: 30.5 mg/dL — ABNORMAL LOW (ref >39.00)
LDL Cholesterol: 88 mg/dL (ref 0–99)
NonHDL: 114.61
TRIGLYCERIDES: 133 mg/dL (ref 0.0–149.0)
Total CHOL/HDL Ratio: 5
VLDL: 26.6 mg/dL (ref 0.0–40.0)

## 2015-08-22 LAB — HEPATIC FUNCTION PANEL
ALK PHOS: 73 U/L (ref 39–117)
ALT: 18 U/L (ref 0–53)
AST: 29 U/L (ref 0–37)
Albumin: 4.1 g/dL (ref 3.5–5.2)
BILIRUBIN DIRECT: 0.1 mg/dL (ref 0.0–0.3)
TOTAL PROTEIN: 6.7 g/dL (ref 6.0–8.3)
Total Bilirubin: 0.4 mg/dL (ref 0.2–1.2)

## 2015-11-21 DIAGNOSIS — R351 Nocturia: Secondary | ICD-10-CM | POA: Diagnosis not present

## 2015-11-21 DIAGNOSIS — N503 Cyst of epididymis: Secondary | ICD-10-CM | POA: Diagnosis not present

## 2015-11-21 DIAGNOSIS — N401 Enlarged prostate with lower urinary tract symptoms: Secondary | ICD-10-CM | POA: Diagnosis not present

## 2015-11-29 ENCOUNTER — Telehealth: Payer: Self-pay

## 2015-11-29 NOTE — Telephone Encounter (Signed)
Patient Assistance Application for Zetia mailed to [employer].

## 2015-12-19 DIAGNOSIS — E039 Hypothyroidism, unspecified: Secondary | ICD-10-CM | POA: Diagnosis not present

## 2015-12-19 DIAGNOSIS — R7309 Other abnormal glucose: Secondary | ICD-10-CM | POA: Diagnosis not present

## 2015-12-19 DIAGNOSIS — Z23 Encounter for immunization: Secondary | ICD-10-CM | POA: Diagnosis not present

## 2015-12-19 DIAGNOSIS — Z681 Body mass index (BMI) 19 or less, adult: Secondary | ICD-10-CM | POA: Diagnosis not present

## 2015-12-19 DIAGNOSIS — Z1389 Encounter for screening for other disorder: Secondary | ICD-10-CM | POA: Diagnosis not present

## 2016-01-16 ENCOUNTER — Other Ambulatory Visit: Payer: Self-pay | Admitting: Internal Medicine

## 2016-01-30 ENCOUNTER — Encounter: Payer: Self-pay | Admitting: Internal Medicine

## 2016-01-30 ENCOUNTER — Ambulatory Visit (INDEPENDENT_AMBULATORY_CARE_PROVIDER_SITE_OTHER): Payer: Medicare Other | Admitting: Internal Medicine

## 2016-01-30 ENCOUNTER — Other Ambulatory Visit: Payer: Self-pay | Admitting: Gastroenterology

## 2016-01-30 VITALS — BP 132/90 | HR 66 | Ht 72.0 in | Wt 182.4 lb

## 2016-01-30 DIAGNOSIS — E785 Hyperlipidemia, unspecified: Secondary | ICD-10-CM | POA: Diagnosis not present

## 2016-01-30 DIAGNOSIS — I10 Essential (primary) hypertension: Secondary | ICD-10-CM

## 2016-01-30 DIAGNOSIS — I2510 Atherosclerotic heart disease of native coronary artery without angina pectoris: Secondary | ICD-10-CM | POA: Diagnosis not present

## 2016-01-30 LAB — LIPID PANEL
CHOL/HDL RATIO: 4.5 ratio (ref <=5.0)
Cholesterol: 125 mg/dL (ref 125–200)
HDL: 28 mg/dL — AB (ref >=40)
LDL CALC: 73 mg/dL (ref <130)
TRIGLYCERIDES: 119 mg/dL (ref <150)
VLDL: 24 mg/dL (ref <30)

## 2016-01-30 LAB — BASIC METABOLIC PANEL
BUN: 15 mg/dL (ref 7–25)
CHLORIDE: 101 mmol/L (ref 98–110)
CO2: 27 mmol/L (ref 20–31)
Calcium: 9.3 mg/dL (ref 8.6–10.3)
Creat: 0.9 mg/dL (ref 0.70–1.25)
Glucose, Bld: 107 mg/dL — ABNORMAL HIGH (ref 65–99)
POTASSIUM: 4.6 mmol/L (ref 3.5–5.3)
SODIUM: 136 mmol/L (ref 135–146)

## 2016-01-30 LAB — CBC
HEMATOCRIT: 43.5 % (ref 39.0–52.0)
HEMOGLOBIN: 14.4 g/dL (ref 13.0–17.0)
MCH: 29.7 pg (ref 26.0–34.0)
MCHC: 33.1 g/dL (ref 30.0–36.0)
MCV: 89.7 fL (ref 78.0–100.0)
MPV: 9.2 fL (ref 8.6–12.4)
Platelets: 253 10*3/uL (ref 150–400)
RBC: 4.85 MIL/uL (ref 4.22–5.81)
RDW: 14.1 % (ref 11.5–15.5)
WBC: 7.6 10*3/uL (ref 4.0–10.5)

## 2016-01-30 LAB — AST: AST: 26 U/L (ref 10–35)

## 2016-01-30 MED ORDER — EZETIMIBE 10 MG PO TABS: 10.0000 mg | ORAL_TABLET | Freq: Every day | ORAL | Status: DC

## 2016-01-30 MED ORDER — PRAVASTATIN SODIUM 40 MG PO TABS: ORAL_TABLET | ORAL | Status: DC

## 2016-01-30 MED ORDER — LOSARTAN POTASSIUM-HCTZ 100-25 MG PO TABS: 0.5000 | ORAL_TABLET | Freq: Two times a day (BID) | ORAL | Status: DC

## 2016-01-30 NOTE — Patient Instructions (Signed)
Your physician recommends that you continue on your current medications as directed. Please refer to the Current Medication list given to you today. Your physician recommends that you return for lab work today (CBC, BMET, LIPIDS, AST)  Your physician wants you to follow-up in: [place].  You will receive a reminder letter in the mail two months in advance. If you don't receive a letter, please call our office to schedule the follow-up appointment.

## 2016-01-30 NOTE — Progress Notes (Signed)
Cardiology Office Note   Date:  01/30/2016   ID:  [name], [name] June 01, 1951, MRN [id number]  PCP:  Purvis Kilts, MD  Cardiologist:   Dorris Carnes, MD   F/U of CAD     History of Present Illness: [name] [name] is a 65 y.o. male with a history of CAD (s/p NSTEMI in aug 2008 DES x 2 to D2; DES to LAD) Allergic to Plavix. Also history of HL. I saw him in clinic in Jan 2015 Last lipid in Aug LDL was 88  Recomm increased diet control  Continue meds    Since I saw him he remains active  No CP with ativity  Does have problems with food getting stuck  Pills stay in esophagus   Seen by GI  Does not want endoscopy now. Has reflux  Omeprazole worked by he is worried about addiction  Zantac he says does not work as well    Current Outpatient Prescriptions  Medication Sig Dispense Refill  . aspirin 325 MG tablet Take 325 mg by mouth every morning.    . Astaxanthin 4 MG CAPS Take 4 mg by mouth every other day.     . Coenzyme Q10 200 MG capsule Take 200 mg by mouth daily.    Marland Kitchen ezetimibe (ZETIA) 10 MG tablet Take 1 tablet (10 mg total) by mouth daily. 90 tablet 3  . levothyroxine (SYNTHROID, LEVOTHROID) 50 MCG tablet Take 50 mcg by mouth daily.     Marland Kitchen losartan-hydrochlorothiazide (HYZAAR) 100-25 MG tablet TAKE ONE-HALF TABLET BY MOUTH TWICE DAILY 90 tablet 0  . Magnesium 100 MG CAPS Take 200 mg by mouth daily.    . nitroGLYCERIN (NITROSTAT) 0.4 MG SL tablet Place 1 tablet (0.4 mg total) under the tongue every 5 (five) minutes as needed. For chest pains 25 tablet 3  . omeprazole (PRILOSEC) 20 MG capsule Take 1 capsule (20 mg total) by mouth daily before breakfast. 90 capsule 3  . pravastatin (PRAVACHOL) 40 MG tablet TAKE ONE TABLET BY MOUTH ONCE DAILY IN THE EVENING 90 tablet 2  . Probiotic Product (PROBIOTIC DAILY PO) Take by mouth daily.     No current facility-administered medications for this visit.    Allergies:   Bee venom; Clopidogrel bisulfate; Crestor; Lipitor; and  Penicillins   Past Medical History  Diagnosis Date  . HTN (hypertension)   . Hyperlipidemia   . Avascular necrosis of bone of hip ([place])     right  . Anxiety   . Paresthesias   . GERD (gastroesophageal reflux disease)   . Myocardial infarction ([place])   . CAD (coronary artery disease)     stents x3 2008  . Arthritis     back pain    Past Surgical History  Procedure Laterality Date  . Tonsillectomy  age 25  . Total hip arthroplasty  age 85    right hip for avascular necrosis of hip and spine  . Colonoscopy  2011 N[facility] SCREENING d50 v5    MILD [place] TICS, ? PREP ARTIFACT V. PROCTITIS-rX:[place]  . Cardiac catheterization      3 stents placed  . Esophagogastroduodenoscopy  10/03/11    small hiatal hernia/gastric ulcers/stricutre in distal esophagus  . Mass excision Left 09/30/2014    Procedure: EXCISION MASS LEFT WRIST ;  Surgeon: Daryll Brod, MD;  Location: [place];  Service: Orthopedics;  Laterality: Left;     Social History:  The patient  reports that he quit smoking about  8 years ago. His smoking use included Cigarettes. He has a 30 pack-year smoking history. He has never used smokeless tobacco. He reports that he does not drink alcohol or use illicit drugs.   Family History:  The patient's family history includes Dementia in his mother; Lung cancer (age of onset: 34) in his sister; Prostate cancer (age of onset: 70) in his father. There is no history of Colon cancer, Colon polyps, Anesthesia problems, Hypotension, Malignant hyperthermia, or Pseudochol deficiency.    ROS:  Please see the history of present illness. All other systems are reviewed and  Negative to the above problem except as noted.    PHYSICAL EXAM: VS:  BP 132/90 mmHg  Pulse 66  Ht 6' (1.829 m)  Wt 182 lb 6.4 oz (82.736 kg)  BMI 24.73 kg/m2   On My check 125/90  Pt didn't take meds today   GEN: Well nourished, well developed, in no acute distress HEENT: normal Neck: no JVD, carotid  bruits, or masses Cardiac: RRR; no murmurs, rubs, or gallops,no edema  Respiratory:  clear to auscultation bilaterally, normal work of breathing GI: soft, nontender, nondistended, + BS  No hepatomegaly  MS: no deformity Moving all extremities   Skin: warm and dry, no rash Neuro:  Strength and sensation are intact Psych: euthymic mood, full affect   EKG:  EKG is ordered today.  SR 64 bpm     Lipid Panel    Component Value Date/Time   CHOL 145 08/22/2015 1021   TRIG 133.0 08/22/2015 1021   TRIG 107 08/15/2009 1335   HDL 30.50* 08/22/2015 1021   CHOLHDL 5 08/22/2015 1021   VLDL 26.6 08/22/2015 1021   LDLCALC 88 08/22/2015 1021   LDLCALC 87 08/15/2009 1335      Wt Readings from Last 3 Encounters:  01/30/16 182 lb 6.4 oz (82.736 kg)  01/24/15 186 lb (84.369 kg)  11/18/14 186 lb 8 oz (84.596 kg)      ASSESSMENT AND PLAN:  1  CAD  I am not convinced of any active symptoms  Continue current regimen  2. HL  Will check fasting lipids   3.  GI  Pt followed in GI clinic in [place]  It sounds like he has a stricture  Encouraged him to review with GI  4.  HTN  Diastolic is 90 on my check  He did not take meds yet ths AM  Usually takes them at 11AM      Current medicines are reviewed at length with the patient today.  The patient does not have concerns regarding medicines.    Disposition:   FU with me in 1 year   Signed, Dorris Carnes, MD  01/30/2016 9:23 AM    [address], [place], [place]  [address] Phone: [phone]; Fax: [phone]

## 2016-02-08 ENCOUNTER — Other Ambulatory Visit: Payer: Self-pay

## 2016-02-08 ENCOUNTER — Ambulatory Visit (INDEPENDENT_AMBULATORY_CARE_PROVIDER_SITE_OTHER): Payer: Medicare Other | Admitting: Gastroenterology

## 2016-02-08 ENCOUNTER — Encounter: Payer: Self-pay | Admitting: Gastroenterology

## 2016-02-08 VITALS — BP 120/81 | HR 78 | Temp 98.2°F | Ht 69.0 in | Wt 183.0 lb

## 2016-02-08 DIAGNOSIS — I2510 Atherosclerotic heart disease of native coronary artery without angina pectoris: Secondary | ICD-10-CM

## 2016-02-08 DIAGNOSIS — R1310 Dysphagia, unspecified: Secondary | ICD-10-CM

## 2016-02-08 DIAGNOSIS — R1314 Dysphagia, pharyngoesophageal phase: Secondary | ICD-10-CM

## 2016-02-08 DIAGNOSIS — K219 Gastro-esophageal reflux disease without esophagitis: Secondary | ICD-10-CM

## 2016-02-08 DIAGNOSIS — K649 Unspecified hemorrhoids: Secondary | ICD-10-CM | POA: Insufficient documentation

## 2016-02-08 DIAGNOSIS — R1319 Other dysphagia: Secondary | ICD-10-CM

## 2016-02-08 MED ORDER — PANTOPRAZOLE SODIUM 20 MG PO TBEC
20.0000 mg | DELAYED_RELEASE_TABLET | Freq: Every day | ORAL | Status: DC
Start: 2016-02-08 — End: 2016-03-02

## 2016-02-08 NOTE — Progress Notes (Signed)
Primary Care Physician:  Purvis Kilts, MD  Primary Gastroenterologist:  Barney Drain, MD   Chief Complaint  Patient presents with  . Advice Only  . Gastroesophageal Reflux    HPI:  [name] [name] is a 65 y.o. male with a history of gastric ulcers, esophageal stricture on previous EGD in 2012 who presents for follow-up. He was last seen in the office in September 2015. Was offered an EGD at that time but he canceled.  Patient states he had weaned himself off of omeprazole since we last saw him. He started having recurrent issues of heartburn and atypical chest pain. Difficulty swallowing pills and solid food. Episodes of near impaction at times. Of note he recently saw his cardiologist who recommended GI follow-up. Took an omeprazole one evening prior to bed. Woke up in the middle the night feeling like he couldn't breathe, mouth was dry. Symptoms resolved without any intervention. He wonders if he had a reaction to the omeprazole. Tells me that he came off of the omeprazole because he thought it was causing him some memory issues. Felt like he was in a fall all the time. States it improved off of the omeprazole. He also notes that he has some itching and painful hemorrhoids off and on. He read our [place] banding pamphlet and wants to consider this procedure.   Colonoscopy in 2011 by Dr. Laural Golden, have requested records.     Current Outpatient Prescriptions  Medication Sig Dispense Refill  . aspirin 325 MG tablet Take 325 mg by mouth every morning.    . Astaxanthin 4 MG CAPS Take 4 mg by mouth every other day.     . calcium carbonate (TUMS EX) 750 MG chewable tablet Chew 1 tablet by mouth 3 (three) times daily.    . Coenzyme Q10 200 MG capsule Take 200 mg by mouth daily.    Marland Kitchen ezetimibe (ZETIA) 10 MG tablet Take 1 tablet (10 mg total) by mouth daily. 90 tablet 3  . levothyroxine (SYNTHROID, LEVOTHROID) 50 MCG tablet Take 50 mcg by mouth daily.     Marland Kitchen losartan-hydrochlorothiazide (HYZAAR)  100-25 MG tablet Take 0.5 tablets by mouth 2 (two) times daily. 90 tablet 3  . Magnesium 100 MG CAPS Take 200 mg by mouth daily.    . nitroGLYCERIN (NITROSTAT) 0.4 MG SL tablet Place 1 tablet (0.4 mg total) under the tongue every 5 (five) minutes as needed. For chest pains 25 tablet 3  . pravastatin (PRAVACHOL) 40 MG tablet TAKE ONE TABLET BY MOUTH ONCE DAILY IN THE EVENING 90 tablet 3  . Probiotic Product (PROBIOTIC DAILY PO) Take by mouth daily.    Marland Kitchen omeprazole (PRILOSEC) 20 MG capsule TAKE ONE CAPSULE BY MOUTH ONCE DAILY BEFORE BREAKFAST (Patient not taking: Reported on 02/08/2016) 90 capsule 3   No current facility-administered medications for this visit.    Allergies as of 02/08/2016 - Review Complete 02/08/2016  Allergen Reaction Noted  . Bee venom Hives and Swelling 09/24/2011  . Clopidogrel bisulfate Hives 02/20/2010  . Crestor [rosuvastatin]  08/17/2014  . Lipitor [atorvastatin]  08/17/2014  . Penicillins  02/20/2010    Past Medical History  Diagnosis Date  . HTN (hypertension)   . Hyperlipidemia   . Avascular necrosis of bone of hip ([place])     right  . Anxiety   . Paresthesias   . GERD (gastroesophageal reflux disease)   . Myocardial infarction ([place])   . CAD (coronary artery disease)     stents x3 2008  .  Arthritis     back pain    Past Surgical History  Procedure Laterality Date  . Tonsillectomy  age 51  . Total hip arthroplasty  age 11    right hip for avascular necrosis of hip and spine  . Colonoscopy  2011 NUR [facility] SCREENING d50 v5    MILD [place] TICS, ? PREP ARTIFACT V. PROCTITIS-rX:[place]  . Cardiac catheterization      3 stents placed  . Esophagogastroduodenoscopy  10/03/11    small hiatal hernia/gastric ulcers/stricutre in distal esophagus  . Mass excision Left 09/30/2014    Procedure: EXCISION MASS LEFT WRIST ;  Surgeon: Daryll Brod, MD;  Location: [place];  Service: Orthopedics;  Laterality: Left;    Family History  Problem Relation Age of  Onset  . Dementia Mother   . Prostate cancer Father 53  . Lung cancer Sister 72  . Colon cancer Neg Hx   . Colon polyps Neg Hx   . Anesthesia problems Neg Hx   . Hypotension Neg Hx   . Malignant hyperthermia Neg Hx   . Pseudochol deficiency Neg Hx     Social History   Social History  . Marital Status: Single    Spouse Name: N/A  . Number of Children: N/A  . Years of Education: N/A   Occupational History  . Not on file.   Social History Main Topics  . Smoking status: Former Smoker -- 1.00 packs/day for 30 years    Types: Cigarettes    Quit date: 09/30/2007  . Smokeless tobacco: Never Used  . Alcohol Use: No  . Drug Use: No  . Sexual Activity: Not on file   Other Topics Concern  . Not on file   Social History Narrative      ROS:  General: Negative for anorexia, weight loss, fever, chills, fatigue, weakness. Eyes: Negative for vision changes.  ENT: Negative for hoarseness, difficulty swallowing , nasal congestion. CV: Negative for chest pain, angina, palpitations, dyspnea on exertion, peripheral edema.  Respiratory: Negative for dyspnea at rest, dyspnea on exertion, cough, sputum, wheezing.  GI: See history of present illness. GU:  Negative for dysuria, hematuria, urinary incontinence, urinary frequency, nocturnal urination.  MS: Negative for joint pain, low back pain.  Derm: Negative for rash or itching.  Neuro: Negative for weakness, abnormal sensation, seizure, frequent headaches, memory loss, confusion.  Psych: Negative for anxiety, depression, suicidal ideation, hallucinations.  Endo: Negative for unusual weight change.  Heme: Negative for bruising or bleeding. Allergy: Negative for rash or hives.    Physical Examination:  BP 120/81 mmHg  Pulse 78  Temp(Src) 98.2 F (36.8 C) (Oral)  Ht 5\' 9"  (1.753 m)  Wt 183 lb (83.008 kg)  BMI 27.01 kg/m2   General: Well-nourished, well-developed in no acute distress.  Head: Normocephalic, atraumatic.   Eyes:  Conjunctiva pink, no icterus. Mouth: Oropharyngeal mucosa moist and pink , no lesions erythema or exudate. Neck: Supple without thyromegaly, masses, or lymphadenopathy.  Lungs: Clear to auscultation bilaterally.  Heart: Regular rate and rhythm, no murmurs rubs or gallops.  Abdomen: Bowel sounds are normal, nontender, nondistended, no hepatosplenomegaly or masses, no abdominal bruits or    hernia , no rebound or guarding.   Rectal: not performed Extremities: No lower extremity edema. No clubbing or deformities.  Neuro: Alert and oriented x 4 , grossly normal neurologically.  Skin: Warm and dry, no rash or jaundice.   Psych: Alert and cooperative, normal mood and affect.  Labs: Lab Results  Component Value Date   CREATININE 0.90 01/30/2016   BUN 15 01/30/2016   NA 136 01/30/2016   K 4.6 01/30/2016   CL 101 01/30/2016   CO2 27 01/30/2016   Lab Results  Component Value Date   ALT 18 08/22/2015   AST 26 01/30/2016   ALKPHOS 73 08/22/2015   BILITOT 0.4 08/22/2015   Lab Results  Component Value Date   WBC 7.6 01/30/2016   HGB 14.4 01/30/2016   HCT 43.5 01/30/2016   MCV 89.7 01/30/2016   PLT 253 01/30/2016     Imaging Studies: No results found.  Impression/plan:  65 year old gentleman with history of esophageal stricture/small peptic ulcer disease with H. pylori in 2012 who presents for follow-up. He has developed recurrent heartburn, atypical chest pain in the setting of coming off of his PPI. He felt like omeprazole was causing "brain fog". Patient is having esophageal dysphagia to solid foods and pills. Suspect recurrent esophageal stricture. Recently restarted omeprazole, after one tablet he reports waking up in the middle the night difficulty breathing, dry mouth. It is unclear whether or not this was reaction to his medication or coincidental. Had been on omeprazole for years without any problems.  Recommend EGD with dilation in the near future.  I have discussed the  risks, alternatives, benefits with regards to but not limited to the risk of reaction to medication, bleeding, infection, perforation and the patient is agreeable to proceed. Written consent to be obtained.  Start pantoprazole once daily before breakfast. If he has any side effects he will stop medication immediately. He requested we start at lowest dose possible, 20 mg tablets written.  He may be interested in pursuing [place] banding in the future. I have requested last colonoscopy report for review. Discuss at later date.

## 2016-02-08 NOTE — Patient Instructions (Addendum)
1. Start pantoprazole 20 mg once daily before breakfast. Recommend starting in morning as opposed to night to make sure you tolerate it without side effects. If you notice any reactions, stop immediately and let us know.  2. Upper endoscopy with Dr. Oneida Alar. See separate instructions.  3. If you decide you would like hemorrhoid banding, please let us know. I will get copy of old colonoscopy report and let you know if you are up to date.

## 2016-02-08 NOTE — Progress Notes (Signed)
cc'ed to pcp °

## 2016-02-09 ENCOUNTER — Encounter: Payer: Self-pay | Admitting: Gastroenterology

## 2016-02-09 NOTE — Progress Notes (Signed)
Please let patient know that I did review his prior colonoscopy from 2011. No polyps. He had a few diverticula. External hemorrhoids which are not treatable by [place] banding although we are not sure if he's developed any internal hemorrhoids at this point. Proceed with EGD as planned. Next colonoscopy not due until 2021  Please neck for colonoscopy in 2021.

## 2016-02-10 NOTE — Progress Notes (Signed)
LMOM to call.

## 2016-02-13 NOTE — Progress Notes (Signed)
Reminder in epic °

## 2016-02-13 NOTE — Progress Notes (Signed)
Pt returned call and is aware of report and to proceed with EGD. Routing to Manuela Schwartz to nic next Colonoscopy.

## 2016-02-23 DIAGNOSIS — H43393 Other vitreous opacities, bilateral: Secondary | ICD-10-CM | POA: Diagnosis not present

## 2016-03-02 ENCOUNTER — Telehealth: Payer: Self-pay | Admitting: Gastroenterology

## 2016-03-02 ENCOUNTER — Encounter (HOSPITAL_COMMUNITY)
Admission: RE | Disposition: A | Discharge status: Home / Self Care | Payer: Self-pay | Source: Ambulatory Visit | Attending: Gastroenterology

## 2016-03-02 ENCOUNTER — Ambulatory Visit (HOSPITAL_COMMUNITY)
Admission: RE | Admit: 2016-03-02 | Discharge: 2016-03-02 | Disposition: A | Discharge status: Home / Self Care | Payer: Medicare Other | Source: Ambulatory Visit | Attending: Gastroenterology | Admitting: Gastroenterology

## 2016-03-02 ENCOUNTER — Encounter (HOSPITAL_COMMUNITY): Payer: Self-pay | Admitting: *Deleted

## 2016-03-02 DIAGNOSIS — K2970 Gastritis, unspecified, without bleeding: Secondary | ICD-10-CM

## 2016-03-02 DIAGNOSIS — Q394 Esophageal web: Secondary | ICD-10-CM

## 2016-03-02 DIAGNOSIS — Z96641 Presence of right artificial hip joint: Secondary | ICD-10-CM | POA: Insufficient documentation

## 2016-03-02 DIAGNOSIS — M1991 Primary osteoarthritis, unspecified site: Secondary | ICD-10-CM | POA: Insufficient documentation

## 2016-03-02 DIAGNOSIS — I10 Essential (primary) hypertension: Secondary | ICD-10-CM | POA: Diagnosis not present

## 2016-03-02 DIAGNOSIS — K219 Gastro-esophageal reflux disease without esophagitis: Secondary | ICD-10-CM | POA: Insufficient documentation

## 2016-03-02 DIAGNOSIS — Z7982 Long term (current) use of aspirin: Secondary | ICD-10-CM | POA: Insufficient documentation

## 2016-03-02 DIAGNOSIS — I2510 Atherosclerotic heart disease of native coronary artery without angina pectoris: Secondary | ICD-10-CM | POA: Insufficient documentation

## 2016-03-02 DIAGNOSIS — E785 Hyperlipidemia, unspecified: Secondary | ICD-10-CM | POA: Diagnosis not present

## 2016-03-02 DIAGNOSIS — R1319 Other dysphagia: Secondary | ICD-10-CM

## 2016-03-02 DIAGNOSIS — K2950 Unspecified chronic gastritis without bleeding: Secondary | ICD-10-CM | POA: Diagnosis not present

## 2016-03-02 DIAGNOSIS — Z79899 Other long term (current) drug therapy: Secondary | ICD-10-CM | POA: Diagnosis not present

## 2016-03-02 DIAGNOSIS — K222 Esophageal obstruction: Secondary | ICD-10-CM | POA: Diagnosis not present

## 2016-03-02 DIAGNOSIS — R1310 Dysphagia, unspecified: Secondary | ICD-10-CM | POA: Insufficient documentation

## 2016-03-02 DIAGNOSIS — Z955 Presence of coronary angioplasty implant and graft: Secondary | ICD-10-CM | POA: Insufficient documentation

## 2016-03-02 DIAGNOSIS — R1314 Dysphagia, pharyngoesophageal phase: Secondary | ICD-10-CM | POA: Diagnosis not present

## 2016-03-02 DIAGNOSIS — K2980 Duodenitis without bleeding: Secondary | ICD-10-CM | POA: Diagnosis not present

## 2016-03-02 HISTORY — PX: ESOPHAGEAL DILATION: SHX303

## 2016-03-02 HISTORY — PX: ESOPHAGOGASTRODUODENOSCOPY: SHX5428

## 2016-03-02 SURGERY — EGD (ESOPHAGOGASTRODUODENOSCOPY)
Anesthesia: Moderate Sedation

## 2016-03-02 MED ORDER — DEXLANSOPRAZOLE 60 MG PO CPDR: DELAYED_RELEASE_CAPSULE | ORAL | Status: DC

## 2016-03-02 MED ORDER — LIDOCAINE VISCOUS 2 % MT SOLN
OROMUCOSAL | Status: AC
  Filled 2016-03-02: qty 15

## 2016-03-02 MED ORDER — LIDOCAINE VISCOUS 2 % MT SOLN
OROMUCOSAL | Status: DC | PRN
  Administered 2016-03-02: 4 mL via OROMUCOSAL

## 2016-03-02 MED ORDER — STERILE WATER FOR IRRIGATION IR SOLN
Status: DC | PRN
  Administered 2016-03-02: 11:00:00

## 2016-03-02 MED ORDER — MIDAZOLAM HCL 5 MG/5ML IJ SOLN
INTRAMUSCULAR | Status: AC
  Filled 2016-03-02: qty 10

## 2016-03-02 MED ORDER — SODIUM CHLORIDE 0.9 % IV SOLN
INTRAVENOUS | Status: DC
  Administered 2016-03-02: 10:00:00 via INTRAVENOUS

## 2016-03-02 MED ORDER — MINERAL OIL PO OIL
TOPICAL_OIL | ORAL | Status: AC
  Filled 2016-03-02: qty 30

## 2016-03-02 MED ORDER — MIDAZOLAM HCL 5 MG/5ML IJ SOLN
INTRAMUSCULAR | Status: DC | PRN
  Administered 2016-03-02: 2 mg via INTRAVENOUS
  Administered 2016-03-02: 1 mg via INTRAVENOUS
  Administered 2016-03-02: 2 mg via INTRAVENOUS

## 2016-03-02 MED ORDER — MEPERIDINE HCL 100 MG/ML IJ SOLN
INTRAMUSCULAR | Status: AC
  Filled 2016-03-02: qty 2

## 2016-03-02 MED ORDER — MEPERIDINE HCL 100 MG/ML IJ SOLN
INTRAMUSCULAR | Status: DC | PRN
  Administered 2016-03-02 (×3): 25 mg via INTRAVENOUS

## 2016-03-02 NOTE — Progress Notes (Signed)
REVIEWED-NO ADDITIONAL RECOMMENDATIONS. 

## 2016-03-02 NOTE — Discharge Instructions (Signed)
I STRETCHED your esophagus DUE TO A STRICTURE. You have a small hiatal hernia. You have gastritis & DUODENITIS DUE TO ASPIRIN USE. I biopsied your stomach.   START DEXILANT WITH BREAKFAST.  YOUR BIOPSY RESULTS WILL BE AVAILABLE IN MY CHART MAR 8 AND MY OFFICE WILL CONTACT YOU IN 10-14 DAYS WITH YOUR RESULTS.   FOLLOW UP APPT IN 3 MONTHS.    UPPER ENDOSCOPY AFTER CARE Read the instructions outlined below and refer to this sheet in the next week. These discharge instructions provide you with general information on caring for yourself after you leave the hospital. While your treatment has been planned according to the most current medical practices available, unavoidable complications occasionally occur. If you have any problems or questions after discharge, call DR. Newell Wafer, [phone].  ACTIVITY  You may resume your regular activity, but move at a slower pace for the next 24 hours.   Take frequent rest periods for the next 24 hours.   Walking will help get rid of the air and reduce the bloated feeling in your belly (abdomen).   No driving for 24 hours (because of the medicine (anesthesia) used during the test).   You may shower.   Do not sign any important legal documents or operate any machinery for 24 hours (because of the anesthesia used during the test).    NUTRITION  Drink plenty of fluids.   You may resume your normal diet as instructed by your doctor.   Begin with a light meal and progress to your normal diet. Heavy or fried foods are harder to digest and may make you feel sick to your stomach (nauseated).   Avoid alcoholic beverages for 24 hours or as instructed.    MEDICATIONS  You may resume your normal medications.   WHAT YOU CAN EXPECT TODAY  Some feelings of bloating in the abdomen.   Passage of more gas than usual.    IF YOU HAD A BIOPSY TAKEN DURING THE UPPER ENDOSCOPY:  Eat a soft diet IF YOU HAVE NAUSEA, BLOATING, ABDOMINAL PAIN, OR VOMITING.     FINDING OUT THE RESULTS OF YOUR TEST Not all test results are available during your visit. DR. Oneida Alar WILL CALL YOU WITHIN 7 DAYS OF YOUR PROCEDUE WITH YOUR RESULTS. Do not assume everything is normal if you have not heard from DR. Beaumont Austad IN ONE WEEK, CALL HER OFFICE AT [phone].  SEEK IMMEDIATE MEDICAL ATTENTION AND CALL THE OFFICE: [phone] IF:  You have more than a spotting of blood in your stool.   Your belly is swollen (abdominal distention).   You are nauseated or vomiting.   You have a temperature over 101F.   You have abdominal pain or discomfort that is severe or gets worse throughout the day.   Gastritis/DUODENITIS  Gastritis is an inflammation (the body's way of reacting to injury and/or infection) of the stomach. DUODENITIS is an inflammation (the body's way of reacting to injury and/or infection) of the FIRST PART OF THE SMALL INTESTINES. It is often caused by bacterial (germ) infections. It can also be caused BY ASPIRIN, BC/GOODY POWDER'S, (IBUPROFEN) MOTRIN, OR ALEVE (NAPROXEN), chemicals (including alcohol), SPICY FOODS, and medications. This illness may be associated with generalized malaise (feeling tired, not well), UPPER ABDOMINAL STOMACH cramps, and fever. One common bacterial cause of gastritis is an organism known as H. Pylori. This can be treated with antibiotics.   Hiatal Hernia A hiatal hernia occurs when a part of the stomach slides above the diaphragm. The diaphragm  is the thin muscle separating the belly (abdomen) from the chest. A hiatal hernia can be something you are born with or develop over time. Hiatal hernias may allow stomach acid to flow back into your esophagus, the tube which carries food from your mouth to your stomach. If this acid causes problems it is called GERD (gastro-esophageal reflux disease).   SYMPTOMS Common symptoms of GERD are heartburn (burning in your chest). This is worse when lying down or bending over. It may also cause  belching and indigestion. Some of the things which make GERD worse are:  Increased weight pushes on stomach making acid rise more easily.   Smoking markedly increases acid production.   Alcohol decreases lower esophageal sphincter pressure (valve between stomach and esophagus), allowing acid from stomach into esophagus.   Late evening meals and going to bed with a full stomach increases pressure.   HOME CARE INSTRUCTIONS  Try to achieve and maintain an ideal body weight.   Avoid drinking alcoholic beverages.   DO NOT smokE.   Do not wear tight clothing around your chest or stomach.   Eat smaller meals and eat more frequently. This keeps your stomach from getting too full. Eat slowly.   Do not lie down for 2 or 3 hours after eating. Do not eat or drink anything 1 to 2 hours before going to bed.   Avoid caffeine beverages (colas, coffee, cocoa, tea), fatty foods, citrus fruits and all other foods and drinks that contain acid and that seem to increase the problems.   Avoid bending over, especially after eating OR STRAINING. Anything that increases the pressure in your belly increases the amount of acid that may be pushed up into your esophagus.    ESOPHAGEAL STRICTURE  Esophageal strictures can be caused by stomach acid backing up into the tube that carries food from the mouth down to the stomach (lower esophagus).  TREATMENT There are a number of medicines used to treat reflux/stricture, including: Antacids.  ZANTAC Proton-pump inhibitors: DEXILANT  HOME CARE INSTRUCTIONS Eat 2-3 hours before going to bed.  Try to reach and maintain a healthy weight.  Do not eat just a few very large meals. Instead, eat 4 TO 6 smaller meals throughout the day.  Try to identify foods and beverages that make your symptoms worse, and avoid these.  Avoid tight clothing.  Do not exercise right after eating.

## 2016-03-02 NOTE — Op Note (Signed)
[facility] [address] [place], [address]   ENDOSCOPY PROCEDURE REPORT  PATIENT: [name] [name], [name] [name]  MR#: [id number] BIRTHDATE: July 23, 1951 , 43  yrs. old GENDER: male  ENDOSCOPIST: Danie Binder, MD REFFERED AY:[id number] Hilma Favors, M.D.  PROCEDURE DATE:  2016/03/31 PROCEDURE:   EGD with biopsy and EGD with dilatation over guidewire   INDICATIONS:1.  dysphagia.   2.  reflux symptoms ON TUMS/RANITIDINE. CAN'T TOLERATE OMEPRAZOLE OR PROTONIX. MEDICATIONS: Demerol 75 mg IV and Versed 5 mg IV  MD STARTED SEDATION: 1039. PROCEURE COMPLETE: 1105 TOPICAL ANESTHETIC: Viscous Xylocaine  DESCRIPTION OF PROCEDURE:   After the risks benefits and alternatives of the procedure were thoroughly explained, informed consent was obtained.  The [id number]  endoscope was introduced through the mouth and advanced to the second portion of the duodenum. The instrument was slowly withdrawn as the mucosa was carefully examined.  Prior to withdrawal of the scope, the guidwire was placed.  The esophagus was dilated successfully.  The patient was recovered in endoscopy and discharged home in satisfactory condition. Estimated blood loss is zero unless otherwise noted in this procedure report.   ESOPHAGUS: A moderately severe Schatzki ring was found at the gastroesophageal junction.   STOMACH: Mild non-erosive gastritis (inflammation) was found in the gastric antrum.  Multiple biopsies were performed using cold forceps.   DUODENUM: Mild duodenal inflammation was found in the duodenal bulb.   The duodenal mucosa showed no abnormalities in the 2nd part of the duodenum.   Dilation was then performed at the gastroesphageal junction Dilator: Savary over guidewire Size(s): 14-17 MM Resistance: minimal Heme: yes TRACE  COMPLICATIONS: There were no immediate complications.  ENDOSCOPIC IMPRESSION: 1.   Schatzki ring at the gastroesophageal junction 2.   MILD Non-erosive gastritis AND  DUODENITIS  RECOMMENDATIONS: START DEXILANT WITH BREAKFAST. AWAIT BIOPSY RESULTS. FOLLOW UP APPT IN 3 MONTHS.     _______________________________ Lorrin MaisDanie Binder, MD Mar 31, 2016 12:52 PM   CPT CODES: ICD CODES:  The ICD and CPT codes recommended by this software are interpretations from the data that the clinical staff has captured with the software.  The verification of the translation of this report to the ICD and CPT codes and modifiers is the sole responsibility of the health care institution and practicing physician where this report was generated.  [employer] will not be held responsible for the validity of the ICD and CPT codes included on this report.  AMA assumes no liability for data contained or not contained herein. CPT is a Designer, television/film set of the [employer].

## 2016-03-02 NOTE — H&P (Signed)
Primary Care Physician:  Purvis Kilts, MD Primary Gastroenterologist:  Dr. Oneida Alar  Pre-Procedure History & Physical: HPI:  [name] [name] is a 65 y.o. male here for DYSPHAGIA.  Past Medical History  Diagnosis Date  . HTN (hypertension)   . Hyperlipidemia   . Avascular necrosis of bone of hip ([place])     right  . Anxiety   . Paresthesias   . GERD (gastroesophageal reflux disease)   . Myocardial infarction ([place])   . CAD (coronary artery disease)     stents x3 2008  . Arthritis     back pain    Past Surgical History  Procedure Laterality Date  . Tonsillectomy  age 69  . Total hip arthroplasty  age 54    right hip for avascular necrosis of hip and spine  . Colonoscopy  2011 NUR [facility] SCREENING d50 v5    MILD [place] TICS, ? PREP ARTIFACT V. PROCTITIS-rX:[place]. next TCS 2021.  . Cardiac catheterization      3 stents placed  . Esophagogastroduodenoscopy  10/03/11    small hiatal hernia/gastric ulcers/stricutre in distal esophagus  . Mass excision Left 09/30/2014    Procedure: EXCISION MASS LEFT WRIST ;  Surgeon: Daryll Brod, MD;  Location: [place];  Service: Orthopedics;  Laterality: Left;    Prior to Admission medications   Medication Sig Start Date End Date Taking? Authorizing Provider  aspirin EC 81 MG tablet Take 81 mg by mouth daily.   Yes Historical Provider, MD  Astaxanthin 4 MG CAPS Take 4 mg by mouth every other day.    Yes Historical Provider, MD  calcium carbonate (TUMS EX) 750 MG chewable tablet Chew 1 tablet by mouth 3 (three) times daily.   Yes Historical Provider, MD  Coenzyme Q10 200 MG capsule Take 200 mg by mouth daily.   Yes Historical Provider, MD  ezetimibe (ZETIA) 10 MG tablet Take 1 tablet (10 mg total) by mouth daily. 01/30/16  Yes Fay Records, MD  levothyroxine (SYNTHROID, LEVOTHROID) 50 MCG tablet Take 50 mcg by mouth daily.  12/21/14  Yes Historical Provider, MD  losartan-hydrochlorothiazide (HYZAAR) 100-25 MG tablet Take 0.5 tablets  by mouth 2 (two) times daily. 01/30/16  Yes Fay Records, MD  Magnesium 100 MG CAPS Take 200 mg by mouth daily.   Yes Historical Provider, MD  pravastatin (PRAVACHOL) 40 MG tablet TAKE ONE TABLET BY MOUTH ONCE DAILY IN THE EVENING 01/30/16  Yes Fay Records, MD  nitroGLYCERIN (NITROSTAT) 0.4 MG SL tablet Place 1 tablet (0.4 mg total) under the tongue every 5 (five) minutes as needed. For chest pains 12/29/12   Fay Records, MD  pantoprazole (PROTONIX) 20 MG tablet Take 1 tablet (20 mg total) by mouth daily. Patient not taking: Reported on 02/20/2016 02/08/16   Mahala Menghini, PA-C    Allergies as of 02/08/2016 - Review Complete 02/08/2016  Allergen Reaction Noted  . Bee venom Hives and Swelling 09/24/2011  . Clopidogrel bisulfate Hives 02/20/2010  . Crestor [rosuvastatin]  08/17/2014  . Lipitor [atorvastatin]  08/17/2014  . Penicillins  02/20/2010    Family History  Problem Relation Age of Onset  . Dementia Mother   . Prostate cancer Father 25  . Lung cancer Sister 42  . Colon cancer Neg Hx   . Colon polyps Neg Hx   . Anesthesia problems Neg Hx   . Hypotension Neg Hx   . Malignant hyperthermia Neg Hx   . Pseudochol deficiency Neg Hx  Social History   Social History  . Marital Status: Single    Spouse Name: N/A  . Number of Children: N/A  . Years of Education: N/A   Occupational History  . Not on file.   Social History Main Topics  . Smoking status: Former Smoker -- 1.00 packs/day for 30 years    Types: Cigarettes    Quit date: 09/30/2007  . Smokeless tobacco: Never Used  . Alcohol Use: No  . Drug Use: No  . Sexual Activity: Not on file   Other Topics Concern  . Not on file   Social History Narrative    Review of Systems: See HPI, otherwise negative ROS   Physical Exam: BP 130/82 mmHg  Pulse 66  Temp(Src) 97.7 F (36.5 C) (Oral)  Resp 18  Ht 6' (1.829 m)  Wt 178 lb (80.74 kg)  BMI 24.14 kg/m2  SpO2 100% General:   Alert,  pleasant and cooperative in  NAD Head:  Normocephalic and atraumatic. Neck:  Supple; Lungs:  Clear throughout to auscultation.    Heart:  Regular rate and rhythm. Abdomen:  Soft, nontender and nondistended. Normal bowel sounds, without guarding, and without rebound.   Neurologic:  Alert and  oriented x4;  grossly normal neurologically.  Impression/Plan:     DYSPHAGIA  PLAN:  EGD/DIL TODAY

## 2016-03-02 NOTE — Telephone Encounter (Signed)
Pt had procedure on Friday by SF and needed to stop by office to get samples of Dexilant. The office would be closed before patient could get here on Friday and will stop by on Monday.

## 2016-03-05 NOTE — Telephone Encounter (Signed)
Pt me by the office to pick up the samples. He asked if he should take 1/2 of the capsule in something to try first since he has problems with different meds. I told him that I would send Dr. Oneida Alar a message and see what she recommends, and then he just said that he will try it and let us know if he has problems.

## 2016-03-05 NOTE — Telephone Encounter (Signed)
PLEASE CALL PT. YOU CANNOT TAKE HALF OF THE [place].IT IS A CAPSULE.

## 2016-03-05 NOTE — Telephone Encounter (Signed)
REVIEWED-NO ADDITIONAL RECOMMENDATIONS. 

## 2016-03-05 NOTE — Telephone Encounter (Signed)
Dexilant 60 mg tablets #15 at front for pick up,

## 2016-03-05 NOTE — Telephone Encounter (Signed)
Pt is aware.  

## 2016-03-06 ENCOUNTER — Encounter (HOSPITAL_COMMUNITY): Payer: Self-pay | Admitting: Gastroenterology

## 2016-04-05 ENCOUNTER — Telehealth: Payer: Self-pay | Admitting: Gastroenterology

## 2016-04-05 ENCOUNTER — Telehealth: Payer: Self-pay

## 2016-04-05 NOTE — Telephone Encounter (Signed)
Pt asked that I call [facility] mailorder and change his rx to 90 day supply. He pays the same price for #30 as he does #90. I called the mail order and I have changed his rx to #90 with 3 rf per [facility] rx.  I spoke with Ander Purpura, pharmacist at [facility].

## 2016-04-05 NOTE — Telephone Encounter (Signed)
PA has been done and sent to the insurance co. I tried to call pt- NA- LMOM informing him that I have the PA request and have sent it in.

## 2016-04-05 NOTE — Telephone Encounter (Signed)
He said that they are sending out some to him today not sure if [facility] sent it in be he is okay for now

## 2016-04-05 NOTE — Telephone Encounter (Signed)
noted 

## 2016-04-05 NOTE — Telephone Encounter (Signed)
Is he in need of any medication RX right now?

## 2016-04-05 NOTE — Telephone Encounter (Signed)
Spoke with the pt, informed him that PA has been completed and sent to the insurance

## 2016-04-05 NOTE — Telephone Encounter (Signed)
Please call patient regarding his PA on his Dexilant. [id number].

## 2016-04-05 NOTE — Telephone Encounter (Signed)
Pt would always like to have a 90 days supply of medication sent into the mail order pharmacy.

## 2016-04-06 NOTE — Telephone Encounter (Signed)
Noted, thanks!

## 2016-04-10 NOTE — Telephone Encounter (Signed)
PA for dexilant was approved. Faxed approval to mail order

## 2016-04-11 MED ORDER — DEXLANSOPRAZOLE 60 MG PO CPDR: DELAYED_RELEASE_CAPSULE | ORAL | Status: DC

## 2016-04-11 NOTE — Addendum Note (Signed)
Addended by: Danie Binder on: 04/11/2016 10:50 PM   Modules accepted: Orders

## 2016-04-11 NOTE — Telephone Encounter (Signed)
REVIEWED-NO ADDITIONAL RECOMMENDATIONS. 

## 2016-04-11 NOTE — Telephone Encounter (Signed)
PLEASE CALL PT. Rx sent FOR 90 DAYS SUPPLY DEXILANT.

## 2016-04-12 ENCOUNTER — Telehealth: Payer: Self-pay | Admitting: Gastroenterology

## 2016-04-12 NOTE — Telephone Encounter (Signed)
REVIEWED. AGREE. NO ADDITIONAL RECOMMENDATIONS. 

## 2016-04-12 NOTE — Telephone Encounter (Signed)
PT is aware.

## 2016-04-12 NOTE — Telephone Encounter (Signed)
OV made °

## 2016-04-12 NOTE — Telephone Encounter (Signed)
PT asked if Dexilant can cause insomnia. He has been on it about 3 weeks and he has experienced some insomnia. I told him I am not aware of that being a side effect. I told him he could talk to the pharmacist and also if he keeps having problems to give [place] a call.

## 2016-04-12 NOTE — Telephone Encounter (Signed)
Please call pt. His stomach Bx shows gastritis.    CONTINUE DEXILANT WITH BREAKFAST.  FOLLOW UP APPT IN 3 MONTHS E30 DYSPHAGIA/HEMORRHOIDS.

## 2016-04-12 NOTE — Telephone Encounter (Signed)
Pt is aware.  

## 2016-05-29 DIAGNOSIS — M4004 Postural kyphosis, thoracic region: Secondary | ICD-10-CM | POA: Diagnosis not present

## 2016-05-29 DIAGNOSIS — M9903 Segmental and somatic dysfunction of lumbar region: Secondary | ICD-10-CM | POA: Diagnosis not present

## 2016-05-29 DIAGNOSIS — M9901 Segmental and somatic dysfunction of cervical region: Secondary | ICD-10-CM | POA: Diagnosis not present

## 2016-05-29 DIAGNOSIS — M9902 Segmental and somatic dysfunction of thoracic region: Secondary | ICD-10-CM | POA: Diagnosis not present

## 2016-05-29 DIAGNOSIS — S338XXA Sprain of other parts of lumbar spine and pelvis, initial encounter: Secondary | ICD-10-CM | POA: Diagnosis not present

## 2016-05-29 DIAGNOSIS — S134XXA Sprain of ligaments of cervical spine, initial encounter: Secondary | ICD-10-CM | POA: Diagnosis not present

## 2016-05-30 ENCOUNTER — Encounter: Payer: Self-pay | Admitting: General Practice

## 2016-06-04 ENCOUNTER — Encounter: Payer: Self-pay | Admitting: Gastroenterology

## 2016-06-04 ENCOUNTER — Ambulatory Visit (INDEPENDENT_AMBULATORY_CARE_PROVIDER_SITE_OTHER): Payer: Medicare Other | Admitting: Gastroenterology

## 2016-06-04 VITALS — BP 116/72 | HR 75 | Temp 97.1°F | Ht 72.0 in | Wt 178.6 lb

## 2016-06-04 DIAGNOSIS — I2510 Atherosclerotic heart disease of native coronary artery without angina pectoris: Secondary | ICD-10-CM

## 2016-06-04 DIAGNOSIS — K219 Gastro-esophageal reflux disease without esophagitis: Secondary | ICD-10-CM

## 2016-06-04 MED ORDER — FAMOTIDINE 20 MG PO TABS: 20.0000 mg | ORAL_TABLET | Freq: Two times a day (BID) | ORAL | Status: DC

## 2016-06-04 NOTE — Patient Instructions (Signed)
Continue Famotidine BID.   Please let me know if you have any recurrent problems swallowing or other issues.  We will see you in 1 year!

## 2016-06-04 NOTE — Progress Notes (Signed)
Referring Provider: Sharilyn Sites, MD Primary Care Physician:  Purvis Kilts, MD  Primary GI: Dr. Oneida Alar   Chief Complaint  Patient presents with  . Follow-up    HPI:   [name] [name] is a 65 y.o. male presenting today in follow-up for dysphagia, s/p EGD with dilation. Gastritis/duodenitis noted on EGD.    Dexilant has helped with reflux. Started hurting in his joints and feeling weak. Had no energy to put one foot in front of another. Had to stop this medication. Went to [employer] and bought Pepcid. Taking 20 mg BID, which seems to be working well. Soreness in joints hasn't gone away.   Omeprazole: would wake up with his heart running away in his chest. Protonix: feels like it caused the shakes. Trying to eat healthy. Very rarely will eat [place] foods. Behavior modification as well. Dysphagia resolved.    Occasionally has issues with an external hemorrhoid. Declining rectal exam. No rectal bleeding.   Past Medical History  Diagnosis Date  . HTN (hypertension)   . Hyperlipidemia   . Avascular necrosis of bone of hip ([place])     right  . Anxiety   . Paresthesias   . GERD (gastroesophageal reflux disease)   . Myocardial infarction ([place])   . CAD (coronary artery disease)     stents x3 2008  . Arthritis     back pain    Past Surgical History  Procedure Laterality Date  . Tonsillectomy  age 55  . Total hip arthroplasty  age 87    right hip for avascular necrosis of hip and spine  . Colonoscopy  2011 NUR [facility] SCREENING d50 v5    MILD [place] TICS, ? PREP ARTIFACT V. PROCTITIS-rX:[place]. next TCS 2021.  . Cardiac catheterization      3 stents placed  . Esophagogastroduodenoscopy  10/03/11    small hiatal hernia/gastric ulcers/stricutre in distal esophagus  . Mass excision Left 09/30/2014    Procedure: EXCISION MASS LEFT WRIST ;  Surgeon: Daryll Brod, MD;  Location: [facility];  Service: Orthopedics;  Laterality: Left;  . Esophagogastroduodenoscopy N/A  03/02/2016    Dr. Oneida Alar: Schatzki's ring at GE junction s/p dilation, mild non-erosive gastritis/duodenitis   . Esophageal dilation N/A 03/02/2016    Procedure: ESOPHAGEAL DILATION;  Surgeon: Danie Binder, MD;  [facility];  Service: Endoscopy;  Laterality: N/A;    Current Outpatient Prescriptions  Medication Sig Dispense Refill  . aspirin EC 81 MG tablet Take 81 mg by mouth daily.    . calcium carbonate (TUMS EX) 750 MG chewable tablet Chew 1 tablet by mouth 3 (three) times daily.    . Coenzyme Q10 200 MG capsule Take 200 mg by mouth daily.    Marland Kitchen ezetimibe (ZETIA) 10 MG tablet Take 1 tablet (10 mg total) by mouth daily. 90 tablet 3  . famotidine (PEPCID) 20 MG tablet Take 1 tablet (20 mg total) by mouth 2 (two) times daily. 180 tablet 3  . levothyroxine (SYNTHROID, LEVOTHROID) 50 MCG tablet Take 50 mcg by mouth daily.     Marland Kitchen losartan-hydrochlorothiazide (HYZAAR) 100-25 MG tablet Take 0.5 tablets by mouth 2 (two) times daily. 90 tablet 3  . nitroGLYCERIN (NITROSTAT) 0.4 MG SL tablet Place 1 tablet (0.4 mg total) under the tongue every 5 (five) minutes as needed. For chest pains 25 tablet 3  . pravastatin (PRAVACHOL) 40 MG tablet TAKE ONE TABLET BY MOUTH ONCE DAILY IN THE EVENING 90 tablet  3  . Astaxanthin 4 MG CAPS Take 4 mg by mouth every other day. Reported on 06/04/2016    . dexlansoprazole (DEXILANT) 60 MG capsule 1 PO WITH BREAKFAST. (Patient not taking: Reported on 06/04/2016) 90 capsule 3  . Magnesium 100 MG CAPS Take 200 mg by mouth daily. Reported on 06/04/2016     No current facility-administered medications for this visit.    Allergies as of 06/04/2016 - Review Complete 06/04/2016  Allergen Reaction Noted  . Bee venom Hives and Swelling 09/24/2011  . Clopidogrel bisulfate Hives 02/20/2010  . Crestor [rosuvastatin]  08/17/2014  . Lipitor [atorvastatin]  08/17/2014  . Penicillins  02/20/2010    Family History  Problem Relation Age of Onset  . Dementia Mother   .  Prostate cancer Father 81  . Lung cancer Sister 46  . Colon cancer Neg Hx   . Colon polyps Neg Hx   . Anesthesia problems Neg Hx   . Hypotension Neg Hx   . Malignant hyperthermia Neg Hx   . Pseudochol deficiency Neg Hx     Social History   Social History  . Marital Status: Single    Spouse Name: N/A  . Number of Children: N/A  . Years of Education: N/A   Social History Main Topics  . Smoking status: Former Smoker -- 1.00 packs/day for 30 years    Types: Cigarettes    Quit date: 09/30/2007  . Smokeless tobacco: Never Used  . Alcohol Use: No  . Drug Use: No  . Sexual Activity: Not Asked   Other Topics Concern  . None   Social History Narrative    Review of Systems: As mentioned in HPI.   Physical Exam: BP 116/72 mmHg  Pulse 75  Temp(Src) 97.1 F (36.2 C)  Ht 6' (1.829 m)  Wt 178 lb 9.6 oz (81.012 kg)  BMI 24.22 kg/m2 General:   Alert and oriented. No distress noted. Pleasant and cooperative.  Head:  Normocephalic and atraumatic. Eyes:  Conjuctiva clear without scleral icterus. Abdomen:  +BS, soft, non-tender and non-distended. No rebound or guarding. No HSM or masses noted. Questionable superficial lipomas  Msk:  Symmetrical without gross deformities. Normal posture. Extremities:  Without edema. Neurologic:  Alert and  oriented x4;  grossly normal neurologically. Psych:  Alert and cooperative. Normal mood and affect.

## 2016-06-05 DIAGNOSIS — M4004 Postural kyphosis, thoracic region: Secondary | ICD-10-CM | POA: Diagnosis not present

## 2016-06-05 DIAGNOSIS — S338XXA Sprain of other parts of lumbar spine and pelvis, initial encounter: Secondary | ICD-10-CM | POA: Diagnosis not present

## 2016-06-05 DIAGNOSIS — M9902 Segmental and somatic dysfunction of thoracic region: Secondary | ICD-10-CM | POA: Diagnosis not present

## 2016-06-05 DIAGNOSIS — S134XXA Sprain of ligaments of cervical spine, initial encounter: Secondary | ICD-10-CM | POA: Diagnosis not present

## 2016-06-05 DIAGNOSIS — M9903 Segmental and somatic dysfunction of lumbar region: Secondary | ICD-10-CM | POA: Diagnosis not present

## 2016-06-05 DIAGNOSIS — M9901 Segmental and somatic dysfunction of cervical region: Secondary | ICD-10-CM | POA: Diagnosis not present

## 2016-06-06 ENCOUNTER — Encounter: Payer: Self-pay | Admitting: Gastroenterology

## 2016-06-06 NOTE — Progress Notes (Signed)
cc'ed to pcp °

## 2016-06-06 NOTE — Assessment & Plan Note (Signed)
Unable to tolerate multiple PPIs but doing well on Pepcid. Dysphagia resolved. On physical exam, superficial cystic-like small areas across abdomen likely consistent with lipomas: patient notes he has had large ones removed from abdomen in the past. Declining any further evaluation of this. Return in 1 year or sooner if needed.

## 2016-06-12 DIAGNOSIS — L918 Other hypertrophic disorders of the skin: Secondary | ICD-10-CM | POA: Diagnosis not present

## 2016-06-12 DIAGNOSIS — M9901 Segmental and somatic dysfunction of cervical region: Secondary | ICD-10-CM | POA: Diagnosis not present

## 2016-06-12 DIAGNOSIS — S134XXA Sprain of ligaments of cervical spine, initial encounter: Secondary | ICD-10-CM | POA: Diagnosis not present

## 2016-06-12 DIAGNOSIS — M9903 Segmental and somatic dysfunction of lumbar region: Secondary | ICD-10-CM | POA: Diagnosis not present

## 2016-06-12 DIAGNOSIS — M9902 Segmental and somatic dysfunction of thoracic region: Secondary | ICD-10-CM | POA: Diagnosis not present

## 2016-06-12 DIAGNOSIS — S338XXA Sprain of other parts of lumbar spine and pelvis, initial encounter: Secondary | ICD-10-CM | POA: Diagnosis not present

## 2016-06-12 DIAGNOSIS — M4004 Postural kyphosis, thoracic region: Secondary | ICD-10-CM | POA: Diagnosis not present

## 2016-06-13 DIAGNOSIS — M25312 Other instability, left shoulder: Secondary | ICD-10-CM | POA: Diagnosis not present

## 2016-06-13 DIAGNOSIS — G8929 Other chronic pain: Secondary | ICD-10-CM | POA: Diagnosis not present

## 2016-06-13 DIAGNOSIS — M542 Cervicalgia: Secondary | ICD-10-CM | POA: Diagnosis not present

## 2016-06-13 DIAGNOSIS — S4982XA Other specified injuries of left shoulder and upper arm, initial encounter: Secondary | ICD-10-CM | POA: Diagnosis not present

## 2016-06-13 DIAGNOSIS — M25512 Pain in left shoulder: Secondary | ICD-10-CM | POA: Diagnosis not present

## 2016-06-18 ENCOUNTER — Encounter (HOSPITAL_COMMUNITY): Payer: Self-pay

## 2016-06-18 ENCOUNTER — Ambulatory Visit (HOSPITAL_COMMUNITY): Payer: Medicare Other | Attending: Sports Medicine

## 2016-06-18 DIAGNOSIS — R29898 Other symptoms and signs involving the musculoskeletal system: Secondary | ICD-10-CM | POA: Insufficient documentation

## 2016-06-18 DIAGNOSIS — M25612 Stiffness of left shoulder, not elsewhere classified: Secondary | ICD-10-CM | POA: Diagnosis not present

## 2016-06-18 DIAGNOSIS — M25512 Pain in left shoulder: Secondary | ICD-10-CM | POA: Insufficient documentation

## 2016-06-18 NOTE — Therapy (Signed)
[facility] [place], [place], [address] Phone: [phone]   Fax:  [phone]  Occupational Therapy Evaluation  Patient Details  Name: [name] [name] MRN: [id number] Date of Birth: 1951-04-08 Referring Provider: Dr. Verda Cumins  Encounter Date: 06/18/2016      OT End of Session - 06/18/16 1252    Visit Number 1   Number of Visits 9   Date for OT Re-Evaluation 07/18/16   Authorization Type 1) medicare part A 2) [place] Time Period before 10th visit   Authorization - Visit Number 1   Authorization - Number of Visits 10   OT Start Time 1120   OT Stop Time 1200   OT Time Calculation (min) 40 min   Activity Tolerance Patient tolerated treatment well   Behavior During Therapy [facility] for tasks assessed/performed      Past Medical History  Diagnosis Date  . HTN (hypertension)   . Hyperlipidemia   . Avascular necrosis of bone of hip ([place])     right  . Anxiety   . Paresthesias   . GERD (gastroesophageal reflux disease)   . Myocardial infarction ([place])   . CAD (coronary artery disease)     stents x3 2008  . Arthritis     back pain    Past Surgical History  Procedure Laterality Date  . Tonsillectomy  age 72  . Total hip arthroplasty  age 16    right hip for avascular necrosis of hip and spine  . Colonoscopy  2011 NUR [facility] SCREENING d50 v5    MILD [place] TICS, ? PREP ARTIFACT V. PROCTITIS-rX:[place]. next TCS 2021.  . Cardiac catheterization      3 stents placed  . Esophagogastroduodenoscopy  10/03/11    small hiatal hernia/gastric ulcers/stricutre in distal esophagus  . Mass excision Left 09/30/2014    Procedure: EXCISION MASS LEFT WRIST ;  Surgeon: Daryll Brod, MD;  Location: [facility];  Service: Orthopedics;  Laterality: Left;  . Esophagogastroduodenoscopy N/A 03/02/2016    Dr. Oneida Alar: Schatzki's ring at GE junction s/p dilation, mild non-erosive gastritis/duodenitis   . Esophageal  dilation N/A 03/02/2016    Procedure: ESOPHAGEAL DILATION;  Surgeon: Danie Binder, MD;  [facility];  Service: Endoscopy;  Laterality: N/A;    There were no vitals filed for this visit.      Subjective Assessment - 06/18/16 1238    Subjective  S: If I don't do anything with it I'm ok.    Pertinent History Patient is a 65 y/o male S/P right rotator cuff syndrome which occured 3 weeks ago when he was required to grab his small dog quickly during a walk to prevent him from being attacked. Pt reports that Dr. Drema Dallas states that he has a partial RTC tear. Dr. Drema Dallas has referred patient to occupational therapy for evaluation and treatment.    Special Tests FOTO score: 45/100   Patient Stated Goals To heal his shoulder.   Currently in Pain? Yes  Pt reports a 6/10 pain level at worse.   Pain Score 2    Pain Location Shoulder   Pain Orientation Left   Pain Descriptors / Indicators Aching;Burning   Pain Type Acute pain   Pain Radiating Towards N/A   Pain Onset 1 to 4 weeks ago   Pain Frequency Occasional   Aggravating Factors  Movement and use   Pain Relieving Factors Rest   Effect of Pain on Daily  Activities None   Multiple Pain Sites No           [facility] OT Assessment - 06/18/16 1118    Assessment   Diagnosis Left rotator cuff syndrome   Referring Provider Dr. Verda Cumins   Onset Date --  3 weeks ago   Assessment 07/12/2016   Prior Therapy None   Precautions   Precautions None   Restrictions   Weight Bearing Restrictions No   Balance Screen   Has the patient fallen in the past 6 months No   Home  Environment   Family/patient expects to be discharged to: Private residence   [place] Retired   ADL   ADL comments Pt reports difficulty lifting away from his body especially with reaching out to the side.   Mobility   Mobility Status Independent   Written Expression   Dominant Hand  Right   Vision - History   Baseline Vision No visual deficits   Cognition   Overall Cognitive Status Within Functional Limits for tasks assessed   ROM / Strength   AROM / PROM / Strength AROM;PROM;Strength   Palpation   Palpation comment Min fascial restrictions in left upper arm, trapezius, and scapularis region.    AROM   Overall AROM Comments Assessed seated. IR/er adducted.   AROM Assessment Site Shoulder   Right/Left Shoulder Left   Left Shoulder Flexion 60 Degrees   Left Shoulder ABduction 95 Degrees   Left Shoulder Internal Rotation 90 Degrees   Left Shoulder External Rotation 71 Degrees   PROM   Overall PROM Comments Assessed supine. IR/er adducted.   PROM Assessment Site Shoulder   Right/Left Shoulder Left   Left Shoulder Flexion 165 Degrees   Left Shoulder ABduction 180 Degrees   Left Shoulder Internal Rotation 90 Degrees   Left Shoulder External Rotation 90 Degrees   Strength   Overall Strength Comments Assessed seated. IR/er adducted.   Strength Assessment Site Shoulder   Right/Left Shoulder Left   Left Shoulder Flexion 3-/5   Left Shoulder ABduction 3/5   Left Shoulder Internal Rotation 3/5   Left Shoulder External Rotation 3/5                         OT Education - 06/18/16 1251    Education provided Yes   Education Details shoulder stretches   Person(s) Educated Patient   Methods Explanation;Demonstration;Verbal cues;Handout   Comprehension Returned demonstration;Verbalized understanding          OT Short Term Goals - 06/18/16 1345    OT SHORT TERM GOAL #1   Title Patient will be educated and independent with HEP to increase functional performance during daily tasks using LUE.    Time 4   Period Weeks   Status New   OT SHORT TERM GOAL #2   Title Patient will return to highest level of independence with all daily tasks using LUE.    Time 4   Period Weeks   Status New   OT SHORT TERM GOAL #3   Title Patient will decrease fascial  restrictions to zero amount in LUE to increase functional mobility and comfort during use.    Time 4   Period Weeks   Status New   OT SHORT TERM GOAL #4   Title Patient will decrease pain level to 2/10 or less during daily tasks.    Time 4  Period Weeks   Status New   OT SHORT TERM GOAL #5   Title Patient will increase LUE strength to 3+/5 to increase ability to return to normal lightwieight lifting tasks at home.    Time 4   Period Weeks   Status New   Additional Short Term Goals   Additional Short Term Goals Yes   OT SHORT TERM GOAL #6   Title Patient will increase A/ROM to [facility] to increase ability to complete activities at shoulder level or better.    Time 4   Period Weeks   Status New                  Plan - 06/18/16 1253    Clinical Impression Statement A: Patient is a 66 y/o male S/P left RTC syndrome causing increased pain and fascial restrictions and decreased strength and ROM resulting in difficulty completing daily tasks.    Rehab Potential Excellent   OT Frequency 2x / week   OT Duration 4 weeks   OT Treatment/Interventions Self-care/ADL training;Ultrasound;DME and/or AE instruction;Passive range of motion;Patient/family education;Cryotherapy;Electrical Stimulation;Moist Heat;Therapeutic activities;Therapeutic exercises;Manual Therapy   Plan P: Pt will benefit from skilled OT services to increase functional performance during daily tasks using LUE. Treatment Plan: Myofascial restrictions, manual stretching, AA/ROM, A/ROM, general scapular and shoulder strengthening.   Consulted and Agree with Plan of Care Patient      Patient will benefit from skilled therapeutic intervention in order to improve the following deficits and impairments:  Decreased strength, Pain, Increased fascial restricitons, Decreased range of motion  Visit Diagnosis: Other symptoms and signs involving the musculoskeletal system - Plan: Ot plan of care cert/re-cert  Pain in left shoulder  - Plan: Ot plan of care cert/re-cert  Stiffness of left shoulder, not elsewhere classified - Plan: Ot plan of care cert/re-cert      G-Codes - 0000000 [id number]    Functional Assessment Tool Used FOTO score: 45/100 (55% impaired)   Functional Limitation Carrying, moving and handling objects   Carrying, Moving and Handling Objects Current Status [id number]) At least 40 percent but less than 60 percent impaired, limited or restricted   Carrying, Moving and Handling Objects Goal Status [id number]) At least 20 percent but less than 40 percent impaired, limited or restricted      Problem List Patient Active Problem List   Diagnosis Date Noted  . Esophageal dysphagia 02/08/2016  . Hemorrhoids 02/08/2016  . Chronic radicular cervical pain 07/29/2014  . GERD (gastroesophageal reflux disease) 10/14/2012  . Constipation 10/14/2012  . Stiffness of joint, not elsewhere classified, other specified site 02/21/2012  . PUD (peptic ulcer disease) 12/21/2011  . Dysphagia 07/25/2011  . Hyperlipidemia 08/12/2009  . ESSENTIAL HYPERTENSION, BENIGN 08/12/2009  . CAD, NATIVE VESSEL 08/12/2009  . CUTANEOUS ERUPTIONS, DRUG-INDUCED 01/09/2008  . LOCALIZED SUPERFICIAL SWELLING MASS OR LUMP 01/09/2008    Ailene Ravel, OTR/L,CBIS  [phone]  06/18/2016, 1:50 PM  [facility] [address] [place], [place], [address] Phone: [phone]   Fax:  [phone]  Name: XANDER JIRAK MRN: [id number] Date of Birth: [birth year]

## 2016-06-18 NOTE — Patient Instructions (Signed)
Doorway Stretch  Place each hand opposite each other on the doorway. (You can change where you feel the stretch by moving arms higher or lower.) Step through with one foot and bend front knee until a stretch is felt and hold. Step through with the opposite foot on the next rep. Hold for ___10__ seconds. Repeat _2___times.     Scapular Retraction (Standing)   With arms at sides, pinch shoulder blades together. Repeat _10___ times per set. Do _1___ sets per session. Do ____ sessions per day.   http://orth.exer.us/944   Copyright  VHI. All rights reserved.   Internal Rotation Across Back  Grab the end of a towel with your affected side, palm facing backwards. Grab the towel with your unaffected side and pull your affected hand across your back until you feel a stretch in the front of your shoulder. If you feel pain, pull just to the pain, do not pull through the pain. Hold. Return your affected arm to your side. Try to keep your hand/arm close to your body during the entire movement.    Hold 10 seconds. Complete twice.        Posterior Capsule Stretch   Stand or sit, one arm across body so hand rests over opposite shoulder. Gently push on crossed elbow with other hand until stretch is felt in shoulder of crossed arm. Hold _10__ seconds.  Repeat _2__ times per session. Do ___ sessions per day.   Wall Flexion  Using a towel, slide your arm up the wall until a stretch is felt in your shoulder . Hold 10 seconds. Repeat 2 times.

## 2016-06-25 ENCOUNTER — Ambulatory Visit (HOSPITAL_COMMUNITY): Payer: Medicare Other

## 2016-06-25 ENCOUNTER — Encounter (HOSPITAL_COMMUNITY): Payer: Self-pay

## 2016-06-25 DIAGNOSIS — R29898 Other symptoms and signs involving the musculoskeletal system: Secondary | ICD-10-CM | POA: Diagnosis not present

## 2016-06-25 DIAGNOSIS — M25612 Stiffness of left shoulder, not elsewhere classified: Secondary | ICD-10-CM

## 2016-06-25 DIAGNOSIS — Z125 Encounter for screening for malignant neoplasm of prostate: Secondary | ICD-10-CM | POA: Diagnosis not present

## 2016-06-25 DIAGNOSIS — Z1389 Encounter for screening for other disorder: Secondary | ICD-10-CM | POA: Diagnosis not present

## 2016-06-25 DIAGNOSIS — E782 Mixed hyperlipidemia: Secondary | ICD-10-CM | POA: Diagnosis not present

## 2016-06-25 DIAGNOSIS — M25512 Pain in left shoulder: Secondary | ICD-10-CM | POA: Diagnosis not present

## 2016-06-25 DIAGNOSIS — Z6824 Body mass index (BMI) 24.0-24.9, adult: Secondary | ICD-10-CM | POA: Diagnosis not present

## 2016-06-25 DIAGNOSIS — I10 Essential (primary) hypertension: Secondary | ICD-10-CM | POA: Diagnosis not present

## 2016-06-25 DIAGNOSIS — R7309 Other abnormal glucose: Secondary | ICD-10-CM | POA: Diagnosis not present

## 2016-06-25 NOTE — Therapy (Signed)
[place], [place], [address] Phone: [phone]   Fax:  [phone]  Occupational Therapy Treatment  Patient Details  Name: [name] [name] MRN: [id number] Date of Birth: 03/07/1951 Referring Provider: Dr. Verda Cumins  Encounter Date: 06/25/2016      OT End of Session - 06/25/16 1606    Visit Number 2   Number of Visits 9   Date for OT Re-Evaluation 07/18/16   Authorization Type 1) medicare part A 2) [place] Time Period before 10th visit   Authorization - Visit Number 2   Authorization - Number of Visits 10   OT Start Time 1520   OT Stop Time 1603   OT Time Calculation (min) 43 min   Activity Tolerance Patient tolerated treatment well   Behavior During Therapy [facility] for tasks assessed/performed      Past Medical History  Diagnosis Date  . HTN (hypertension)   . Hyperlipidemia   . Avascular necrosis of bone of hip ([place])     right  . Anxiety   . Paresthesias   . GERD (gastroesophageal reflux disease)   . Myocardial infarction ([place])   . CAD (coronary artery disease)     stents x3 2008  . Arthritis     back pain    Past Surgical History  Procedure Laterality Date  . Tonsillectomy  age 45  . Total hip arthroplasty  age 77    right hip for avascular necrosis of hip and spine  . Colonoscopy  2011 NUR [facility] SCREENING d50 v5    MILD [place] TICS, ? PREP ARTIFACT V. PROCTITIS-rX:[place]. next TCS 2021.  . Cardiac catheterization      3 stents placed  . Esophagogastroduodenoscopy  10/03/11    small hiatal hernia/gastric ulcers/stricutre in distal esophagus  . Mass excision Left 09/30/2014    Procedure: EXCISION MASS LEFT WRIST ;  Surgeon: Daryll Brod, MD;  Location: [place];  Service: Orthopedics;  Laterality: Left;  . Esophagogastroduodenoscopy N/A 03/02/2016    Dr. Oneida Alar: Schatzki's ring at GE junction s/p dilation, mild non-erosive gastritis/duodenitis   . Esophageal  dilation N/A 03/02/2016    Procedure: ESOPHAGEAL DILATION;  Surgeon: Danie Binder, MD;  [facility];  Service: Endoscopy;  Laterality: N/A;    There were no vitals filed for this visit.      Subjective Assessment - 06/25/16 1537    Subjective  S: I used the mower a lot this weekend.    Currently in Pain? Yes   Pain Score 2    Pain Location Shoulder   Pain Orientation Left   Pain Descriptors / Indicators Aching   Pain Type Acute pain   Pain Radiating Towards N/A   Pain Onset 1 to 4 weeks ago   Pain Frequency Occasional   Aggravating Factors  movement and use   Pain Relieving Factors Rest   Effect of Pain on Daily Activities None   Multiple Pain Sites No            [facility] OT Assessment - 06/25/16 1539    Assessment   Diagnosis Left rotator cuff syndrome   Precautions   Precautions None                  OT Treatments/Exercises (OP) - 06/25/16 1539    Exercises   Exercises Shoulder   Shoulder Exercises: Supine   Protraction PROM;AROM;10 reps   Horizontal ABduction PROM;AROM;10 reps  External Rotation PROM;AROM;10 reps   Internal Rotation PROM;AROM;10 reps   Flexion PROM;AROM;10 reps   ABduction PROM;10 reps   Shoulder Exercises: Standing   Protraction AROM;10 reps   Horizontal ABduction AROM;10 reps   External Rotation AROM;10 reps   Internal Rotation AROM;10 reps   Flexion AROM;10 reps   ABduction --   Manual Therapy   Manual Therapy Myofascial release   Manual therapy comments Manual therapy completed prior to exercises   Myofascial Release myofascial release and manual stretching completed to left upper arm, trapezius, and scapularis region to decrease fascial restrictions and increase joint mobility in a pain free zone.                 OT Education - 06/25/16 1605    Education provided Yes   Education Details Pt given print out of OT evaluation and reviewed POC and goals.    Person(s) Educated Patient   Methods  Explanation;Handout   Comprehension Verbalized understanding          OT Short Term Goals - 06/25/16 1607    OT SHORT TERM GOAL #1   Title Patient will be educated and independent with HEP to increase functional performance during daily tasks using LUE.    Time 4   Period Weeks   Status On-going   OT SHORT TERM GOAL #2   Title Patient will return to highest level of independence with all daily tasks using LUE.    Time 4   Period Weeks   Status On-going   OT SHORT TERM GOAL #3   Title Patient will decrease fascial restrictions to zero amount in LUE to increase functional mobility and comfort during use.    Time 4   Period Weeks   Status On-going   OT SHORT TERM GOAL #4   Title Patient will decrease pain level to 2/10 or less during daily tasks.    Time 4   Period Weeks   Status On-going   OT SHORT TERM GOAL #5   Title Patient will increase LUE strength to 3+/5 to increase ability to return to normal lightwieight lifting tasks at home.    Time 4   Period Weeks   Status On-going   OT SHORT TERM GOAL #6   Title Patient will increase A/ROM to [facility] to increase ability to complete activities at shoulder level or better.    Time 4   Period Weeks   Status On-going                  Plan - 06/25/16 1606    Clinical Impression Statement A: Initiated myofascial release, manual stretching, and A/ROM exercises. patient reported slight pain during shoulder flexion and horizontal abduction although he was able to complete exercsies with better ROM than at initial evaluation day.   Plan P: Update HEP to include A/ROM exercises. Complete all A/ROM exercise supine and standing. Add W arms.      Patient will benefit from skilled therapeutic intervention in order to improve the following deficits and impairments:  Decreased strength, Pain, Increased fascial restricitons, Decreased range of motion  Visit Diagnosis: Other symptoms and signs involving the musculoskeletal  system  Pain in left shoulder  Stiffness of left shoulder, not elsewhere classified    Problem List Patient Active Problem List   Diagnosis Date Noted  . Esophageal dysphagia 02/08/2016  . Hemorrhoids 02/08/2016  . Chronic radicular cervical pain 07/29/2014  . GERD (gastroesophageal reflux disease) 10/14/2012  . Constipation 10/14/2012  .  Stiffness of joint, not elsewhere classified, other specified site 02/21/2012  . PUD (peptic ulcer disease) 12/21/2011  . Dysphagia 07/25/2011  . Hyperlipidemia 08/12/2009  . ESSENTIAL HYPERTENSION, BENIGN 08/12/2009  . CAD, NATIVE VESSEL 08/12/2009  . CUTANEOUS ERUPTIONS, DRUG-INDUCED 01/09/2008  . LOCALIZED SUPERFICIAL SWELLING MASS OR LUMP 01/09/2008    Ailene Ravel, OTR/L,CBIS  [phone]  06/25/2016, 4:08 PM  [place] [address] [place], [place], [address] Phone: [phone]   Fax:  [phone]  Name: DERION HOLLIFIELD MRN: [id number] Date of Birth: Aug 15, 1951

## 2016-06-28 ENCOUNTER — Encounter (HOSPITAL_COMMUNITY): Payer: Self-pay

## 2016-06-28 ENCOUNTER — Ambulatory Visit (HOSPITAL_COMMUNITY): Payer: Medicare Other

## 2016-06-28 DIAGNOSIS — M25612 Stiffness of left shoulder, not elsewhere classified: Secondary | ICD-10-CM

## 2016-06-28 DIAGNOSIS — R29898 Other symptoms and signs involving the musculoskeletal system: Secondary | ICD-10-CM

## 2016-06-28 DIAGNOSIS — M25512 Pain in left shoulder: Secondary | ICD-10-CM | POA: Diagnosis not present

## 2016-06-28 NOTE — Patient Instructions (Addendum)
1) Shoulder Protraction    Begin with elbows by your side, slowly "punch" straight out in front of you keeping arms/elbows straight.      2) Shoulder Flexion  Supine:     Standing:         Begin with arms at your side with thumbs pointed up, slowly raise both arms up and forward towards overhead.         3) Horizontal abduction/adduction  Supine:   Standing:           Begin with arms straight out in front of you, bring out to the side in at "T" shape. Keep arms straight entire time.         4) Internal & External Rotation    *No band* -Stand with elbows at the side and elbows bent 90 degrees. Move your forearms away from your body, then bring back inward toward the body.     5) Shoulder Abduction  Supine:     Standing:       Lying on your back begin with your arms flat on the table next to your side. Slowly move your arms out to the side so that they go overhead, in a jumping jack or snow angel movement.      Repeat all exercises 10-15 times, 1-2 times per day.  

## 2016-06-28 NOTE — Therapy (Signed)
[place], [place], [address] Phone: [phone]   Fax:  [phone]  Occupational Therapy Treatment  Patient Details  Name: [name] [name] MRN: [id number] Date of Birth: 01-31-1951 Referring Provider: Dr. Verda Cumins  Encounter Date: 06/28/2016      OT End of Session - 06/28/16 1612    Visit Number 3   Number of Visits 9   Date for OT Re-Evaluation 07/18/16   Authorization Type 1) medicare part A 2) [place] Time Period before 10th visit   Authorization - Visit Number 3   Authorization - Number of Visits 10   OT Start Time 1515   OT Stop Time 1600   OT Time Calculation (min) 45 min   Activity Tolerance Patient tolerated treatment well   Behavior During Therapy [facility] for tasks assessed/performed      Past Medical History  Diagnosis Date  . HTN (hypertension)   . Hyperlipidemia   . Avascular necrosis of bone of hip ([place])     right  . Anxiety   . Paresthesias   . GERD (gastroesophageal reflux disease)   . Myocardial infarction ([place])   . CAD (coronary artery disease)     stents x3 2008  . Arthritis     back pain    Past Surgical History  Procedure Laterality Date  . Tonsillectomy  age 39  . Total hip arthroplasty  age 89    right hip for avascular necrosis of hip and spine  . Colonoscopy  2011 NUR [facility] SCREENING d50 v5    MILD [place] TICS, ? PREP ARTIFACT V. PROCTITIS-rX:[place]. next TCS 2021.  . Cardiac catheterization      3 stents placed  . Esophagogastroduodenoscopy  10/03/11    small hiatal hernia/gastric ulcers/stricutre in distal esophagus  . Mass excision Left 09/30/2014    Procedure: EXCISION MASS LEFT WRIST ;  Surgeon: Daryll Brod, MD;  Location: [place];  Service: Orthopedics;  Laterality: Left;  . Esophagogastroduodenoscopy N/A 03/02/2016    Dr. Oneida Alar: Schatzki's ring at GE junction s/p dilation, mild non-erosive gastritis/duodenitis   . Esophageal  dilation N/A 03/02/2016    Procedure: ESOPHAGEAL DILATION;  Surgeon: Danie Binder, MD;  [facility];  Service: Endoscopy;  Laterality: N/A;    There were no vitals filed for this visit.      Subjective Assessment - 06/28/16 1549    Subjective  S: I was working on my garage door the other day and I had to reach out quickly to catch something before it fell on my truck. I caught it with my left arm and it hurt so bad.    Currently in Pain? Yes   Pain Score 2    Pain Location Shoulder   Pain Orientation Left   Pain Descriptors / Indicators Aching   Pain Type Acute pain            [facility] OT Assessment - 06/28/16 1549    Assessment   Diagnosis Left rotator cuff syndrome   Precautions   Precautions None                  OT Treatments/Exercises (OP) - 06/28/16 1548    Exercises   Exercises Shoulder   Shoulder Exercises: Supine   Protraction PROM;5 reps;AROM;12 reps   Shoulder Exercises: Standing   Protraction AROM;12 reps   Horizontal ABduction AROM;12 reps   External Rotation AROM;12 reps   Internal  Rotation AROM;12 reps   Flexion AROM;12 reps   Flexion Limitations 25% range   ABduction AROM;12 reps   Manual Therapy   Manual Therapy Myofascial release   Manual therapy comments Manual therapy completed prior to exercises                OT Education - 06/28/16 1615    Education provided Yes   Education Details A/ROM exercises. Dicussed the rotator cuff muscle and which muscle he may be experiencing his partial tear in based on his dificits.    Person(s) Educated Patient   Methods Demonstration;Explanation;Handout;Verbal cues   Comprehension Verbalized understanding;Returned demonstration          OT Short Term Goals - 06/25/16 1607    OT SHORT TERM GOAL #1   Title Patient will be educated and independent with HEP to increase functional performance during daily tasks using LUE.    Time 4   Period Weeks   Status On-going   OT SHORT  TERM GOAL #2   Title Patient will return to highest level of independence with all daily tasks using LUE.    Time 4   Period Weeks   Status On-going   OT SHORT TERM GOAL #3   Title Patient will decrease fascial restrictions to zero amount in LUE to increase functional mobility and comfort during use.    Time 4   Period Weeks   Status On-going   OT SHORT TERM GOAL #4   Title Patient will decrease pain level to 2/10 or less during daily tasks.    Time 4   Period Weeks   Status On-going   OT SHORT TERM GOAL #5   Title Patient will increase LUE strength to 3+/5 to increase ability to return to normal lightwieight lifting tasks at home.    Time 4   Period Weeks   Status On-going   OT SHORT TERM GOAL #6   Title Patient will increase A/ROM to [facility] to increase ability to complete activities at shoulder level or better.    Time 4   Period Weeks   Status On-going                  Plan - 06/28/16 1616    Clinical Impression Statement A: HEP updated to include A/ROM exercises. Did not add W arms due to time constraint. Patient had increased difficulty with shoulder flexion supine and standing this date. VC for form and technique.   Plan P: Add scapular theraband exercises and W arms.      Patient will benefit from skilled therapeutic intervention in order to improve the following deficits and impairments:  Decreased strength, Pain, Increased fascial restricitons, Decreased range of motion  Visit Diagnosis: Other symptoms and signs involving the musculoskeletal system  Pain in left shoulder  Stiffness of left shoulder, not elsewhere classified    Problem List Patient Active Problem List   Diagnosis Date Noted  . Esophageal dysphagia 02/08/2016  . Hemorrhoids 02/08/2016  . Chronic radicular cervical pain 07/29/2014  . GERD (gastroesophageal reflux disease) 10/14/2012  . Constipation 10/14/2012  . Stiffness of joint, not elsewhere classified, other specified site  02/21/2012  . PUD (peptic ulcer disease) 12/21/2011  . Dysphagia 07/25/2011  . Hyperlipidemia 08/12/2009  . ESSENTIAL HYPERTENSION, BENIGN 08/12/2009  . CAD, NATIVE VESSEL 08/12/2009  . CUTANEOUS ERUPTIONS, DRUG-INDUCED 01/09/2008  . LOCALIZED SUPERFICIAL SWELLING MASS OR LUMP 01/09/2008    Ailene Ravel, OTR/L,CBIS  [phone]  06/28/2016, 4:20 PM  [place]  [facility] [address] [place], [place], [address] Phone: [phone]   Fax:  [phone]  Name: [name] [name] MRN: [id number] Date of Birth: [birth year]

## 2016-07-02 ENCOUNTER — Encounter (HOSPITAL_COMMUNITY): Payer: Self-pay

## 2016-07-02 ENCOUNTER — Ambulatory Visit (HOSPITAL_COMMUNITY): Payer: Medicare Other | Attending: Sports Medicine

## 2016-07-02 DIAGNOSIS — R29898 Other symptoms and signs involving the musculoskeletal system: Secondary | ICD-10-CM | POA: Diagnosis not present

## 2016-07-02 DIAGNOSIS — M25612 Stiffness of left shoulder, not elsewhere classified: Secondary | ICD-10-CM | POA: Diagnosis not present

## 2016-07-02 DIAGNOSIS — M25512 Pain in left shoulder: Secondary | ICD-10-CM

## 2016-07-02 NOTE — Therapy (Signed)
[place], [place], [address] Phone: [phone]   Fax:  [phone]  Occupational Therapy Treatment  Patient Details  Name: [name] [name] MRN: [id number] Date of Birth: 07/08/1951 Referring Provider: Dr. Verda Cumins  Encounter Date: 07/02/2016      OT End of Session - 07/02/16 1643    Visit Number 4   Number of Visits 9   Date for OT Re-Evaluation 07/18/16   Authorization Type 1) medicare part A 2) [place] Time Period before 10th visit   Authorization - Visit Number 4   Authorization - Number of Visits 10   OT Start Time [id number]   OT Stop Time 1645   OT Time Calculation (min) 41 min   Activity Tolerance Patient tolerated treatment well   Behavior During Therapy [facility] for tasks assessed/performed      Past Medical History  Diagnosis Date  . HTN (hypertension)   . Hyperlipidemia   . Avascular necrosis of bone of hip ([place])     right  . Anxiety   . Paresthesias   . GERD (gastroesophageal reflux disease)   . Myocardial infarction ([place])   . CAD (coronary artery disease)     stents x3 2008  . Arthritis     back pain    Past Surgical History  Procedure Laterality Date  . Tonsillectomy  age 41  . Total hip arthroplasty  age 51    right hip for avascular necrosis of hip and spine  . Colonoscopy  2011 NUR [facility] SCREENING d50 v5    MILD [place] TICS, ? PREP ARTIFACT V. PROCTITIS-rX:[place]. next TCS 2021.  . Cardiac catheterization      3 stents placed  . Esophagogastroduodenoscopy  10/03/11    small hiatal hernia/gastric ulcers/stricutre in distal esophagus  . Mass excision Left 09/30/2014    Procedure: EXCISION MASS LEFT WRIST ;  Surgeon: Daryll Brod, MD;  Location: [place];  Service: Orthopedics;  Laterality: Left;  . Esophagogastroduodenoscopy N/A 03/02/2016    Dr. Oneida Alar: Schatzki's ring at GE junction s/p dilation, mild non-erosive gastritis/duodenitis   . Esophageal  dilation N/A 03/02/2016    Procedure: ESOPHAGEAL DILATION;  Surgeon: Danie Binder, MD;  [facility];  Service: Endoscopy;  Laterality: N/A;    There were no vitals filed for this visit.      Subjective Assessment - 07/02/16 1616    Subjective  S: I think I may schedule a MRI and an appointment with Dr. Ronnie Derby. I don't want to see Dr. Durene Romans again.   Currently in Pain? Yes   Pain Score 2    Pain Location Shoulder   Pain Orientation Left   Pain Descriptors / Indicators Dull   Pain Type Acute pain   Pain Radiating Towards N/A   Pain Onset 1 to 4 weeks ago   Pain Frequency Constant   Aggravating Factors  Movement and use   Pain Relieving Factors Rest   Effect of Pain on Daily Activities None   Multiple Pain Sites No            [facility] OT Assessment - 07/02/16 1614    Assessment   Diagnosis Left rotator cuff syndrome   Precautions   Precautions None                  OT Treatments/Exercises (OP) - 07/02/16 1614    Exercises   Exercises Shoulder   Shoulder Exercises: Supine  Protraction PROM;5 reps;AROM;12 reps   Horizontal ABduction PROM;AROM;10 reps   External Rotation PROM;AROM;10 reps   Internal Rotation PROM;AROM;10 reps   Flexion PROM;AROM;10 reps;Limitations   Flexion Limitations Pt assisted with movement slightly.    ABduction PROM;AROM;10 reps   Shoulder Exercises: Standing   Protraction AROM;12 reps   Horizontal ABduction AROM;12 reps   External Rotation AROM;12 reps   Internal Rotation AROM;12 reps   Flexion AROM;12 reps   Flexion Limitations full range this date   ABduction AROM;12 reps   Shoulder Exercises: ROM/Strengthening   UBE (Upper Arm Bike) Level 1 2' reverse 2' forward   "W" Arms 10X   X to V Arms 10X   Proximal Shoulder Strengthening, Supine 10X   Manual Therapy   Manual Therapy Myofascial release   Manual therapy comments Manual therapy completed prior to exercises   Myofascial Release myofascial release and manual  stretching completed to left upper arm, trapezius, and scapularis region to decrease fascial restrictions and increase joint mobility in a pain free zone.                   OT Short Term Goals - 06/25/16 1607    OT SHORT TERM GOAL #1   Title Patient will be educated and independent with HEP to increase functional performance during daily tasks using LUE.    Time 4   Period Weeks   Status On-going   OT SHORT TERM GOAL #2   Title Patient will return to highest level of independence with all daily tasks using LUE.    Time 4   Period Weeks   Status On-going   OT SHORT TERM GOAL #3   Title Patient will decrease fascial restrictions to zero amount in LUE to increase functional mobility and comfort during use.    Time 4   Period Weeks   Status On-going   OT SHORT TERM GOAL #4   Title Patient will decrease pain level to 2/10 or less during daily tasks.    Time 4   Period Weeks   Status On-going   OT SHORT TERM GOAL #5   Title Patient will increase LUE strength to 3+/5 to increase ability to return to normal lightwieight lifting tasks at home.    Time 4   Period Weeks   Status On-going   OT SHORT TERM GOAL #6   Title Patient will increase A/ROM to [facility] to increase ability to complete activities at shoulder level or better.    Time 4   Period Weeks   Status On-going                  Plan - 07/02/16 1643    Clinical Impression Statement A: Added UBE bike, W arms, proximal shoulder strengthening supine, and X to V arms. Unable to add theraband exercises this date. Tower was in use.    Plan P: Add scapular theraband exercises.       Patient will benefit from skilled therapeutic intervention in order to improve the following deficits and impairments:  Decreased strength, Pain, Increased fascial restricitons, Decreased range of motion  Visit Diagnosis: Other symptoms and signs involving the musculoskeletal system  Pain in left shoulder  Stiffness of left  shoulder, not elsewhere classified    Problem List Patient Active Problem List   Diagnosis Date Noted  . Esophageal dysphagia 02/08/2016  . Hemorrhoids 02/08/2016  . Chronic radicular cervical pain 07/29/2014  . GERD (gastroesophageal reflux disease) 10/14/2012  . Constipation 10/14/2012  .  Stiffness of joint, not elsewhere classified, other specified site 02/21/2012  . PUD (peptic ulcer disease) 12/21/2011  . Dysphagia 07/25/2011  . Hyperlipidemia 08/12/2009  . ESSENTIAL HYPERTENSION, BENIGN 08/12/2009  . CAD, NATIVE VESSEL 08/12/2009  . CUTANEOUS ERUPTIONS, DRUG-INDUCED 01/09/2008  . LOCALIZED SUPERFICIAL SWELLING MASS OR LUMP 01/09/2008    Ailene Ravel, OTR/L,CBIS  [phone]  07/02/2016, 4:50 PM  [place] [address] [place], [place], [address] Phone: [phone]   Fax:  [phone]  Name: JARIF NONG MRN: [id number] Date of Birth: May 13, 1951

## 2016-07-05 ENCOUNTER — Ambulatory Visit (HOSPITAL_COMMUNITY): Payer: Medicare Other

## 2016-07-05 ENCOUNTER — Encounter (HOSPITAL_COMMUNITY): Payer: Self-pay

## 2016-07-05 DIAGNOSIS — R29898 Other symptoms and signs involving the musculoskeletal system: Secondary | ICD-10-CM | POA: Diagnosis not present

## 2016-07-05 DIAGNOSIS — M25512 Pain in left shoulder: Secondary | ICD-10-CM | POA: Diagnosis not present

## 2016-07-05 DIAGNOSIS — M25612 Stiffness of left shoulder, not elsewhere classified: Secondary | ICD-10-CM

## 2016-07-05 NOTE — Therapy (Signed)
[place], [place], [address] Phone: [phone]   Fax:  [phone]  Occupational Therapy Treatment  Patient Details  Name: [name] [name] MRN: [id number] Date of Birth: July 27, 1951 Referring Provider: Dr. Verda Cumins  Encounter Date: 07/05/2016      OT End of Session - 07/05/16 1839    Visit Number 5   Number of Visits 9   Date for OT Re-Evaluation 07/18/16   Authorization Type 1) medicare part A 2) [place] Time Period before 10th visit   Authorization - Visit Number 5   Authorization - Number of Visits 10   OT Start Time 1600   OT Stop Time 1645   OT Time Calculation (min) 45 min   Activity Tolerance Patient tolerated treatment well   Behavior During Therapy [facility] for tasks assessed/performed      Past Medical History  Diagnosis Date  . HTN (hypertension)   . Hyperlipidemia   . Avascular necrosis of bone of hip ([place])     right  . Anxiety   . Paresthesias   . GERD (gastroesophageal reflux disease)   . Myocardial infarction ([place])   . CAD (coronary artery disease)     stents x3 2008  . Arthritis     back pain    Past Surgical History  Procedure Laterality Date  . Tonsillectomy  age 37  . Total hip arthroplasty  age 64    right hip for avascular necrosis of hip and spine  . Colonoscopy  2011 NUR [facility] SCREENING d50 v5    MILD [place] TICS, ? PREP ARTIFACT V. PROCTITIS-rX:[place]. next TCS 2021.  . Cardiac catheterization      3 stents placed  . Esophagogastroduodenoscopy  10/03/11    small hiatal hernia/gastric ulcers/stricutre in distal esophagus  . Mass excision Left 09/30/2014    Procedure: EXCISION MASS LEFT WRIST ;  Surgeon: Daryll Brod, MD;  Location: [place];  Service: Orthopedics;  Laterality: Left;  . Esophagogastroduodenoscopy N/A 03/02/2016    Dr. Oneida Alar: Schatzki's ring at GE junction s/p dilation, mild non-erosive gastritis/duodenitis   . Esophageal  dilation N/A 03/02/2016    Procedure: ESOPHAGEAL DILATION;  Surgeon: Danie Binder, MD;  [facility];  Service: Endoscopy;  Laterality: N/A;    There were no vitals filed for this visit.      Subjective Assessment - 07/05/16 1632    Subjective  S: Would you get a MRI?   Currently in Pain? Yes   Pain Score 1    Pain Location Shoulder   Pain Orientation Left   Pain Descriptors / Indicators Dull   Pain Type Acute pain            [facility] OT Assessment - 07/05/16 1633    Assessment   Diagnosis Left rotator cuff syndrome   Precautions   Precautions None                  OT Treatments/Exercises (OP) - 07/05/16 1633    Exercises   Exercises Shoulder   Shoulder Exercises: Supine   Protraction PROM;5 reps;AROM;12 reps   Horizontal ABduction PROM;5 reps;AROM;12 reps   External Rotation PROM;5 reps;AROM;12 reps   Internal Rotation PROM;5 reps;AROM;12 reps   Flexion PROM;5 reps;AROM;12 reps;Limitations   Flexion Limitations Pt assisted with movement slightly.    ABduction PROM;5 reps;AROM;12 reps   Shoulder Exercises: Standing   Protraction AROM;12 reps   Horizontal ABduction AROM;12 reps  External Rotation AROM;12 reps   Internal Rotation AROM;12 reps   Flexion AROM;12 reps   Flexion Limitations full range this date   ABduction AROM;12 reps   Extension Theraband;10 reps   Theraband Level (Shoulder Extension) Level 2 (Red)   Row Theraband;10 reps   Theraband Level (Shoulder Row) Level 2 (Red)   Retraction Theraband;10 reps   Theraband Level (Shoulder Retraction) Level 2 (Red)   Shoulder Exercises: ROM/Strengthening   Proximal Shoulder Strengthening, Supine 10X   Manual Therapy   Manual Therapy Myofascial release   Manual therapy comments Manual therapy completed prior to exercises   Myofascial Release myofascial release and manual stretching completed to left upper arm, trapezius, and scapularis region to decrease fascial restrictions and increase  joint mobility in a pain free zone.                   OT Short Term Goals - 06/25/16 1607    OT SHORT TERM GOAL #1   Title Patient will be educated and independent with HEP to increase functional performance during daily tasks using LUE.    Time 4   Period Weeks   Status On-going   OT SHORT TERM GOAL #2   Title Patient will return to highest level of independence with all daily tasks using LUE.    Time 4   Period Weeks   Status On-going   OT SHORT TERM GOAL #3   Title Patient will decrease fascial restrictions to zero amount in LUE to increase functional mobility and comfort during use.    Time 4   Period Weeks   Status On-going   OT SHORT TERM GOAL #4   Title Patient will decrease pain level to 2/10 or less during daily tasks.    Time 4   Period Weeks   Status On-going   OT SHORT TERM GOAL #5   Title Patient will increase LUE strength to 3+/5 to increase ability to return to normal lightwieight lifting tasks at home.    Time 4   Period Weeks   Status On-going   OT SHORT TERM GOAL #6   Title Patient will increase A/ROM to [facility] to increase ability to complete activities at shoulder level or better.    Time 4   Period Weeks   Status On-going                  Plan - 07/05/16 1839    Clinical Impression Statement A: Added scapular theraband exercises this session to increase shoulder stability. Patient completed well with VC for form and technique. Continues to have difficulty with shoulder flexion supine.    Plan P: Add sidelying exercises to increase scapular stability.       Patient will benefit from skilled therapeutic intervention in order to improve the following deficits and impairments:  Decreased strength, Pain, Increased fascial restricitons, Decreased range of motion  Visit Diagnosis: Other symptoms and signs involving the musculoskeletal system  Pain in left shoulder  Stiffness of left shoulder, not elsewhere classified    Problem  List Patient Active Problem List   Diagnosis Date Noted  . Esophageal dysphagia 02/08/2016  . Hemorrhoids 02/08/2016  . Chronic radicular cervical pain 07/29/2014  . GERD (gastroesophageal reflux disease) 10/14/2012  . Constipation 10/14/2012  . Stiffness of joint, not elsewhere classified, other specified site 02/21/2012  . PUD (peptic ulcer disease) 12/21/2011  . Dysphagia 07/25/2011  . Hyperlipidemia 08/12/2009  . ESSENTIAL HYPERTENSION, BENIGN 08/12/2009  . CAD, NATIVE VESSEL  08/12/2009  . CUTANEOUS ERUPTIONS, DRUG-INDUCED 01/09/2008  . LOCALIZED SUPERFICIAL SWELLING MASS OR LUMP 01/09/2008    Ailene Ravel, OTR/L,CBIS  [phone]  07/05/2016, 6:41 PM  [place] [address] [place], [place], [address] Phone: [phone]   Fax:  [phone]  Name: [name] [name] MRN: [id number] Date of Birth: 04/21/1951

## 2016-07-09 ENCOUNTER — Encounter (HOSPITAL_COMMUNITY): Payer: Self-pay

## 2016-07-09 ENCOUNTER — Ambulatory Visit (HOSPITAL_COMMUNITY): Payer: Medicare Other

## 2016-07-09 DIAGNOSIS — M25612 Stiffness of left shoulder, not elsewhere classified: Secondary | ICD-10-CM

## 2016-07-09 DIAGNOSIS — R29898 Other symptoms and signs involving the musculoskeletal system: Secondary | ICD-10-CM | POA: Diagnosis not present

## 2016-07-09 DIAGNOSIS — M25512 Pain in left shoulder: Secondary | ICD-10-CM

## 2016-07-09 NOTE — Therapy (Addendum)
[place], [place], [address] Phone: [phone]   Fax:  [phone]  Occupational Therapy Treatment  Patient Details  Name: [name] [name] MRN: [id number] Date of Birth: [birth year] Referring Provider: Dr. Verda Cumins  Encounter Date: 07/09/2016      OT End of Session - 07/09/16 1550    Visit Number 6   Number of Visits 9   Date for OT Re-Evaluation 07/18/16   Authorization Type 1) medicare part A 2) [place] Time Period before 10th visit   Authorization - Visit Number 6   Authorization - Number of Visits 10   OT Start Time 1520   OT Stop Time 1600   OT Time Calculation (min) 40 min   Activity Tolerance Patient tolerated treatment well   Behavior During Therapy [facility] for tasks assessed/performed      Past Medical History  Diagnosis Date  . HTN (hypertension)   . Hyperlipidemia   . Avascular necrosis of bone of hip ([place])     right  . Anxiety   . Paresthesias   . GERD (gastroesophageal reflux disease)   . Myocardial infarction ([place])   . CAD (coronary artery disease)     stents x3 2008  . Arthritis     back pain    Past Surgical History  Procedure Laterality Date  . Tonsillectomy  age 5  . Total hip arthroplasty  age 21    right hip for avascular necrosis of hip and spine  . Colonoscopy  2011 NUR [facility] SCREENING d50 v5    MILD [place] TICS, ? PREP ARTIFACT V. PROCTITIS-rX:[place]. next TCS 2021.  . Cardiac catheterization      3 stents placed  . Esophagogastroduodenoscopy  10/03/11    small hiatal hernia/gastric ulcers/stricutre in distal esophagus  . Mass excision Left 09/30/2014    Procedure: EXCISION MASS LEFT WRIST ;  Surgeon: Daryll Brod, MD;  Location: [place];  Service: Orthopedics;  Laterality: Left;  . Esophagogastroduodenoscopy N/A 03/02/2016    Dr. Oneida Alar: Schatzki's ring at GE junction s/p dilation, mild non-erosive gastritis/duodenitis   . Esophageal  dilation N/A 03/02/2016    Procedure: ESOPHAGEAL DILATION;  Surgeon: Danie Binder, MD;  [facility];  Service: Endoscopy;  Laterality: N/A;    There were no vitals filed for this visit.      Subjective Assessment - 07/09/16 1522    Currently in Pain? Yes   Pain Score 1    Pain Location Shoulder   Pain Orientation Left   Pain Descriptors / Indicators Dull   Pain Type Acute pain   Pain Radiating Towards N/A   Pain Onset 1 to 4 weeks ago   Pain Frequency Occasional   Aggravating Factors  Certain movement (out to the side. external rotation with shoulder abducted)   Pain Relieving Factors Rest   Effect of Pain on Daily Activities None   Multiple Pain Sites No            [facility] OT Assessment - 07/09/16 1535    Assessment   Diagnosis Left rotator cuff syndrome   Precautions   Precautions None   AROM   Overall AROM Comments Assessed seated. IR/er adducted.   AROM Assessment Site Shoulder   Right/Left Shoulder Left   Left Shoulder Flexion 120 Degrees  previous: 60   Left Shoulder ABduction 125 Degrees  previous: 95   Left Shoulder Internal Rotation 90 Degrees  previous: same  Left Shoulder External Rotation 75 Degrees  previous: 71   PROM   Overall PROM Comments Assessed supine. IR/er adducted.   PROM Assessment Site Shoulder   Right/Left Shoulder Left   Left Shoulder Flexion 170 Degrees  previous: 165   Strength   Overall Strength Comments Assessed seated. IR/er adducted.   Strength Assessment Site Shoulder   Right/Left Shoulder Left   Left Shoulder Flexion 5/5  previous: 3-/5   Left Shoulder ABduction 4+/5  previous; 3/5   Left Shoulder Internal Rotation 4+/5  previous: 3/5   Left Shoulder External Rotation 3/5  previous: 3/5                  OT Treatments/Exercises (OP) - 07/09/16 1544    Exercises   Exercises Shoulder   Shoulder Exercises: Supine   Protraction PROM;5 reps;AROM;15 reps   Horizontal ABduction PROM;5 reps;AROM;15  reps   External Rotation PROM;5 reps;AROM;15 reps   Internal Rotation PROM;5 reps;AROM;15 reps   Flexion PROM;5 reps;AROM;15 reps;Limitations   Flexion Limitations Pt assisted with movement slightly.    ABduction PROM;5 reps;AROM;15 reps   Shoulder Exercises: Sidelying   External Rotation AROM;10 reps   Internal Rotation AROM;10 reps   Flexion AROM;10 reps;Limitations  Only able to achieve 50% range.   ABduction AROM;5 reps;Limitations  Only about to achieve approximately 5 degrees.   Shoulder Exercises: ROM/Strengthening   X to V Arms 10X   Manual Therapy   Manual Therapy Myofascial release   Manual therapy comments Manual therapy completed prior to exercises   Myofascial Release myofascial release and manual stretching completed to left upper arm, trapezius, and scapularis region to decrease fascial restrictions and increase joint mobility in a pain free zone.                   OT Short Term Goals - 07/09/16 1541    OT SHORT TERM GOAL #1   Title Patient will be educated and independent with HEP to increase functional performance during daily tasks using LUE.    Time 4   Period Weeks   Status Achieved   OT SHORT TERM GOAL #2   Title Patient will return to highest level of independence with all daily tasks using LUE.    Time 4   Period Weeks   Status On-going   OT SHORT TERM GOAL #3   Title Patient will decrease fascial restrictions to zero amount in LUE to increase functional mobility and comfort during use.    Time 4   Period Weeks   Status On-going   OT SHORT TERM GOAL #4   Title Patient will decrease pain level to 2/10 or less during daily tasks.    Time 4   Period Weeks   Status On-going   OT SHORT TERM GOAL #5   Title Patient will increase LUE strength to 3+/5 to increase ability to return to normal lightwieight lifting tasks at home.    Time 4   Period Weeks   Status Partially Met   OT SHORT TERM GOAL #6   Title Patient will increase A/ROM to [facility] to  increase ability to complete activities at shoulder level or better.    Time 4   Period Weeks   Status Achieved                  Plan - 07/09/16 1603    Clinical Impression Statement A: Added sidelying exercises this session to focus on shoulder and scapular stability. Increased difficulty  with sidelying exercises. 14/15 of supine shoulder flexion, patient was able to complete independently for the first time.Measurements take for MD appointment this week. Pt has improved in all areas. Continues to have decreased ROM ROM and strength in shoulder. Patient completes shoulder flexion against gravity with a compensatory technique of shoulder abduction. 2/6 goals have been met.   Plan P: Continue with sidelying exercises. Add wall push ups.      Patient will benefit from skilled therapeutic intervention in order to improve the following deficits and impairments:  Decreased strength, Pain, Increased fascial restricitons, Decreased range of motion  Visit Diagnosis: Other symptoms and signs involving the musculoskeletal system  Pain in left shoulder  Stiffness of left shoulder, not elsewhere classified    Problem List Patient Active Problem List   Diagnosis Date Noted  . Esophageal dysphagia 02/08/2016  . Hemorrhoids 02/08/2016  . Chronic radicular cervical pain 07/29/2014  . GERD (gastroesophageal reflux disease) 10/14/2012  . Constipation 10/14/2012  . Stiffness of joint, not elsewhere classified, other specified site 02/21/2012  . PUD (peptic ulcer disease) 12/21/2011  . Dysphagia 07/25/2011  . Hyperlipidemia 08/12/2009  . ESSENTIAL HYPERTENSION, BENIGN 08/12/2009  . CAD, NATIVE VESSEL 08/12/2009  . CUTANEOUS ERUPTIONS, DRUG-INDUCED 01/09/2008  . LOCALIZED SUPERFICIAL SWELLING MASS OR LUMP 01/09/2008   OCCUPATIONAL THERAPY DISCHARGE SUMMARY  Visits from Start of Care: 7  Current functional level related to goals / functional outcomes: See goals above   Remaining  deficits: See above   Education / Equipment: D/C recommended by MD for MRI Plan: Patient agrees to discharge.  Patient goals were not met. Patient is being discharged due to the physician's request.  ?????        Ailene Ravel, OTR/L,CBIS  [phone]  07/09/2016, 4:07 PM  [place] [address] [place], [place], [address] Phone: [phone]   Fax:  [phone]  Name: [name] [name] MRN: [id number] Date of Birth: 1951/03/04

## 2016-07-12 ENCOUNTER — Encounter (HOSPITAL_COMMUNITY): Payer: Medicare Other

## 2016-07-12 DIAGNOSIS — M25512 Pain in left shoulder: Secondary | ICD-10-CM | POA: Diagnosis not present

## 2016-07-12 DIAGNOSIS — S4982XA Other specified injuries of left shoulder and upper arm, initial encounter: Secondary | ICD-10-CM | POA: Diagnosis not present

## 2016-07-13 ENCOUNTER — Telehealth (HOSPITAL_COMMUNITY): Payer: Self-pay

## 2016-07-13 NOTE — Telephone Encounter (Signed)
07/13/16 pt saw his dr and he advised to cx the last remaining appts.  He will having a MRI and evaluate that and go from there

## 2016-07-16 ENCOUNTER — Encounter (HOSPITAL_COMMUNITY): Payer: Medicare Other

## 2016-07-19 ENCOUNTER — Encounter (HOSPITAL_COMMUNITY): Payer: Medicare Other

## 2016-07-20 ENCOUNTER — Encounter (HOSPITAL_COMMUNITY): Payer: Medicare Other

## 2016-07-21 DIAGNOSIS — M25512 Pain in left shoulder: Secondary | ICD-10-CM | POA: Diagnosis not present

## 2016-07-30 DIAGNOSIS — S4982XA Other specified injuries of left shoulder and upper arm, initial encounter: Secondary | ICD-10-CM | POA: Diagnosis not present

## 2016-07-30 DIAGNOSIS — M25512 Pain in left shoulder: Secondary | ICD-10-CM | POA: Diagnosis not present

## 2016-08-02 DIAGNOSIS — S46012D Strain of muscle(s) and tendon(s) of the rotator cuff of left shoulder, subsequent encounter: Secondary | ICD-10-CM | POA: Diagnosis not present

## 2016-08-10 ENCOUNTER — Encounter: Payer: Self-pay | Admitting: Internal Medicine

## 2016-08-17 NOTE — Telephone Encounter (Signed)
TRIED  TO CALL  UNABLE TO LEAVE  MESSAGE  WILL TRY LATER .Adonis Housekeeper

## 2016-08-22 DIAGNOSIS — L820 Inflamed seborrheic keratosis: Secondary | ICD-10-CM | POA: Diagnosis not present

## 2016-08-22 DIAGNOSIS — B078 Other viral warts: Secondary | ICD-10-CM | POA: Diagnosis not present

## 2016-08-22 DIAGNOSIS — L723 Sebaceous cyst: Secondary | ICD-10-CM | POA: Diagnosis not present

## 2016-08-22 DIAGNOSIS — L821 Other seborrheic keratosis: Secondary | ICD-10-CM | POA: Diagnosis not present

## 2016-08-31 ENCOUNTER — Telehealth: Payer: Self-pay | Admitting: Internal Medicine

## 2016-08-31 NOTE — Telephone Encounter (Signed)
New message   Pt verbalized that he wants to get his Chrisandra Netters through the [place]    Please fax the information to  [phone]   *STAT* If patient is at the pharmacy, call can be transferred to refill team.   1. Which medications need to be refilled? (please list name of each medication and dose if known) Zettia 10mg   2. Which pharmacy/location (including street and city if local pharmacy) is medication to be sent to? [facility]  3. Do they need a 30 day or 90 day supply?  Pt is requesting a year supply

## 2016-09-05 MED ORDER — EZETIMIBE 10 MG PO TABS: 10.0000 mg | ORAL_TABLET | Freq: Every day | ORAL | 3 refills | Status: DC

## 2016-09-05 NOTE — Telephone Encounter (Signed)
Printed script for Zetia 10 mg and v/o signed, faxed to [place] at [phone].

## 2016-09-19 ENCOUNTER — Encounter: Payer: Self-pay | Admitting: Physician Assistant

## 2016-10-03 ENCOUNTER — Ambulatory Visit (INDEPENDENT_AMBULATORY_CARE_PROVIDER_SITE_OTHER): Payer: Medicare Other | Admitting: Physician Assistant

## 2016-10-03 ENCOUNTER — Encounter: Payer: Self-pay | Admitting: Physician Assistant

## 2016-10-03 VITALS — BP 144/60 | HR 70 | Ht 72.0 in | Wt 177.8 lb

## 2016-10-03 DIAGNOSIS — I10 Essential (primary) hypertension: Secondary | ICD-10-CM

## 2016-10-03 DIAGNOSIS — R072 Precordial pain: Secondary | ICD-10-CM | POA: Diagnosis not present

## 2016-10-03 DIAGNOSIS — I2510 Atherosclerotic heart disease of native coronary artery without angina pectoris: Secondary | ICD-10-CM | POA: Diagnosis not present

## 2016-10-03 DIAGNOSIS — E7800 Pure hypercholesterolemia, unspecified: Secondary | ICD-10-CM | POA: Diagnosis not present

## 2016-10-03 DIAGNOSIS — Z01810 Encounter for preprocedural cardiovascular examination: Secondary | ICD-10-CM

## 2016-10-03 MED ORDER — PRAVASTATIN SODIUM 40 MG PO TABS: ORAL_TABLET | ORAL | 3 refills | Status: DC

## 2016-10-03 NOTE — Progress Notes (Signed)
Cardiology Office Note:    Date:  10/03/2016   ID:  RODOLPH PATILLO, DOB 1951-06-27, MRN [id number]  PCP:  Purvis Kilts, MD  Cardiologist:  Dr. Dorris Carnes   Electrophysiologist:  n/a  Referring MD: Sharilyn Sites, MD   Chief Complaint  Patient presents with  . Chest Pain    History of Present Illness:    [name] [name] is a 65 y.o. male with a hx of CAD s/p NSTEMI in 2008 tx with PCI of bifurcational lesion in LAD/Dx with 3 DES, HL, HTN.  He has a hx of rash with Plavix and Ticlid.  He underwent Plavix desensitization in 2009.  Last seen by Dr. Dorris Carnes in 1/17.    Here alone. He comes in today with c/o's of chest pain described as pressure ~ 2 weeks ago. It was very brief.  It was not like his angina. He was s/w nauseated. He has not had a recurrence.  He denies exertional chest pain or dyspnea on exertion.  He does note some fatigue.  Denies syncope.  Denies orthopnea, PND, edema.    Prior CV studies that were reviewed today include:    LHC 12/08 LAD ostial 30, proximal 40 ISR, D2 30 ISR, mid LAD 50 LCx ostial 40 RCA proximal 30, distal 50  LHC 8/08 LM distal 30 LAD proximal 80, D1 70, D2 95 LCx ostial 30 RCA mid 30 EF 60% PCI: DES 2 in the diagonal and DES 1 in LAD (bifurcational lesion)  Past Medical History:  Diagnosis Date  . Anxiety   . Arthritis    back pain  . Avascular necrosis of bone of hip ([place])    right  . CAD (coronary artery disease)    stents x3 2008  . GERD (gastroesophageal reflux disease)   . HTN (hypertension)   . Hyperlipidemia   . Myocardial infarction   . Paresthesias     Past Surgical History:  Procedure Laterality Date  . CARDIAC CATHETERIZATION     3 stents placed  . COLONOSCOPY  2011 NUR [facility] SCREENING d50 v5   MILD [place] TICS, ? PREP ARTIFACT V. PROCTITIS-rX:[place]. next TCS 2021.  . ESOPHAGEAL DILATION N/A 03/02/2016   Procedure: ESOPHAGEAL DILATION;  Surgeon: Danie Binder, MD;  [facility];  Service:  Endoscopy;  Laterality: N/A;  . ESOPHAGOGASTRODUODENOSCOPY  10/03/11   small hiatal hernia/gastric ulcers/stricutre in distal esophagus  . ESOPHAGOGASTRODUODENOSCOPY N/A 03/02/2016   Dr. Oneida Alar: Schatzki's ring at [place] junction s/p dilation, mild non-erosive gastritis/duodenitis   . MASS EXCISION Left 09/30/2014   Procedure: EXCISION MASS LEFT WRIST ;  Surgeon: Daryll Brod, MD;  Location: [place];  Service: Orthopedics;  Laterality: Left;  . TONSILLECTOMY  age 21  . TOTAL HIP ARTHROPLASTY  age 10   right hip for avascular necrosis of hip and spine    Current Medications: Current Meds  Medication Sig  . aspirin EC 81 MG tablet Take 81 mg by mouth daily.  . calcium carbonate (TUMS EX) 750 MG chewable tablet Chew 1 tablet by mouth 3 (three) times daily as needed for heartburn.   . Coenzyme Q10 200 MG capsule Take 200 mg by mouth daily.  Marland Kitchen ezetimibe (ZETIA) 10 MG tablet Take 1 tablet (10 mg total) by mouth daily.  . famotidine (PEPCID) 20 MG tablet Take 1 tablet (20 mg total) by mouth 2 (two) times daily.  Marland Kitchen levothyroxine (SYNTHROID, LEVOTHROID) 50 MCG tablet Take 50 mcg by mouth daily.   Marland Kitchen  losartan-hydrochlorothiazide (HYZAAR) 100-25 MG tablet Take 0.5 tablets by mouth 2 (two) times daily.  . nitroGLYCERIN (NITROSTAT) 0.4 MG SL tablet Place 1 tablet (0.4 mg total) under the tongue every 5 (five) minutes as needed. For chest pains  . pravastatin (PRAVACHOL) 40 MG tablet TAKE ONE TABLET BY MOUTH ONCE DAILY IN THE EVENING  . Saw Palmetto, Serenoa repens, (SAW PALMETTO PO) Take 1 tablet by mouth daily. PATIENT NOT SURE OF THE DOSE  . [DISCONTINUED] pravastatin (PRAVACHOL) 40 MG tablet TAKE ONE TABLET BY MOUTH ONCE DAILY IN THE EVENING     Allergies:   Other; Bee venom; Clopidogrel bisulfate; Crestor [rosuvastatin]; Lipitor [atorvastatin]; Penicillin g; Penicillins; and Clopidogrel   Social History   Social History  . Marital status: Single    Spouse name: N/A  . Number of  children: N/A  . Years of education: N/A   Social History Main Topics  . Smoking status: Former Smoker    Packs/day: 1.00    Years: 30.00    Types: Cigarettes    Quit date: 09/30/2007  . Smokeless tobacco: Never Used  . Alcohol use No  . Drug use: No  . Sexual activity: Not Asked   Other Topics Concern  . None   Social History Narrative  . None     Family History:  The patient's family history includes Dementia in his mother; Lung cancer (age of onset: 47) in his sister; Prostate cancer (age of onset: 32) in his father.   ROS:   Please see the history of present illness.    Review of Systems  Cardiovascular: Positive for chest pain.  Musculoskeletal: Positive for back pain and myalgias.   All other systems reviewed and are negative.   EKGs/Labs/Other Test Reviewed:    EKG:  EKG is  ordered today.  The ekg ordered today demonstrates NSR, HR 71, normal axis, QTc 410 ms, no change since 01/30/16  Recent Labs: 01/30/2016: BUN 15; Creat 0.90; Hemoglobin 14.4; Platelets 253; Potassium 4.6; Sodium 136   Recent Lipid Panel    Component Value Date/Time   CHOL 125 01/30/2016 1005   TRIG 119 01/30/2016 1005   TRIG 107 08/15/2009 1335   HDL 28 (L) 01/30/2016 1005   CHOLHDL 4.5 01/30/2016 1005   VLDL 24 01/30/2016 1005   LDLCALC 73 01/30/2016 1005   LDLCALC 87 08/15/2009 1335   Labs from the [facility] in [place] 8/17:  TC 165, TG 118, HDL 52, LDL 89, A1c 6.2, Hgb 15.1, K 4.8, SCr 0.82, ALT 23, TSH 2.04   Physical Exam:    VS:  BP (!) 144/60   Pulse 70   Ht 6' (1.829 m)   Wt 177 lb 12.8 oz (80.6 kg)   BMI 24.11 kg/m     Wt Readings from Last 3 Encounters:  10/03/16 177 lb 12.8 oz (80.6 kg)  06/04/16 178 lb 9.6 oz (81 kg)  03/02/16 178 lb (80.7 kg)     Physical Exam  Constitutional: He is oriented to person, place, and time. He appears well-developed and well-nourished. No distress.  HENT:  Head: Normocephalic and atraumatic.  Eyes: No scleral icterus.  Neck:  Normal range of motion. No JVD present. Carotid bruit is not present.  Cardiovascular: Normal rate, regular rhythm, S1 normal and S2 normal.   No murmur heard. Pulmonary/Chest: Effort normal and breath sounds normal. He has no wheezes. He has no rhonchi. He has no rales.  Abdominal: Soft. There is no tenderness.  Musculoskeletal: He exhibits no edema.  Neurological: He is alert and oriented to person, place, and time.  Skin: Skin is warm and dry.  Psychiatric: He has a normal mood and affect.    ASSESSMENT:    1. Precordial pain   2. Coronary artery disease involving native coronary artery of native heart without angina pectoris   3. Essential hypertension, benign   4. Pure hypercholesterolemia   5. Preoperative cardiovascular examination    PLAN:    In order of problems listed above:  1. Chest pain - His symptoms are atypical.  But, it has been ~ 10 years since his MI. He has not had any stress test since then.  Will obtain ETT-Myoview.   2. CAD - s/p NSTEMI in 2008 tx with DES x 3 to bifurcational LAD/Dx lesion.  Obtain Myoview as noted above.  Continue ASA, statin.   3. HTN - BP at home optimal.  Continue current Rx.  4. HL - LDL close to goal recently but this was not a fasting sample.  He is intol of Atorva and Rosuva.  Continue Prava and Zetia.  5. Surgical clearance - The patient needs left rotator cuff surgery with Dr. Onnie Graham at the end of this month. As noted, he will need to undergo stress testing.  This will need to be done prior to being cleared for surgery. If his stress test is low risk, he may proceed with low perioperative CV risk. Ideally, he should remain on aspirin without interruption. If the bleeding risk is too great, he may hold this for 7 days prior to surgery and resume as soon as possible postop.  If ASA is held, he will be at slightly increased risk of late stent thrombosis.   Medication Adjustments/Labs and Tests Ordered: Current medicines are reviewed  at length with the patient today.  Concerns regarding medicines are outlined above.  Medication changes, Labs and Tests ordered today are outlined in the Patient Instructions noted below. Patient Instructions  Medication Instructions:  A REFILL FOR PRAVASTATIN HAS BEEN SENT IN   Labwork: NONE  Testing/Procedures: Your physician has requested that you have en exercise stress myoview. For further information please visit [email or web address]. Please follow instruction sheet, as given.  Follow-Up: FOLLOW UP WITH DR. ROSS IN JAN 2018; WE WILL SEND OUT A REMINDER A COUPLE MONTHS EARLIER TO CALL AND MAKE APPT.   Any Other Special Instructions Will Be Listed Below (If Applicable).  If you need a refill on your cardiac medications before your next appointment, please call your pharmacy.   Signed, Richardson Dopp, PA-C  10/03/2016 5:31 PM    [facility] [place], [place], [place]  [address] Phone: [phone]; Fax: [phone]

## 2016-10-03 NOTE — Patient Instructions (Addendum)
Medication Instructions:  A REFILL FOR PRAVASTATIN HAS BEEN SENT IN   Labwork: NONE  Testing/Procedures: Your physician has requested that you have en exercise stress myoview. For further information please visit [email or web address]. Please follow instruction sheet, as given.  Follow-Up: FOLLOW UP WITH DR. ROSS IN JAN 2018; WE WILL SEND OUT A REMINDER A COUPLE MONTHS EARLIER TO CALL AND MAKE APPT.   Any Other Special Instructions Will Be Listed Below (If Applicable).  If you need a refill on your cardiac medications before your next appointment, please call your pharmacy.

## 2016-10-08 ENCOUNTER — Telehealth (HOSPITAL_COMMUNITY): Payer: Self-pay | Admitting: *Deleted

## 2016-10-08 NOTE — Addendum Note (Signed)
Addended by: Briant Cedar on: 10/08/2016 12:48 PM   Modules accepted: Orders

## 2016-10-08 NOTE — Telephone Encounter (Signed)
Attempted to call patient regarding upcoming appointment- no answer, unable to leave message.  [name] [name] Jacqueline  

## 2016-10-10 ENCOUNTER — Ambulatory Visit (HOSPITAL_COMMUNITY): Payer: Medicare Other | Attending: Cardiovascular Disease

## 2016-10-10 ENCOUNTER — Encounter: Payer: Self-pay | Admitting: Physician Assistant

## 2016-10-10 DIAGNOSIS — I2510 Atherosclerotic heart disease of native coronary artery without angina pectoris: Secondary | ICD-10-CM | POA: Diagnosis not present

## 2016-10-10 DIAGNOSIS — R9439 Abnormal result of other cardiovascular function study: Secondary | ICD-10-CM | POA: Diagnosis not present

## 2016-10-10 DIAGNOSIS — R072 Precordial pain: Secondary | ICD-10-CM | POA: Insufficient documentation

## 2016-10-10 LAB — MYOCARDIAL PERFUSION IMAGING
CHL CUP NUCLEAR SDS: 6
CSEPED: 8 min
CSEPEDS: 0 s
CSEPEW: 10.1 METS
CSEPHR: 94 %
CSEPPHR: 146 {beats}/min
LHR: 0.26
LV dias vol: 84 mL (ref 62–150)
LV sys vol: 37 mL
MPHR: 155 {beats}/min
Rest HR: 67 {beats}/min
SRS: 5
SSS: 11
TID: 0.88

## 2016-10-10 MED ORDER — TECHNETIUM TC 99M TETROFOSMIN IV KIT
32.5000 | PACK | Freq: Once | INTRAVENOUS | Status: AC | PRN
  Administered 2016-10-10: 32.5 via INTRAVENOUS
  Filled 2016-10-10: qty 33

## 2016-10-10 MED ORDER — TECHNETIUM TC 99M TETROFOSMIN IV KIT
10.6000 | PACK | Freq: Once | INTRAVENOUS | Status: AC | PRN
  Administered 2016-10-10: 10.6 via INTRAVENOUS
  Filled 2016-10-10: qty 11

## 2016-10-26 DIAGNOSIS — G8918 Other acute postprocedural pain: Secondary | ICD-10-CM | POA: Diagnosis not present

## 2016-10-26 DIAGNOSIS — M7522 Bicipital tendinitis, left shoulder: Secondary | ICD-10-CM | POA: Diagnosis not present

## 2016-10-26 DIAGNOSIS — S46012D Strain of muscle(s) and tendon(s) of the rotator cuff of left shoulder, subsequent encounter: Secondary | ICD-10-CM | POA: Diagnosis not present

## 2016-10-26 DIAGNOSIS — S46002A Unspecified injury of muscle(s) and tendon(s) of the rotator cuff of left shoulder, initial encounter: Secondary | ICD-10-CM | POA: Diagnosis not present

## 2016-10-26 DIAGNOSIS — M7542 Impingement syndrome of left shoulder: Secondary | ICD-10-CM | POA: Diagnosis not present

## 2016-11-02 DIAGNOSIS — S46012D Strain of muscle(s) and tendon(s) of the rotator cuff of left shoulder, subsequent encounter: Secondary | ICD-10-CM | POA: Diagnosis not present

## 2016-11-02 DIAGNOSIS — Z4789 Encounter for other orthopedic aftercare: Secondary | ICD-10-CM | POA: Diagnosis not present

## 2016-11-19 ENCOUNTER — Encounter (HOSPITAL_COMMUNITY): Payer: Self-pay

## 2016-11-19 ENCOUNTER — Ambulatory Visit (HOSPITAL_COMMUNITY): Payer: Medicare Other

## 2016-11-19 DIAGNOSIS — N138 Other obstructive and reflux uropathy: Secondary | ICD-10-CM | POA: Diagnosis not present

## 2016-11-20 ENCOUNTER — Ambulatory Visit (HOSPITAL_COMMUNITY): Payer: Medicare Other | Attending: Orthopedic Surgery

## 2016-11-20 DIAGNOSIS — M25612 Stiffness of left shoulder, not elsewhere classified: Secondary | ICD-10-CM | POA: Diagnosis not present

## 2016-11-20 DIAGNOSIS — M25512 Pain in left shoulder: Secondary | ICD-10-CM | POA: Diagnosis not present

## 2016-11-20 DIAGNOSIS — R29898 Other symptoms and signs involving the musculoskeletal system: Secondary | ICD-10-CM | POA: Insufficient documentation

## 2016-11-20 NOTE — Therapy (Signed)
[place], [place], [address] Phone: [phone]   Fax:  [phone]  Occupational Therapy Evaluation  Patient Details  Name: [name] [name] MRN: [id number] Date of Birth: Aug 11, 1951 Referring Provider: Dr. Justice Britain  Encounter Date: 11/20/2016      OT End of Session - 11/20/16 1701    Visit Number 1   Number of Visits 16   Date for OT Re-Evaluation 12/17/16   Authorization Type 1) medicare part A 2) [employer] employee PPO   Authorization Time Period before 10th visit   Authorization - Visit Number 1   Authorization - Number of Visits 10   OT Start Time [id number]   OT Stop Time 1345   OT Time Calculation (min) 40 min   Activity Tolerance Patient tolerated treatment well   Behavior During Therapy [facility] for tasks assessed/performed      Past Medical History:  Diagnosis Date  . Anxiety   . Arthritis    back pain  . Avascular necrosis of bone of hip ([place])    right  . CAD (coronary artery disease)    stents x3 2008  . GERD (gastroesophageal reflux disease)   . History of nuclear stress test    a. Myoview 10/17: EF 56%, diaphragmatic attenuation, no ischemia, low risk  . HTN (hypertension)   . Hyperlipidemia   . Myocardial infarction   . Paresthesias     Past Surgical History:  Procedure Laterality Date  . CARDIAC CATHETERIZATION     3 stents placed  . COLONOSCOPY  2011 NUR [facility] SCREENING d50 v5   MILD [place] TICS, ? PREP ARTIFACT V. PROCTITIS-rX:[place]. next TCS 2021.  . ESOPHAGEAL DILATION N/A 03/02/2016   Procedure: ESOPHAGEAL DILATION;  Surgeon: Danie Binder, MD;  [facility];  Service: Endoscopy;  Laterality: N/A;  . ESOPHAGOGASTRODUODENOSCOPY  10/03/11   small hiatal hernia/gastric ulcers/stricutre in distal esophagus  . ESOPHAGOGASTRODUODENOSCOPY N/A 03/02/2016   Dr. Oneida Alar: Schatzki's ring at [place] junction s/p dilation, mild non-erosive gastritis/duodenitis   . MASS EXCISION Left  09/30/2014   Procedure: EXCISION MASS LEFT WRIST ;  Surgeon: Daryll Brod, MD;  Location: [place];  Service: Orthopedics;  Laterality: Left;  . TONSILLECTOMY  age 18  . TOTAL HIP ARTHROPLASTY  age 50   right hip for avascular necrosis of hip and spine    There were no vitals filed for this visit.      Subjective Assessment - 11/20/16 1311    Subjective  S: I haven't attempted to do anything with this arm.    Pertinent History Patient is a 65 y/o male S/P left shoulder arthroscopic rotator cuff repair which occurred on 10/26/16. Pt reports that he is to complete 1 visit for a HEP today and he will then return after his follow up visit on 12/14/16. Dr. Onnie Graham has referred patient to occupational therapy for evaluation and treatment.    Special Tests FOTO score: 12/100   Patient Stated Goals To increase the use of my arm.   Currently in Pain? No/denies           [facility] OT Assessment - 11/20/16 1313      Assessment   Diagnosis left shoulder arthroscopic surgery   Referring Provider Dr. Justice Britain   Onset Date 10/26/16   Assessment 12/14/16 - supple   Prior Therapy Pt received OT services Summer 2017 for left rotator cuff tear.      Precautions   Precautions  Shoulder   Type of Shoulder Precautions P/ROM to the affected shoulder for the first 4 weeks from surgery. Goal: Near full P/ROM by 3-4 weeks. At 4 weeks post-op initiate AA/ROM and scapular stabilizers. At 6 weeks post-op intiate shoulder A/ROM. Gradually advance to progressive resistive exercises.    Shoulder Interventions Shoulder sling/immobilizer;At all times;Off for dressing/bathing/exercises     Restrictions   Weight Bearing Restrictions Yes     Balance Screen   Has the patient fallen in the past 6 months No     Home  Environment   Family/patient expects to be discharged to: Private residence     Prior Function   Level of [place] Retired     ADL   ADL comments Patient  is unable to use his LUE for any functional daily tasks.     Mobility   Mobility Status Independent     Written Expression   Dominant Hand Right     Vision - History   Baseline Vision No visual deficits     Cognition   Overall Cognitive Status Within Functional Limits for tasks assessed     ROM / Strength   AROM / PROM / Strength AROM;Strength;PROM     Palpation   Palpation comment Moderate fascial restrictions in left upper arm, trapezius, and scapularis region.     AROM   Overall AROM  Unable to assess;Due to precautions     PROM   Overall PROM Comments Assessed supine. IR/er adducted   PROM Assessment Site Shoulder   Right/Left Shoulder Left   Left Shoulder Flexion 95 Degrees   Left Shoulder ABduction 81 Degrees   Left Shoulder Internal Rotation 90 Degrees   Left Shoulder External Rotation 31 Degrees     Strength   Overall Strength Unable to assess;Due to precautions                         OT Education - 11/20/16 1700    Education provided Yes   Education Details Table slides, A/ROM wrist and elbow, and pendulums.   Person(s) Educated Patient   Methods Explanation;Demonstration;Verbal cues;Handout   Comprehension Returned demonstration;Verbalized understanding          OT Short Term Goals - 11/20/16 1706      OT SHORT TERM GOAL #1   Title Patient will be educated and independent with HEP to increase functional performance during daily tasks using LUE.    Time 4   Period Weeks   Status New     OT SHORT TERM GOAL #2   Title Patient will decrease fascial restrictions to min amount or less in LUE to increase functional mobility needed for reaching tasks.    Time 4   Period Weeks   Status New     OT SHORT TERM GOAL #3   Title Patient will decrease pain level when using LUE to 3/10 or less to increase ability to complete activities with LUE.   Time 4   Period Weeks   Status New     OT SHORT TERM GOAL #4   Title Patient will increase  LUE strength to 3+/5 to increase ability to complete lightweight lifting activities above waist level.    Time 4   Period Weeks   Status New     OT SHORT TERM GOAL #5   Title Patient will increase P/ROM to [employer] to increase ability to complete dressing tasks with LUE.    Time 4  Period Weeks   Status New           OT Long Term Goals - November 24, 2016 1708      OT LONG TERM GOAL #1   Title Patient will return to highest level of independence with all daily tasks using his LUE.   Time 8   Period Weeks   Status New     OT LONG TERM GOAL #2   Title Patient will decrease fascial restrictions to trace amount or less to be able to complete functional reaching with less difficulty.    Time 8   Period Weeks   Status New     OT LONG TERM GOAL #3   Title Patient will decrease pain level to 2/10 or less when using LUE to increase comfort level during daily tasks.    Time 8   Period Weeks   Status New     OT LONG TERM GOAL #4   Title Patient will increase A/ROM to WNL to increase ability to complete functional reaching tasks above shoulder level.    Time 8   Period Weeks   Status New     OT LONG TERM GOAL #5   Title Patient will increase LUE strength to 4/5 to be able to return to normal lifting activities with LUE.   Time 8   Period Weeks   Status New               Plan - 24-Nov-2016 1703    Clinical Impression Statement A: Patient is a 65 y/o male S/P left shoulder arthroscopic rotator cuff repair causing increased pain, fascial restrictions, and decreased strength and ROM resulting in difficulty completing daily tasks.    Rehab Potential Excellent   OT Frequency Other (comment)  pt will return to OT clinic after follow up visit with MD on 12/14/16. PT will more than likely require 2x/week for 8 weeks.   OT Treatment/Interventions Self-care/ADL training;Therapeutic exercise;Patient/family education;Manual Therapy;Ultrasound;Cryotherapy;Electrical Stimulation;Moist Heat;Passive  range of motion;DME and/or AE instruction;Therapeutic activities   Plan P: Pt will benefit from skilled OT services to increase functional performance when using LUE during daily tasks. Treatment Plan: Hold therapy until follow up appointment with MD on 12/14/16. Myofascial release, manual stretching, P/ROM, AA/ROM, A/ROM, and general strengthening.    Consulted and Agree with Plan of Care Patient      Patient will benefit from skilled therapeutic intervention in order to improve the following deficits and impairments:  Pain, Decreased range of motion, Impaired UE functional use, Increased fascial restricitons, Decreased strength  Visit Diagnosis: Other symptoms and signs involving the musculoskeletal system  Acute pain of left shoulder  Stiffness of left shoulder, not elsewhere classified      G-Codes - 11/24/2016 1711    Functional Assessment Tool Used FOTO score: 12/100 (88% impaired)   Functional Limitation Carrying, moving and handling objects   Carrying, Moving and Handling Objects Current Status [id number]) At least 80 percent but less than 100 percent impaired, limited or restricted   Carrying, Moving and Handling Objects Goal Status [id number]) At least 20 percent but less than 40 percent impaired, limited or restricted      Problem List Patient Active Problem List   Diagnosis Date Noted  . Esophageal dysphagia 02/08/2016  . Hemorrhoids 02/08/2016  . Chronic radicular cervical pain 07/29/2014  . GERD (gastroesophageal reflux disease) 10/14/2012  . Constipation 10/14/2012  . Stiffness of joint, not elsewhere classified, other specified site 02/21/2012  . PUD (peptic ulcer disease) 12/21/2011  .  Dysphagia 07/25/2011  . Hyperlipidemia 08/12/2009  . ESSENTIAL HYPERTENSION, BENIGN 08/12/2009  . CAD, NATIVE VESSEL 08/12/2009  . CUTANEOUS ERUPTIONS, DRUG-INDUCED 01/09/2008  . LOCALIZED SUPERFICIAL SWELLING MASS OR LUMP 01/09/2008   Ailene Ravel, OTR/L,CBIS   [phone]  11/20/2016, 5:21 PM  [place] [address] [place], [place], [address] Phone: [phone]   Fax:  [phone]  Name: [name] [name] MRN: [id number] Date of Birth: 1951/06/30

## 2016-11-20 NOTE — Patient Instructions (Signed)
TOWEL SLIDES COMPLETE FOR 1-3 MINUTES, 3-5 TIMES PER DAY  SHOULDER: Flexion On Table   Place hands on table, elbows straight. Move hips away from body. Press hands down into table. Hold ___ seconds. ___ reps per set, ___ sets per day, ___ days per week  Abduction (Passive)   With arm out to side, resting on table, lower head toward arm, keeping trunk away from table. Hold ____ seconds. Repeat ____ times. Do ____ sessions per day.  Copyright  VHI. All rights reserved.     Internal Rotation (Assistive)   Seated with elbow bent at right angle and held against side, slide arm on table surface in an inward arc. Repeat ____ times. Do ____ sessions per day. Activity: Use this motion to brush crumbs off the table.  Copyright  VHI. All rights reserved.    COMPLETE PENDULUM EXERCISES FOR 30 SECONDS TO A MINUTE EACH, 3-5 TIMES PER DAY. ROM: Pendulum (Side-to-Side)     http://orth.exer.us/792   Copyright  VHI. All rights reserved.  Pendulum Forward/Back   Bend forward 90 at waist, using table for support. Rock body forward and back to swing arm. Repeat ____ times. Do ____ sessions per day.  Copyright  VHI. All rights reserved.  Pendulum Circular   Bend forward 90 at waist, leaning on table for support. Rock body in a circular pattern to move arm clockwise ____ times then counterclockwise ____ times. Do ____ sessions per day.  Copyright  VHI. All rights reserved.  AROM: Wrist Extension   With right palm down, bend wrist up. Repeat 10____ times per set. Do ____ sets per session. Do __3__ sessions per day.  Copyright  VHI. All rights reserved.   AROM: Wrist Flexion   With right palm up, bend wrist up. Repeat ___10_ times per set. Do ____ sets per session. Do __3__ sessions per day.  Copyright  VHI. All rights reserved.   AROM: Forearm Pronation / Supination   With right arm in handshake position, slowly rotate palm down until stretch is felt. Relax. Then  rotate palm up until stretch is felt. Repeat __10__ times per set. Do ____ sets per session. Do __3__ sessions per day.  Copyright  VHI. All rights reserved.   AFlexion (Passive)   Use other hand to bend elbow, with thumb toward same shoulder. Do NOT force this motion. Hold ____ seconds. Repeat ____ times. Do ____ sessions per day.

## 2016-12-14 DIAGNOSIS — S46012D Strain of muscle(s) and tendon(s) of the rotator cuff of left shoulder, subsequent encounter: Secondary | ICD-10-CM | POA: Diagnosis not present

## 2016-12-14 DIAGNOSIS — Z4789 Encounter for other orthopedic aftercare: Secondary | ICD-10-CM | POA: Diagnosis not present

## 2016-12-17 ENCOUNTER — Encounter (HOSPITAL_COMMUNITY): Payer: Self-pay

## 2016-12-17 ENCOUNTER — Ambulatory Visit (HOSPITAL_COMMUNITY): Payer: Medicare Other | Attending: Orthopedic Surgery

## 2016-12-17 DIAGNOSIS — M25512 Pain in left shoulder: Secondary | ICD-10-CM | POA: Diagnosis not present

## 2016-12-17 DIAGNOSIS — R29898 Other symptoms and signs involving the musculoskeletal system: Secondary | ICD-10-CM | POA: Diagnosis not present

## 2016-12-17 DIAGNOSIS — M25612 Stiffness of left shoulder, not elsewhere classified: Secondary | ICD-10-CM

## 2016-12-17 NOTE — Therapy (Signed)
[place], [place], [address] Phone: [phone]   Fax:  [phone]  Occupational Therapy Treatment And reassessment Patient Details  Name: [name] [name] MRN: [id number] Date of Birth: 11/27/1951 Referring Provider: Dr. Justice Britain  Encounter Date: 12/17/2016      OT End of Session - 12/17/16 1106    Visit Number 2   Number of Visits 16   Date for OT Re-Evaluation 01/16/17   Authorization Type 1) medicare part A 2) [employer]BS/Federal employee PPO   Authorization Time Period before 10th visit   Authorization - Visit Number 2   Authorization - Number of Visits 10   OT Start Time 1032   OT Stop Time 1115   OT Time Calculation (min) 43 min   Activity Tolerance Patient tolerated treatment well   Behavior During Therapy [facility] for tasks assessed/performed      Past Medical History:  Diagnosis Date  . Anxiety   . Arthritis    back pain  . Avascular necrosis of bone of hip ([place])    right  . CAD (coronary artery disease)    stents x3 2008  . GERD (gastroesophageal reflux disease)   . History of nuclear stress test    a. Myoview 10/17: EF 56%, diaphragmatic attenuation, no ischemia, low risk  . HTN (hypertension)   . Hyperlipidemia   . Myocardial infarction   . Paresthesias     Past Surgical History:  Procedure Laterality Date  . CARDIAC CATHETERIZATION     3 stents placed  . COLONOSCOPY  2011 NUR [facility] SCREENING d50 v5   MILD [place] TICS, ? PREP ARTIFACT V. PROCTITIS-rX:[place]. next TCS 2021.  . ESOPHAGEAL DILATION N/A 03/02/2016   Procedure: ESOPHAGEAL DILATION;  Surgeon: Danie Binder, MD;  [facility];  Service: Endoscopy;  Laterality: N/A;  . ESOPHAGOGASTRODUODENOSCOPY  10/03/11   small hiatal hernia/gastric ulcers/stricutre in distal esophagus  . ESOPHAGOGASTRODUODENOSCOPY N/A 03/02/2016   Dr. Oneida Alar: Schatzki's ring at [place] junction s/p dilation, mild non-erosive gastritis/duodenitis   . MASS  EXCISION Left 09/30/2014   Procedure: EXCISION MASS LEFT WRIST ;  Surgeon: Daryll Brod, MD;  Location: [place];  Service: Orthopedics;  Laterality: Left;  . TONSILLECTOMY  age 57  . TOTAL HIP ARTHROPLASTY  age 42   right hip for avascular necrosis of hip and spine    There were no vitals filed for this visit.      Subjective Assessment - 12/17/16 1059    Subjective  S: I haven't worn the sling in the week. It only hurts if I'm move the wrong way.   Currently in Pain? No/denies            [facility] OT Assessment - 12/17/16 1036      Assessment   Diagnosis left shoulder arthroscopic surgery     Precautions   Precautions Shoulder   Type of Shoulder Precautions P/ROM to the affected shoulder for the first 4 weeks from surgery. Goal: Near full P/ROM by 3-4 weeks. At 4 weeks post-op initiate AA/ROM and scapular stabilizers. At 6 weeks post-op intiate shoulder A/ROM. Gradually advance to progressive resistive exercises.      ROM / Strength   AROM / PROM / Strength AROM;PROM;Strength     Palpation   Palpation comment Moderate fascial restrictions in left upper arm, trapezius, and scapularis region.     AROM   Overall AROM Comments Assessed seated. IR/er adducted.   AROM Assessment Site  Shoulder   Right/Left Shoulder Left   Left Shoulder Flexion 71 Degrees   Left Shoulder ABduction 71 Degrees   Left Shoulder Internal Rotation 90 Degrees   Left Shoulder External Rotation 28 Degrees     PROM   Overall PROM Comments Assessed supine. IR/er adducted   PROM Assessment Site Shoulder   Right/Left Shoulder Left   Left Shoulder Flexion 120 Degrees  previous: 95   Left Shoulder ABduction 115 Degrees  previous: 81   Left Shoulder Internal Rotation 90 Degrees  previous: same   Left Shoulder External Rotation 60 Degrees  previous: 31     Strength   Overall Strength Comments Assessed seated. IR/er adducted   Strength Assessment Site Shoulder   Right/Left Shoulder Left    Left Shoulder Flexion 3-/5   Left Shoulder ABduction 3-/5   Left Shoulder Internal Rotation 3/5   Left Shoulder External Rotation 3-/5                  OT Treatments/Exercises (OP) - 12/17/16 1053      Exercises   Exercises Shoulder     Shoulder Exercises: Supine   Protraction PROM;AAROM;10 reps   Horizontal ABduction PROM;AAROM;10 reps   External Rotation PROM;AAROM;10 reps   Internal Rotation PROM;AAROM;10 reps   Flexion PROM;AAROM;10 reps   ABduction PROM;AAROM;10 reps     Manual Therapy   Manual Therapy Myofascial release;Muscle Energy Technique   Manual therapy comments Manual therapy completed prior to exercises   Myofascial Release Myofascial release completed to left upper arm, trapezious, and scapularis region ro decrease fascial restrictions and increase joint mobility in a pain free zone.   Muscle Energy Technique Muscle energy technique completed to left anterior deltoid to to relax tone and improve range of motion.                 OT Education - 12/17/16 1104    Education provided Yes   Education Details AA/ROM exercises   Person(s) Educated Patient   Methods Explanation;Demonstration;Handout;Verbal cues   Comprehension Verbalized understanding;Returned demonstration;Verbal cues required          OT Short Term Goals - 12/17/16 1102      OT SHORT TERM GOAL #1   Title Patient will be educated and independent with HEP to increase functional performance during daily tasks using LUE.    Time 4   Period Weeks   Status On-going     OT SHORT TERM GOAL #2   Title Patient will decrease fascial restrictions to min amount or less in LUE to increase functional mobility needed for reaching tasks.    Time 4   Period Weeks   Status On-going     OT SHORT TERM GOAL #3   Title Patient will decrease pain level when using LUE to 3/10 or less to increase ability to complete activities with LUE.   Time 4   Period Weeks   Status On-going     OT  SHORT TERM GOAL #4   Title Patient will increase LUE strength to 3+/5 to increase ability to complete lightweight lifting activities above waist level.    Time 4   Period Weeks   Status On-going     OT SHORT TERM GOAL #5   Title Patient will increase P/ROM to [facility] to increase ability to complete dressing tasks with LUE.    Time 4   Period Weeks   Status On-going           OT Long Term Goals -  12/17/16 1102      OT LONG TERM GOAL #1   Title Patient will return to highest level of independence with all daily tasks using his LUE.   Time 8   Period Weeks   Status On-going     OT LONG TERM GOAL #2   Title Patient will decrease fascial restrictions to trace amount or less to be able to complete functional reaching with less difficulty.    Time 8   Period Weeks   Status On-going     OT LONG TERM GOAL #3   Title Patient will decrease pain level to 2/10 or less when using LUE to increase comfort level during daily tasks.    Time 8   Period Weeks   Status On-going     OT LONG TERM GOAL #4   Title Patient will increase A/ROM to WNL to increase ability to complete functional reaching tasks above shoulder level.    Time 8   Period Weeks   Status On-going     OT LONG TERM GOAL #5   Title Patient will increase LUE strength to 4/5 to be able to return to normal lifting activities with LUE.   Time 8   Period Weeks   Status On-going               Plan - 12/17/16 1139    Clinical Impression Statement A: Reassessment completed this date as patient has been completing a HEP of P/ROM. Patient shows progress with A/ROM and P/ROM measurements although is limited with both as well as strength and fascial restrictions. Patient reports that he not using his left arm for a lot of activities until he got the clear from therapy..    Plan P: Continue with OT services 2x/week for 4 weeks. Add pulleys and proximal shoulder strengthening next session.   OT Home Exercise Plan 12;18: AA/ROM  exercises. Pt was instructed to stop previous exercises.   Consulted and Agree with Plan of Care Patient      Patient will benefit from skilled therapeutic intervention in order to improve the following deficits and impairments:  Pain, Decreased range of motion, Impaired UE functional use, Increased fascial restricitons, Decreased strength  Visit Diagnosis: Other symptoms and signs involving the musculoskeletal system - Plan: Ot plan of care cert/re-cert  Acute pain of left shoulder - Plan: Ot plan of care cert/re-cert  Stiffness of left shoulder, not elsewhere classified - Plan: Ot plan of care cert/re-cert      G-Codes - [id number]    Functional Assessment Tool Used FOTO score: 58/100 (42% impaired)   Functional Limitation Carrying, moving and handling objects   Carrying, Moving and Handling Objects Current Status [id number]) At least 40 percent but less than 60 percent impaired, limited or restricted   Carrying, Moving and Handling Objects Goal Status [id number]) At least 20 percent but less than 40 percent impaired, limited or restricted      Problem List Patient Active Problem List   Diagnosis Date Noted  . Esophageal dysphagia 02/08/2016  . Hemorrhoids 02/08/2016  . Chronic radicular cervical pain 07/29/2014  . GERD (gastroesophageal reflux disease) 10/14/2012  . Constipation 10/14/2012  . Stiffness of joint, not elsewhere classified, other specified site 02/21/2012  . PUD (peptic ulcer disease) 12/21/2011  . Dysphagia 07/25/2011  . Hyperlipidemia 08/12/2009  . ESSENTIAL HYPERTENSION, BENIGN 08/12/2009  . CAD, NATIVE VESSEL 08/12/2009  . CUTANEOUS ERUPTIONS, DRUG-INDUCED 01/09/2008  . LOCALIZED SUPERFICIAL SWELLING MASS OR LUMP 01/09/2008   Mickel Baas  Mariavictoria Nottingham, OTR/L,CBIS  [phone]  12/17/2016, 11:47 AM  [place] [address] [place], [place], [address] Phone: [phone]   Fax:  [phone]  Name: HANNAN MCNAMAR MRN: [id number] Date of Birth: [birth year]

## 2016-12-17 NOTE — Patient Instructions (Signed)

## 2016-12-19 ENCOUNTER — Encounter (HOSPITAL_COMMUNITY): Payer: Self-pay

## 2016-12-19 ENCOUNTER — Ambulatory Visit (HOSPITAL_COMMUNITY): Payer: Medicare Other

## 2016-12-19 DIAGNOSIS — M25512 Pain in left shoulder: Secondary | ICD-10-CM

## 2016-12-19 DIAGNOSIS — R29898 Other symptoms and signs involving the musculoskeletal system: Secondary | ICD-10-CM | POA: Diagnosis not present

## 2016-12-19 DIAGNOSIS — M25612 Stiffness of left shoulder, not elsewhere classified: Secondary | ICD-10-CM

## 2016-12-19 NOTE — Therapy (Signed)
[place], [place], [address] Phone: [phone]   Fax:  [phone]  Occupational Therapy Treatment  Patient Details  Name: [name] [name] MRN: [id number] Date of Birth: 28-Aug-1951 Referring Provider: Dr. Justice Britain  Encounter Date: 12/19/2016      OT End of Session - 12/19/16 1133    Visit Number 3   Number of Visits 16   Date for OT Re-Evaluation 01/16/17   Authorization Type 1) medicare part A 2) [employer] employee PPO   Authorization Time Period before 10th visit   Authorization - Visit Number 3   Authorization - Number of Visits 10   OT Start Time 1035   OT Stop Time 1115   OT Time Calculation (min) 40 min   Activity Tolerance Patient tolerated treatment well   Behavior During Therapy [facility] for tasks assessed/performed      Past Medical History:  Diagnosis Date  . Anxiety   . Arthritis    back pain  . Avascular necrosis of bone of hip ([place])    right  . CAD (coronary artery disease)    stents x3 2008  . GERD (gastroesophageal reflux disease)   . History of nuclear stress test    a. Myoview 10/17: EF 56%, diaphragmatic attenuation, no ischemia, low risk  . HTN (hypertension)   . Hyperlipidemia   . Myocardial infarction   . Paresthesias     Past Surgical History:  Procedure Laterality Date  . CARDIAC CATHETERIZATION     3 stents placed  . COLONOSCOPY  2011 NUR [facility] SCREENING d50 v5   MILD [place] TICS, ? PREP ARTIFACT V. PROCTITIS-rX:[place]. next TCS 2021.  . ESOPHAGEAL DILATION N/A 03/02/2016   Procedure: ESOPHAGEAL DILATION;  Surgeon: Danie Binder, MD;  [facility];  Service: Endoscopy;  Laterality: N/A;  . ESOPHAGOGASTRODUODENOSCOPY  10/03/11   small hiatal hernia/gastric ulcers/stricutre in distal esophagus  . ESOPHAGOGASTRODUODENOSCOPY N/A 03/02/2016   Dr. Oneida Alar: Schatzki's ring at [place] junction s/p dilation, mild non-erosive gastritis/duodenitis   . MASS EXCISION Left  09/30/2014   Procedure: EXCISION MASS LEFT WRIST ;  Surgeon: Daryll Brod, MD;  Location: [place];  Service: Orthopedics;  Laterality: Left;  . TONSILLECTOMY  age 36  . TOTAL HIP ARTHROPLASTY  age 66   right hip for avascular necrosis of hip and spine    There were no vitals filed for this visit.      Subjective Assessment - 12/19/16 1103    Subjective  S: I'm not doing too much with my shoulder. I don't want to hurt it. I'm just really careful.   Currently in Pain? Yes   Pain Score 3    Pain Location Shoulder   Pain Orientation Left   Pain Descriptors / Indicators Sore   Pain Type Acute pain   Pain Radiating Towards N/A   Pain Onset In the past 7 days   Pain Frequency Occasional   Aggravating Factors  Use and movement   Pain Relieving Factors Rest   Effect of Pain on Daily Activities Min effect   Multiple Pain Sites No            [facility] OT Assessment - 12/19/16 1104      Assessment   Diagnosis left shoulder arthroscopic surgery     Precautions   Precautions Shoulder   Type of Shoulder Precautions P/ROM to the affected shoulder for the first 4 weeks from surgery. Goal: Near full P/ROM by  3-4 weeks. At 4 weeks post-op initiate AA/ROM and scapular stabilizers. At 6 weeks post-op intiate shoulder A/ROM. Gradually advance to progressive resistive exercises.                   OT Treatments/Exercises (OP) - 12/19/16 1051      Exercises   Exercises Shoulder     Shoulder Exercises: Supine   Protraction PROM;AAROM;10 reps   Horizontal ABduction PROM;AAROM;10 reps   External Rotation PROM;AAROM;10 reps   Internal Rotation PROM;AAROM;10 reps   Flexion PROM;AAROM;10 reps   ABduction PROM;AAROM;10 reps     Shoulder Exercises: Standing   Protraction AAROM;10 reps   Horizontal ABduction AAROM;10 reps   External Rotation AAROM;10 reps   Internal Rotation AAROM;10 reps   Flexion AAROM;10 reps   ABduction AAROM;10 reps     Shoulder Exercises:  Pulleys   Flexion 1 minute   ABduction 1 minute     Shoulder Exercises: ROM/Strengthening   Wall Wash 1'     Manual Therapy   Manual Therapy Myofascial release;Muscle Energy Technique   Manual therapy comments Manual therapy completed prior to exercises   Myofascial Release Myofascial release completed to left upper arm, trapezious, and scapularis region ro decrease fascial restrictions and increase joint mobility in a pain free zone.   Muscle Energy Technique Muscle energy technique completed to left anterior deltoid to to relax tone and improve range of motion.                   OT Short Term Goals - 12/17/16 1102      OT SHORT TERM GOAL #1   Title Patient will be educated and independent with HEP to increase functional performance during daily tasks using LUE.    Time 4   Period Weeks   Status On-going     OT SHORT TERM GOAL #2   Title Patient will decrease fascial restrictions to min amount or less in LUE to increase functional mobility needed for reaching tasks.    Time 4   Period Weeks   Status On-going     OT SHORT TERM GOAL #3   Title Patient will decrease pain level when using LUE to 3/10 or less to increase ability to complete activities with LUE.   Time 4   Period Weeks   Status On-going     OT SHORT TERM GOAL #4   Title Patient will increase LUE strength to 3+/5 to increase ability to complete lightweight lifting activities above waist level.    Time 4   Period Weeks   Status On-going     OT SHORT TERM GOAL #5   Title Patient will increase P/ROM to [facility] to increase ability to complete dressing tasks with LUE.    Time 4   Period Weeks   Status On-going           OT Long Term Goals - 12/17/16 1102      OT LONG TERM GOAL #1   Title Patient will return to highest level of independence with all daily tasks using his LUE.   Time 8   Period Weeks   Status On-going     OT LONG TERM GOAL #2   Title Patient will decrease fascial restrictions to  trace amount or less to be able to complete functional reaching with less difficulty.    Time 8   Period Weeks   Status On-going     OT LONG TERM GOAL #3   Title Patient will  decrease pain level to 2/10 or less when using LUE to increase comfort level during daily tasks.    Time 8   Period Weeks   Status On-going     OT LONG TERM GOAL #4   Title Patient will increase A/ROM to WNL to increase ability to complete functional reaching tasks above shoulder level.    Time 8   Period Weeks   Status On-going     OT LONG TERM GOAL #5   Title Patient will increase LUE strength to 4/5 to be able to return to normal lifting activities with LUE.   Time 8   Period Weeks   Status On-going               Plan - 12/19/16 1134    Clinical Impression Statement A: Completed AA/ROM supine and standing this session. Added wall wash and pulleys this session. VC for form and technique. Pt was able to tolerate further ROM during passive stretching this session.   Plan P: Add proximal shoulder strengthening.       Patient will benefit from skilled therapeutic intervention in order to improve the following deficits and impairments:  Pain, Decreased range of motion, Impaired UE functional use, Increased fascial restricitons, Decreased strength  Visit Diagnosis: Other symptoms and signs involving the musculoskeletal system  Acute pain of left shoulder  Stiffness of left shoulder, not elsewhere classified    Problem List Patient Active Problem List   Diagnosis Date Noted  . Esophageal dysphagia 02/08/2016  . Hemorrhoids 02/08/2016  . Chronic radicular cervical pain 07/29/2014  . GERD (gastroesophageal reflux disease) 10/14/2012  . Constipation 10/14/2012  . Stiffness of joint, not elsewhere classified, other specified site 02/21/2012  . PUD (peptic ulcer disease) 12/21/2011  . Dysphagia 07/25/2011  . Hyperlipidemia 08/12/2009  . ESSENTIAL HYPERTENSION, BENIGN 08/12/2009  . CAD, NATIVE  VESSEL 08/12/2009  . CUTANEOUS ERUPTIONS, DRUG-INDUCED 01/09/2008  . LOCALIZED SUPERFICIAL SWELLING MASS OR LUMP 01/09/2008   Ailene Ravel, OTR/L,CBIS  [phone]  12/19/2016, 11:36 AM  [place] [address] [place], [place], [address] Phone: [phone]   Fax:  [phone]  Name: [name] [name] MRN: [id number] Date of Birth: 30-Apr-1951

## 2016-12-26 ENCOUNTER — Telehealth (HOSPITAL_COMMUNITY): Payer: Self-pay | Admitting: Family Medicine

## 2016-12-26 ENCOUNTER — Ambulatory Visit (HOSPITAL_COMMUNITY): Payer: Medicare Other | Admitting: Occupational Therapy

## 2016-12-26 NOTE — Telephone Encounter (Signed)
12/26/16 pt cx - he said that he has picked up a stomach virus and hopes to be here Friday.

## 2016-12-28 ENCOUNTER — Ambulatory Visit (HOSPITAL_COMMUNITY): Payer: Medicare Other | Admitting: Occupational Therapy

## 2016-12-28 ENCOUNTER — Encounter (HOSPITAL_COMMUNITY): Payer: Self-pay | Admitting: Occupational Therapy

## 2016-12-28 DIAGNOSIS — M25512 Pain in left shoulder: Secondary | ICD-10-CM | POA: Diagnosis not present

## 2016-12-28 DIAGNOSIS — R29898 Other symptoms and signs involving the musculoskeletal system: Secondary | ICD-10-CM | POA: Diagnosis not present

## 2016-12-28 DIAGNOSIS — M25612 Stiffness of left shoulder, not elsewhere classified: Secondary | ICD-10-CM

## 2016-12-28 NOTE — Therapy (Signed)
[place], [place], [address] Phone: [phone]   Fax:  [phone]  Occupational Therapy Treatment  Patient Details  Name: [name] [name] MRN: [id number] Date of Birth: May 16, 1951 Referring Provider: Dr. Justice Britain  Encounter Date: 12/28/2016      OT End of Session - 12/28/16 1611    Visit Number 4   Number of Visits 16   Date for OT Re-Evaluation 01/16/17   Authorization Type 1) medicare part A 2) BCBS/Federal employee PPO   Authorization Time Period before 10th visit   Authorization - Visit Number 4   Authorization - Number of Visits 10   OT Start Time 1515   OT Stop Time 1557   OT Time Calculation (min) 42 min   Activity Tolerance Patient tolerated treatment well   Behavior During Therapy [facility] for tasks assessed/performed      Past Medical History:  Diagnosis Date  . Anxiety   . Arthritis    back pain  . Avascular necrosis of bone of hip ([place])    right  . CAD (coronary artery disease)    stents x3 2008  . GERD (gastroesophageal reflux disease)   . History of nuclear stress test    a. Myoview 10/17: EF 56%, diaphragmatic attenuation, no ischemia, low risk  . HTN (hypertension)   . Hyperlipidemia   . Myocardial infarction   . Paresthesias     Past Surgical History:  Procedure Laterality Date  . CARDIAC CATHETERIZATION     3 stents placed  . COLONOSCOPY  2011 NUR [facility] SCREENING d50 v5   MILD [place] TICS, ? PREP ARTIFACT V. PROCTITIS-rX:[place]. next TCS 2021.  . ESOPHAGEAL DILATION N/A 03/02/2016   Procedure: ESOPHAGEAL DILATION;  Surgeon: Danie Binder, MD;  [facility];  Service: Endoscopy;  Laterality: N/A;  . ESOPHAGOGASTRODUODENOSCOPY  10/03/11   small hiatal hernia/gastric ulcers/stricutre in distal esophagus  . ESOPHAGOGASTRODUODENOSCOPY N/A 03/02/2016   Dr. Oneida Alar: Schatzki's ring at [place] junction s/p dilation, mild non-erosive gastritis/duodenitis   . MASS EXCISION Left  09/30/2014   Procedure: EXCISION MASS LEFT WRIST ;  Surgeon: Daryll Brod, MD;  Location: [place];  Service: Orthopedics;  Laterality: Left;  . TONSILLECTOMY  age 37  . TOTAL HIP ARTHROPLASTY  age 42   right hip for avascular necrosis of hip and spine    There were no vitals filed for this visit.      Subjective Assessment - 12/28/16 1515    Subjective  S: I'm just having some soreness where I did my exercises yesterday.    Currently in Pain? No/denies            [facility] OT Assessment - 12/28/16 1515      Assessment   Diagnosis left shoulder arthroscopic surgery     Precautions   Precautions Shoulder   Type of Shoulder Precautions P/ROM to the affected shoulder for the first 4 weeks from surgery. Goal: Near full P/ROM by 3-4 weeks. At 4 weeks post-op initiate AA/ROM and scapular stabilizers. At 6 weeks post-op intiate shoulder A/ROM. Gradually advance to progressive resistive exercises.                   OT Treatments/Exercises (OP) - 12/28/16 1517      Exercises   Exercises Shoulder     Shoulder Exercises: Supine   Protraction PROM;AAROM;10 reps   Horizontal ABduction PROM;AAROM;10 reps   External Rotation PROM;AAROM;10 reps   Internal  Rotation PROM;AAROM;10 reps   Flexion PROM;AAROM;10 reps   ABduction PROM;AAROM;10 reps     Shoulder Exercises: Standing   Protraction AAROM;10 reps   Horizontal ABduction AAROM;10 reps   External Rotation AAROM;10 reps   Internal Rotation AAROM;10 reps   Flexion AAROM;10 reps   ABduction AAROM;10 reps     Shoulder Exercises: Pulleys   Flexion 1 minute   ABduction 1 minute     Shoulder Exercises: ROM/Strengthening   Wall Wash 1'   Proximal Shoulder Strengthening, Supine 10X each no rest breaks   Proximal Shoulder Strengthening, Seated 10X each no rest breaks     Manual Therapy   Manual Therapy Myofascial release;Muscle Energy Technique   Manual therapy comments Manual therapy completed prior to  exercises   Myofascial Release Myofascial release completed to left upper arm, trapezious, and scapularis region ro decrease fascial restrictions and increase joint mobility in a pain free zone.   Muscle Energy Technique Muscle energy technique completed to left anterior deltoid to to relax tone and improve range of motion.                   OT Short Term Goals - 12/17/16 1102      OT SHORT TERM GOAL #1   Title Patient will be educated and independent with HEP to increase functional performance during daily tasks using LUE.    Time 4   Period Weeks   Status On-going     OT SHORT TERM GOAL #2   Title Patient will decrease fascial restrictions to min amount or less in LUE to increase functional mobility needed for reaching tasks.    Time 4   Period Weeks   Status On-going     OT SHORT TERM GOAL #3   Title Patient will decrease pain level when using LUE to 3/10 or less to increase ability to complete activities with LUE.   Time 4   Period Weeks   Status On-going     OT SHORT TERM GOAL #4   Title Patient will increase LUE strength to 3+/5 to increase ability to complete lightweight lifting activities above waist level.    Time 4   Period Weeks   Status On-going     OT SHORT TERM GOAL #5   Title Patient will increase P/ROM to [facility] to increase ability to complete dressing tasks with LUE.    Time 4   Period Weeks   Status On-going           OT Long Term Goals - 12/17/16 1102      OT LONG TERM GOAL #1   Title Patient will return to highest level of independence with all daily tasks using his LUE.   Time 8   Period Weeks   Status On-going     OT LONG TERM GOAL #2   Title Patient will decrease fascial restrictions to trace amount or less to be able to complete functional reaching with less difficulty.    Time 8   Period Weeks   Status On-going     OT LONG TERM GOAL #3   Title Patient will decrease pain level to 2/10 or less when using LUE to increase comfort  level during daily tasks.    Time 8   Period Weeks   Status On-going     OT LONG TERM GOAL #4   Title Patient will increase A/ROM to WNL to increase ability to complete functional reaching tasks above shoulder level.    Time  8   Period Weeks   Status On-going     OT LONG TERM GOAL #5   Title Patient will increase LUE strength to 4/5 to be able to return to normal lifting activities with LUE.   Time 8   Period Weeks   Status On-going               Plan - 12/28/16 1612    Clinical Impression Statement A: Added proximal shoulder strengthening in supine and standing, verbal cuing for form and technqiue. Continued with AA/ROM, pulleys, and wall wash. Pt requires intermittent cuing for form during exercises, no increased pain at end of session.    Plan P: Continue with AA/ROM increasing repetitions to 12, add rhythmic shoulder stabilization   OT Home Exercise Plan 12;18: AA/ROM exercises. Pt was instructed to stop previous exercises.   Consulted and Agree with Plan of Care Patient      Patient will benefit from skilled therapeutic intervention in order to improve the following deficits and impairments:  Pain, Decreased range of motion, Impaired UE functional use, Increased fascial restricitons, Decreased strength  Visit Diagnosis: Other symptoms and signs involving the musculoskeletal system  Acute pain of left shoulder  Stiffness of left shoulder, not elsewhere classified    Problem List Patient Active Problem List   Diagnosis Date Noted  . Esophageal dysphagia 02/08/2016  . Hemorrhoids 02/08/2016  . Chronic radicular cervical pain 07/29/2014  . GERD (gastroesophageal reflux disease) 10/14/2012  . Constipation 10/14/2012  . Stiffness of joint, not elsewhere classified, other specified site 02/21/2012  . PUD (peptic ulcer disease) 12/21/2011  . Dysphagia 07/25/2011  . Hyperlipidemia 08/12/2009  . ESSENTIAL HYPERTENSION, BENIGN 08/12/2009  . CAD, NATIVE VESSEL  08/12/2009  . CUTANEOUS ERUPTIONS, DRUG-INDUCED 01/09/2008  . LOCALIZED SUPERFICIAL SWELLING MASS OR LUMP 01/09/2008   Guadelupe Sabin, OTR/L  [phone] 12/28/2016, 4:14 PM  [place] [address] [place], [place], [address] Phone: [phone]   Fax:  [phone]  Name: BRAXON JOICE MRN: [id number] Date of Birth: 11-03-1951

## 2017-01-01 ENCOUNTER — Encounter (HOSPITAL_COMMUNITY): Payer: Self-pay

## 2017-01-01 ENCOUNTER — Ambulatory Visit (HOSPITAL_COMMUNITY): Payer: Medicare Other | Attending: Orthopedic Surgery

## 2017-01-01 DIAGNOSIS — M25612 Stiffness of left shoulder, not elsewhere classified: Secondary | ICD-10-CM | POA: Diagnosis not present

## 2017-01-01 DIAGNOSIS — M25512 Pain in left shoulder: Secondary | ICD-10-CM | POA: Diagnosis not present

## 2017-01-01 DIAGNOSIS — R29898 Other symptoms and signs involving the musculoskeletal system: Secondary | ICD-10-CM | POA: Insufficient documentation

## 2017-01-01 NOTE — Therapy (Signed)
[place], [place], [address] Phone: [phone]   Fax:  [phone]  Occupational Therapy Treatment  Patient Details  Name: [name] [name] MRN: [id number] Date of Birth: 1951/06/13 Referring Provider: Dr. Justice Britain  Encounter Date: 01/01/2017      OT End of Session - 01/01/17 1143    Visit Number 5   Number of Visits 16   Date for OT Re-Evaluation 01/16/17   Authorization Type 1) medicare part A 2) [employer]/Federal employee PPO   Authorization Time Period before 10th visit   Authorization - Visit Number 5   Authorization - Number of Visits 10   OT Start Time 1040   OT Stop Time 1115   OT Time Calculation (min) 35 min   Activity Tolerance Patient tolerated treatment well   Behavior During Therapy [facility] for tasks assessed/performed      Past Medical History:  Diagnosis Date  . Anxiety   . Arthritis    back pain  . Avascular necrosis of bone of hip ([place])    right  . CAD (coronary artery disease)    stents x3 2008  . GERD (gastroesophageal reflux disease)   . History of nuclear stress test    a. Myoview 10/17: EF 56%, diaphragmatic attenuation, no ischemia, low risk  . HTN (hypertension)   . Hyperlipidemia   . Myocardial infarction   . Paresthesias     Past Surgical History:  Procedure Laterality Date  . CARDIAC CATHETERIZATION     3 stents placed  . COLONOSCOPY  2011 NUR [facility] SCREENING d50 v5   MILD [place] TICS, ? PREP ARTIFACT V. PROCTITIS-rX:[place]. next TCS 2021.  . ESOPHAGEAL DILATION N/A 03/02/2016   Procedure: ESOPHAGEAL DILATION;  Surgeon: Danie Binder, MD;  [facility];  Service: Endoscopy;  Laterality: N/A;  . ESOPHAGOGASTRODUODENOSCOPY  10/03/11   small hiatal hernia/gastric ulcers/stricutre in distal esophagus  . ESOPHAGOGASTRODUODENOSCOPY N/A 03/02/2016   Dr. Oneida Alar: Schatzki's ring at [place] junction s/p dilation, mild non-erosive gastritis/duodenitis   . MASS EXCISION Left  09/30/2014   Procedure: EXCISION MASS LEFT WRIST ;  Surgeon: Daryll Brod, MD;  Location: [place];  Service: Orthopedics;  Laterality: Left;  . TONSILLECTOMY  age 30  . TOTAL HIP ARTHROPLASTY  age 68   right hip for avascular necrosis of hip and spine    There were no vitals filed for this visit.      Subjective Assessment - 01/01/17 1055    Subjective  S: When can I start lifting weights?   Currently in Pain? No/denies            [facility] OT Assessment - 01/01/17 1056      Assessment   Diagnosis left shoulder arthroscopic surgery     Precautions   Precautions Shoulder   Type of Shoulder Precautions P/ROM to the affected shoulder for the first 4 weeks from surgery. Goal: Near full P/ROM by 3-4 weeks. At 4 weeks post-op initiate AA/ROM and scapular stabilizers. At 6 weeks post-op intiate shoulder A/ROM. Gradually advance to progressive resistive exercises.                   OT Treatments/Exercises (OP) - 01/01/17 1056      Exercises   Exercises Shoulder     Shoulder Exercises: Supine   Protraction PROM;5 reps;AROM;12 reps   Horizontal ABduction PROM;5 reps;AROM;12 reps   External Rotation PROM;5 reps;AROM;12 reps   Internal Rotation PROM;5 reps;AROM;12  reps   Flexion PROM;5 reps;AROM;12 reps   ABduction PROM;5 reps;AROM;12 reps     Shoulder Exercises: Standing   Protraction AROM;12 reps   Horizontal ABduction AROM;12 reps   External Rotation AROM;12 reps   Internal Rotation AROM;12 reps   Flexion AROM;12 reps   ABduction AROM;12 reps     Shoulder Exercises: ROM/Strengthening   X to V Arms 12X   Proximal Shoulder Strengthening, Supine 12X no rest breaks     Shoulder Exercises: Stretch   Corner Stretch 2 reps;10 seconds   Internal Rotation Stretch 2 reps  10"   Wall Stretch - Flexion 2 reps;10 seconds     Manual Therapy   Manual Therapy Myofascial release;Muscle Energy Technique   Manual therapy comments Manual therapy completed prior  to exercises   Myofascial Release Myofascial release completed to left upper arm, trapezious, and scapularis region ro decrease fascial restrictions and increase joint mobility in a pain free zone.   Muscle Energy Technique Muscle energy technique completed to left anterior deltoid to to relax tone and improve range of motion.                 OT Education - 01/01/17 1148    Education provided Yes   Education Details shoulder A/ROM and shoulder stretches   Person(s) Educated Patient   Methods Explanation;Demonstration;Verbal cues;Handout   Comprehension Returned demonstration;Verbalized understanding          OT Short Term Goals - 12/17/16 1102      OT SHORT TERM GOAL #1   Title Patient will be educated and independent with HEP to increase functional performance during daily tasks using LUE.    Time 4   Period Weeks   Status On-going     OT SHORT TERM GOAL #2   Title Patient will decrease fascial restrictions to min amount or less in LUE to increase functional mobility needed for reaching tasks.    Time 4   Period Weeks   Status On-going     OT SHORT TERM GOAL #3   Title Patient will decrease pain level when using LUE to 3/10 or less to increase ability to complete activities with LUE.   Time 4   Period Weeks   Status On-going     OT SHORT TERM GOAL #4   Title Patient will increase LUE strength to 3+/5 to increase ability to complete lightweight lifting activities above waist level.    Time 4   Period Weeks   Status On-going     OT SHORT TERM GOAL #5   Title Patient will increase P/ROM to [facility] to increase ability to complete dressing tasks with LUE.    Time 4   Period Weeks   Status On-going           OT Long Term Goals - 12/17/16 1102      OT LONG TERM GOAL #1   Title Patient will return to highest level of independence with all daily tasks using his LUE.   Time 8   Period Weeks   Status On-going     OT LONG TERM GOAL #2   Title Patient will  decrease fascial restrictions to trace amount or less to be able to complete functional reaching with less difficulty.    Time 8   Period Weeks   Status On-going     OT LONG TERM GOAL #3   Title Patient will decrease pain level to 2/10 or less when using LUE to increase comfort level during  daily tasks.    Time 8   Period Weeks   Status On-going     OT LONG TERM GOAL #4   Title Patient will increase A/ROM to WNL to increase ability to complete functional reaching tasks above shoulder level.    Time 8   Period Weeks   Status On-going     OT LONG TERM GOAL #5   Title Patient will increase LUE strength to 4/5 to be able to return to normal lifting activities with LUE.   Time 8   Period Weeks   Status On-going               Plan - 01/01/17 1147    Clinical Impression Statement A: Pt progressed to A/ROM supine and standing this session with HEP updated. patient at 9 weeks post op and is in the clear to begin A/ROM and progress as tolerated. VC for form and technique as needed.    Plan P: Add theraband scapular and shoulder exercises. Complete UBE. Follow up on updated HEP.      Patient will benefit from skilled therapeutic intervention in order to improve the following deficits and impairments:  Pain, Decreased range of motion, Impaired UE functional use, Increased fascial restricitons, Decreased strength  Visit Diagnosis: Other symptoms and signs involving the musculoskeletal system  Stiffness of left shoulder, not elsewhere classified    Problem List Patient Active Problem List   Diagnosis Date Noted  . Esophageal dysphagia 02/08/2016  . Hemorrhoids 02/08/2016  . Chronic radicular cervical pain 07/29/2014  . GERD (gastroesophageal reflux disease) 10/14/2012  . Constipation 10/14/2012  . Stiffness of joint, not elsewhere classified, other specified site 02/21/2012  . PUD (peptic ulcer disease) 12/21/2011  . Dysphagia 07/25/2011  . Hyperlipidemia 08/12/2009  .  ESSENTIAL HYPERTENSION, BENIGN 08/12/2009  . CAD, NATIVE VESSEL 08/12/2009  . CUTANEOUS ERUPTIONS, DRUG-INDUCED 01/09/2008  . LOCALIZED SUPERFICIAL SWELLING MASS OR LUMP 01/09/2008   Ailene Ravel, OTR/L,CBIS  [phone]  01/01/2017, 12:11 PM  [place] [place] [address] [place], [place], [address] Phone: [phone]   Fax:  [phone]  Name: [name] [name] MRN: [id number] Date of Birth: 24-Mar-1951

## 2017-01-01 NOTE — Patient Instructions (Addendum)
1) Shoulder Protraction    Begin with elbows by your side, slowly "punch" straight out in front of you.      2) Shoulder Flexion  Standing:         Begin with arms at your side with thumbs pointed up, slowly raise both arms up and forward towards overhead.      3) Horizontal abduction/adduction    Standing:           Begin with arms straight out in front of you, bring out to the side in at "T" shape. Keep arms straight entire time.         4) Internal & External Rotation    *No band* -Stand with elbows at the side and elbows bent 90 degrees. Move your forearms away from your body, then bring back inward toward the body.     5) Shoulder Abduction  Standing:       Lying on your back begin with your arms flat on the table next to your side. Slowly move your arms out to the side so that they go overhead, in a jumping jack or snow angel movement.    6) X to V arms (cheerleader move):  Begin with arms straight down, crossed in front of body in an "X". Keeping arms crossed, lift arms straight up overhead. Then spread arms apart into a "V" shape.  Bring back together into x and lower down to starting position.      Repeat all exercises 10-15 times, 1-2 times per day.   Doorway Stretch  Place each hand opposite each other on the doorway. (You can change where you feel the stretch by moving arms higher or lower.) Step through with one foot and bend front knee until a stretch is felt and hold. Step through with the opposite foot on the next rep. Hold for ___10__ seconds. Repeat __2__times.      WALL EXTERNAL ROTATION STRETCH - ER  Place your affected hand on the wall with the elbow bent and gently turn your body the opposite direction until a stretch is felt.  Hold for 10 seconds. Repeat 2 times.   Wall Flexion  Using a towel, slide your arm up the wall until a stretch is felt in your shoulder . Hold for 10 seconds and repeat 2 times.

## 2017-01-03 ENCOUNTER — Ambulatory Visit (HOSPITAL_COMMUNITY): Payer: Medicare Other

## 2017-01-03 ENCOUNTER — Encounter (HOSPITAL_COMMUNITY): Payer: Self-pay

## 2017-01-03 DIAGNOSIS — R29898 Other symptoms and signs involving the musculoskeletal system: Secondary | ICD-10-CM

## 2017-01-03 DIAGNOSIS — M25512 Pain in left shoulder: Secondary | ICD-10-CM

## 2017-01-03 DIAGNOSIS — M25612 Stiffness of left shoulder, not elsewhere classified: Secondary | ICD-10-CM

## 2017-01-03 NOTE — Therapy (Addendum)
[place], [place], [address] Phone: [phone]   Fax:  [phone]  Occupational Therapy Treatment  Patient Details  Name: [name] [name] MRN: [id number] Date of Birth: 11-Feb-1951 Referring Provider: Dr. Justice Britain  Encounter Date: 01/03/2017      OT End of Session - 01/03/17 1345    Visit Number 6   Number of Visits 16   Date for OT Re-Evaluation 01/16/17   Authorization Type 1) medicare part A 2) BCBS/Federal employee PPO   Authorization Time Period before 10th visit   Authorization - Visit Number 6   Authorization - Number of Visits 10   OT Start Time 1304   OT Stop Time 1345   OT Time Calculation (min) 41 min   Activity Tolerance Patient tolerated treatment well   Behavior During Therapy [facility] for tasks assessed/performed      Past Medical History:  Diagnosis Date  . Anxiety   . Arthritis    back pain  . Avascular necrosis of bone of hip ([place])    right  . CAD (coronary artery disease)    stents x3 2008  . GERD (gastroesophageal reflux disease)   . History of nuclear stress test    a. Myoview 10/17: EF 56%, diaphragmatic attenuation, no ischemia, low risk  . HTN (hypertension)   . Hyperlipidemia   . Myocardial infarction   . Paresthesias     Past Surgical History:  Procedure Laterality Date  . CARDIAC CATHETERIZATION     3 stents placed  . COLONOSCOPY  2011 NUR [facility] SCREENING d50 v5   MILD [place] TICS, ? PREP ARTIFACT V. PROCTITIS-rX:[place]. next TCS 2021.  . ESOPHAGEAL DILATION N/A 03/02/2016   Procedure: ESOPHAGEAL DILATION;  Surgeon: Danie Binder, MD;  [facility];  Service: Endoscopy;  Laterality: N/A;  . ESOPHAGOGASTRODUODENOSCOPY  10/03/11   small hiatal hernia/gastric ulcers/stricutre in distal esophagus  . ESOPHAGOGASTRODUODENOSCOPY N/A 03/02/2016   Dr. Oneida Alar: Schatzki's ring at [place] junction s/p dilation, mild non-erosive gastritis/duodenitis   . MASS EXCISION Left  09/30/2014   Procedure: EXCISION MASS LEFT WRIST ;  Surgeon: Daryll Brod, MD;  Location: [place];  Service: Orthopedics;  Laterality: Left;  . TONSILLECTOMY  age 80  . TOTAL HIP ARTHROPLASTY  age 66   right hip for avascular necrosis of hip and spine    There were no vitals filed for this visit.      Subjective Assessment - 01/03/17 1317    Subjective  S: I was a little sore but nothing bad after last session.   Currently in Pain? No/denies            [facility] OT Assessment - 01/03/17 1317      Assessment   Diagnosis left shoulder arthroscopic surgery     Precautions   Precautions Shoulder   Type of Shoulder Precautions P/ROM to the affected shoulder for the first 4 weeks from surgery. Goal: Near full P/ROM by 3-4 weeks. At 4 weeks post-op initiate AA/ROM and scapular stabilizers. At 6 weeks post-op intiate shoulder A/ROM. Gradually advance to progressive resistive exercises.                   OT Treatments/Exercises (OP) - 01/03/17 1318      Exercises   Exercises Shoulder     Shoulder Exercises: Supine   Protraction PROM;5 reps;AROM;15 reps   Horizontal ABduction PROM;5 reps;AROM;15 reps   External Rotation PROM;5 reps;AROM;15 reps  Internal Rotation PROM;5 reps;AROM;15 reps   Flexion PROM;5 reps;AROM;15 reps   ABduction PROM;10 reps;AROM;15 reps     Shoulder Exercises: Standing   Protraction AROM;15 reps   Horizontal ABduction AROM;15 reps   External Rotation AROM;15 reps   Internal Rotation AROM;15 reps   Flexion AROM;15 reps   ABduction AROM;15 reps   Extension Theraband;15 reps   Theraband Level (Shoulder Extension) Level 2 (Red)   Row Theraband;15 reps   Theraband Level (Shoulder Row) Level 2 (Red)   Retraction Theraband;15 reps   Theraband Level (Shoulder Retraction) Level 2 (Red)     Shoulder Exercises: ROM/Strengthening   UBE (Upper Arm Bike) Level 1 2' reverse 2' forward   Proximal Shoulder Strengthening, Supine 15X no rest  breaks     Manual Therapy   Manual Therapy Myofascial release   Manual therapy comments Manual therapy completed prior to exercises   Myofascial Release Myofascial release completed to left upper arm, trapezious, and scapularis region ro decrease fascial restrictions and increase joint mobility in a pain free zone.                  OT Short Term Goals - 12/17/16 1102      OT SHORT TERM GOAL #1   Title Patient will be educated and independent with HEP to increase functional performance during daily tasks using LUE.    Time 4   Period Weeks   Status On-going     OT SHORT TERM GOAL #2   Title Patient will decrease fascial restrictions to min amount or less in LUE to increase functional mobility needed for reaching tasks.    Time 4   Period Weeks   Status On-going     OT SHORT TERM GOAL #3   Title Patient will decrease pain level when using LUE to 3/10 or less to increase ability to complete activities with LUE.   Time 4   Period Weeks   Status On-going     OT SHORT TERM GOAL #4   Title Patient will increase LUE strength to 3+/5 to increase ability to complete lightweight lifting activities above waist level.    Time 4   Period Weeks   Status On-going     OT SHORT TERM GOAL #5   Title Patient will increase P/ROM to [facility] to increase ability to complete dressing tasks with LUE.    Time 4   Period Weeks   Status On-going           OT Long Term Goals - 12/17/16 1102      OT LONG TERM GOAL #1   Title Patient will return to highest level of independence with all daily tasks using his LUE.   Time 8   Period Weeks   Status On-going     OT LONG TERM GOAL #2   Title Patient will decrease fascial restrictions to trace amount or less to be able to complete functional reaching with less difficulty.    Time 8   Period Weeks   Status On-going     OT LONG TERM GOAL #3   Title Patient will decrease pain level to 2/10 or less when using LUE to increase comfort level  during daily tasks.    Time 8   Period Weeks   Status On-going     OT LONG TERM GOAL #4   Title Patient will increase A/ROM to WNL to increase ability to complete functional reaching tasks above shoulder level.    Time 8  Period Weeks   Status On-going     OT LONG TERM GOAL #5   Title Patient will increase LUE strength to 4/5 to be able to return to normal lifting activities with LUE.   Time 8   Period Weeks   Status On-going          A: Patient well during session requiring min VC for form and technique. No pain or discomfort noted during session. No fascial restrictions noted this session.     Plan - 01/03/17 1345    Plan P: Myofascial release PRN. Add scapular theraband to HEP. Add prone exercises.      Patient will benefit from skilled therapeutic intervention in order to improve the following deficits and impairments:     Visit Diagnosis: Other symptoms and signs involving the musculoskeletal system  Stiffness of left shoulder, not elsewhere classified  Acute pain of left shoulder    Problem List Patient Active Problem List   Diagnosis Date Noted  . Esophageal dysphagia 02/08/2016  . Hemorrhoids 02/08/2016  . Chronic radicular cervical pain 07/29/2014  . GERD (gastroesophageal reflux disease) 10/14/2012  . Constipation 10/14/2012  . Stiffness of joint, not elsewhere classified, other specified site 02/21/2012  . PUD (peptic ulcer disease) 12/21/2011  . Dysphagia 07/25/2011  . Hyperlipidemia 08/12/2009  . ESSENTIAL HYPERTENSION, BENIGN 08/12/2009  . CAD, NATIVE VESSEL 08/12/2009  . CUTANEOUS ERUPTIONS, DRUG-INDUCED 01/09/2008  . LOCALIZED SUPERFICIAL SWELLING MASS OR LUMP 01/09/2008   Ailene Ravel, OTR/L,CBIS  [phone]   01/03/2017, 1:48 PM  [place] [address] [place], [place], [address] Phone: [phone]   Fax:  [phone]  Name: LARNIE STANDFORD MRN: [id number] Date of Birth:  13-May-1951

## 2017-01-08 ENCOUNTER — Encounter (HOSPITAL_COMMUNITY): Payer: Self-pay

## 2017-01-08 ENCOUNTER — Ambulatory Visit (HOSPITAL_COMMUNITY): Payer: Medicare Other

## 2017-01-08 DIAGNOSIS — M25512 Pain in left shoulder: Secondary | ICD-10-CM | POA: Diagnosis not present

## 2017-01-08 DIAGNOSIS — M25612 Stiffness of left shoulder, not elsewhere classified: Secondary | ICD-10-CM

## 2017-01-08 DIAGNOSIS — R29898 Other symptoms and signs involving the musculoskeletal system: Secondary | ICD-10-CM | POA: Diagnosis not present

## 2017-01-08 NOTE — Therapy (Signed)
[place], [place], [address] Phone: [phone]   Fax:  [phone]  Occupational Therapy Treatment  Patient Details  Name: [name] [name] MRN: [id number] Date of Birth: [birth year] Referring Provider: Dr. Justice Britain  Encounter Date: 01/08/2017      OT End of Session - 01/08/17 1138    Visit Number 7   Number of Visits 16   Date for OT Re-Evaluation 01/16/17   Authorization Type 1) medicare part A 2) [employer]/Federal employee PPO   Authorization Time Period before 10th visit   Authorization - Visit Number 7   Authorization - Number of Visits 10   OT Start Time 1035   OT Stop Time 1115   OT Time Calculation (min) 40 min   Activity Tolerance Patient tolerated treatment well   Behavior During Therapy [facility] for tasks assessed/performed      Past Medical History:  Diagnosis Date  . Anxiety   . Arthritis    back pain  . Avascular necrosis of bone of hip ([place])    right  . CAD (coronary artery disease)    stents x3 2008  . GERD (gastroesophageal reflux disease)   . History of nuclear stress test    a. Myoview 10/17: EF 56%, diaphragmatic attenuation, no ischemia, low risk  . HTN (hypertension)   . Hyperlipidemia   . Myocardial infarction   . Paresthesias     Past Surgical History:  Procedure Laterality Date  . CARDIAC CATHETERIZATION     3 stents placed  . COLONOSCOPY  2011 NUR [facility] SCREENING d50 v5   MILD [place] TICS, ? PREP ARTIFACT V. PROCTITIS-rX:[place]. next TCS 2021.  . ESOPHAGEAL DILATION N/A 03/02/2016   Procedure: ESOPHAGEAL DILATION;  Surgeon: Danie Binder, MD;  [facility];  Service: Endoscopy;  Laterality: N/A;  . ESOPHAGOGASTRODUODENOSCOPY  10/03/11   small hiatal hernia/gastric ulcers/stricutre in distal esophagus  . ESOPHAGOGASTRODUODENOSCOPY N/A 03/02/2016   Dr. Oneida Alar: Schatzki's ring at [place] junction s/p dilation, mild non-erosive gastritis/duodenitis   . MASS EXCISION Left  09/30/2014   Procedure: EXCISION MASS LEFT WRIST ;  Surgeon: Daryll Brod, MD;  Location: [place];  Service: Orthopedics;  Laterality: Left;  . TONSILLECTOMY  age 66  . TOTAL HIP ARTHROPLASTY  age 66   right hip for avascular necrosis of hip and spine    There were no vitals filed for this visit.      Subjective Assessment - 01/08/17 1049    Subjective  S: I think I slep on it wrong because it's just a little sore right now.    Currently in Pain? Yes   Pain Score 2    Pain Location Shoulder   Pain Orientation Left   Pain Descriptors / Indicators Sore   Pain Type Acute pain   Pain Radiating Towards N/A   Pain Onset Yesterday   Pain Frequency Intermittent   Aggravating Factors  sleeping on it   Pain Relieving Factors Movement   Effect of Pain on Daily Activities None   Multiple Pain Sites No                      OT Treatments/Exercises (OP) - 01/08/17 1052      Exercises   Exercises Shoulder     Shoulder Exercises: Supine   Protraction PROM;5 reps   Horizontal ABduction PROM;5 reps   External Rotation PROM;5 reps   Internal Rotation PROM;5 reps  Flexion PROM;5 reps   ABduction PROM;5 reps     Shoulder Exercises: Prone   Other Prone Exercises [place] exercises; 10X     Shoulder Exercises: Standing   Protraction AROM;15 reps   Horizontal ABduction AROM;15 reps   External Rotation AROM;15 reps   Internal Rotation AROM;15 reps   Flexion AROM;15 reps   ABduction AROM;15 reps   Extension Theraband;15 reps   Theraband Level (Shoulder Extension) Level 2 (Red)   Row Theraband;15 reps   Theraband Level (Shoulder Row) Level 2 (Red)   Retraction Theraband;15 reps   Theraband Level (Shoulder Retraction) Level 2 (Red)     Shoulder Exercises: ROM/Strengthening   UBE (Upper Arm Bike) Level 1 2' reverse 2' forward   X to V Arms 12X     Manual Therapy   Manual Therapy Myofascial release   Manual therapy comments Manual therapy completed  prior to exercises   Myofascial Release Myofascial release completed to left upper arm, trapezious, and scapularis region ro decrease fascial restrictions and increase joint mobility in a pain free zone.                OT Education - 01/08/17 1057    Education provided Yes   Education Details scapular theraband exercises - red   Person(s) Educated Patient   Methods Explanation;Demonstration;Handout;Verbal cues   Comprehension Returned demonstration;Verbalized understanding          OT Short Term Goals - 12/17/16 1102      OT SHORT TERM GOAL #1   Title Patient will be educated and independent with HEP to increase functional performance during daily tasks using LUE.    Time 4   Period Weeks   Status On-going     OT SHORT TERM GOAL #2   Title Patient will decrease fascial restrictions to min amount or less in LUE to increase functional mobility needed for reaching tasks.    Time 4   Period Weeks   Status On-going     OT SHORT TERM GOAL #3   Title Patient will decrease pain level when using LUE to 3/10 or less to increase ability to complete activities with LUE.   Time 4   Period Weeks   Status On-going     OT SHORT TERM GOAL #4   Title Patient will increase LUE strength to 3+/5 to increase ability to complete lightweight lifting activities above waist level.    Time 4   Period Weeks   Status On-going     OT SHORT TERM GOAL #5   Title Patient will increase P/ROM to [facility] to increase ability to complete dressing tasks with LUE.    Time 4   Period Weeks   Status On-going           OT Long Term Goals - 12/17/16 1102      OT LONG TERM GOAL #1   Title Patient will return to highest level of independence with all daily tasks using his LUE.   Time 8   Period Weeks   Status On-going     OT LONG TERM GOAL #2   Title Patient will decrease fascial restrictions to trace amount or less to be able to complete functional reaching with less difficulty.    Time 8    Period Weeks   Status On-going     OT LONG TERM GOAL #3   Title Patient will decrease pain level to 2/10 or less when using LUE to increase comfort level during daily tasks.  Time 8   Period Weeks   Status On-going     OT LONG TERM GOAL #4   Title Patient will increase A/ROM to WNL to increase ability to complete functional reaching tasks above shoulder level.    Time 8   Period Weeks   Status On-going     OT LONG TERM GOAL #5   Title Patient will increase LUE strength to 4/5 to be able to return to normal lifting activities with LUE.   Time 8   Period Weeks   Status On-going               Plan - 01/08/17 1138    Clinical Impression Statement A: Added scapular theraband to HEP and added prone exercises to continue working on scapular strength and stability. VC for form and technique.   Plan P: Myofascial release PRN. Follow up on scapular theraband to HEP. Continue to work on increasing flexion of right shoulder. Add functional reaching task to next session.   OT Home Exercise Plan 12;18: AA/ROM exercises. Pt was instructed to stop previous exercises. 1/9: patient was given scapular theraband to add to HEP.      Patient will benefit from skilled therapeutic intervention in order to improve the following deficits and impairments:  Pain, Decreased range of motion, Impaired UE functional use, Increased fascial restricitons, Decreased strength  Visit Diagnosis: Other symptoms and signs involving the musculoskeletal system  Acute pain of left shoulder  Stiffness of left shoulder, not elsewhere classified    Problem List Patient Active Problem List   Diagnosis Date Noted  . Esophageal dysphagia 02/08/2016  . Hemorrhoids 02/08/2016  . Chronic radicular cervical pain 07/29/2014  . GERD (gastroesophageal reflux disease) 10/14/2012  . Constipation 10/14/2012  . Stiffness of joint, not elsewhere classified, other specified site 02/21/2012  . PUD (peptic ulcer disease)  12/21/2011  . Dysphagia 07/25/2011  . Hyperlipidemia 08/12/2009  . ESSENTIAL HYPERTENSION, BENIGN 08/12/2009  . CAD, NATIVE VESSEL 08/12/2009  . CUTANEOUS ERUPTIONS, DRUG-INDUCED 01/09/2008  . LOCALIZED SUPERFICIAL SWELLING MASS OR LUMP 01/09/2008   Ailene Ravel, OTR/L,CBIS  [phone]  01/08/2017, 11:48 AM  [place] [address] [place], [place], [address] Phone: [phone]   Fax:  [phone]  Name: [name] [name] MRN: [id number] Date of Birth: 12-02-1951

## 2017-01-08 NOTE — Patient Instructions (Signed)

## 2017-01-10 ENCOUNTER — Ambulatory Visit (HOSPITAL_COMMUNITY): Payer: Medicare Other | Admitting: Occupational Therapy

## 2017-01-10 ENCOUNTER — Encounter (HOSPITAL_COMMUNITY): Payer: Self-pay | Admitting: Occupational Therapy

## 2017-01-10 DIAGNOSIS — M25512 Pain in left shoulder: Secondary | ICD-10-CM

## 2017-01-10 DIAGNOSIS — R29898 Other symptoms and signs involving the musculoskeletal system: Secondary | ICD-10-CM

## 2017-01-10 DIAGNOSIS — M25612 Stiffness of left shoulder, not elsewhere classified: Secondary | ICD-10-CM

## 2017-01-10 NOTE — Therapy (Signed)
[place], [place], [address] Phone: [phone]   Fax:  [phone]  Occupational Therapy Treatment  Patient Details  Name: [name] [name] MRN: [id number] Date of Birth: 1951/12/16 Referring Provider: Dr. Justice Britain  Encounter Date: 01/10/2017      OT End of Session - 01/10/17 1202    Visit Number 8   Number of Visits 16   Date for OT Re-Evaluation 01/16/17   Authorization Type 1) medicare part A 2) BCBS/Federal employee PPO   Authorization Time Period before 10th visit   Authorization - Visit Number 8   Authorization - Number of Visits 10   OT Start Time 1036   OT Stop Time 1117   OT Time Calculation (min) 41 min   Activity Tolerance Patient tolerated treatment well   Behavior During Therapy [facility] for tasks assessed/performed      Past Medical History:  Diagnosis Date  . Anxiety   . Arthritis    back pain  . Avascular necrosis of bone of hip ([place])    right  . CAD (coronary artery disease)    stents x3 2008  . GERD (gastroesophageal reflux disease)   . History of nuclear stress test    a. Myoview 10/17: EF 56%, diaphragmatic attenuation, no ischemia, low risk  . HTN (hypertension)   . Hyperlipidemia   . Myocardial infarction   . Paresthesias     Past Surgical History:  Procedure Laterality Date  . CARDIAC CATHETERIZATION     3 stents placed  . COLONOSCOPY  2011 NUR [facility] SCREENING d50 v5   MILD [place] TICS, ? PREP ARTIFACT V. PROCTITIS-rX:[place]. next TCS 2021.  . ESOPHAGEAL DILATION N/A 03/02/2016   Procedure: ESOPHAGEAL DILATION;  Surgeon: Danie Binder, MD;  [facility];  Service: Endoscopy;  Laterality: N/A;  . ESOPHAGOGASTRODUODENOSCOPY  10/03/11   small hiatal hernia/gastric ulcers/stricutre in distal esophagus  . ESOPHAGOGASTRODUODENOSCOPY N/A 03/02/2016   Dr. Oneida Alar: Schatzki's ring at [place] junction s/p dilation, mild non-erosive gastritis/duodenitis   . MASS EXCISION Left  09/30/2014   Procedure: EXCISION MASS LEFT WRIST ;  Surgeon: Daryll Brod, MD;  Location: [place];  Service: Orthopedics;  Laterality: Left;  . TONSILLECTOMY  age 66  . TOTAL HIP ARTHROPLASTY  age 64   right hip for avascular necrosis of hip and spine    There were no vitals filed for this visit.      Subjective Assessment - 01/10/17 1038    Subjective  S:    Currently in Pain? No/denies            [facility] OT Assessment - 01/10/17 1038      Assessment   Diagnosis left shoulder arthroscopic surgery     Precautions   Precautions Shoulder   Type of Shoulder Precautions P/ROM to the affected shoulder for the first 4 weeks from surgery. Goal: Near full P/ROM by 3-4 weeks. At 4 weeks post-op initiate AA/ROM and scapular stabilizers. At 6 weeks post-op intiate shoulder A/ROM. Gradually advance to progressive resistive exercises.                   OT Treatments/Exercises (OP) - 01/10/17 1039      Exercises   Exercises Shoulder     Shoulder Exercises: Supine   Protraction PROM;5 reps;AROM;15 reps   Horizontal ABduction PROM;5 reps;AROM;15 reps   External Rotation PROM;5 reps;AROM;15 reps   Internal Rotation PROM;5 reps;AROM;15 reps   Flexion PROM;5  reps;AROM;15 reps   ABduction PROM;5 reps;AROM;15 reps     Shoulder Exercises: Prone   Other Prone Exercises [place] exercises; 10X     Shoulder Exercises: Standing   Protraction AROM;15 reps   Horizontal ABduction AROM;15 reps   External Rotation AROM;15 reps   Internal Rotation AROM;15 reps   Flexion AROM;15 reps   ABduction AROM;15 reps     Shoulder Exercises: ROM/Strengthening   X to V Arms 15X   Proximal Shoulder Strengthening, Supine 15X no rest breaks   Proximal Shoulder Strengthening, Seated 15X each no rest breaks   Rhythmic Stabilization, Supine 25x at 45, 90, 120 degrees, mod difficulty     Shoulder Exercises: Stretch   Wall Stretch - Flexion 2 reps;10 seconds     Functional Reaching  Activities   High Level Pt placed and removed 10 cones from top of cabinets in treatment bay. Min/mod difficulty                  OT Short Term Goals - 12/17/16 1102      OT SHORT TERM GOAL #1   Title Patient will be educated and independent with HEP to increase functional performance during daily tasks using LUE.    Time 4   Period Weeks   Status On-going     OT SHORT TERM GOAL #2   Title Patient will decrease fascial restrictions to min amount or less in LUE to increase functional mobility needed for reaching tasks.    Time 4   Period Weeks   Status On-going     OT SHORT TERM GOAL #3   Title Patient will decrease pain level when using LUE to 3/10 or less to increase ability to complete activities with LUE.   Time 4   Period Weeks   Status On-going     OT SHORT TERM GOAL #4   Title Patient will increase LUE strength to 3+/5 to increase ability to complete lightweight lifting activities above waist level.    Time 4   Period Weeks   Status On-going     OT SHORT TERM GOAL #5   Title Patient will increase P/ROM to [facility] to increase ability to complete dressing tasks with LUE.    Time 4   Period Weeks   Status On-going           OT Long Term Goals - 12/17/16 1102      OT LONG TERM GOAL #1   Title Patient will return to highest level of independence with all daily tasks using his LUE.   Time 8   Period Weeks   Status On-going     OT LONG TERM GOAL #2   Title Patient will decrease fascial restrictions to trace amount or less to be able to complete functional reaching with less difficulty.    Time 8   Period Weeks   Status On-going     OT LONG TERM GOAL #3   Title Patient will decrease pain level to 2/10 or less when using LUE to increase comfort level during daily tasks.    Time 8   Period Weeks   Status On-going     OT LONG TERM GOAL #4   Title Patient will increase A/ROM to WNL to increase ability to complete functional reaching tasks above shoulder  level.    Time 8   Period Weeks   Status On-going     OT LONG TERM GOAL #5   Title Patient will increase LUE strength to  4/5 to be able to return to normal lifting activities with LUE.   Time 8   Period Weeks   Status On-going               Plan - 01/10/17 1202    Clinical Impression Statement A: Continued with A/ROM, adding functional reaching task this session. Also added rhythmic shoulder stabilization in supine. Verbal cuing required for form and technqiue during exercises, fatigue noted during prone hughston exercises.   Plan P: Update G-Code. Resume scapular theraband, add ball on wall for scapular strengthening   OT Home Exercise Plan 12;18: AA/ROM exercises. Pt was instructed to stop previous exercises. 1/9: patient was given scapular theraband to add to HEP.   Consulted and Agree with Plan of Care Patient      Patient will benefit from skilled therapeutic intervention in order to improve the following deficits and impairments:  Pain, Decreased range of motion, Impaired UE functional use, Increased fascial restricitons, Decreased strength  Visit Diagnosis: Other symptoms and signs involving the musculoskeletal system  Acute pain of left shoulder  Stiffness of left shoulder, not elsewhere classified    Problem List Patient Active Problem List   Diagnosis Date Noted  . Esophageal dysphagia 02/08/2016  . Hemorrhoids 02/08/2016  . Chronic radicular cervical pain 07/29/2014  . GERD (gastroesophageal reflux disease) 10/14/2012  . Constipation 10/14/2012  . Stiffness of joint, not elsewhere classified, other specified site 02/21/2012  . PUD (peptic ulcer disease) 12/21/2011  . Dysphagia 07/25/2011  . Hyperlipidemia 08/12/2009  . ESSENTIAL HYPERTENSION, BENIGN 08/12/2009  . CAD, NATIVE VESSEL 08/12/2009  . CUTANEOUS ERUPTIONS, DRUG-INDUCED 01/09/2008  . LOCALIZED SUPERFICIAL SWELLING MASS OR LUMP 01/09/2008   Guadelupe Sabin, OTR/L  [phone] 01/10/2017,  12:05 PM  [place] [address] [place], [place], [address] Phone: [phone]   Fax:  [phone]  Name: [name] [name] MRN: [id number] Date of Birth: 26-Oct-1951

## 2017-01-15 ENCOUNTER — Encounter (HOSPITAL_COMMUNITY): Payer: Self-pay

## 2017-01-15 ENCOUNTER — Ambulatory Visit (HOSPITAL_COMMUNITY): Payer: Medicare Other

## 2017-01-15 DIAGNOSIS — R29898 Other symptoms and signs involving the musculoskeletal system: Secondary | ICD-10-CM

## 2017-01-15 DIAGNOSIS — M25612 Stiffness of left shoulder, not elsewhere classified: Secondary | ICD-10-CM

## 2017-01-15 DIAGNOSIS — M25512 Pain in left shoulder: Secondary | ICD-10-CM | POA: Diagnosis not present

## 2017-01-15 NOTE — Patient Instructions (Signed)
  Wall Flexion  Using a towel, slide your arm up the wall until a stretch is felt in your shoulder . Hold for 10 seconds and repeat 2 times.     Shoulder Abduction Stretch  Stand side ways by a wall with affected up on wall. Gently lean toward wall to feel stretch.   Hold for 10 seconds. Repeat 2 times.

## 2017-01-15 NOTE — Therapy (Addendum)
[place], [place], [address] Phone: [phone]   Fax:  [phone]  Occupational Therapy Treatment And reassessment Patient Details  Name: TABIAS SWAYZE MRN: [id number] Date of Birth: [birth year] Referring Provider: Dr. Justice Britain  Encounter Date: 01/15/2017      OT End of Session - 01/15/17 1113    Visit Number 9   Number of Visits 16   Date for OT Re-Evaluation 01/16/17   Authorization Type 1) medicare part A 2) BCBS/Federal employee PPO   Authorization Time Period before 19th visit   Authorization - Visit Number 9   Authorization - Number of Visits 19   OT Start Time 1037   OT Stop Time 1115   OT Time Calculation (min) 38 min   Activity Tolerance Patient tolerated treatment well   Behavior During Therapy [facility] for tasks assessed/performed      Past Medical History:  Diagnosis Date  . Anxiety   . Arthritis    back pain  . Avascular necrosis of bone of hip ([place])    right  . CAD (coronary artery disease)    stents x3 2008  . GERD (gastroesophageal reflux disease)   . History of nuclear stress test    a. Myoview 10/17: EF 56%, diaphragmatic attenuation, no ischemia, low risk  . HTN (hypertension)   . Hyperlipidemia   . Myocardial infarction   . Paresthesias     Past Surgical History:  Procedure Laterality Date  . CARDIAC CATHETERIZATION     3 stents placed  . COLONOSCOPY  2011 NUR [facility] SCREENING d50 v5   MILD [place] TICS, ? PREP ARTIFACT V. PROCTITIS-rX:[place]. next TCS 2021.  . ESOPHAGEAL DILATION N/A 03/02/2016   Procedure: ESOPHAGEAL DILATION;  Surgeon: Danie Binder, MD;  [facility];  Service: Endoscopy;  Laterality: N/A;  . ESOPHAGOGASTRODUODENOSCOPY  10/03/11   small hiatal hernia/gastric ulcers/stricutre in distal esophagus  . ESOPHAGOGASTRODUODENOSCOPY N/A 03/02/2016   Dr. Oneida Alar: Schatzki's ring at [place] junction s/p dilation, mild non-erosive gastritis/duodenitis   . MASS  EXCISION Left 09/30/2014   Procedure: EXCISION MASS LEFT WRIST ;  Surgeon: Daryll Brod, MD;  Location: [facility];  Service: Orthopedics;  Laterality: Left;  . TONSILLECTOMY  age 59  . TOTAL HIP ARTHROPLASTY  age 66   right hip for avascular necrosis of hip and spine    There were no vitals filed for this visit.      Subjective Assessment - 01/15/17 1057    Subjective  S: I tend to favor my left arm so since I'm right handed I use my right for most everything.    Currently in Pain? Yes   Pain Score 1    Pain Location Shoulder   Pain Orientation Left   Pain Descriptors / Indicators Sore   Pain Type Acute pain   Pain Radiating Towards N/A   Pain Onset Yesterday   Pain Frequency Rarely   Aggravating Factors  sleeping on it   Pain Relieving Factors movement   Effect of Pain on Daily Activities None   Multiple Pain Sites No            [facility] OT Assessment - 01/15/17 1043      Assessment   Diagnosis left shoulder arthroscopic surgery     Precautions   Precautions Shoulder   Type of Shoulder Precautions Progress as tolerated     AROM   Overall AROM Comments Assessed seated. IR/er adducted.  AROM Assessment Site Shoulder   Right/Left Shoulder Left   Left Shoulder Flexion 125 Degrees  previous: 71   Left Shoulder ABduction 125 Degrees  previous: 71   Left Shoulder Internal Rotation 90 Degrees  previous: same   Left Shoulder External Rotation 58 Degrees  previous: 28     PROM   Overall PROM Comments Assessed supine. IR/er adducted   PROM Assessment Site Shoulder   Right/Left Shoulder Left   Left Shoulder Flexion 150 Degrees  previous: 120   Left Shoulder ABduction 130 Degrees  previous: 115   Left Shoulder Internal Rotation 90 Degrees  previous: same   Left Shoulder External Rotation 70 Degrees  prevoius: 60     Strength   Overall Strength Comments Assessed seated. IR/er adducted   Strength Assessment Site Shoulder   Right/Left Shoulder Left    Left Shoulder Flexion 5/5  previous: 3-/5   Left Shoulder ABduction 3+/5  previous; 3-/5   Left Shoulder Internal Rotation 5/5  previous: 3/5   Left Shoulder External Rotation 3+/5  previous: 3-/5                  OT Treatments/Exercises (OP) - 01/15/17 1109      Exercises   Exercises Shoulder     Shoulder Exercises: Supine   Protraction PROM;5 reps;Strengthening;12 reps   Protraction Weight (lbs) 1   Horizontal ABduction PROM;5 reps;Strengthening;12 reps   Horizontal ABduction Weight (lbs) 1   External Rotation PROM;5 reps;Strengthening;12 reps   External Rotation Weight (lbs) 1   Internal Rotation PROM;5 reps;Strengthening;12 reps   Internal Rotation Weight (lbs) 1   Flexion PROM;5 reps;Strengthening;12 reps   Shoulder Flexion Weight (lbs) 1   ABduction PROM;5 reps;Strengthening;12 reps   Shoulder ABduction Weight (lbs) 1     Shoulder Exercises: Standing   Protraction Strengthening;12 reps   Protraction Weight (lbs) 1   Horizontal ABduction Strengthening;12 reps   Horizontal ABduction Weight (lbs) 1   External Rotation Strengthening;12 reps   External Rotation Weight (lbs) 1   Internal Rotation Strengthening;12 reps   Internal Rotation Weight (lbs) 1   Flexion Strengthening;12 reps   Shoulder Flexion Weight (lbs) 1   ABduction Strengthening;12 reps   Shoulder ABduction Weight (lbs) 1     Shoulder Exercises: Stretch   Wall Stretch - Flexion 2 reps;10 seconds   Wall Stretch - ABduction 2 reps;10 seconds                OT Education - 01/15/17 1157    Education provided Yes   Education Details shoulder flexion and abduction stretch.   Person(s) Educated Patient   Methods Explanation;Demonstration;Verbal cues;Handout   Comprehension Verbalized understanding;Returned demonstration          OT Short Term Goals - 01/15/17 1112      OT SHORT TERM GOAL #1   Title Patient will be educated and independent with HEP to increase functional  performance during daily tasks using LUE.    Time 4   Period Weeks   Status Achieved     OT SHORT TERM GOAL #2   Title Patient will decrease fascial restrictions to min amount or less in LUE to increase functional mobility needed for reaching tasks.    Time 4   Period Weeks   Status Achieved     OT SHORT TERM GOAL #3   Title Patient will decrease pain level when using LUE to 3/10 or less to increase ability to complete activities with LUE.   Time 4  Period Weeks   Status Achieved     OT SHORT TERM GOAL #4   Title Patient will increase LUE strength to 3+/5 to increase ability to complete lightweight lifting activities above waist level.    Time 4   Period Weeks   Status Achieved     OT SHORT TERM GOAL #5   Title Patient will increase P/ROM to [facility] to increase ability to complete dressing tasks with LUE.    Time 4   Period Weeks   Status Achieved           OT Long Term Goals - 01/24/17 1112      OT LONG TERM GOAL #1   Title Patient will return to highest level of independence with all daily tasks using his LUE.   Time 8   Period Weeks   Status On-going     OT LONG TERM GOAL #2   Title Patient will decrease fascial restrictions to trace amount or less to be able to complete functional reaching with less difficulty.    Time 8   Period Weeks   Status Achieved     OT LONG TERM GOAL #3   Title Patient will decrease pain level to 2/10 or less when using LUE to increase comfort level during daily tasks.    Time 8   Period Weeks   Status Achieved     OT LONG TERM GOAL #4   Title Patient will increase A/ROM to WNL to increase ability to complete functional reaching tasks above shoulder level.    Time 8   Period Weeks   Status On-going     OT LONG TERM GOAL #5   Title Patient will increase LUE strength to 4/5 to be able to return to normal lifting activities with LUE.   Time 8   Period Weeks   Status Partially Met               Plan - 2017/01/24 1113     Clinical Impression Statement A: Updated G code this session. Pt reports that he has not returned to lifting anything heavy. He tends to use his right arm for most everything as he is right handed so he does not notice the deficits in his arm as much. During his reassessment, patient has shown improvement in the past 4 weeks with his Active and passive ROM as well as his strength. Pt continues to have some deficits with ROM and strength. Pt was given shoulder stretches this session to add to HEP.   Plan P: D/C next session with updated HEP per patient.   OT Home Exercise Plan 12;18: AA/ROM exercises. Pt was instructed to stop previous exercises. 1/9: patient was given scapular theraband to add to HEP. 2023-01-24: Added shoulder stretches: flexion and abduction      Patient will benefit from skilled therapeutic intervention in order to improve the following deficits and impairments:  Pain, Decreased range of motion, Impaired UE functional use, Increased fascial restricitons, Decreased strength  Visit Diagnosis: Other symptoms and signs involving the musculoskeletal system  Acute pain of left shoulder  Stiffness of left shoulder, not elsewhere classified      G-Codes - Jan 24, 2017 03/07/06    Functional Assessment Tool Used FOTO score: 63/100 (37% impaired)   Functional Limitation Carrying, moving and handling objects   Carrying, Moving and Handling Objects Current Status (M0947) At least 20 percent but less than 40 percent impaired, limited or restricted   Carrying, Moving and Handling Objects Goal  Status [phone]) At least 20 percent but less than 40 percent impaired, limited or restricted      Problem List Patient Active Problem List   Diagnosis Date Noted  . Esophageal dysphagia 02/08/2016  . Hemorrhoids 02/08/2016  . Chronic radicular cervical pain 07/29/2014  . GERD (gastroesophageal reflux disease) 10/14/2012  . Constipation 10/14/2012  . Stiffness of joint, not elsewhere classified, other  specified site 02/21/2012  . PUD (peptic ulcer disease) 12/21/2011  . Dysphagia 07/25/2011  . Hyperlipidemia 08/12/2009  . ESSENTIAL HYPERTENSION, BENIGN 08/12/2009  . CAD, NATIVE VESSEL 08/12/2009  . CUTANEOUS ERUPTIONS, DRUG-INDUCED 01/09/2008  . LOCALIZED SUPERFICIAL SWELLING MASS OR LUMP 01/09/2008   Ailene Ravel, OTR/L,CBIS  [phone]  01/15/2017, 12:08 PM  [facility] [address] [place], [place], [address] Phone: [phone]   Fax:  [phone]  Name: CASPIAN DELEONARDIS MRN: [id number] Date of Birth: 09/13/1951   OCCUPATIONAL THERAPY DISCHARGE SUMMARY (07/22/17)  Visits from Start of Care: 9  Current functional level related to goals / functional outcomes: See above   Remaining deficits: See above   Education / Equipment: See above Plan: Patient agrees to discharge.  Patient goals were partially met. Patient is being discharged due to the patient's request.  ?????         Patient requesting to be discharged from OT services at this time.   Ailene Ravel, OTR/L,CBIS  [phone]

## 2017-01-17 ENCOUNTER — Encounter (HOSPITAL_COMMUNITY): Payer: Medicare Other

## 2017-01-18 ENCOUNTER — Ambulatory Visit (INDEPENDENT_AMBULATORY_CARE_PROVIDER_SITE_OTHER): Payer: Medicare Other | Admitting: Internal Medicine

## 2017-01-18 ENCOUNTER — Telehealth (HOSPITAL_COMMUNITY): Payer: Self-pay | Admitting: Family Medicine

## 2017-01-18 ENCOUNTER — Encounter: Payer: Self-pay | Admitting: Internal Medicine

## 2017-01-18 VITALS — BP 136/82 | HR 74 | Ht 72.0 in | Wt 182.0 lb

## 2017-01-18 DIAGNOSIS — I25810 Atherosclerosis of coronary artery bypass graft(s) without angina pectoris: Secondary | ICD-10-CM

## 2017-01-18 DIAGNOSIS — Z4789 Encounter for other orthopedic aftercare: Secondary | ICD-10-CM | POA: Diagnosis not present

## 2017-01-18 DIAGNOSIS — E785 Hyperlipidemia, unspecified: Secondary | ICD-10-CM

## 2017-01-18 DIAGNOSIS — S46012D Strain of muscle(s) and tendon(s) of the rotator cuff of left shoulder, subsequent encounter: Secondary | ICD-10-CM | POA: Diagnosis not present

## 2017-01-18 NOTE — Patient Instructions (Signed)
Your physician recommends that you continue on your current medications as directed. Please refer to the Current Medication list given to you today. Your physician wants you to follow-up in: 1 year with Dr. Ross.  You will receive a reminder letter in the mail two months in advance. If you don't receive a letter, please call our office to schedule the follow-up appointment.  

## 2017-01-18 NOTE — Telephone Encounter (Signed)
01/18/17  Pt called and wanted to make sure that Dr. Onnie Graham had notes from [place] for his visit today.  He was scheduled for 1/18 and it was supposed to be his last appt. DC and I asked him if he wanted to reschedule and he said to just wait until he sees Dr. Onnie Graham today and he will call us back.

## 2017-01-18 NOTE — Progress Notes (Signed)
Cardiology Office Note   Date:  01/18/2017   ID:  [name] [name], DOB 19-May-1951, MRN [id number]  PCP:  Purvis Kilts, MD  Cardiologist:   Dorris Carnes, MD    F/U of CAD     History of Present Illness: [name] [name] is a 66 y.o. male with a history ofCAD s/p NSTEMI in 2008 tx with PCI of bifurcational lesion in LAD/Dx with 3 DES, HL, HTN.  He has a hx of rash with Plavix and Ticlid.  He underwent Plavix desensitization in 2009 I saw the pt in Jan  He was seen by Kathleen Argue in October  At that time complained of a couple episodes of brief chest pressure    Myovue showed no ischemia    Breathing  Good  Walks daily   No CP  Chest pressure prob from refux Had rotator cuff surgery   Current Meds  Medication Sig  . aspirin EC 81 MG tablet Take 81 mg by mouth daily.  . calcium carbonate (TUMS EX) 750 MG chewable tablet Chew 1 tablet by mouth 3 (three) times daily as needed for heartburn.   . Coenzyme Q10 200 MG capsule Take 200 mg by mouth daily.  Marland Kitchen ezetimibe (ZETIA) 10 MG tablet Take 10 mg by mouth daily.  . famotidine (PEPCID) 20 MG tablet Take 1 tablet (20 mg total) by mouth 2 (two) times daily.  Marland Kitchen levothyroxine (SYNTHROID, LEVOTHROID) 50 MCG tablet Take 50 mcg by mouth daily.   Marland Kitchen losartan-hydrochlorothiazide (HYZAAR) 100-25 MG tablet Take 0.5 tablets by mouth 2 (two) times daily.  . nitroGLYCERIN (NITROSTAT) 0.4 MG SL tablet Place 0.4 mg under the tongue every 5 (five) minutes as needed for chest pain. X 3 doses  . pravastatin (PRAVACHOL) 40 MG tablet TAKE ONE TABLET BY MOUTH ONCE DAILY IN THE EVENING  . Saw Palmetto, Serenoa repens, (SAW PALMETTO PO) Take 1 tablet by mouth daily. PATIENT NOT SURE OF THE DOSE  . [DISCONTINUED] nitroGLYCERIN (NITROSTAT) 0.4 MG SL tablet Place 1 tablet (0.4 mg total) under the tongue every 5 (five) minutes as needed. For chest pains     Allergies:   Other; Bee venom; Clopidogrel bisulfate; Crestor [rosuvastatin]; Lipitor [atorvastatin];  Penicillin g; Penicillins; and Clopidogrel   Past Medical History:  Diagnosis Date  . Anxiety   . Arthritis    back pain  . Avascular necrosis of bone of hip ([place])    right  . CAD (coronary artery disease)    stents x3 2008  . GERD (gastroesophageal reflux disease)   . History of nuclear stress test    a. Myoview 10/17: EF 56%, diaphragmatic attenuation, no ischemia, low risk  . HTN (hypertension)   . Hyperlipidemia   . Myocardial infarction   . Paresthesias     Past Surgical History:  Procedure Laterality Date  . CARDIAC CATHETERIZATION     3 stents placed  . COLONOSCOPY  2011 NUR [facility] SCREENING d50 v5   MILD [place] TICS, ? PREP ARTIFACT V. PROCTITIS-rX:[place]. next TCS 2021.  . ESOPHAGEAL DILATION N/A 03/02/2016   Procedure: ESOPHAGEAL DILATION;  Surgeon: Danie Binder, MD;  [facility];  Service: Endoscopy;  Laterality: N/A;  . ESOPHAGOGASTRODUODENOSCOPY  10/03/11   small hiatal hernia/gastric ulcers/stricutre in distal esophagus  . ESOPHAGOGASTRODUODENOSCOPY N/A 03/02/2016   Dr. Oneida Alar: Schatzki's ring at [place] junction s/p dilation, mild non-erosive gastritis/duodenitis   . MASS EXCISION Left 09/30/2014   Procedure: EXCISION MASS LEFT WRIST ;  Surgeon: Dominica Severin  Fredna Dow, MD;  Location: [facility];  Service: Orthopedics;  Laterality: Left;  . TONSILLECTOMY  age 50  . TOTAL HIP ARTHROPLASTY  age 41   right hip for avascular necrosis of hip and spine     Social History:  The patient  reports that he quit smoking about 9 years ago. His smoking use included Cigarettes. He has a 30.00 pack-year smoking history. He has never used smokeless tobacco. He reports that he does not drink alcohol or use drugs.   Family History:  The patient's family history includes Dementia in his mother; Lung cancer (age of onset: 80) in his sister; Prostate cancer (age of onset: 39) in his father.    ROS:  Please see the history of present illness. All other systems are reviewed and   Negative to the above problem except as noted.    PHYSICAL EXAM: VS:  BP 136/82   Pulse 74   Ht 6' (1.829 m)   Wt 182 lb (82.6 kg)   BMI 24.68 kg/m   GEN: Well nourished, well developed, in no acute distress  HEENT: normal  Neck: no JVD, carotid bruits, or masses Cardiac: RRR; no murmurs, rubs, or gallops,no edema  Respiratory:  clear to auscultation bilaterally, normal work of breathing GI: soft, nontender, nondistended, + BS  No hepatomegaly  MS: no deformity Moving all extremities   Skin: warm and dry, no rash Neuro:  Strength and sensation are intact Psych: euthymic mood, full affect   EKG:  EKG is not ordered today.   Lipid Panel    Component Value Date/Time   CHOL 125 01/30/2016 1005   TRIG 119 01/30/2016 1005   TRIG 107 08/15/2009 1335   HDL 28 (L) 01/30/2016 1005   CHOLHDL 4.5 01/30/2016 1005   VLDL 24 01/30/2016 1005   LDLCALC 73 01/30/2016 1005   LDLCALC 87 08/15/2009 1335      Wt Readings from Last 3 Encounters:  01/18/17 182 lb (82.6 kg)  10/10/16 177 lb (80.3 kg)  10/03/16 177 lb 12.8 oz (80.6 kg)      ASSESSMENT AND PLAN:  1  CAD  No symptoms to sugg ischemia  DOing good  Keep on same regine   2  HTN  BP is fair  Keep on same meds  3  HL   Will review lipids with lipid clinic  LDL was 89  He has not tolerated other statins   Will review to see if candidate for Repatha     Current medicines are reviewed at length with the patient today.  The patient does not have concerns regarding medicines.  Signed, Dorris Carnes, MD  01/18/2017 11:58 AM    [place], [place], [place]  [address] Phone: [phone]; Fax: [phone]

## 2017-01-22 NOTE — Progress Notes (Signed)
Please see if Repatha approved then refer to lipid clinic  Otherwise will refer to research

## 2017-01-24 DIAGNOSIS — E782 Mixed hyperlipidemia: Secondary | ICD-10-CM | POA: Diagnosis not present

## 2017-01-24 DIAGNOSIS — E663 Overweight: Secondary | ICD-10-CM | POA: Diagnosis not present

## 2017-01-24 DIAGNOSIS — M791 Myalgia: Secondary | ICD-10-CM | POA: Diagnosis not present

## 2017-01-24 DIAGNOSIS — Z1389 Encounter for screening for other disorder: Secondary | ICD-10-CM | POA: Diagnosis not present

## 2017-01-24 DIAGNOSIS — M6281 Muscle weakness (generalized): Secondary | ICD-10-CM | POA: Diagnosis not present

## 2017-01-24 DIAGNOSIS — I10 Essential (primary) hypertension: Secondary | ICD-10-CM | POA: Diagnosis not present

## 2017-01-24 DIAGNOSIS — F419 Anxiety disorder, unspecified: Secondary | ICD-10-CM | POA: Diagnosis not present

## 2017-01-24 DIAGNOSIS — L500 Allergic urticaria: Secondary | ICD-10-CM | POA: Diagnosis not present

## 2017-01-24 DIAGNOSIS — R7309 Other abnormal glucose: Secondary | ICD-10-CM | POA: Diagnosis not present

## 2017-01-24 DIAGNOSIS — T50905D Adverse effect of unspecified drugs, medicaments and biological substances, subsequent encounter: Secondary | ICD-10-CM | POA: Diagnosis not present

## 2017-01-24 DIAGNOSIS — Z6825 Body mass index (BMI) 25.0-25.9, adult: Secondary | ICD-10-CM | POA: Diagnosis not present

## 2017-01-24 DIAGNOSIS — R5382 Chronic fatigue, unspecified: Secondary | ICD-10-CM | POA: Diagnosis not present

## 2017-01-24 DIAGNOSIS — I2510 Atherosclerotic heart disease of native coronary artery without angina pectoris: Secondary | ICD-10-CM | POA: Diagnosis not present

## 2017-01-26 DIAGNOSIS — M5136 Other intervertebral disc degeneration, lumbar region: Secondary | ICD-10-CM | POA: Diagnosis not present

## 2017-01-26 DIAGNOSIS — M4310 Spondylolisthesis, site unspecified: Secondary | ICD-10-CM | POA: Diagnosis not present

## 2017-01-28 ENCOUNTER — Telehealth (HOSPITAL_COMMUNITY): Payer: Self-pay

## 2017-01-28 NOTE — Telephone Encounter (Signed)
Pt did not want to reschedule the date of the snow closing and wishes to be D/C. NF

## 2017-01-29 ENCOUNTER — Telehealth (HOSPITAL_COMMUNITY): Payer: Self-pay

## 2017-01-29 NOTE — Telephone Encounter (Signed)
Contacted patient he will be here today to pick up HEP.

## 2017-01-31 ENCOUNTER — Telehealth: Payer: Self-pay | Admitting: *Deleted

## 2017-01-31 NOTE — Telephone Encounter (Signed)
-----   Message from Fay Records, MD sent at 01/22/2017  4:31 PM EST -----   ----- Message ----- From: Leeroy Bock, [place] Sent: 01/18/2017   5:17 PM To: Fay Records, MD  Coverage should be ok if pt is approved - might be tricky dealing with the [place] since his LDL is < 100. Definitely worth giving it a shot. Would you be able to have your nurse schedule him in lipid clinic? If PCSK9i isn't approved, he would likely qualify for one of the lipid trials we're enrolling in.   ----- Message ----- From: Fay Records, MD Sent: 01/18/2017   2:07 PM To: Megan E Supple, RPH  Pt has [employer] as well as [place]  ? If repatha would be difficult to get

## 2017-01-31 NOTE — Telephone Encounter (Signed)
Called patient and scheduled in lipid clinic for 02/01/17.  He was appreciative for the call.

## 2017-02-01 ENCOUNTER — Ambulatory Visit (INDEPENDENT_AMBULATORY_CARE_PROVIDER_SITE_OTHER): Payer: Medicare Other | Admitting: Pharmacist

## 2017-02-01 VITALS — BP 122/84 | HR 74 | Ht 72.0 in | Wt 182.0 lb

## 2017-02-01 DIAGNOSIS — E785 Hyperlipidemia, unspecified: Secondary | ICD-10-CM | POA: Diagnosis not present

## 2017-02-01 NOTE — Patient Instructions (Signed)
I will start the paperwork for Repatha coverage for your cholesterol and will call with updates.  Call Megan in lipid clinic with any questions [phone].

## 2017-02-01 NOTE — Progress Notes (Signed)
Patient ID: [name] [name]                 DOB: 1951/01/30                    MRN: [id number]     HPI: [name] [name] is a pleasant 66 y.o. male patient referred to lipid clinic by Dr Harrington Challenger. PMH is significant for CAD s/p NSTEMI in 2008 tx with PCI of bifurcational lesion in LAD with 3 DES, HLD, and HTN. Pt has a history of statin intolerance and presents to clinic for further management.  Pt is currently taking pravastatin 40mg  daily and Zetia 10mg  daily. He previously took multiple doses of Lipitor and low dose Crestor. He experienced body aches in his arm and leg muscles with each of these. Symptoms appeared within 3-4 weeks of therapy initiation and resolved within a few weeks of drug discontinuation.  Current Medications: pravastatin 40mg  daily, Zetia 10mg  daily Intolerances: Lipitor 40mg  and 80mg  daily, Crestor 10mg  daily Risk Factors: CAD, NSTEMI, HTN LDL goal: 70mg /dL  Diet: Breakfast - Multigrain cheerios and fruit. Lunch - Beans, chicken and biscuit from [employer]. Dinner - subs, fish, etc. Limits pizza and burgers.  Exercise: Walks his dog daily.  Family History: The patient's family history includes Dementia in his mother; Lung cancer (age of onset: 71) in his sister; Prostate cancer (age of onset: 40) in his father.   Social History: The patient  reports that he quit smoking about 9 years ago. His smoking use included Cigarettes. He has a 30.00 pack-year smoking history. He has never used smokeless tobacco. He reports that he does not drink alcohol or use drugs.   Labs: 01/24/17: TC 173, TG 132, HDL 44, LDL 103, LFTs wnl (pravastatin 40mg  daily, Zetia 10mg  daily) 08/29/16: TC 165, TG 118, HDL 52, LDL 89, LFTs wnl (pravastatin 40mg  daily, Zetia 10mg  daily)  Past Medical History:  Diagnosis Date  . Anxiety   . Arthritis    back pain  . Avascular necrosis of bone of hip ([place])    right  . CAD (coronary artery disease)    stents x3 2008  . GERD (gastroesophageal reflux  disease)   . History of nuclear stress test    a. Myoview 10/17: EF 56%, diaphragmatic attenuation, no ischemia, low risk  . HTN (hypertension)   . Hyperlipidemia   . Myocardial infarction   . Paresthesias     Current Outpatient Prescriptions on File Prior to Visit  Medication Sig Dispense Refill  . aspirin EC 81 MG tablet Take 81 mg by mouth daily.    . calcium carbonate (TUMS EX) 750 MG chewable tablet Chew 1 tablet by mouth 3 (three) times daily as needed for heartburn.     . Coenzyme Q10 200 MG capsule Take 200 mg by mouth daily.    Marland Kitchen ezetimibe (ZETIA) 10 MG tablet Take 10 mg by mouth daily.    . famotidine (PEPCID) 20 MG tablet Take 1 tablet (20 mg total) by mouth 2 (two) times daily. 180 tablet 3  . levothyroxine (SYNTHROID, LEVOTHROID) 50 MCG tablet Take 50 mcg by mouth daily.     Marland Kitchen losartan-hydrochlorothiazide (HYZAAR) 100-25 MG tablet Take 0.5 tablets by mouth 2 (two) times daily. 90 tablet 3  . nitroGLYCERIN (NITROSTAT) 0.4 MG SL tablet Place 0.4 mg under the tongue every 5 (five) minutes as needed for chest pain. X 3 doses    . pravastatin (PRAVACHOL) 40 MG tablet TAKE ONE TABLET  BY MOUTH ONCE DAILY IN THE EVENING 90 tablet 3  . Saw Palmetto, Serenoa repens, (SAW PALMETTO PO) Take 1 tablet by mouth daily. PATIENT NOT SURE OF THE DOSE     No current facility-administered medications on file prior to visit.     Allergies  Allergen Reactions  . Other Anaphylaxis    Bee sting  . Bee Venom Hives and Swelling  . Clopidogrel Bisulfate Hives    Hives  . Crestor [Rosuvastatin]     Muscle aches  . Lipitor [Atorvastatin]     Muscle aches   . Penicillin G     Other reaction(s): Other (See Comments) Unsure of reaction  . Penicillins     Pt unsure what type allergy/ was a child when had reaction  . Clopidogrel Rash    Assessment/Plan:  1. Hyperlipidemia - LDL 103 above goal 70mg /dL given history of ASCVD. Pt is intolerant to low dose Crestor and multiple doses of Lipitor  but is tolerating pravastatin 40mg  daily and Zetia 10mg  daily. Given multiple statin intolerances, addition of PCSK9i is the best way to bring LDL to goal. Discussed expected benefits, side effects, and injection technique. Will submit prior authorization for Repatha. Pt has commercial insurance so cost will not be an issue.   Pt signed informed consent for [place] lipid registry.  Megan E. Supple, PharmD, CPP, [place] [id number] [address], [place], [place] [address] Phone: [phone]; Fax: [phone] 02/01/2017 4:19 PM

## 2017-02-07 ENCOUNTER — Telehealth: Payer: Self-pay | Admitting: Pharmacist

## 2017-02-07 MED ORDER — EVOLOCUMAB 140 MG/ML ~~LOC~~ SOAJ: 1.0000 "pen " | SUBCUTANEOUS | 11 refills | Status: DC

## 2017-02-07 NOTE — Telephone Encounter (Signed)
Repatha approved from 02/05/17-05/06/17. Rx sent to [facility].

## 2017-02-22 ENCOUNTER — Encounter: Payer: Self-pay | Admitting: *Deleted

## 2017-02-22 DIAGNOSIS — Z006 Encounter for examination for normal comparison and control in clinical research program: Secondary | ICD-10-CM

## 2017-02-22 NOTE — Progress Notes (Signed)
Late entry: Subject met inclusion and exclusion criteria. The informed consent form, study requirements and expectations were reviewed with the subject and questions and concerns were addressed prior to the signing of the consent form. The subject verbalized understanding of the trail requirements. The subject agreed to participate in the [place] Registryand signed the informed consent. The informed consent was obtained prior to performance of any protocol-specific procedures for the subject. A copy of the signed informed consent was given to the subject and a copy was placed in the subject's medical record.  Jake Bathe, RN 02/01/2017 1600

## 2017-02-27 ENCOUNTER — Telehealth: Payer: Self-pay | Admitting: Pharmacist

## 2017-02-27 NOTE — Telephone Encounter (Signed)
Pt called to state he received his first shipment of Repatha in the mail. He will come in tomorrow 3/1 for injection training.

## 2017-02-28 ENCOUNTER — Telehealth: Payer: Self-pay | Admitting: Pharmacist

## 2017-02-28 DIAGNOSIS — E785 Hyperlipidemia, unspecified: Secondary | ICD-10-CM

## 2017-02-28 NOTE — Telephone Encounter (Signed)
Pt presents today for Repatha injection training. Pt self administered Repatha 140mg /mL into his abdomen with no issues. Labs scheduled for April.

## 2017-03-11 DIAGNOSIS — E039 Hypothyroidism, unspecified: Secondary | ICD-10-CM | POA: Diagnosis not present

## 2017-03-20 DIAGNOSIS — H838X3 Other specified diseases of inner ear, bilateral: Secondary | ICD-10-CM | POA: Diagnosis not present

## 2017-03-20 DIAGNOSIS — H9313 Tinnitus, bilateral: Secondary | ICD-10-CM | POA: Diagnosis not present

## 2017-03-20 DIAGNOSIS — J310 Chronic rhinitis: Secondary | ICD-10-CM | POA: Diagnosis not present

## 2017-03-20 DIAGNOSIS — J343 Hypertrophy of nasal turbinates: Secondary | ICD-10-CM | POA: Diagnosis not present

## 2017-04-19 ENCOUNTER — Other Ambulatory Visit: Payer: Medicare Other

## 2017-04-19 DIAGNOSIS — E785 Hyperlipidemia, unspecified: Secondary | ICD-10-CM | POA: Diagnosis not present

## 2017-04-19 LAB — LIPID PANEL
CHOL/HDL RATIO: 3.2 ratio (ref 0.0–5.0)
Cholesterol, Total: 124 mg/dL (ref 100–199)
HDL: 39 mg/dL — ABNORMAL LOW (ref >39)
LDL Calculated: 61 mg/dL (ref 0–99)
TRIGLYCERIDES: 120 mg/dL (ref 0–149)
VLDL CHOLESTEROL CAL: 24 mg/dL (ref 5–40)

## 2017-04-19 LAB — HEPATIC FUNCTION PANEL
ALK PHOS: 76 IU/L (ref 39–117)
ALT: 13 IU/L (ref 0–44)
AST: 23 IU/L (ref 0–40)
Albumin: 4.3 g/dL (ref 3.6–4.8)
BILIRUBIN, DIRECT: 0.19 mg/dL (ref 0.00–0.40)
Bilirubin Total: 0.6 mg/dL (ref 0.0–1.2)
Total Protein: 6.3 g/dL (ref 6.0–8.5)

## 2017-05-01 ENCOUNTER — Encounter: Payer: Self-pay | Admitting: Gastroenterology

## 2017-06-18 DIAGNOSIS — R7309 Other abnormal glucose: Secondary | ICD-10-CM | POA: Diagnosis not present

## 2017-06-18 DIAGNOSIS — E782 Mixed hyperlipidemia: Secondary | ICD-10-CM | POA: Diagnosis not present

## 2017-07-05 DIAGNOSIS — E663 Overweight: Secondary | ICD-10-CM | POA: Diagnosis not present

## 2017-07-05 DIAGNOSIS — Z1389 Encounter for screening for other disorder: Secondary | ICD-10-CM | POA: Diagnosis not present

## 2017-07-05 DIAGNOSIS — R42 Dizziness and giddiness: Secondary | ICD-10-CM | POA: Diagnosis not present

## 2017-07-05 DIAGNOSIS — J3089 Other allergic rhinitis: Secondary | ICD-10-CM | POA: Diagnosis not present

## 2017-07-05 DIAGNOSIS — Z6825 Body mass index (BMI) 25.0-25.9, adult: Secondary | ICD-10-CM | POA: Diagnosis not present

## 2017-08-23 ENCOUNTER — Ambulatory Visit (HOSPITAL_COMMUNITY): Payer: Medicare Other | Attending: Neurosurgery | Admitting: Physical Therapy

## 2017-08-23 DIAGNOSIS — M6281 Muscle weakness (generalized): Secondary | ICD-10-CM

## 2017-08-23 DIAGNOSIS — R262 Difficulty in walking, not elsewhere classified: Secondary | ICD-10-CM | POA: Diagnosis not present

## 2017-08-23 DIAGNOSIS — M5416 Radiculopathy, lumbar region: Secondary | ICD-10-CM

## 2017-08-23 DIAGNOSIS — R29898 Other symptoms and signs involving the musculoskeletal system: Secondary | ICD-10-CM | POA: Diagnosis not present

## 2017-08-23 NOTE — Patient Instructions (Addendum)
Knee-to-Chest Stretch: Unilateral    With hand behind right knee, pull knee in to chest until a comfortable stretch is felt in lower back and buttocks. Keep back relaxed. Hold __15__ seconds. Repeat 3___ times per set. Do __1__ sets per session. Do __2__ sessions per day.  http://orth.exer.us/126   Copyright  VHI. All rights reserved.  Pelvic Tilt: Posterior - Legs Bent (Supine)    Tighten stomach and flatten back by rolling pelvis down. Hold __3-5__ seconds. Relax. Repeat __10__ times per set. Do _1___ sets per session. Do ____2 sessions per day.  http://orth.exer.us/202   Copyright  VHI. All rights reserved.  Isometric Abdominal    Lying on back with knees bent, tighten stomach by pulling your lower right and lower left abdominal area together. Hold __5__ seconds. Repeat 10____ times per set. Do __1__ sets per session. Do __2__ sessions per day.  http://orth.exer.us/1086   Copyright  VHI. All rights reserved.  Hamstring Stretch: Active    Support behind right knee. Starting with knee bent, attempt to straighten knee until a comfortable stretch is felt in back of thigh. Hold _15___ seconds. Repeat _3___ times per set. Do __1__ sets per session. Do _2___ sessions per day.  http://orth.exer.us/158   Copyright  VHI. All rights reserved.  Isometric Gluteals    Tighten buttock muscles. Repeat _10___ times per set. Do _1___ sets per session. Do __2__ sessions per day.  http://orth.exer.us/1126   Copyright  VHI. All rights reserved.

## 2017-08-23 NOTE — Therapy (Signed)
[name], [place], [address] Phone: [phone]   Fax:  [phone]  Physical Therapy Evaluation  Patient Details  Name: [name] [name] MRN: [id number] Date of Birth: 06-Nov-1951 Referring Provider: Dr. Mindi Curling   Encounter Date: 08/23/2017      PT End of Session - 08/23/17 1305    Visit Number 1   Number of Visits 12   Date for PT Re-Evaluation 09/22/17   Authorization Type medicare/ BCBS   Authorization - Visit Number 1   Authorization - Number of Visits 10   PT Start Time 4008   PT Stop Time 1345   PT Time Calculation (min) 37 min   Activity Tolerance Patient tolerated treatment well      Past Medical History:  Diagnosis Date  . Anxiety   . Arthritis    back pain  . Avascular necrosis of bone of hip ([place])    right  . CAD (coronary artery disease)    stents x3 2008  . GERD (gastroesophageal reflux disease)   . History of nuclear stress test    a. Myoview 10/17: EF 56%, diaphragmatic attenuation, no ischemia, low risk  . HTN (hypertension)   . Hyperlipidemia   . Myocardial infarction   . Paresthesias     Past Surgical History:  Procedure Laterality Date  . CARDIAC CATHETERIZATION     3 stents placed  . COLONOSCOPY  2011 NUR [facility] SCREENING d50 v5   MILD [place] TICS, ? PREP ARTIFACT V. PROCTITIS-rX:[place]. next TCS 2021.  . ESOPHAGEAL DILATION N/A 03/02/2016   Procedure: ESOPHAGEAL DILATION;  Surgeon: Danie Binder, MD;  [facility];  Service: Endoscopy;  Laterality: N/A;  . ESOPHAGOGASTRODUODENOSCOPY  10/03/11   small hiatal hernia/gastric ulcers/stricutre in distal esophagus  . ESOPHAGOGASTRODUODENOSCOPY N/A 03/02/2016   Dr. Oneida Alar: Schatzki's ring at [place] junction s/p dilation, mild non-erosive gastritis/duodenitis   . MASS EXCISION Left 09/30/2014   Procedure: EXCISION MASS LEFT WRIST ;  Surgeon: Daryll Brod, MD;  Location: [facility];  Service: Orthopedics;   Laterality: Left;  . TONSILLECTOMY  age 89  . TOTAL HIP ARTHROPLASTY  age 53   right hip for avascular necrosis of hip and spine    There were no vitals filed for this visit.       Subjective Assessment - 08/23/17 1308    Subjective [name] [name] has been referred to therapy for low back pain.  He states that he is seeing two  MD's.  One at the [place] and the other in [place].  [facility] feels that [name] may benefit from mechanical traction.  The MD in [place] has mentioned surgery.  He states that he was doing better but then he pressure washed his house about three weeks ago which caused significant increase  pain in his low back which is now radiating into his foot.  He is actually feeling better but not totally normal since this occured..    Pertinent History MRI:  bulging L3-4 and L4-L5 ; forminal narrowing at L5-S1;  Grade 2 anteriorlisthesis of L5S1    Limitations Lifting;Standing;Walking;House hold activities   How long can you sit comfortably? better now but for the past couple of weeks he could not get comfortable at all    How long can you stand comfortably? has not tried.    How long can you walk comfortably? able to walk in the community now but could not have done  this a week ago    Diagnostic tests MRI see above    Patient Stated Goals keep the pain away   Currently in Pain? Yes   Pain Score 3   worst in the past week 8/10    Pain Location Back   Pain Orientation Lower   Pain Descriptors / Indicators Aching;Throbbing;Tightness   Pain Type Chronic pain   Pain Radiating Towards Rt foot    Pain Onset 1 to 4 weeks ago   Pain Frequency Constant   Aggravating Factors  everything    Pain Relieving Factors ibuprofen   Effect of Pain on Daily Activities increases             [facility] PT Assessment - 08/23/17 0001      Assessment   Medical Diagnosis Low back pain; spondololisthesis   Referring Provider Dr. Mindi Curling    Onset Date/Surgical Date 07/31/17    Next MD Visit 09/19/2017   Prior Therapy years ago      Precautions   Precautions None     Restrictions   Weight Bearing Restrictions No     Balance Screen   Has the patient fallen in the past 6 months No   Has the patient had a decrease in activity level because of a fear of falling?  No   Is the patient reluctant to leave their home because of a fear of falling?  No     Prior Function   Level of Independence Independent   Vocation Retired     Charity fundraiser Status Within Functional Limits for tasks assessed     Observation/Other Assessments   Focus on Therapeutic Outcomes (FOTO)  53     Functional Tests   Functional tests Single leg stance;Sit to Stand     Single Leg Stance   Comments Rt  10 seconds LT 16     Sit to Stand   Comments 5x 18.88     Posture/Postural Control   Posture/Postural Control Postural limitations   Postural Limitations Rounded Shoulders;Forward head;Decreased lumbar lordosis;Increased thoracic kyphosis     ROM / Strength   AROM / PROM / Strength AROM;Strength     AROM   AROM Assessment Site Lumbar   Lumbar Flexion normal with reps causing improved pain.    Lumbar Extension decreased 50% with reps causing increased pain      Strength   Strength Assessment Site Hip;Knee;Ankle   Right/Left Hip Right;Left   Right Hip Extension 3/5   Right Hip ABduction 3+/5   Left Hip Extension 3/5   Left Hip ABduction 3+/5   Right/Left Knee Right;Left   Right Knee Flexion 3+/5   Right Knee Extension 5/5   Left Knee Flexion 3+/5   Left Knee Extension 5/5   Right/Left Ankle Right;Left   Right Ankle Dorsiflexion 5/5   Left Ankle Dorsiflexion 5/5     Flexibility   Soft Tissue Assessment /Muscle Length yes   Hamstrings Rt: 145; Lt 145             Objective measurements completed on examination: See above findings.   sitting in good posture; scapular retraction x 10  Supine:  Pelvic tilt, abdominal set, gluteal set,  x10hamstring  stretch x3               PT Education - 08/23/17 1304    Education provided Yes   Education Details HEP   Person(s) Educated Patient   Methods Explanation   Comprehension Verbalized understanding  PT Short Term Goals - 08/23/17 1617      PT SHORT TERM GOAL #1   Title Pt to demonstrate and be able to verbalize the importance of good sitting posture.    Time 3   Period Weeks   Status New   Target Date 09/13/17     PT SHORT TERM GOAL #2   Title Pt to have no greater than a 5/10 pain in his back and Rt leg to allow pt to sit for an hour to enjoy eating a meal    Time 3   Period Weeks   Status New     PT SHORT TERM GOAL #3   Title PT strength in his right leg to be increased by 1/2 grade to allow him to come from sit to stand from his couch with increase ease.    Time 3   Status New     PT SHORT TERM GOAL #4   Title Pt to be able to walk/stand for 30 minutes without experiencing increased back or right leg pain to be able to complete short shopping trips.    Time 3   Period Weeks   Status New           PT Long Term Goals - 08/23/17 1621      PT LONG TERM GOAL #1   Title Pt pain in his back and right leg to be no greater than a 3/10 to allow pt to bring groceries into the house from his car in comfort.    Time 6   Period Weeks   Status New   Target Date 10/04/17     PT LONG TERM GOAL #2   Title Pt to be able to complete light yard work for an hour without experiencing increased back or right leg pain    Time 6   Period Weeks   Status New     PT LONG TERM GOAL #3   Title PT to be able to sit for two hours for traveling without having increased back or right leg pain    Time 6   Period Weeks   Status New     PT LONG TERM GOAL #4   Title Pt to be able to sleep without having to take medication.    Time 6   Period Weeks   Status New                Plan - 08/23/17 1609    Clinical Impression Statement Mr. Mcnealy is a 66 yo male  who has had chronic back pain.  His pain was exacerbated approximately three weeks ago to a point where he could not sleep.  He is being seen both by a [place] who has suggested traction and on orthopedic surgeon in [facility] who has suggested surgery.  At this point his back pain has improved immensley on it's own but he is still having difficulty with right leg pain.  He has been referred to skilled physical therapy; examination demonstrates poor sitting posture, decreased ROM, deceased strength, decreased balance and tight paraspinal mm.  Mr.  Grunewald will benefit from skilled physical therapy to address these items and maximize his functional ability.  His goal is to have less pain.    History and Personal Factors relevant to plan of care: OA of cervical and lumbar spine, rotator cuff surgery Lt, avascular necrosis of Rt hip, MI , CAT , HTN    Clinical Presentation Stable  Clinical Decision Making Low   Rehab Potential Good   PT Frequency 2x / week   PT Duration 6 weeks   PT Treatment/Interventions Functional mobility training;Therapeutic activities;Therapeutic exercise;Balance training;Patient/family education;Manual techniques;Cryotherapy;Moist Heat;Iontophoresis 4mg /ml Dexamethasone;Ultrasound;Traction   PT Next Visit Plan begin decompression exercises, progress stabilization exercises to include bent knee raise and bridging. May attempt traction begin at 50# if traction is attempted.    PT Home Exercise Plan sitting tall, scapular retraction, pelvic tilt, knee to chest and active hamstring stretch    Consulted and Agree with Plan of Care Patient      Patient will benefit from skilled therapeutic intervention in order to improve the following deficits and impairments:  Decreased activity tolerance, Decreased balance, Decreased range of motion, Decreased strength, Difficulty walking, Increased fascial restricitons, Postural dysfunction, Improper body mechanics, Pain  Visit  Diagnosis: Right lumbar radiculopathy  Difficulty in walking, not elsewhere classified  Muscle weakness (generalized)      G-Codes - 29-Aug-2017 1625    Functional Assessment Tool Used (Outpatient Only) foto   Functional Limitation Other PT primary   Other PT Primary Current Status (W9794) At least 40 percent but less than 60 percent impaired, limited or restricted   Other PT Primary Goal Status (I0165) At least 20 percent but less than 40 percent impaired, limited or restricted       Problem List Patient Active Problem List   Diagnosis Date Noted  . Esophageal dysphagia 02/08/2016  . Hemorrhoids 02/08/2016  . Chronic radicular cervical pain 07/29/2014  . GERD (gastroesophageal reflux disease) 10/14/2012  . Constipation 10/14/2012  . Stiffness of joint, not elsewhere classified, other specified site 02/21/2012  . PUD (peptic ulcer disease) 12/21/2011  . Dysphagia 07/25/2011  . Hyperlipidemia 08/12/2009  . ESSENTIAL HYPERTENSION, BENIGN 08/12/2009  . CAD, NATIVE VESSEL 08/12/2009  . CUTANEOUS ERUPTIONS, DRUG-INDUCED 01/09/2008  . LOCALIZED SUPERFICIAL SWELLING MASS OR LUMP 01/09/2008   Rayetta Humphrey, PT CLT [phone] Aug 29, 2017, 4:28 PM  [facility] [address] [place], [place], [address] Phone: [phone]   Fax:  [phone]  Name: YASSEEN SALLS MRN: [id number] Date of Birth: 12/31/1951

## 2017-08-27 ENCOUNTER — Ambulatory Visit (HOSPITAL_COMMUNITY): Payer: Medicare Other | Admitting: Physical Therapy

## 2017-08-27 DIAGNOSIS — M6281 Muscle weakness (generalized): Secondary | ICD-10-CM

## 2017-08-27 DIAGNOSIS — M5416 Radiculopathy, lumbar region: Secondary | ICD-10-CM

## 2017-08-27 DIAGNOSIS — R262 Difficulty in walking, not elsewhere classified: Secondary | ICD-10-CM | POA: Diagnosis not present

## 2017-08-27 DIAGNOSIS — R29898 Other symptoms and signs involving the musculoskeletal system: Secondary | ICD-10-CM | POA: Diagnosis not present

## 2017-08-27 NOTE — Therapy (Signed)
[place], [place], [address] Phone: [phone]   Fax:  [phone]  Physical Therapy Treatment  Patient Details  Name: [name] [name] MRN: [id number] Date of Birth: Jun 15, 1951 Referring Provider: Dr. Mindi Curling   Encounter Date: 08/27/2017      PT End of Session - 08/27/17 0836    Visit Number 2   Number of Visits 12   Date for PT Re-Evaluation 09/22/17   Authorization Type medicare/ BCBS   Authorization - Visit Number 2   Authorization - Number of Visits 10   PT Start Time [id number]   PT Stop Time 0900   PT Time Calculation (min) 43 min   Activity Tolerance Patient tolerated treatment well      Past Medical History:  Diagnosis Date  . Anxiety   . Arthritis    back pain  . Avascular necrosis of bone of hip ([place])    right  . CAD (coronary artery disease)    stents x3 2008  . GERD (gastroesophageal reflux disease)   . History of nuclear stress test    a. Myoview 10/17: EF 56%, diaphragmatic attenuation, no ischemia, low risk  . HTN (hypertension)   . Hyperlipidemia   . Myocardial infarction   . Paresthesias     Past Surgical History:  Procedure Laterality Date  . CARDIAC CATHETERIZATION     3 stents placed  . COLONOSCOPY  2011 NUR [facility] SCREENING d50 v5   MILD [place] TICS, ? PREP ARTIFACT V. PROCTITIS-rX:[place]. next TCS 2021.  . ESOPHAGEAL DILATION N/A 03/02/2016   Procedure: ESOPHAGEAL DILATION;  Surgeon: Danie Binder, MD;  [facility];  Service: Endoscopy;  Laterality: N/A;  . ESOPHAGOGASTRODUODENOSCOPY  10/03/11   small hiatal hernia/gastric ulcers/stricutre in distal esophagus  . ESOPHAGOGASTRODUODENOSCOPY N/A 03/02/2016   Dr. Oneida Alar: Schatzki's ring at [place] junction s/p dilation, mild non-erosive gastritis/duodenitis   . MASS EXCISION Left 09/30/2014   Procedure: EXCISION MASS LEFT WRIST ;  Surgeon: Daryll Brod, MD;  Location: [facility];  Service: Orthopedics;   Laterality: Left;  . TONSILLECTOMY  age 32  . TOTAL HIP ARTHROPLASTY  age 54   right hip for avascular necrosis of hip and spine    There were no vitals filed for this visit.      Subjective Assessment - 08/27/17 0818    Subjective PT states that his back pain is aggrevated by exercising    Pertinent History MRI:  bulging L3-4 and L4-L5 ; forminal narrowing at L5-S1;  Grade 2 anteriorlisthesis of L5S1    Limitations Lifting;Standing;Walking;House hold activities   How long can you sit comfortably? better now but for the past couple of weeks he could not get comfortable at all    How long can you stand comfortably? has not tried.    How long can you walk comfortably? able to walk in the community now but could not have done this a week ago    Diagnostic tests MRI see above    Patient Stated Goals keep the pain away   Currently in Pain? Yes   Pain Score 4    Pain Location Back   Pain Orientation Lower   Pain Descriptors / Indicators Aching   Pain Type Chronic pain   Pain Radiating Towards Rt hip   Pain Onset 1 to 4 weeks ago   Pain Frequency Constant   Aggravating Factors  exercising    Pain Relieving Factors nothing  Effect of Pain on Daily Activities increases                          [facility] Adult PT Treatment/Exercise - 08/27/17 0001      Posture/Postural Control   Posture/Postural Control Postural limitations   Postural Limitations Rounded Shoulders;Forward head;Decreased lumbar lordosis;Increased thoracic kyphosis     Exercises   Exercises Lumbar     Lumbar Exercises: Stretches   Active Hamstring Stretch 2 reps;20 seconds   Pelvic Tilt 5 reps  x2     Lumbar Exercises: Seated   Other Seated Lumbar Exercises scapular retraction; sitting tall      Lumbar Exercises: Supine   Ab Set --   Glut Set --   Bent Knee Raise/bridges 10 reps   Other Supine Lumbar Exercises decompression exercises 1-5                   PT Short Term Goals -  08/27/17 0854      PT SHORT TERM GOAL #1   Title Pt to demonstrate and be able to verbalize the importance of good sitting posture.    Time 3   Period Weeks   Status On-going     PT SHORT TERM GOAL #2   Title Pt to have no greater than a 5/10 pain in his back and Rt leg to allow pt to sit for an hour to enjoy eating a meal    Time 3   Period Weeks   Status On-going     PT SHORT TERM GOAL #3   Title PT strength in his right leg to be increased by 1/2 grade to allow him to come from sit to stand from his couch with increase ease.    Time 3   Status On-going     PT SHORT TERM GOAL #4   Title Pt to be able to walk/stand for 30 minutes without experiencing increased back or right leg pain to be able to complete short shopping trips.    Time 3   Period Weeks   Status On-going           PT Long Term Goals - 08/27/17 0854      PT LONG TERM GOAL #1   Title Pt pain in his back and right leg to be no greater than a 3/10 to allow pt to bring groceries into the house from his car in comfort.    Time 6   Period Weeks   Status On-going     PT LONG TERM GOAL #2   Title Pt to be able to complete light yard work for an hour without experiencing increased back or right leg pain    Time 6   Period Weeks   Status On-going     PT LONG TERM GOAL #3   Title PT to be able to sit for two hours for traveling without having increased back or right leg pain    Time 6   Period Weeks   Status On-going     PT LONG TERM GOAL #4   Title Pt to be able to sleep without having to take medication.    Time 6   Period Weeks   Status On-going               Plan - 08/27/17 4097    Clinical Impression Statement Evaluation and goals reviewed with pt.  Added decompression exercises as well as beginning stabilization exercises.  Rehab Potential Good   PT Frequency 2x / week   PT Duration 6 weeks   PT Treatment/Interventions Functional mobility training;Therapeutic activities;Therapeutic  exercise;Balance training;Patient/family education;Manual techniques;Cryotherapy;Moist Heat;Iontophoresis 4mg /ml Dexamethasone;Ultrasound;Traction   PT Next Visit Plan begin sidelying abduction; prone single leg raise with pillows; and t-band decompression exercises    PT Home Exercise Plan sitting tall, scapular retraction, pelvic tilt, knee to chest and active hamstring stretch; 8/28: bridge, bent knee raise and decompression 1-5     Consulted and Agree with Plan of Care Patient      Patient will benefit from skilled therapeutic intervention in order to improve the following deficits and impairments:  Decreased activity tolerance, Decreased balance, Decreased range of motion, Decreased strength, Difficulty walking, Increased fascial restricitons, Postural dysfunction, Improper body mechanics, Pain  Visit Diagnosis: Right lumbar radiculopathy  Difficulty in walking, not elsewhere classified  Muscle weakness (generalized)     Problem List Patient Active Problem List   Diagnosis Date Noted  . Esophageal dysphagia 02/08/2016  . Hemorrhoids 02/08/2016  . Chronic radicular cervical pain 07/29/2014  . GERD (gastroesophageal reflux disease) 10/14/2012  . Constipation 10/14/2012  . Stiffness of joint, not elsewhere classified, other specified site 02/21/2012  . PUD (peptic ulcer disease) 12/21/2011  . Dysphagia 07/25/2011  . Hyperlipidemia 08/12/2009  . ESSENTIAL HYPERTENSION, BENIGN 08/12/2009  . CAD, NATIVE VESSEL 08/12/2009  . CUTANEOUS ERUPTIONS, DRUG-INDUCED 01/09/2008  . LOCALIZED SUPERFICIAL SWELLING MASS OR LUMP 01/09/2008  Rayetta Humphrey, PT CLT [phone] 08/27/2017, 9:01 AM  [facility] [address] [place], [place], [address] Phone: [phone]   Fax:  [phone]  Name: [name] [name] MRN: [id number] Date of Birth: 07-02-1951

## 2017-08-27 NOTE — Patient Instructions (Addendum)
Bent Leg Lift (Hook-Lying)    Tighten stomach and slowly raise right leg _3___ inches from floor. Keep trunk rigid. Hold _3___ seconds. Repeat _10___ times per set. Do 1____ sets per session. Do _2___ sessions per day.  http://orth.exer.us/1090   Copyright  VHI. All rights reserved.  Bridging    Slowly raise buttocks from floor, keeping stomach tight. Repeat _10___ times per set. Do __1__ sets per session. Do __2__ sessions per day.  http://orth.exer.us/1096   Copyright  VHI. All rights reserved.

## 2017-08-30 ENCOUNTER — Telehealth: Payer: Self-pay | Admitting: Pharmacist

## 2017-08-30 ENCOUNTER — Ambulatory Visit (HOSPITAL_COMMUNITY): Payer: Medicare Other | Admitting: Physical Therapy

## 2017-08-30 DIAGNOSIS — M6281 Muscle weakness (generalized): Secondary | ICD-10-CM | POA: Diagnosis not present

## 2017-08-30 DIAGNOSIS — R29898 Other symptoms and signs involving the musculoskeletal system: Secondary | ICD-10-CM | POA: Diagnosis not present

## 2017-08-30 DIAGNOSIS — M25551 Pain in right hip: Secondary | ICD-10-CM | POA: Diagnosis not present

## 2017-08-30 DIAGNOSIS — R262 Difficulty in walking, not elsewhere classified: Secondary | ICD-10-CM

## 2017-08-30 DIAGNOSIS — Z96641 Presence of right artificial hip joint: Secondary | ICD-10-CM | POA: Diagnosis not present

## 2017-08-30 DIAGNOSIS — M5416 Radiculopathy, lumbar region: Secondary | ICD-10-CM

## 2017-08-30 NOTE — Telephone Encounter (Signed)
Patient called in to report that he has stopped his pravastatin and zetia a while back. He was feeling nauseated and stopped both of these, but continued on Repatha. He decided to retry pravastatin and he felt his heart pounding. He again stopped and symptoms resolved. He also retried Zetia but became nauseated after just a few doses. He reports he recently had labs as below. Advised to continue Repatha alone given side effects after rechallenge with other agents and LDL at goal on Repatha alone. He requested these be routed to [facility] as FYI. This has been done.   Labs recently at [facility] with LDL at 27 on 06/18/17. TC 105, TG 159, HDL 46.

## 2017-08-30 NOTE — Therapy (Signed)
[facility] [address] [place], [place], [address] Phone: [phone]   Fax:  [phone]  Physical Therapy Treatment  Patient Details  Name: [name] [name] MRN: [id number] Date of Birth: November 24, 1951 Referring Provider: Dr. Mindi Curling   Encounter Date: 08/30/2017      PT End of Session - 08/30/17 0835    Visit Number 3   Number of Visits 12   Date for PT Re-Evaluation 09/22/17   Authorization Type medicare/ BCBS   Authorization - Visit Number 3   Authorization - Number of Visits 10   PT Start Time [phone]   PT Stop Time 0858   PT Time Calculation (min) 41 min   Activity Tolerance Patient tolerated treatment well      Past Medical History:  Diagnosis Date  . Anxiety   . Arthritis    back pain  . Avascular necrosis of bone of hip ([place])    right  . CAD (coronary artery disease)    stents x3 2008  . GERD (gastroesophageal reflux disease)   . History of nuclear stress test    a. Myoview 10/17: EF 56%, diaphragmatic attenuation, no ischemia, low risk  . HTN (hypertension)   . Hyperlipidemia   . Myocardial infarction   . Paresthesias     Past Surgical History:  Procedure Laterality Date  . CARDIAC CATHETERIZATION     3 stents placed  . COLONOSCOPY  2011 NUR [facility] SCREENING d50 v5   MILD [place] TICS, ? PREP ARTIFACT V. PROCTITIS-rX:[place]. next TCS 2021.  . ESOPHAGEAL DILATION N/A 03/02/2016   Procedure: ESOPHAGEAL DILATION;  Surgeon: Danie Binder, MD;  [facility];  Service: Endoscopy;  Laterality: N/A;  . ESOPHAGOGASTRODUODENOSCOPY  10/03/11   small hiatal hernia/gastric ulcers/stricutre in distal esophagus  . ESOPHAGOGASTRODUODENOSCOPY N/A 03/02/2016   Dr. Oneida Alar: Schatzki's ring at [place] junction s/p dilation, mild non-erosive gastritis/duodenitis   . MASS EXCISION Left 09/30/2014   Procedure: EXCISION MASS LEFT WRIST ;  Surgeon: Daryll Brod, MD;  Location: [facility];  Service: Orthopedics;   Laterality: Left;  . TONSILLECTOMY  age 66  . TOTAL HIP ARTHROPLASTY  age 66   right hip for avascular necrosis of hip and spine    There were no vitals filed for this visit.      Subjective Assessment - 08/30/17 0821    Subjective Pt reports that things are going well. He says his exercises are going well when he does them. Pt states that he set up an appointment with [facility] to check out his hip and make sure things are going well.    Pertinent History MRI:  bulging L3-4 and L4-L5 ; forminal narrowing at L5-S1;  Grade 2 anteriorlisthesis of L5S1    Limitations Lifting;Standing;Walking;House hold activities   How long can you sit comfortably? better now but for the past couple of weeks he could not get comfortable at all    How long can you stand comfortably? has not tried.    How long can you walk comfortably? able to walk in the community now but could not have done this a week ago    Diagnostic tests MRI see above    Patient Stated Goals keep the pain away   Currently in Pain? Yes   Pain Score 4    Pain Location Back   Pain Orientation Mid;Lower   Pain Descriptors / Indicators Aching   Pain Type Chronic pain   Pain Onset 1  to 4 weeks ago   Pain Frequency Constant   Aggravating Factors  always bothering him    Pain Relieving Factors not sure   Effect of Pain on Daily Activities increases                          [facility] Adult PT Treatment/Exercise - 08/30/17 0001      Lumbar Exercises: Supine   Other Supine Lumbar Exercises Decompression exercises with theraband 1-4 x10 reps each    Other Supine Lumbar Exercises BUE press into physioball completing rolling to Lt and Rt x10 each      Lumbar Exercises: Sidelying   Hip Abduction 15 reps   Hip Abduction Limitations back against wall      Lumbar Exercises: Prone   Straight Leg Raise 10 reps   Straight Leg Raises Limitations (+) trunk rotation during Rt hip extension, requiring verbal cues to  improve                 PT Education - 08/30/17 0953    Education provided Yes   Education Details discussed benefits of completing decompression exercises correctly to improve deep abdominal activation and lead into more functional therex; technique with therex    Person(s) Educated Patient   Methods Explanation;Verbal cues;Tactile cues   Comprehension Verbalized understanding;Returned demonstration          PT Short Term Goals - 08/27/17 0854      PT SHORT TERM GOAL #1   Title Pt to demonstrate and be able to verbalize the importance of good sitting posture.    Time 3   Period Weeks   Status On-going     PT SHORT TERM GOAL #2   Title Pt to have no greater than a 5/10 pain in his back and Rt leg to allow pt to sit for an hour to enjoy eating a meal    Time 3   Period Weeks   Status On-going     PT SHORT TERM GOAL #3   Title PT strength in his right leg to be increased by 1/2 grade to allow him to come from sit to stand from his couch with increase ease.    Time 3   Status On-going     PT SHORT TERM GOAL #4   Title Pt to be able to walk/stand for 30 minutes without experiencing increased back or right leg pain to be able to complete short shopping trips.    Time 3   Period Weeks   Status On-going           PT Long Term Goals - 08/27/17 0854      PT LONG TERM GOAL #1   Title Pt pain in his back and right leg to be no greater than a 3/10 to allow pt to bring groceries into the house from his car in comfort.    Time 6   Period Weeks   Status On-going     PT LONG TERM GOAL #2   Title Pt to be able to complete light yard work for an hour without experiencing increased back or right leg pain    Time 6   Period Weeks   Status On-going     PT LONG TERM GOAL #3   Title PT to be able to sit for two hours for traveling without having increased back or right leg pain    Time 6   Period Weeks   Status  On-going     PT LONG TERM GOAL #4   Title Pt to be able  to sleep without having to take medication.    Time 6   Period Weeks   Status On-going               Plan - 08/30/17 0859    Clinical Impression Statement Today's session continued with therex to improve deep abdominal activation and LE strength. Pt did require verbal cues to increase abdominal activation during decompression therex, otherwise he reported no increase in pain and felt he could stand up taller by the end of today's session. Will continue with current POC.    Rehab Potential Good   PT Frequency 2x / week   PT Duration 6 weeks   PT Treatment/Interventions Functional mobility training;Therapeutic activities;Therapeutic exercise;Balance training;Patient/family education;Manual techniques;Cryotherapy;Moist Heat;Iontophoresis 4mg /ml Dexamethasone;Ultrasound;Traction   PT Next Visit Plan conitnue to work on decompression therex with theraband ensuring proper technique and muscle activation (provide for HEP if pt able to do this); possibly talk about use of lumbar roll when sitting to address flexed posturing    PT Home Exercise Plan sitting tall, scapular retraction, pelvic tilt, knee to chest and active hamstring stretch; 8/28: bridge, bent knee raise and decompression 1-5     Consulted and Agree with Plan of Care Patient      Patient will benefit from skilled therapeutic intervention in order to improve the following deficits and impairments:  Decreased activity tolerance, Decreased balance, Decreased range of motion, Decreased strength, Difficulty walking, Increased fascial restricitons, Postural dysfunction, Improper body mechanics, Pain  Visit Diagnosis: Right lumbar radiculopathy  Difficulty in walking, not elsewhere classified  Muscle weakness (generalized)  Other symptoms and signs involving the musculoskeletal system     Problem List Patient Active Problem List   Diagnosis Date Noted  . Esophageal dysphagia 02/08/2016  . Hemorrhoids 02/08/2016  . Chronic  radicular cervical pain 07/29/2014  . GERD (gastroesophageal reflux disease) 10/14/2012  . Constipation 10/14/2012  . Stiffness of joint, not elsewhere classified, other specified site 02/21/2012  . PUD (peptic ulcer disease) 12/21/2011  . Dysphagia 07/25/2011  . Hyperlipidemia 08/12/2009  . ESSENTIAL HYPERTENSION, BENIGN 08/12/2009  . CAD, NATIVE VESSEL 08/12/2009  . CUTANEOUS ERUPTIONS, DRUG-INDUCED 01/09/2008  . LOCALIZED SUPERFICIAL SWELLING MASS OR LUMP 01/09/2008   9:55 AM,08/30/17 Elly Modena PT, DPT [facility] [address] [place], [place], [address] Phone: [phone]   Fax:  [phone]  Name: [name] [name] MRN: [id number] Date of Birth: 04-17-1951

## 2017-09-03 NOTE — Telephone Encounter (Signed)
Allergy list updated with intolerances. Pt to continue on Repatha monotherapy since LDL is at goal < 70 and pt is tolerating well.

## 2017-09-04 ENCOUNTER — Ambulatory Visit (HOSPITAL_COMMUNITY): Payer: Medicare Other | Attending: Neurosurgery

## 2017-09-04 DIAGNOSIS — M5416 Radiculopathy, lumbar region: Secondary | ICD-10-CM | POA: Insufficient documentation

## 2017-09-04 DIAGNOSIS — R262 Difficulty in walking, not elsewhere classified: Secondary | ICD-10-CM | POA: Insufficient documentation

## 2017-09-04 DIAGNOSIS — R29898 Other symptoms and signs involving the musculoskeletal system: Secondary | ICD-10-CM | POA: Insufficient documentation

## 2017-09-04 DIAGNOSIS — M6281 Muscle weakness (generalized): Secondary | ICD-10-CM | POA: Diagnosis not present

## 2017-09-04 NOTE — Therapy (Signed)
[facility] [address] [place], [place], [address] Phone: [phone]   Fax:  [phone]  Physical Therapy Treatment  Patient Details  Name: [name] [name] MRN: [id number] Date of Birth: [birth year] Referring Provider: Dr. Mindi Curling   Encounter Date: 09/04/2017      PT End of Session - 09/04/17 0836    Visit Number 4   Number of Visits 12   Date for PT Re-Evaluation 09/22/17   Authorization Type medicare/ BCBS   Authorization - Visit Number 4   Authorization - Number of Visits 10   PT Start Time [phone]   PT Stop Time 0902   PT Time Calculation (min) 45 min   Activity Tolerance Patient tolerated treatment well   Behavior During Therapy [facility] for tasks assessed/performed      Past Medical History:  Diagnosis Date  . Anxiety   . Arthritis    back pain  . Avascular necrosis of bone of hip ([facility])    right  . CAD (coronary artery disease)    stents x3 2008  . GERD (gastroesophageal reflux disease)   . History of nuclear stress test    a. Myoview 10/17: EF 56%, diaphragmatic attenuation, no ischemia, low risk  . HTN (hypertension)   . Hyperlipidemia   . Myocardial infarction   . Paresthesias     Past Surgical History:  Procedure Laterality Date  . CARDIAC CATHETERIZATION     3 stents placed  . COLONOSCOPY  2011 NUR [facility] SCREENING d50 v5   MILD [place] TICS, ? PREP ARTIFACT V. PROCTITIS-rX:[place]. next TCS 2021.  . ESOPHAGEAL DILATION N/A 03/02/2016   Procedure: ESOPHAGEAL DILATION;  Surgeon: Danie Binder, MD;  [facility];  Service: Endoscopy;  Laterality: N/A;  . ESOPHAGOGASTRODUODENOSCOPY  10/03/11   small hiatal hernia/gastric ulcers/stricutre in distal esophagus  . ESOPHAGOGASTRODUODENOSCOPY N/A 03/02/2016   Dr. Oneida Alar: Schatzki's ring at [place] junction s/p dilation, mild non-erosive gastritis/duodenitis   . MASS EXCISION Left 09/30/2014   Procedure: EXCISION MASS LEFT WRIST ;  Surgeon: Daryll Brod, MD;  Location:  [facility];  Service: Orthopedics;  Laterality: Left;  . TONSILLECTOMY  age 49  . TOTAL HIP ARTHROPLASTY  age 68   right hip for avascular necrosis of hip and spine    There were no vitals filed for this visit.      Subjective Assessment - 09/04/17 0828    Subjective Pt stated he continues to have LBP pain scale 4/10.  Reports compliance with HEP.  Has apt [facility] with neurosurgeon on October, 20th 2018.   Pertinent History MRI:  bulging L3-4 and L4-L5 ; forminal narrowing at L5-S1;  Grade 2 anteriorlisthesis of L5S1    Patient Stated Goals keep the pain away   Currently in Pain? Yes   Pain Score 4    Pain Location Back   Pain Orientation Lower   Pain Descriptors / Indicators Aching;Sore   Pain Type Chronic pain   Pain Radiating Towards Rt foot anterior   Pain Onset 1 to 4 weeks ago   Pain Frequency Constant   Aggravating Factors  always bothering him   Pain Relieving Factors not sure   Effect of Pain on Daily Activities increases                         [facility] Adult PT Treatment/Exercise - 09/04/17 0001      Lumbar Exercises: Standing  Other Standing Lumbar Exercises standing against wall for posture awareness; cervical retraction and UE flexion 5 reps     Lumbar Exercises: Seated   Other Seated Lumbar Exercises Sittiing tall with lumbar roll; scapular and cervical retraction     Lumbar Exercises: Supine   Ab Set 10 reps   Other Supine Lumbar Exercises Decompression exercises with theraband 1-4 x10 reps each    Other Supine Lumbar Exercises BUE press into physioball completing rolling to Lt and Rt x10 each      Lumbar Exercises: Sidelying   Hip Abduction 15 reps   Hip Abduction Limitations back against wall      Lumbar Exercises: Prone   Straight Leg Raise 10 reps   Straight Leg Raises Limitations (+) trunk rotation during Rt hip extension, requiring verbal cues to improve; pillow under hips                PT  Education - 09/04/17 1015    Education provided Yes   Education Details educated importance of posture and benefits with lumbar roll   Person(s) Educated Patient   Methods Explanation;Demonstration   Comprehension Verbalized understanding;Returned demonstration;Need further instruction          PT Short Term Goals - 08/27/17 0854      PT SHORT TERM GOAL #1   Title Pt to demonstrate and be able to verbalize the importance of good sitting posture.    Time 3   Period Weeks   Status On-going     PT SHORT TERM GOAL #2   Title Pt to have no greater than a 5/10 pain in his back and Rt leg to allow pt to sit for an hour to enjoy eating a meal    Time 3   Period Weeks   Status On-going     PT SHORT TERM GOAL #3   Title PT strength in his right leg to be increased by 1/2 grade to allow him to come from sit to stand from his couch with increase ease.    Time 3   Status On-going     PT SHORT TERM GOAL #4   Title Pt to be able to walk/stand for 30 minutes without experiencing increased back or right leg pain to be able to complete short shopping trips.    Time 3   Period Weeks   Status On-going           PT Long Term Goals - 08/27/17 0854      PT LONG TERM GOAL #1   Title Pt pain in his back and right leg to be no greater than a 3/10 to allow pt to bring groceries into the house from his car in comfort.    Time 6   Period Weeks   Status On-going     PT LONG TERM GOAL #2   Title Pt to be able to complete light yard work for an hour without experiencing increased back or right leg pain    Time 6   Period Weeks   Status On-going     PT LONG TERM GOAL #3   Title PT to be able to sit for two hours for traveling without having increased back or right leg pain    Time 6   Period Weeks   Status On-going     PT LONG TERM GOAL #4   Title Pt to be able to sleep without having to take medication.    Time 6   Period Weeks  Status On-going               Plan -  09/04/17 3086    Clinical Impression Statement Session focus on improving abdominal activation and education on importance of posture.  Pt required verbal cueing to improve abdomonal activation and overall stability with decompression therex.  Held additional HEP with decompression due to cueing required for proper form.  Pt educated on importance of posture and lumbar roll support in seated position.     Rehab Potential Good   PT Frequency 2x / week   PT Duration 6 weeks   PT Treatment/Interventions Functional mobility training;Therapeutic activities;Therapeutic exercise;Balance training;Patient/family education;Manual techniques;Cryotherapy;Moist Heat;Iontophoresis 4mg /ml Dexamethasone;Ultrasound;Traction   PT Next Visit Plan conitnue to work on decompression therex with theraband ensuring proper technique and muscle activation (provide for HEP if pt able to do this); possibly talk about use of lumbar roll when sitting to address flexed posturing    PT Home Exercise Plan sitting tall, scapular retraction, pelvic tilt, knee to chest and active hamstring stretch; 8/28: bridge, bent knee raise and decompression 1-5        Patient will benefit from skilled therapeutic intervention in order to improve the following deficits and impairments:  Decreased activity tolerance, Decreased balance, Decreased range of motion, Decreased strength, Difficulty walking, Increased fascial restricitons, Postural dysfunction, Improper body mechanics, Pain  Visit Diagnosis: Right lumbar radiculopathy  Difficulty in walking, not elsewhere classified  Muscle weakness (generalized)     Problem List Patient Active Problem List   Diagnosis Date Noted  . Esophageal dysphagia 02/08/2016  . Hemorrhoids 02/08/2016  . Chronic radicular cervical pain 07/29/2014  . GERD (gastroesophageal reflux disease) 10/14/2012  . Constipation 10/14/2012  . Stiffness of joint, not elsewhere classified, other specified site  02/21/2012  . PUD (peptic ulcer disease) 12/21/2011  . Dysphagia 07/25/2011  . Hyperlipidemia 08/12/2009  . ESSENTIAL HYPERTENSION, BENIGN 08/12/2009  . CAD, NATIVE VESSEL 08/12/2009  . CUTANEOUS ERUPTIONS, DRUG-INDUCED 01/09/2008  . LOCALIZED SUPERFICIAL SWELLING MASS OR LUMP 01/09/2008   [name] [name], LPTA; CBIS [phone]  [name] [name] 09/04/2017, 10:18 AM  [facility] [address] [place], [place], [address] Phone: [phone]   Fax:  [phone]  Name: [name] [name] MRN: [id number] Date of Birth: 26-Dec-1951

## 2017-09-06 ENCOUNTER — Ambulatory Visit (HOSPITAL_COMMUNITY): Payer: Medicare Other

## 2017-09-06 DIAGNOSIS — R29898 Other symptoms and signs involving the musculoskeletal system: Secondary | ICD-10-CM

## 2017-09-06 DIAGNOSIS — M5416 Radiculopathy, lumbar region: Secondary | ICD-10-CM | POA: Diagnosis not present

## 2017-09-06 DIAGNOSIS — R262 Difficulty in walking, not elsewhere classified: Secondary | ICD-10-CM

## 2017-09-06 DIAGNOSIS — M6281 Muscle weakness (generalized): Secondary | ICD-10-CM | POA: Diagnosis not present

## 2017-09-06 NOTE — Therapy (Signed)
[name] [name], [place], [address] Phone: [phone]   Fax:  [phone]  Physical Therapy Treatment  Patient Details  Name: [name] [name] MRN: [id number] Date of Birth: February 17, 1951 Referring Provider: Dr. Mindi Curling   Encounter Date: 09/06/2017      PT End of Session - 09/06/17 0833    Visit Number 5   Number of Visits 12   Date for PT Re-Evaluation 09/22/17   Authorization Type medicare/ BCBS   Authorization - Visit Number 5   Authorization - Number of Visits 10   PT Start Time [phone]   PT Stop Time 0901   PT Time Calculation (min) 45 min   Activity Tolerance Patient tolerated treatment well   Behavior During Therapy [facility] for tasks assessed/performed      Past Medical History:  Diagnosis Date  . Anxiety   . Arthritis    back pain  . Avascular necrosis of bone of hip ([place])    right  . CAD (coronary artery disease)    stents x3 2008  . GERD (gastroesophageal reflux disease)   . History of nuclear stress test    a. Myoview 10/17: EF 56%, diaphragmatic attenuation, no ischemia, low risk  . HTN (hypertension)   . Hyperlipidemia   . Myocardial infarction   . Paresthesias     Past Surgical History:  Procedure Laterality Date  . CARDIAC CATHETERIZATION     3 stents placed  . COLONOSCOPY  2011 NUR [facility] SCREENING d50 v5   MILD  TICS, ? PREP ARTIFACT V. PROCTITIS-rX:[place]. next TCS 2021.  . ESOPHAGEAL DILATION N/A 03/02/2016   Procedure: ESOPHAGEAL DILATION;  Surgeon: Danie Binder, MD;  [facility];  Service: Endoscopy;  Laterality: N/A;  . ESOPHAGOGASTRODUODENOSCOPY  10/03/11   small hiatal hernia/gastric ulcers/stricutre in distal esophagus  . ESOPHAGOGASTRODUODENOSCOPY N/A 03/02/2016   Dr. Oneida Alar: Schatzki's ring at [place] junction s/p dilation, mild non-erosive gastritis/duodenitis   . MASS EXCISION Left 09/30/2014   Procedure: EXCISION MASS LEFT WRIST ;  Surgeon: Daryll Brod, MD;  Location:  [place];  Service: Orthopedics;  Laterality: Left;  . TONSILLECTOMY  age 70  . TOTAL HIP ARTHROPLASTY  age 16   right hip for avascular necrosis of hip and spine    There were no vitals filed for this visit.      Subjective Assessment - 09/06/17 0824    Subjective Pt stated he took Aleve and slept better last night.  Pt reports current pain scale 3/10 Rt side LBP and constant burning Rt anterior thigh.  Reports improved awareness of posture and compliant with HEP daily.   Pertinent History MRI:  bulging L3-4 and L4-L5 ; forminal narrowing at L5-S1;  Grade 2 anteriorlisthesis of L5S1    Patient Stated Goals keep the pain away   Currently in Pain? Yes   Pain Score 3    Pain Location Back   Pain Orientation Lower;Right   Pain Type Chronic pain   Pain Radiating Towards Rt anterior thigh burning   Pain Onset 1 to 4 weeks ago   Pain Frequency Constant   Aggravating Factors  always bothering him   Pain Relieving Factors not sure   Effect of Pain on Daily Activities increases                         [facility] Adult PT Treatment/Exercise - 09/06/17 0001      Lumbar  Exercises: Supine   Ab Set 10 reps   Clam 5 reps;3 seconds   Clam Limitations GTB   Bent Knee Raise 10 reps;3 seconds   Bridge 10 reps   Other Supine Lumbar Exercises Decompression exercises with theraband 1-4 x10 reps each      Lumbar Exercises: Sidelying   Hip Abduction 15 reps     Lumbar Exercises: Prone   Straight Leg Raise 10 reps   Straight Leg Raises Limitations (+) trunk rotation during Rt hip extension, requiring verbal cues to improve; pillow under hips                  PT Short Term Goals - 08/27/17 0854      PT SHORT TERM GOAL #1   Title Pt to demonstrate and be able to verbalize the importance of good sitting posture.    Time 3   Period Weeks   Status On-going     PT SHORT TERM GOAL #2   Title Pt to have no greater than a 5/10 pain in his back and Rt leg to  allow pt to sit for an hour to enjoy eating a meal    Time 3   Period Weeks   Status On-going     PT SHORT TERM GOAL #3   Title PT strength in his right leg to be increased by 1/2 grade to allow him to come from sit to stand from his couch with increase ease.    Time 3   Status On-going     PT SHORT TERM GOAL #4   Title Pt to be able to walk/stand for 30 minutes without experiencing increased back or right leg pain to be able to complete short shopping trips.    Time 3   Period Weeks   Status On-going           PT Long Term Goals - 08/27/17 0854      PT LONG TERM GOAL #1   Title Pt pain in his back and right leg to be no greater than a 3/10 to allow pt to bring groceries into the house from his car in comfort.    Time 6   Period Weeks   Status On-going     PT LONG TERM GOAL #2   Title Pt to be able to complete light yard work for an hour without experiencing increased back or right leg pain    Time 6   Period Weeks   Status On-going     PT LONG TERM GOAL #3   Title PT to be able to sit for two hours for traveling without having increased back or right leg pain    Time 6   Period Weeks   Status On-going     PT LONG TERM GOAL #4   Title Pt to be able to sleep without having to take medication.    Time 6   Period Weeks   Status On-going               Plan - 09/06/17 1950    Clinical Impression Statement Pt presents with improved awareness of posture and reports of decrease radicular symptoms ending at thigh level today.  Continued session focus on improving abdominal activation, proximal strengthening and overall stability training.  Pt improved form and technique with decompression exercises with theraband, given printout and band to add to HEP.  Pt reports interest in lumbar traction, begin trial next session.     Rehab Potential  Good   PT Frequency 2x / week   PT Duration 6 weeks   PT Treatment/Interventions Functional mobility training;Therapeutic  activities;Therapeutic exercise;Balance training;Patient/family education;Manual techniques;Cryotherapy;Moist Heat;Iontophoresis 4mg /ml Dexamethasone;Ultrasound;Traction   PT Next Visit Plan May attempt traction begin at 50# if traction next session.  Continue core stability and proximal strengthening.     PT Home Exercise Plan sitting tall, scapular retraction, pelvic tilt, knee to chest and active hamstring stretch; 8/28: bridge, bent knee raise and decompression 1-5; 09/06/17: decompression wiht theraband      Patient will benefit from skilled therapeutic intervention in order to improve the following deficits and impairments:  Decreased activity tolerance, Decreased balance, Decreased range of motion, Decreased strength, Difficulty walking, Increased fascial restricitons, Postural dysfunction, Improper body mechanics, Pain  Visit Diagnosis: Right lumbar radiculopathy  Difficulty in walking, not elsewhere classified  Muscle weakness (generalized)  Other symptoms and signs involving the musculoskeletal system     Problem List Patient Active Problem List   Diagnosis Date Noted  . Esophageal dysphagia 02/08/2016  . Hemorrhoids 02/08/2016  . Chronic radicular cervical pain 07/29/2014  . GERD (gastroesophageal reflux disease) 10/14/2012  . Constipation 10/14/2012  . Stiffness of joint, not elsewhere classified, other specified site 02/21/2012  . PUD (peptic ulcer disease) 12/21/2011  . Dysphagia 07/25/2011  . Hyperlipidemia 08/12/2009  . ESSENTIAL HYPERTENSION, BENIGN 08/12/2009  . CAD, NATIVE VESSEL 08/12/2009  . CUTANEOUS ERUPTIONS, DRUG-INDUCED 01/09/2008  . LOCALIZED SUPERFICIAL SWELLING MASS OR LUMP 01/09/2008   Ihor Austin, LPTA; CBIS [phone]  Aldona Lento 09/06/2017, 9:13 AM  [place], [place], [address] Phone: [phone]   Fax:  [phone]  Name: [name] [name] MRN:  [id number] Date of Birth: 30-Jun-1951

## 2017-09-09 ENCOUNTER — Ambulatory Visit (HOSPITAL_COMMUNITY): Payer: Medicare Other | Admitting: Physical Therapy

## 2017-09-09 DIAGNOSIS — R262 Difficulty in walking, not elsewhere classified: Secondary | ICD-10-CM

## 2017-09-09 DIAGNOSIS — R29898 Other symptoms and signs involving the musculoskeletal system: Secondary | ICD-10-CM | POA: Diagnosis not present

## 2017-09-09 DIAGNOSIS — M5416 Radiculopathy, lumbar region: Secondary | ICD-10-CM

## 2017-09-09 DIAGNOSIS — M6281 Muscle weakness (generalized): Secondary | ICD-10-CM | POA: Diagnosis not present

## 2017-09-09 NOTE — Therapy (Signed)
[place], [place], [address] Phone: [phone]   Fax:  [phone]  Physical Therapy Treatment  Patient Details  Name: [name] [name] MRN: [id number] Date of Birth: [birth year] Referring Provider: Dr. Mindi Curling   Encounter Date: 09/09/2017      PT End of Session - 09/09/17 0902    Visit Number 6   Number of Visits 12   Date for PT Re-Evaluation 09/22/17   Authorization Type medicare/ BCBS   Authorization - Visit Number 6   Authorization - Number of Visits 10   PT Start Time [phone]   PT Stop Time 0908   PT Time Calculation (min) 49 min   Activity Tolerance Patient tolerated treatment well   Behavior During Therapy [facility] for tasks assessed/performed      Past Medical History:  Diagnosis Date  . Anxiety   . Arthritis    back pain  . Avascular necrosis of bone of hip ([place])    right  . CAD (coronary artery disease)    stents x3 2008  . GERD (gastroesophageal reflux disease)   . History of nuclear stress test    a. Myoview 10/17: EF 56%, diaphragmatic attenuation, no ischemia, low risk  . HTN (hypertension)   . Hyperlipidemia   . Myocardial infarction   . Paresthesias     Past Surgical History:  Procedure Laterality Date  . CARDIAC CATHETERIZATION     3 stents placed  . COLONOSCOPY  2011 NUR [facility] SCREENING d50 v5   MILD [place] TICS, ? PREP ARTIFACT V. PROCTITIS-rX:[place]. next TCS 2021.  . ESOPHAGEAL DILATION N/A 03/02/2016   Procedure: ESOPHAGEAL DILATION;  Surgeon: Danie Binder, MD;  [facility];  Service: Endoscopy;  Laterality: N/A;  . ESOPHAGOGASTRODUODENOSCOPY  10/03/11   small hiatal hernia/gastric ulcers/stricutre in distal esophagus  . ESOPHAGOGASTRODUODENOSCOPY N/A 03/02/2016   Dr. Oneida Alar: Schatzki's ring at [place] junction s/p dilation, mild non-erosive gastritis/duodenitis   . MASS EXCISION Left 09/30/2014   Procedure: EXCISION MASS LEFT WRIST ;  Surgeon: Daryll Brod, MD;  Location:  [place];  Service: Orthopedics;  Laterality: Left;  . TONSILLECTOMY  age 22  . TOTAL HIP ARTHROPLASTY  age 65   right hip for avascular necrosis of hip and spine    There were no vitals filed for this visit.      Subjective Assessment - 09/09/17 0858    Subjective Pt states his back bothers him most at night when he's trying to sleep.  STates the radiculopathy is better, however still has burning into his anterior rt thigh.     Currently in Pain? Yes   Pain Score 3    Pain Location Back   Pain Orientation Lower;Right   Pain Descriptors / Indicators Aching;Burning                         [facility] Adult PT Treatment/Exercise - 09/09/17 0001      Lumbar Exercises: Supine   Ab Set 10 reps   Bridge 10 reps   Straight Leg Raise 5 reps     Lumbar Exercises: Sidelying   Hip Abduction 15 reps   Hip Abduction Limitations back against wall      Lumbar Exercises: Prone   Straight Leg Raise 10 reps   Straight Leg Raises Limitations (+) trunk rotation during Rt hip extension, requiring verbal cues to improve; pillow under hips   Other Prone Lumbar Exercises  postural:  shoulder extension, rows and w-back 10 reps each     Modalities   Modalities Traction     Traction   Type of Traction Lumbar   Min (lbs) 0   Max (lbs) 50   Hold Time static   Time 10 minutes                PT Education - 09/09/17 0910    Education provided Yes   Education Details explanation and treatment mechanism of mechanical traction.    Person(s) Educated Patient   Methods Explanation   Comprehension Verbalized understanding          PT Short Term Goals - 08/27/17 0854      PT SHORT TERM GOAL #1   Title Pt to demonstrate and be able to verbalize the importance of good sitting posture.    Time 3   Period Weeks   Status On-going     PT SHORT TERM GOAL #2   Title Pt to have no greater than a 5/10 pain in his back and Rt leg to allow pt to sit for an hour to  enjoy eating a meal    Time 3   Period Weeks   Status On-going     PT SHORT TERM GOAL #3   Title PT strength in his right leg to be increased by 1/2 grade to allow him to come from sit to stand from his couch with increase ease.    Time 3   Status On-going     PT SHORT TERM GOAL #4   Title Pt to be able to walk/stand for 30 minutes without experiencing increased back or right leg pain to be able to complete short shopping trips.    Time 3   Period Weeks   Status On-going           PT Long Term Goals - 08/27/17 0854      PT LONG TERM GOAL #1   Title Pt pain in his back and right leg to be no greater than a 3/10 to allow pt to bring groceries into the house from his car in comfort.    Time 6   Period Weeks   Status On-going     PT LONG TERM GOAL #2   Title Pt to be able to complete light yard work for an hour without experiencing increased back or right leg pain    Time 6   Period Weeks   Status On-going     PT LONG TERM GOAL #3   Title PT to be able to sit for two hours for traveling without having increased back or right leg pain    Time 6   Period Weeks   Status On-going     PT LONG TERM GOAL #4   Title Pt to be able to sleep without having to take medication.    Time 6   Period Weeks   Status On-going               Plan - 09/09/17 0903    Clinical Impression Statement Pt comes to therapy today with continued symptoms into his Rt thigh, however reports is more of a burn instead of pain.  Pt with improved stabilization with prone LE lifts this session.  Progressed with postural strengthening in prone positioning.  Trial of lumbar traction per POC with static hold of 50#.  (pt weighs 175#).  may increase weight next session    Rehab Potential Good  PT Frequency 2x / week   PT Duration 6 weeks   PT Treatment/Interventions Functional mobility training;Therapeutic activities;Therapeutic exercise;Balance training;Patient/family education;Manual  techniques;Cryotherapy;Moist Heat;Iontophoresis 4mg /ml Dexamethasone;Ultrasound;Traction   PT Next Visit Plan May increase traction # next session.  Continue core stability and proximal strengthening.     PT Home Exercise Plan sitting tall, scapular retraction, pelvic tilt, knee to chest and active hamstring stretch; 8/28: bridge, bent knee raise and decompression 1-5; 09/06/17: decompression wiht theraband      Patient will benefit from skilled therapeutic intervention in order to improve the following deficits and impairments:  Decreased activity tolerance, Decreased balance, Decreased range of motion, Decreased strength, Difficulty walking, Increased fascial restricitons, Postural dysfunction, Improper body mechanics, Pain  Visit Diagnosis: Right lumbar radiculopathy  Difficulty in walking, not elsewhere classified  Muscle weakness (generalized)     Problem List Patient Active Problem List   Diagnosis Date Noted  . Esophageal dysphagia 02/08/2016  . Hemorrhoids 02/08/2016  . Chronic radicular cervical pain 07/29/2014  . GERD (gastroesophageal reflux disease) 10/14/2012  . Constipation 10/14/2012  . Stiffness of joint, not elsewhere classified, other specified site 02/21/2012  . PUD (peptic ulcer disease) 12/21/2011  . Dysphagia 07/25/2011  . Hyperlipidemia 08/12/2009  . ESSENTIAL HYPERTENSION, BENIGN 08/12/2009  . CAD, NATIVE VESSEL 08/12/2009  . CUTANEOUS ERUPTIONS, DRUG-INDUCED 01/09/2008  . LOCALIZED SUPERFICIAL SWELLING MASS OR LUMP 01/09/2008   Teena Irani, PTA/CLT [phone]  Teena Irani 09/09/2017, 9:12 AM  [place], [place], [address] Phone: [phone]   Fax:  [phone]  Name: [name] [name] MRN: [id number] Date of Birth: 1951/05/17

## 2017-09-13 ENCOUNTER — Ambulatory Visit (HOSPITAL_COMMUNITY): Payer: Medicare Other | Admitting: Physical Therapy

## 2017-09-13 DIAGNOSIS — R262 Difficulty in walking, not elsewhere classified: Secondary | ICD-10-CM | POA: Diagnosis not present

## 2017-09-13 DIAGNOSIS — R29898 Other symptoms and signs involving the musculoskeletal system: Secondary | ICD-10-CM | POA: Diagnosis not present

## 2017-09-13 DIAGNOSIS — M5416 Radiculopathy, lumbar region: Secondary | ICD-10-CM

## 2017-09-13 DIAGNOSIS — M6281 Muscle weakness (generalized): Secondary | ICD-10-CM

## 2017-09-13 NOTE — Therapy (Signed)
[place], [place], [address] Phone: [phone]   Fax:  [phone]  Physical Therapy Treatment  Patient Details  Name: [name] [name] MRN: [id number] Date of Birth: 04-Sep-1951 Referring Provider: Dr. Mindi Curling   Encounter Date: 09/13/2017      PT End of Session - 09/13/17 0901    Visit Number 7   Number of Visits 12   Date for PT Re-Evaluation 09/22/17   Authorization Type medicare/ BCBS   Authorization - Visit Number 7   Authorization - Number of Visits 10   PT Start Time 0815   PT Stop Time 0910   PT Time Calculation (min) 55 min   Activity Tolerance Patient tolerated treatment well;No increased pain   Behavior During Therapy [facility] for tasks assessed/performed      Past Medical History:  Diagnosis Date  . Anxiety   . Arthritis    back pain  . Avascular necrosis of bone of hip ([place])    right  . CAD (coronary artery disease)    stents x3 2008  . GERD (gastroesophageal reflux disease)   . History of nuclear stress test    a. Myoview 10/17: EF 56%, diaphragmatic attenuation, no ischemia, low risk  . HTN (hypertension)   . Hyperlipidemia   . Myocardial infarction   . Paresthesias     Past Surgical History:  Procedure Laterality Date  . CARDIAC CATHETERIZATION     3 stents placed  . COLONOSCOPY  2011 NUR [facility] SCREENING d50 v5   MILD [place] TICS, ? PREP ARTIFACT V. PROCTITIS-rX:[place]. next TCS 2021.  . ESOPHAGEAL DILATION N/A 03/02/2016   Procedure: ESOPHAGEAL DILATION;  Surgeon: Danie Binder, MD;  [facility];  Service: Endoscopy;  Laterality: N/A;  . ESOPHAGOGASTRODUODENOSCOPY  10/03/11   small hiatal hernia/gastric ulcers/stricutre in distal esophagus  . ESOPHAGOGASTRODUODENOSCOPY N/A 03/02/2016   Dr. Oneida Alar: Schatzki's ring at [place] junction s/p dilation, mild non-erosive gastritis/duodenitis   . MASS EXCISION Left 09/30/2014   Procedure: EXCISION MASS LEFT WRIST ;  Surgeon: Daryll Brod, MD;  Location: [place];  Service: Orthopedics;  Laterality: Left;  . TONSILLECTOMY  age 54  . TOTAL HIP ARTHROPLASTY  age 35   right hip for avascular necrosis of hip and spine    There were no vitals filed for this visit.      Subjective Assessment - 09/13/17 0817    Subjective Pt states that he is feeling better today with pain level "about 2/10".    Pain Score 2    Pain Location Back   Pain Descriptors / Indicators Aching                         [facility] Adult PT Treatment/Exercise - 09/13/17 0001      Posture/Postural Control   Posture Comments --     Exercises   Exercises Lumbar     Lumbar Exercises: Stretches   Passive Hamstring Stretch 3 reps;30 seconds   Passive Hamstring Stretch Limitations bilaterally, moderate limitations   Lower Trunk Rotation Limitations 10 reps   Standing Side Bend 5 reps   Standing Extension 5 reps   Prone on Elbows Stretch 4 reps   Prone on Elbows Stretch Limitations 3 sec pause, transition into press up with 3 second hold   Press Ups 4 reps   Press Ups Limitations Significant limitations unable to maintain hips on table  Lumbar Exercises: Standing   Other Standing Lumbar Exercises wall arch x 5 reps, wall push ups x 5 reps      Lumbar Exercises: Prone   Straight Leg Raise 10 reps     Modalities   Modalities Traction     Traction   Type of Traction Lumbar   Min (lbs) 0   Max (lbs) 60   Hold Time Static   Time 10 minutes                  PT Short Term Goals - 09/13/17 0906      PT SHORT TERM GOAL #1   Title Pt to demonstrate and be able to verbalize the importance of good sitting posture.    Time 3   Period Weeks   Status On-going     PT SHORT TERM GOAL #2   Title Pt to have no greater than a 5/10 pain in his back and Rt leg to allow pt to sit for an hour to enjoy eating a meal    Time 3   Period Weeks   Status On-going     PT SHORT TERM GOAL #3   Title PT strength  in his right leg to be increased by 1/2 grade to allow him to come from sit to stand from his couch with increase ease.    Time 3   Status On-going     PT SHORT TERM GOAL #4   Title Pt to be able to walk/stand for 30 minutes without experiencing increased back or right leg pain to be able to complete short shopping trips.    Time 3   Period Weeks   Status On-going           PT Long Term Goals - 09/13/17 0906      PT LONG TERM GOAL #1   Title Pt pain in his back and right leg to be no greater than a 3/10 to allow pt to bring groceries into the house from his car in comfort.    Time 6   Period Weeks   Status On-going     PT LONG TERM GOAL #2   Title Pt to be able to complete light yard work for an hour without experiencing increased back or right leg pain    Time 6   Period Weeks   Status On-going     PT LONG TERM GOAL #3   Title PT to be able to sit for two hours for traveling without having increased back or right leg pain    Time 6   Period Weeks   Status On-going     PT LONG TERM GOAL #4   Title Pt to be able to sleep without having to take medication.    Time 6   Period Weeks   Status On-going               Plan - 09/13/17 0903    Clinical Impression Statement Pt tolerated session well. He presents today with decreased lumbar and thoracic mobility throughout stretching exercises in standing, prone and supine. Pt improved with verbal and tactile cueing during prone trunk extension exercises with increased postural control and posture. Pt continues to benefit from skilled PT to address core/trunk strength and mobility to decrease lumbar pain.   Clinical Presentation Stable   Rehab Potential Good   PT Frequency 2x / week   PT Duration 6 weeks   PT Treatment/Interventions Functional mobility training;Therapeutic activities;Therapeutic exercise;Balance  training;Patient/family education;Manual techniques;Cryotherapy;Moist Heat;Iontophoresis 4mg /ml  Dexamethasone;Ultrasound;Traction   PT Next Visit Plan May increase traction # next session.  Continue core stability and proximal strengthening.     PT Home Exercise Plan sitting tall, scapular retraction, pelvic tilt, knee to chest and active hamstring stretch; 8/28: bridge, bent knee raise and decompression 1-5; 09/06/17: decompression wiht theraband   Consulted and Agree with Plan of Care Patient      Patient will benefit from skilled therapeutic intervention in order to improve the following deficits and impairments:  Decreased activity tolerance, Decreased balance, Decreased range of motion, Decreased strength, Difficulty walking, Increased fascial restricitons, Postural dysfunction, Improper body mechanics, Pain  Visit Diagnosis: Right lumbar radiculopathy  Difficulty in walking, not elsewhere classified  Muscle weakness (generalized)     Problem List Patient Active Problem List   Diagnosis Date Noted  . Esophageal dysphagia 02/08/2016  . Hemorrhoids 02/08/2016  . Chronic radicular cervical pain 07/29/2014  . GERD (gastroesophageal reflux disease) 10/14/2012  . Constipation 10/14/2012  . Stiffness of joint, not elsewhere classified, other specified site 02/21/2012  . PUD (peptic ulcer disease) 12/21/2011  . Dysphagia 07/25/2011  . Hyperlipidemia 08/12/2009  . ESSENTIAL HYPERTENSION, BENIGN 08/12/2009  . CAD, NATIVE VESSEL 08/12/2009  . CUTANEOUS ERUPTIONS, DRUG-INDUCED 01/09/2008  . LOCALIZED SUPERFICIAL SWELLING MASS OR LUMP 01/09/2008    Rayetta Humphrey, PT CLT [phone] 09/13/2017, 9:10 AM  [place] [address] [place], [place], [address] Phone: [phone]   Fax:  [phone]  Name: [name] [name] MRN: [id number] Date of Birth: 1951-10-15

## 2017-09-17 ENCOUNTER — Ambulatory Visit (HOSPITAL_COMMUNITY): Payer: Medicare Other | Admitting: Physical Therapy

## 2017-09-17 DIAGNOSIS — M5416 Radiculopathy, lumbar region: Secondary | ICD-10-CM | POA: Diagnosis not present

## 2017-09-17 DIAGNOSIS — M6281 Muscle weakness (generalized): Secondary | ICD-10-CM | POA: Diagnosis not present

## 2017-09-17 DIAGNOSIS — R262 Difficulty in walking, not elsewhere classified: Secondary | ICD-10-CM | POA: Diagnosis not present

## 2017-09-17 DIAGNOSIS — R29898 Other symptoms and signs involving the musculoskeletal system: Secondary | ICD-10-CM | POA: Diagnosis not present

## 2017-09-17 NOTE — Therapy (Signed)
[place], [place], [address] Phone: [phone]   Fax:  [phone]  Physical Therapy Treatment  Patient Details  Name: [name] [name] MRN: [id number] Date of Birth: May 16, 1951 Referring Provider: Dr. Mindi Curling   Encounter Date: 09/17/2017      PT End of Session - 09/17/17 0845    Visit Number 8   Number of Visits 12   Date for PT Re-Evaluation 09/22/17   Authorization Type medicare/ BCBS   Authorization - Visit Number 8   Authorization - Number of Visits 10   PT Start Time 0820   PT Stop Time 0910   PT Time Calculation (min) 50 min   Activity Tolerance Patient tolerated treatment well;No increased pain   Behavior During Therapy WFL for tasks assessed/performed      Past Medical History:  Diagnosis Date  . Anxiety   . Arthritis    back pain  . Avascular necrosis of bone of hip ([place])    right  . CAD (coronary artery disease)    stents x3 2008  . GERD (gastroesophageal reflux disease)   . History of nuclear stress test    a. Myoview 10/17: EF 56%, diaphragmatic attenuation, no ischemia, low risk  . HTN (hypertension)   . Hyperlipidemia   . Myocardial infarction   . Paresthesias     Past Surgical History:  Procedure Laterality Date  . CARDIAC CATHETERIZATION     3 stents placed  . COLONOSCOPY  2011 NUR [facility] SCREENING d50 v5   MILD [place] TICS, ? PREP ARTIFACT V. PROCTITIS-rX:[place]. next TCS 2021.  . ESOPHAGEAL DILATION N/A 03/02/2016   Procedure: ESOPHAGEAL DILATION;  Surgeon: Danie Binder, MD;  [facility];  Service: Endoscopy;  Laterality: N/A;  . ESOPHAGOGASTRODUODENOSCOPY  10/03/11   small hiatal hernia/gastric ulcers/stricutre in distal esophagus  . ESOPHAGOGASTRODUODENOSCOPY N/A 03/02/2016   Dr. Oneida Alar: Schatzki's ring at [place] junction s/p dilation, mild non-erosive gastritis/duodenitis   . MASS EXCISION Left 09/30/2014   Procedure: EXCISION MASS LEFT WRIST ;  Surgeon: Daryll Brod, MD;  Location: [place];  Service: Orthopedics;  Laterality: Left;  . TONSILLECTOMY  age 66  . TOTAL HIP ARTHROPLASTY  age 66   right hip for avascular necrosis of hip and spine    There were no vitals filed for this visit.      Subjective Assessment - 09/17/17 0824    Subjective Pt states the traction is helping.  Reports he has less radiculopathy.  States night is the worse getting comfortable and then the stiffness in the morning.  Currently with 2/10 pain and relatively remains at a 2/10.    Currently in Pain? Yes   Pain Score 2    Pain Location Back   Pain Orientation Right;Lower   Pain Descriptors / Indicators Aching   Pain Type Chronic pain                         [facility] Adult PT Treatment/Exercise - 09/17/17 0001      Lumbar Exercises: Stretches   Passive Hamstring Stretch 30 seconds;2 reps;5 reps   Passive Hamstring Stretch Limitations bilaterally, moderate limitations   Lower Trunk Rotation Limitations 10 reps   Prone on Elbows Stretch Limitations   Prone on Elbows Stretch Limitations 5 minutes   Press Ups 5 reps   Press Ups Limitations improved mobiltiy      Lumbar Exercises: Supine  Bridge 10 reps   Straight Leg Raise 10 reps     Lumbar Exercises: Prone   Straight Leg Raise 10 reps   Straight Leg Raises Limitations with stabilization     Modalities   Modalities Traction     Traction   Type of Traction Lumbar   Min (lbs) 0   Max (lbs) 70   Hold Time Static   Time 10 minutes                  PT Short Term Goals - 09/13/17 0906      PT SHORT TERM GOAL #1   Title Pt to demonstrate and be able to verbalize the importance of good sitting posture.    Time 3   Period Weeks   Status On-going     PT SHORT TERM GOAL #2   Title Pt to have no greater than a 5/10 pain in his back and Rt leg to allow pt to sit for an hour to enjoy eating a meal    Time 3   Period Weeks   Status On-going     PT SHORT TERM  GOAL #3   Title PT strength in his right leg to be increased by 1/2 grade to allow him to come from sit to stand from his couch with increase ease.    Time 3   Status On-going     PT SHORT TERM GOAL #4   Title Pt to be able to walk/stand for 30 minutes without experiencing increased back or right leg pain to be able to complete short shopping trips.    Time 3   Period Weeks   Status On-going           PT Long Term Goals - 09/13/17 0906      PT LONG TERM GOAL #1   Title Pt pain in his back and right leg to be no greater than a 3/10 to allow pt to bring groceries into the house from his car in comfort.    Time 6   Period Weeks   Status On-going     PT LONG TERM GOAL #2   Title Pt to be able to complete light yard work for an hour without experiencing increased back or right leg pain    Time 6   Period Weeks   Status On-going     PT LONG TERM GOAL #3   Title PT to be able to sit for two hours for traveling without having increased back or right leg pain    Time 6   Period Weeks   Status On-going     PT LONG TERM GOAL #4   Title Pt to be able to sleep without having to take medication.    Time 6   Period Weeks   Status On-going               Plan - 09/17/17 0931    Clinical Impression Statement Pt progressing well with reduced radiculopathy and increasing lumbar stab.  continues to require cues to complete hamstring stretch correclty and stabilize with additional therex.  Increased to 70 pounds with traction this session .  May go as high as 85# (pt weighs 175#).   Rehab Potential Good   PT Frequency 2x / week   PT Duration 6 weeks   PT Treatment/Interventions Functional mobility training;Therapeutic activities;Therapeutic exercise;Balance training;Patient/family education;Manual techniques;Cryotherapy;Moist Heat;Iontophoresis 4mg /ml Dexamethasone;Ultrasound;Traction   PT Next Visit Plan May increase traction # next session.  Continue core stability and proximal  strengthening.     PT Home Exercise Plan sitting tall, scapular retraction, pelvic tilt, knee to chest and active hamstring stretch; 8/28: bridge, bent knee raise and decompression 1-5; 09/06/17: decompression wiht theraband   Consulted and Agree with Plan of Care Patient      Patient will benefit from skilled therapeutic intervention in order to improve the following deficits and impairments:  Decreased activity tolerance, Decreased balance, Decreased range of motion, Decreased strength, Difficulty walking, Increased fascial restricitons, Postural dysfunction, Improper body mechanics, Pain  Visit Diagnosis: Right lumbar radiculopathy  Difficulty in walking, not elsewhere classified  Muscle weakness (generalized)  Other symptoms and signs involving the musculoskeletal system     Problem List Patient Active Problem List   Diagnosis Date Noted  . Esophageal dysphagia 02/08/2016  . Hemorrhoids 02/08/2016  . Chronic radicular cervical pain 07/29/2014  . GERD (gastroesophageal reflux disease) 10/14/2012  . Constipation 10/14/2012  . Stiffness of joint, not elsewhere classified, other specified site 02/21/2012  . PUD (peptic ulcer disease) 12/21/2011  . Dysphagia 07/25/2011  . Hyperlipidemia 08/12/2009  . ESSENTIAL HYPERTENSION, BENIGN 08/12/2009  . CAD, NATIVE VESSEL 08/12/2009  . CUTANEOUS ERUPTIONS, DRUG-INDUCED 01/09/2008  . LOCALIZED SUPERFICIAL SWELLING MASS OR LUMP 01/09/2008   Teena Irani, PTA/CLT [phone]   Teena Irani 09/17/2017, 9:34 AM  [facility] [address] [place], [place], [address] Phone: [phone]   Fax:  [phone]  Name: CAEDAN SUMLER MRN: [id number] Date of Birth: [birth year]

## 2017-09-19 ENCOUNTER — Ambulatory Visit (HOSPITAL_COMMUNITY): Payer: Medicare Other | Admitting: Physical Therapy

## 2017-09-19 DIAGNOSIS — R29898 Other symptoms and signs involving the musculoskeletal system: Secondary | ICD-10-CM

## 2017-09-19 DIAGNOSIS — M5416 Radiculopathy, lumbar region: Secondary | ICD-10-CM | POA: Diagnosis not present

## 2017-09-19 DIAGNOSIS — M6281 Muscle weakness (generalized): Secondary | ICD-10-CM | POA: Diagnosis not present

## 2017-09-19 DIAGNOSIS — R262 Difficulty in walking, not elsewhere classified: Secondary | ICD-10-CM

## 2017-09-19 NOTE — Therapy (Signed)
[facility] [address] [place], [place], [address] Phone: [phone]   Fax:  [phone]  Physical Therapy Treatment  Patient Details  Name: [name] [name] MRN: [id number] Date of Birth: Nov 19, 1951 Referring Provider: Dr. Mindi Curling   Encounter Date: 09/19/2017      PT End of Session - 09/19/17 1017    Visit Number 9   Number of Visits 12   Date for PT Re-Evaluation 09/22/17   Authorization Type medicare/ BCBS   Authorization - Visit Number 9   Authorization - Number of Visits 10   PT Start Time [phone]  pt was late   PT Stop Time 0914   PT Time Calculation (min) 51 min   Equipment Utilized During Treatment Other (comment)   Activity Tolerance Patient tolerated treatment well;No increased pain   Behavior During Therapy WFL for tasks assessed/performed      Past Medical History:  Diagnosis Date  . Anxiety   . Arthritis    back pain  . Avascular necrosis of bone of hip ([place])    right  . CAD (coronary artery disease)    stents x3 2008  . GERD (gastroesophageal reflux disease)   . History of nuclear stress test    a. Myoview 10/17: EF 56%, diaphragmatic attenuation, no ischemia, low risk  . HTN (hypertension)   . Hyperlipidemia   . Myocardial infarction   . Paresthesias     Past Surgical History:  Procedure Laterality Date  . CARDIAC CATHETERIZATION     3 stents placed  . COLONOSCOPY  2011 NUR [facility] SCREENING d50 v5   MILD [place] TICS, ? PREP ARTIFACT V. PROCTITIS-rX:[place]. next TCS 2021.  . ESOPHAGEAL DILATION N/A 03/02/2016   Procedure: ESOPHAGEAL DILATION;  Surgeon: Danie Binder, MD;  [facility];  Service: Endoscopy;  Laterality: N/A;  . ESOPHAGOGASTRODUODENOSCOPY  10/03/11   small hiatal hernia/gastric ulcers/stricutre in distal esophagus  . ESOPHAGOGASTRODUODENOSCOPY N/A 03/02/2016   Dr. Oneida Alar: Schatzki's ring at [place] junction s/p dilation, mild non-erosive gastritis/duodenitis   . MASS EXCISION Left  09/30/2014   Procedure: EXCISION MASS LEFT WRIST ;  Surgeon: Daryll Brod, MD;  Location: [facility];  Service: Orthopedics;  Laterality: Left;  . TONSILLECTOMY  age 52  . TOTAL HIP ARTHROPLASTY  age 89   right hip for avascular necrosis of hip and spine    There were no vitals filed for this visit.      Subjective Assessment - 09/19/17 1014    Subjective Pt reports traction continues to reduce symptoms, however always has a dull pain of 2/10 related to general stiffness.   Currently in Pain? Yes   Pain Score 2    Pain Location Back   Pain Orientation Right;Lower   Pain Type Chronic pain                         [facility] Adult PT Treatment/Exercise - 09/19/17 1015      Lumbar Exercises: Stretches   Prone on Elbows Stretch Limitations 2 minutes     Lumbar Exercises: Supine   Bridge 15 reps   Straight Leg Raise 10 reps     Lumbar Exercises: Prone   Straight Leg Raise 15 reps   Straight Leg Raises Limitations with stabilization   Other Prone Lumbar Exercises postural:  shoulder extension, rows and w-back 15 reps each     Modalities   Modalities Traction     Traction  Type of Traction Lumbar   Min (lbs) 0   Max (lbs) 75   Hold Time Static   Time 10 minutes                  PT Short Term Goals - 09/13/17 0906      PT SHORT TERM GOAL #1   Title Pt to demonstrate and be able to verbalize the importance of good sitting posture.    Time 3   Period Weeks   Status On-going     PT SHORT TERM GOAL #2   Title Pt to have no greater than a 5/10 pain in his back and Rt leg to allow pt to sit for an hour to enjoy eating a meal    Time 3   Period Weeks   Status On-going     PT SHORT TERM GOAL #3   Title PT strength in his right leg to be increased by 1/2 grade to allow him to come from sit to stand from his couch with increase ease.    Time 3   Status On-going     PT SHORT TERM GOAL #4   Title Pt to be able to walk/stand for 30  minutes without experiencing increased back or right leg pain to be able to complete short shopping trips.    Time 3   Period Weeks   Status On-going           PT Long Term Goals - 09/13/17 0906      PT LONG TERM GOAL #1   Title Pt pain in his back and right leg to be no greater than a 3/10 to allow pt to bring groceries into the house from his car in comfort.    Time 6   Period Weeks   Status On-going     PT LONG TERM GOAL #2   Title Pt to be able to complete light yard work for an hour without experiencing increased back or right leg pain    Time 6   Period Weeks   Status On-going     PT LONG TERM GOAL #3   Title PT to be able to sit for two hours for traveling without having increased back or right leg pain    Time 6   Period Weeks   Status On-going     PT LONG TERM GOAL #4   Title Pt to be able to sleep without having to take medication.    Time 6   Period Weeks   Status On-going               Plan - 09/19/17 1020    Clinical Impression Statement continued focus on core and postural strengthening.  Increased reps of therex and increased weight to 75# max pull with traction.  Reported some discomfort with POE after 2 minutes so ceased activity.  No issues at end of session.    Rehab Potential Good   PT Frequency 2x / week   PT Duration 6 weeks   PT Treatment/Interventions Functional mobility training;Therapeutic activities;Therapeutic exercise;Balance training;Patient/family education;Manual techniques;Cryotherapy;Moist Heat;Iontophoresis 4mg /ml Dexamethasone;Ultrasound;Traction   PT Next Visit Plan May increase traction to max of 85# per tolerance.  Continue core stability and proximal strengthening.     PT Home Exercise Plan sitting tall, scapular retraction, pelvic tilt, knee to chest and active hamstring stretch; 8/28: bridge, bent knee raise and decompression 1-5; 09/06/17: decompression wiht theraband   Consulted and Agree with Plan of Care Patient  Patient will benefit from skilled therapeutic intervention in order to improve the following deficits and impairments:  Decreased activity tolerance, Decreased balance, Decreased range of motion, Decreased strength, Difficulty walking, Increased fascial restricitons, Postural dysfunction, Improper body mechanics, Pain  Visit Diagnosis: Right lumbar radiculopathy  Difficulty in walking, not elsewhere classified  Muscle weakness (generalized)  Other symptoms and signs involving the musculoskeletal system     Problem List Patient Active Problem List   Diagnosis Date Noted  . Esophageal dysphagia 02/08/2016  . Hemorrhoids 02/08/2016  . Chronic radicular cervical pain 07/29/2014  . GERD (gastroesophageal reflux disease) 10/14/2012  . Constipation 10/14/2012  . Stiffness of joint, not elsewhere classified, other specified site 02/21/2012  . PUD (peptic ulcer disease) 12/21/2011  . Dysphagia 07/25/2011  . Hyperlipidemia 08/12/2009  . ESSENTIAL HYPERTENSION, BENIGN 08/12/2009  . CAD, NATIVE VESSEL 08/12/2009  . CUTANEOUS ERUPTIONS, DRUG-INDUCED 01/09/2008  . LOCALIZED SUPERFICIAL SWELLING MASS OR LUMP 01/09/2008   Teena Irani, PTA/CLT [phone]  Teena Irani 09/19/2017, 10:26 AM  [facility] [place], [place], [address] Phone: [phone]   Fax:  [phone]  Name: [name] [name] MRN: [id number] Date of Birth: 09-29-1951

## 2017-09-24 ENCOUNTER — Ambulatory Visit (HOSPITAL_COMMUNITY): Payer: Medicare Other | Admitting: Physical Therapy

## 2017-09-24 DIAGNOSIS — M6281 Muscle weakness (generalized): Secondary | ICD-10-CM | POA: Diagnosis not present

## 2017-09-24 DIAGNOSIS — R262 Difficulty in walking, not elsewhere classified: Secondary | ICD-10-CM

## 2017-09-24 DIAGNOSIS — M5416 Radiculopathy, lumbar region: Secondary | ICD-10-CM

## 2017-09-24 DIAGNOSIS — R29898 Other symptoms and signs involving the musculoskeletal system: Secondary | ICD-10-CM | POA: Diagnosis not present

## 2017-09-24 NOTE — Therapy (Signed)
[place], [place], [address] Phone: [phone]   Fax:  [phone]  Physical Therapy Treatment  Patient Details  Name: [name] [name] MRN: [id number] Date of Birth: Dec 26, 1951 Referring Provider: Dr. Mindi Curling   Encounter Date: 09/24/2017      PT End of Session - 09/24/17 0848    Visit Number 10   Number of Visits 12   Date for PT Re-Evaluation 09/22/17   Authorization Type medicare/ BCBS   Authorization - Visit Number 10   Authorization - Number of Visits 10   PT Start Time 0818   PT Stop Time 0910   PT Time Calculation (min) 52 min   Equipment Utilized During Treatment Other (comment)   Activity Tolerance Patient tolerated treatment well;No increased pain   Behavior During Therapy WFL for tasks assessed/performed      Past Medical History:  Diagnosis Date  . Anxiety   . Arthritis    back pain  . Avascular necrosis of bone of hip ([place])    right  . CAD (coronary artery disease)    stents x3 2008  . GERD (gastroesophageal reflux disease)   . History of nuclear stress test    a. Myoview 10/17: EF 56%, diaphragmatic attenuation, no ischemia, low risk  . HTN (hypertension)   . Hyperlipidemia   . Myocardial infarction   . Paresthesias     Past Surgical History:  Procedure Laterality Date  . CARDIAC CATHETERIZATION     3 stents placed  . COLONOSCOPY  2011 NUR [facility] SCREENING d50 v5   MILD [place] TICS, ? PREP ARTIFACT V. PROCTITIS-rX:[place]. next TCS 2021.  . ESOPHAGEAL DILATION N/A 03/02/2016   Procedure: ESOPHAGEAL DILATION;  Surgeon: Danie Binder, MD;  [facility];  Service: Endoscopy;  Laterality: N/A;  . ESOPHAGOGASTRODUODENOSCOPY  10/03/11   small hiatal hernia/gastric ulcers/stricutre in distal esophagus  . ESOPHAGOGASTRODUODENOSCOPY N/A 03/02/2016   Dr. Oneida Alar: Schatzki's ring at [place] junction s/p dilation, mild non-erosive gastritis/duodenitis   . MASS EXCISION Left 09/30/2014    Procedure: EXCISION MASS LEFT WRIST ;  Surgeon: Daryll Brod, MD;  Location: [facility];  Service: Orthopedics;  Laterality: Left;  . TONSILLECTOMY  age 43  . TOTAL HIP ARTHROPLASTY  age 66   right hip for avascular necrosis of hip and spine    There were no vitals filed for this visit.      Subjective Assessment - 09/24/17 0821    Subjective Pt states that currently the pain is about a 2/10.  He is doing some exercises once a day    Pertinent History MRI:  bulging L3-4 and L4-L5 ; forminal narrowing at L5-S1;  Grade 2 anteriorlisthesis of L5S1    How long can you sit comfortably? Pt is able to sit for a chair for an hour on the couch for 30 minutes.    How long can you stand comfortably? Able to stand for 10 minutes   How long can you walk comfortably? Pt walks the dogs everyday for 45 minutes    Patient Stated Goals keep the pain away   Currently in Pain? Yes   Pain Score 2    Pain Location Back   Pain Orientation Right   Pain Descriptors / Indicators Aching   Pain Radiating Towards radicular pain is better no pain now.    Pain Onset 1 to 4 weeks ago   Aggravating Factors  sitting still   Pain Relieving  Factors stretches             [facility] PT Assessment - 09/24/17 0001      Assessment   Medical Diagnosis Low back pain; spondololisthesis   Referring Provider Dr. Mindi Curling    Onset Date/Surgical Date 07/31/17   Next MD Visit 09/19/2017   Prior Therapy years ago      Precautions   Precautions None     Restrictions   Weight Bearing Restrictions No     Prior Function   Level of Independence Independent   Vocation Retired     Associate Professor   Overall Cognitive Status Within Functional Limits for tasks assessed     Observation/Other Assessments   Focus on Therapeutic Outcomes (FOTO)  60  was 53     Functional Tests   Functional tests Single leg stance;Sit to Stand     Single Leg Stance   Comments Rt  33 was 10 seconds LT 33 was 16     Sit to Stand    Comments 5x  13.7 was 18.88     Posture/Postural Control   Posture/Postural Control Postural limitations   Postural Limitations Rounded Shoulders;Forward head;Decreased lumbar lordosis;Increased thoracic kyphosis     AROM   Lumbar Flexion normal with reps causing improved pain.    Lumbar Extension decreased 50% with reps causing increased pain      Strength   Right Hip Extension 4-/5  was 3/5    Right Hip ABduction 5/5  was 3+/5    Left Hip Extension 4-/5  was3/5   Left Hip ABduction 5/5  was 3+/5    Right Knee Flexion 5/5  3+/5    Right Knee Extension 5/5   Left Knee Flexion 5/5   Left Knee Extension 5/5  was 3+5    Right Ankle Dorsiflexion 5/5   Left Ankle Dorsiflexion 5/5     Flexibility   Soft Tissue Assessment /Muscle Length yes   Hamstrings Rt: 145; Lt 145 no change                      [facility] Adult PT Treatment/Exercise - 09/24/17 0001      Lumbar Exercises: Stretches   Active Hamstring Stretch 3 reps;30 seconds   Active Hamstring Stretch Limitations supine   Prone on Elbows Stretch Limitations 2 minutes   Press Ups 5 reps   Piriformis Stretch Limitations hip flexor stretch x 30" x 3 B      Lumbar Exercises: Prone   Straight Leg Raise 10 reps   Straight Leg Raises Limitations with stabilization     Modalities   Modalities Traction     Traction   Type of Traction Lumbar   Min (lbs) 0   Max (lbs) 80   Hold Time Static   Time 10 minutes                  PT Short Term Goals - 09/24/17 0844      PT SHORT TERM GOAL #1   Title Pt to demonstrate and be able to verbalize the importance of good sitting posture.    Time 3   Period Weeks   Status Achieved     PT SHORT TERM GOAL #2   Title Pt to have no greater than a 5/10 pain in his back and Rt leg to allow pt to sit for an hour to enjoy eating a meal    Time 3   Period Weeks   Status Achieved  PT SHORT TERM GOAL #3   Title PT strength in his right leg to be increased by  1/2 grade to allow him to come from sit to stand from his couch with increase ease.    Time 3   Status Achieved     PT SHORT TERM GOAL #4   Title Pt to be able to walk/stand for 30 minutes without experiencing increased back or right leg pain to be able to complete short shopping trips.    Time 3   Period Weeks   Status Achieved           PT Long Term Goals - 09/24/17 0845      PT LONG TERM GOAL #1   Title Pt pain in his back and right leg to be no greater than a 3/10 to allow pt to bring groceries into the house from his car in comfort.    Baseline 9/25: pain has been at a 4/10   Time 6   Period Weeks   Status Partially Met     PT LONG TERM GOAL #2   Title Pt to be able to complete light yard work for an hour without experiencing increased back or right leg pain    Time 6   Period Weeks   Status Achieved     PT LONG TERM GOAL #3   Title PT to be able to sit for two hours for traveling without having increased back or right leg pain    Baseline 9/25: depends on what he is sitting on    Time 6   Period Weeks   Status Partially Met     PT LONG TERM GOAL #4   Title Pt to be able to sleep without having to take medication.    Time 6   Period Weeks   Status Achieved               Plan - 09/24/17 0849    Clinical Impression Statement [name] [name] was reassessed with significant improvement in pain and strength but continues to be limited in ROM and still has back pain although the radicular pain has decreased.  He should be ready for discharge as planned in two visits.    Rehab Potential Good   PT Frequency 2x / week   PT Duration 6 weeks   PT Treatment/Interventions Functional mobility training;Therapeutic activities;Therapeutic exercise;Balance training;Patient/family education;Manual techniques;Cryotherapy;Moist Heat;Iontophoresis 24m/ml Dexamethasone;Ultrasound;Traction   PT Next Visit Plan May increase traction to max of 85# per tolerance increased to 80#  today..  Concentrate on flexibilty vs strengthening except for hip extensors.    PT Home Exercise Plan sitting tall, scapular retraction, pelvic tilt, knee to chest and active hamstring stretch; 8/28: bridge, bent knee raise and decompression 1-5; 09/06/17: decompression wiht theraband   Consulted and Agree with Plan of Care Patient      Patient will benefit from skilled therapeutic intervention in order to improve the following deficits and impairments:  Decreased activity tolerance, Decreased balance, Decreased range of motion, Decreased strength, Difficulty walking, Increased fascial restricitons, Postural dysfunction, Improper body mechanics, Pain  Visit Diagnosis: Right lumbar radiculopathy  Difficulty in walking, not elsewhere classified  Muscle weakness (generalized)     Problem List Patient Active Problem List   Diagnosis Date Noted  . Esophageal dysphagia 02/08/2016  . Hemorrhoids 02/08/2016  . Chronic radicular cervical pain 07/29/2014  . GERD (gastroesophageal reflux disease) 10/14/2012  . Constipation 10/14/2012  . Stiffness of joint, not elsewhere classified, other specified  site 02/21/2012  . PUD (peptic ulcer disease) 12/21/2011  . Dysphagia 07/25/2011  . Hyperlipidemia 08/12/2009  . ESSENTIAL HYPERTENSION, BENIGN 08/12/2009  . CAD, NATIVE VESSEL 08/12/2009  . CUTANEOUS ERUPTIONS, DRUG-INDUCED 01/09/2008  . LOCALIZED SUPERFICIAL SWELLING MASS OR LUMP 01/09/2008    Rayetta Humphrey, PT CLT [phone] 09/24/2017, 12:17 PM  [facility] [address] [place], [place], [address] Phone: [phone]   Fax:  [phone]  Name: [name] [name] MRN: [id number] Date of Birth: [birth year]

## 2017-09-24 NOTE — Patient Instructions (Signed)
Hamstring Stretch: Active    Support behind right knee. Starting with knee bent, attempt to straighten knee until a comfortable stretch is felt in back of thigh. Hold ___30_ seconds. Repeat ___3_ times per set. Do __1__ sets per session. Do ___2_ sessions per day.  http://orth.exer.us/158   Copyright  VHI. All rights reserved.  On Elbows (Prone)    Rise up on elbows as high as possible, keeping hips on floor. Hold _2___ minutes. Repeat __1__ times per set. Do ___1_ sets per session. Do _2___ sessions per day.  http://orth.exer.us/92   Copyright  VHI. All rights reserved.  Press-Up    Press upper body upward, keeping hips in contact with floor. Keep lower back and buttocks relaxed. Hold __30__ seconds. Repeat _3___ times per set. Do __1__ sets per session. Do __2 http://orth.exer.us/94   Copyright  VHI. All rights reserved.  Flexors, Standing    Stand, one foot on chair. Use support for balance, if needed. Slowly shift weight toward raised knee until stretch is felt in quadriceps of standing leg and hamstrings of forward leg. Hold _30__ seconds.  Repeat __3_ times per session. Do _1__ sessions per day.  Copyright  VHI. All rights reserved.  Hip Extension (Prone)    Lift left leg _3___ inches from floor, keeping knee locked. Repeat _10___ times per set. Do ___1_ sets per session. Do _2___ sessions per day.  http://orth.exer.us/98   Copyright  VHI. All rights reserved.  Scapular Retraction (Standing)    With arms at sides, pinch shoulder blades together. Repeat __10__ times per set. Do __1__ sets per session. Do ___2 sessions per day.  http://orth.exer.us/944   Copyright  VHI. All rights reserved.

## 2017-09-26 ENCOUNTER — Encounter (HOSPITAL_COMMUNITY): Payer: Medicare Other

## 2017-09-30 ENCOUNTER — Encounter (HOSPITAL_COMMUNITY): Payer: Self-pay | Admitting: Emergency Medicine

## 2017-09-30 ENCOUNTER — Ambulatory Visit (HOSPITAL_COMMUNITY): Payer: Medicare Other | Attending: Neurosurgery | Admitting: Physical Therapy

## 2017-09-30 ENCOUNTER — Emergency Department (HOSPITAL_COMMUNITY)
Admission: EM | Admit: 2017-09-30 | Discharge: 2017-09-30 | Disposition: A | Discharge status: Home / Self Care | Payer: Medicare Other | Attending: Emergency Medicine | Admitting: Emergency Medicine

## 2017-09-30 DIAGNOSIS — Z87891 Personal history of nicotine dependence: Secondary | ICD-10-CM | POA: Diagnosis not present

## 2017-09-30 DIAGNOSIS — Z96641 Presence of right artificial hip joint: Secondary | ICD-10-CM | POA: Diagnosis not present

## 2017-09-30 DIAGNOSIS — Z79899 Other long term (current) drug therapy: Secondary | ICD-10-CM | POA: Insufficient documentation

## 2017-09-30 DIAGNOSIS — R0600 Dyspnea, unspecified: Secondary | ICD-10-CM | POA: Diagnosis not present

## 2017-09-30 DIAGNOSIS — I10 Essential (primary) hypertension: Secondary | ICD-10-CM | POA: Insufficient documentation

## 2017-09-30 DIAGNOSIS — R0602 Shortness of breath: Secondary | ICD-10-CM | POA: Diagnosis not present

## 2017-09-30 DIAGNOSIS — I2510 Atherosclerotic heart disease of native coronary artery without angina pectoris: Secondary | ICD-10-CM | POA: Insufficient documentation

## 2017-09-30 DIAGNOSIS — Z7982 Long term (current) use of aspirin: Secondary | ICD-10-CM | POA: Insufficient documentation

## 2017-09-30 DIAGNOSIS — M6281 Muscle weakness (generalized): Secondary | ICD-10-CM

## 2017-09-30 DIAGNOSIS — M5416 Radiculopathy, lumbar region: Secondary | ICD-10-CM

## 2017-09-30 DIAGNOSIS — R079 Chest pain, unspecified: Secondary | ICD-10-CM | POA: Diagnosis present

## 2017-09-30 DIAGNOSIS — R262 Difficulty in walking, not elsewhere classified: Secondary | ICD-10-CM | POA: Diagnosis not present

## 2017-09-30 LAB — BASIC METABOLIC PANEL
Anion gap: 10 (ref 5–15)
BUN: 14 mg/dL (ref 6–20)
CALCIUM: 9.5 mg/dL (ref 8.9–10.3)
CHLORIDE: 102 mmol/L (ref 101–111)
CO2: 28 mmol/L (ref 22–32)
CREATININE: 0.89 mg/dL (ref 0.61–1.24)
Glucose, Bld: 102 mg/dL — ABNORMAL HIGH (ref 65–99)
Potassium: 4.2 mmol/L (ref 3.5–5.1)
SODIUM: 140 mmol/L (ref 135–145)

## 2017-09-30 LAB — CBC WITH DIFFERENTIAL/PLATELET
Basophils Absolute: 0 10*3/uL (ref 0.0–0.1)
Basophils Relative: 0 %
EOS ABS: 0.2 10*3/uL (ref 0.0–0.7)
EOS PCT: 2 %
HCT: 42.2 % (ref 39.0–52.0)
Hemoglobin: 14.4 g/dL (ref 13.0–17.0)
LYMPHS ABS: 2 10*3/uL (ref 0.7–4.0)
Lymphocytes Relative: 25 %
MCH: 30.9 pg (ref 26.0–34.0)
MCHC: 34.1 g/dL (ref 30.0–36.0)
MCV: 90.6 fL (ref 78.0–100.0)
MONOS PCT: 8 %
Monocytes Absolute: 0.6 10*3/uL (ref 0.1–1.0)
NEUTROS PCT: 65 %
Neutro Abs: 5.2 10*3/uL (ref 1.7–7.7)
PLATELETS: 182 10*3/uL (ref 150–400)
RBC: 4.66 MIL/uL (ref 4.22–5.81)
RDW: 13.4 % (ref 11.5–15.5)
WBC: 8 10*3/uL (ref 4.0–10.5)

## 2017-09-30 LAB — I-STAT TROPONIN, ED
TROPONIN I, POC: 0 ng/mL (ref 0.00–0.08)
Troponin i, poc: 0 ng/mL (ref 0.00–0.08)

## 2017-09-30 NOTE — Discharge Instructions (Signed)
Follow up with Dr. Harrington Challenger with any ongoing symptoms. Return to the ER with any worsening symptoms.

## 2017-09-30 NOTE — ED Notes (Signed)
Pt states he took 2 baby asprin at 10:30am and 1 full strength asprin at 1:30pm

## 2017-09-30 NOTE — Therapy (Signed)
[place], [place], [address] Phone: [phone]   Fax:  [phone]  Physical Therapy Treatment  Patient Details  Name: [name] [name] MRN: [id number] Date of Birth: Mar 15, 1951 Referring Provider: Dr. Mindi Curling   Encounter Date: 09/30/2017      PT End of Session - 09/30/17 0845    Visit Number 11   Number of Visits 12   Date for PT Re-Evaluation 09/22/17   Authorization Type medicare/ BCBS   Authorization - Visit Number 11   Authorization - Number of Visits 10   PT Start Time 0815   PT Stop Time 0900   PT Time Calculation (min) 45 min   Equipment Utilized During Treatment Other (comment)   Activity Tolerance Patient tolerated treatment well;No increased pain   Behavior During Therapy WFL for tasks assessed/performed      Past Medical History:  Diagnosis Date  . Anxiety   . Arthritis    back pain  . Avascular necrosis of bone of hip ([place])    right  . CAD (coronary artery disease)    stents x3 2008  . GERD (gastroesophageal reflux disease)   . History of nuclear stress test    a. Myoview 10/17: EF 56%, diaphragmatic attenuation, no ischemia, low risk  . HTN (hypertension)   . Hyperlipidemia   . Myocardial infarction   . Paresthesias     Past Surgical History:  Procedure Laterality Date  . CARDIAC CATHETERIZATION     3 stents placed  . COLONOSCOPY  2011 NUR [facility] SCREENING d50 v5   MILD [place] TICS, ? PREP ARTIFACT V. PROCTITIS-rX:[place]. next TCS 2021.  . ESOPHAGEAL DILATION N/A 03/02/2016   Procedure: ESOPHAGEAL DILATION;  Surgeon: Danie Binder, MD;  [facility];  Service: Endoscopy;  Laterality: N/A;  . ESOPHAGOGASTRODUODENOSCOPY  10/03/11   small hiatal hernia/gastric ulcers/stricutre in distal esophagus  . ESOPHAGOGASTRODUODENOSCOPY N/A 03/02/2016   Dr. Oneida Alar: Schatzki's ring at [place] junction s/p dilation, mild non-erosive gastritis/duodenitis   . MASS EXCISION Left 09/30/2014    Procedure: EXCISION MASS LEFT WRIST ;  Surgeon: Daryll Brod, MD;  Location: [facility];  Service: Orthopedics;  Laterality: Left;  . TONSILLECTOMY  age 41  . TOTAL HIP ARTHROPLASTY  age 39   right hip for avascular necrosis of hip and spine    There were no vitals filed for this visit.      Subjective Assessment - 09/30/17 0816    Subjective Pt states that he went to the pain management clinic and they gave him a TENS unit.  Pt request therapist explain TENS    Pertinent History MRI:  bulging L3-4 and L4-L5 ; forminal narrowing at L5-S1;  Grade 2 anteriorlisthesis of L5S1    How long can you sit comfortably? Pt is able to sit for a chair for an hour on the couch for 30 minutes.    How long can you stand comfortably? Able to stand for 10 minutes   How long can you walk comfortably? Pt walks the dogs everyday for 45 minutes    Patient Stated Goals keep the pain away   Currently in Pain? No/denies   Pain Onset 1 to 4 weeks ago                         [facility] Adult PT Treatment/Exercise - 09/30/17 0001      Posture/Postural Control   Posture/Postural Control Postural  limitations   Postural Limitations Rounded Shoulders;Forward head;Decreased lumbar lordosis;Increased thoracic kyphosis     Lumbar Exercises: Stretches   Active Hamstring Stretch 3 reps;30 seconds   Active Hamstring Stretch Limitations supine   Prone on Elbows Stretch Limitations 2 minutes   Press Ups 5 reps   Quad Stretch 3 reps;30 seconds   Piriformis Stretch Limitations --     Lumbar Exercises: Seated   Sit to Stand 20 reps     Lumbar Exercises: Prone   Straight Leg Raise 10 reps   Straight Leg Raises Limitations with stabilization     Modalities   Modalities Traction     Traction   Type of Traction Lumbar   Min (lbs) 0   Max (lbs) 85   Hold Time Static   Time 10 minutes                  PT Short Term Goals - 09/24/17 0844      PT SHORT TERM GOAL #1   Title Pt  to demonstrate and be able to verbalize the importance of good sitting posture.    Time 3   Period Weeks   Status Achieved     PT SHORT TERM GOAL #2   Title Pt to have no greater than a 5/10 pain in his back and Rt leg to allow pt to sit for an hour to enjoy eating a meal    Time 3   Period Weeks   Status Achieved     PT SHORT TERM GOAL #3   Title PT strength in his right leg to be increased by 1/2 grade to allow him to come from sit to stand from his couch with increase ease.    Time 3   Status Achieved     PT SHORT TERM GOAL #4   Title Pt to be able to walk/stand for 30 minutes without experiencing increased back or right leg pain to be able to complete short shopping trips.    Time 3   Period Weeks   Status Achieved           PT Long Term Goals - 09/24/17 0845      PT LONG TERM GOAL #1   Title Pt pain in his back and right leg to be no greater than a 3/10 to allow pt to bring groceries into the house from his car in comfort.    Baseline 9/25: pain has been at a 4/10   Time 6   Period Weeks   Status Partially Met     PT LONG TERM GOAL #2   Title Pt to be able to complete light yard work for an hour without experiencing increased back or right leg pain    Time 6   Period Weeks   Status Achieved     PT LONG TERM GOAL #3   Title PT to be able to sit for two hours for traveling without having increased back or right leg pain    Baseline 9/25: depends on what he is sitting on    Time 6   Period Weeks   Status Partially Met     PT LONG TERM GOAL #4   Title Pt to be able to sleep without having to take medication.    Time 6   Period Weeks   Status Achieved               Plan - 09/30/17 0846    Clinical Impression Statement Pt  comes with a TENS unit.  Therapist explained how TENS unit works and theory behind [employer].  Pt feels that he will be ready for discharge next visit.    Rehab Potential Good   PT Frequency 2x / week   PT Duration 6 weeks   PT  Treatment/Interventions Functional mobility training;Therapeutic activities;Therapeutic exercise;Balance training;Patient/family education;Manual techniques;Cryotherapy;Moist Heat;Iontophoresis 68m/ml Dexamethasone;Ultrasound;Traction   PT Next Visit Plan Anticipate discharge from treatment next visit.  Complete Foto    PT Home Exercise Plan sitting tall, scapular retraction, pelvic tilt, knee to chest and active hamstring stretch; 8/28: bridge, bent knee raise and decompression 1-5; 09/06/17: decompression wiht theraband   Consulted and Agree with Plan of Care Patient      Patient will benefit from skilled therapeutic intervention in order to improve the following deficits and impairments:  Decreased activity tolerance, Decreased balance, Decreased range of motion, Decreased strength, Difficulty walking, Increased fascial restricitons, Postural dysfunction, Improper body mechanics, Pain  Visit Diagnosis: Right lumbar radiculopathy  Difficulty in walking, not elsewhere classified  Muscle weakness (generalized)     Problem List Patient Active Problem List   Diagnosis Date Noted  . Esophageal dysphagia 02/08/2016  . Hemorrhoids 02/08/2016  . Chronic radicular cervical pain 07/29/2014  . GERD (gastroesophageal reflux disease) 10/14/2012  . Constipation 10/14/2012  . Stiffness of joint, not elsewhere classified, other specified site 02/21/2012  . PUD (peptic ulcer disease) 12/21/2011  . Dysphagia 07/25/2011  . Hyperlipidemia 08/12/2009  . ESSENTIAL HYPERTENSION, BENIGN 08/12/2009  . CAD, NATIVE VESSEL 08/12/2009  . CUTANEOUS ERUPTIONS, DRUG-INDUCED 01/09/2008  . LOCALIZED SUPERFICIAL SWELLING MASS OR LUMP 01/09/2008    CRayetta Humphrey PT CLT [phone]/12/2016, 9:05  AM  [place] [address]Phone: [phone]  Fax:  [phone] Name: [name] POORMANMRN: [id number] of Birth:  [birth year]

## 2017-09-30 NOTE — ED Triage Notes (Signed)
Pt reports chest pain starting around 0930 after he left physical therapy.  States it feels hard to catch his breath, but denies shortness of breath.

## 2017-09-30 NOTE — ED Provider Notes (Signed)
[facility] DEPT Provider Note   CSN: [id number] Arrival date & time: 09/30/17  1423     History   Chief Complaint Chief Complaint  Patient presents with  . Chest Pain    HPI [name] [name] is a 66 y.o. male.  CC:  "A Strange feeling in my chest".  HPI : [name] [name] presents for evaluation after feeling something strange in his chest after completing some physical therapy today for his back pain.  Describes it as "like there was a vacuum in my chest". He states it felt like he had either a skipped beat or an extra beat with his heart. He denies actual pain.  He states it was very subtle and very brief. He states that he did in 07 have an MI and required 3 stents. He follows with Dr. Harrington Challenger. He has been a symptomatically. He walks one to 2 miles per day without dyspnea. He has had a few episodes such as this over the last few years. He still discussed them with his cardiologist. He states that she "wasn't concerned".  Past Medical History:  Diagnosis Date  . Anxiety   . Arthritis    back pain  . Avascular necrosis of bone of hip ([place])    right  . CAD (coronary artery disease)    stents x3 2008  . GERD (gastroesophageal reflux disease)   . History of nuclear stress test    a. Myoview 10/17: EF 56%, diaphragmatic attenuation, no ischemia, low risk  . HTN (hypertension)   . Hyperlipidemia   . Myocardial infarction ([place])   . Paresthesias     Patient Active Problem List   Diagnosis Date Noted  . Esophageal dysphagia 02/08/2016  . Hemorrhoids 02/08/2016  . Chronic radicular cervical pain 07/29/2014  . GERD (gastroesophageal reflux disease) 10/14/2012  . Constipation 10/14/2012  . Stiffness of joint, not elsewhere classified, other specified site 02/21/2012  . PUD (peptic ulcer disease) 12/21/2011  . Dysphagia 07/25/2011  . Hyperlipidemia 08/12/2009  . ESSENTIAL HYPERTENSION, BENIGN 08/12/2009  . CAD, NATIVE VESSEL 08/12/2009  . CUTANEOUS ERUPTIONS, DRUG-INDUCED  01/09/2008  . LOCALIZED SUPERFICIAL SWELLING MASS OR LUMP 01/09/2008    Past Surgical History:  Procedure Laterality Date  . CARDIAC CATHETERIZATION     3 stents placed  . COLONOSCOPY  2011 NUR [facility] SCREENING d50 v5   MILD [place] TICS, ? PREP ARTIFACT V. PROCTITIS-rX:[place]. next TCS 2021.  . ESOPHAGEAL DILATION N/A 03/02/2016   Procedure: ESOPHAGEAL DILATION;  Surgeon: Danie Binder, MD;  [facility];  Service: Endoscopy;  Laterality: N/A;  . ESOPHAGOGASTRODUODENOSCOPY  10/03/11   small hiatal hernia/gastric ulcers/stricutre in distal esophagus  . ESOPHAGOGASTRODUODENOSCOPY N/A 03/02/2016   Dr. Oneida Alar: Schatzki's ring at [place] junction s/p dilation, mild non-erosive gastritis/duodenitis   . MASS EXCISION Left 09/30/2014   Procedure: EXCISION MASS LEFT WRIST ;  Surgeon: Daryll Brod, MD;  Location: [facility];  Service: Orthopedics;  Laterality: Left;  . TONSILLECTOMY  age 8  . TOTAL HIP ARTHROPLASTY  age 39   right hip for avascular necrosis of hip and spine       Home Medications    Prior to Admission medications   Medication Sig Start Date End Date Taking? Authorizing Provider  aspirin EC 81 MG tablet Take 81 mg by mouth daily.    [provider]  calcium carbonate (TUMS EX) 750 MG chewable tablet Chew 1 tablet by mouth 3 (three) times daily as needed for heartburn.  [provider]  Coenzyme Q10 200 MG capsule Take 200 mg by mouth daily.    [provider]  Evolocumab (REPATHA SURECLICK) 378 MG/ML SOAJ Inject 1 pen into the skin every 14 (fourteen) days. 02/07/17   Fay Records, MD  famotidine (PEPCID) 20 MG tablet Take 1 tablet (20 mg total) by mouth 2 (two) times daily. 06/04/16   Annitta Needs, NP  levothyroxine (SYNTHROID, LEVOTHROID) 75 MCG tablet Take 75 mcg by mouth daily before breakfast.    [provider]  losartan-hydrochlorothiazide (HYZAAR) 100-25 MG tablet Take 0.5 tablets by mouth 2 (two) times daily. 01/30/16    Fay Records, MD  nitroGLYCERIN (NITROSTAT) 0.4 MG SL tablet Place 0.4 mg under the tongue every 5 (five) minutes as needed for chest pain. X 3 doses    [provider]  Saw Palmetto, Serenoa repens, (SAW PALMETTO PO) Take 1 tablet by mouth daily. PATIENT NOT SURE OF THE DOSE    [provider]    Family History Family History  Problem Relation Age of Onset  . Dementia Mother   . Prostate cancer Father 77  . Lung cancer Sister 69  . Colon cancer Neg Hx   . Colon polyps Neg Hx   . Anesthesia problems Neg Hx   . Hypotension Neg Hx   . Malignant hyperthermia Neg Hx   . Pseudochol deficiency Neg Hx     Social History Social History  Substance Use Topics  . Smoking status: Former Smoker    Packs/day: 1.00    Years: 30.00    Types: Cigarettes    Quit date: 09/30/2007  . Smokeless tobacco: Never Used  . Alcohol use No     Allergies   Other; Bee venom; Clopidogrel bisulfate; Crestor [rosuvastatin]; Lipitor [atorvastatin]; Penicillin g; Penicillins; Pravastatin; Zetia [ezetimibe]; and Clopidogrel   Review of Systems Review of Systems  Constitutional: Negative for appetite change, chills, diaphoresis, fatigue and fever.  HENT: Negative for mouth sores, sore throat and trouble swallowing.   Eyes: Negative for visual disturbance.  Respiratory: Positive for shortness of breath. Negative for cough, chest tightness and wheezing.   Cardiovascular: Positive for chest pain.  Gastrointestinal: Negative for abdominal distention, abdominal pain, diarrhea, nausea and vomiting.  Endocrine: Negative for polydipsia, polyphagia and polyuria.  Genitourinary: Negative for dysuria, frequency and hematuria.  Musculoskeletal: Negative for gait problem.  Skin: Negative for color change, pallor and rash.  Neurological: Negative for dizziness, syncope, light-headedness and headaches.  Hematological: Does not bruise/bleed easily.  Psychiatric/Behavioral: Negative for behavioral  problems and confusion.     Physical Exam Updated Vital Signs BP 118/89   Pulse 72   Temp 98 F (36.7 C) (Oral)   Resp 18   Ht 5\' 7"  (1.702 m)   Wt 82.6 kg (182 lb)   SpO2 98%   BMI 28.51 kg/m   Physical Exam  Constitutional: He is oriented to person, place, and time. He appears well-developed and well-nourished. No distress.  HENT:  Head: Normocephalic.  Eyes: Pupils are equal, round, and reactive to light. Conjunctivae are normal. No scleral icterus.  Neck: Normal range of motion. Neck supple. No thyromegaly present.  Cardiovascular: Normal rate and regular rhythm.  Exam reveals no gallop and no friction rub.   No murmur heard. Pulmonary/Chest: Effort normal and breath sounds normal. No respiratory distress. He has no wheezes. He has no rales.  Abdominal: Soft. Bowel sounds are normal. He exhibits no distension. There is no tenderness. There is no  rebound.  Musculoskeletal: Normal range of motion.  Neurological: He is alert and oriented to person, place, and time.  Skin: Skin is warm and dry. No rash noted.  Psychiatric: He has a normal mood and affect. His behavior is normal.     ED Treatments / Results  Labs (all labs ordered are listed, but only abnormal results are displayed) Labs Reviewed  BASIC METABOLIC PANEL - Abnormal; Notable for the following:       Result Value   Glucose, Bld 102 (*)    All other components within normal limits  CBC WITH DIFFERENTIAL/PLATELET  I-STAT TROPONIN, ED  I-STAT TROPONIN, ED    EKG  EKG Interpretation  Date/Time:  Monday September 30 2017 14:41:53 EDT Ventricular Rate:  96 PR Interval:  182 QRS Duration: 90 QT Interval:  374 QTC Calculation: 472 R Axis:   22 Text Interpretation:  Sinus rhythm with marked sinus arrhythmia Otherwise normal ECG Confirmed by Tanna Furry [phone]) on 09/30/2017 2:46:28 PM       Radiology No results found.  Procedures Procedures (including critical care time)  Medications Ordered in  ED Medications - No data to display   Initial Impression / Assessment and Plan / ED Course  I have reviewed the triage vital signs and the nursing notes.  Pertinent labs & imaging results that were available during my care of the patient were reviewed by me and considered in my medical decision making (see chart for details).    EKG shows no acute changes. Initial, and follow troponin I are both normal. I'm not concerned from his history that this was a cardiac event. He has a marked sinus arrhythmia days may be sinus pauses or PVCs that he is symptomatically from. Nonetheless stable for discharge at this time.   Final Clinical Impressions(s) / ED Diagnoses   Final diagnoses:  Dyspnea, unspecified type    New Prescriptions New Prescriptions   No medications on file     Tanna Furry, MD 09/30/17 [phone]

## 2017-10-03 ENCOUNTER — Ambulatory Visit (HOSPITAL_COMMUNITY): Payer: Medicare Other | Admitting: Physical Therapy

## 2017-10-03 DIAGNOSIS — R262 Difficulty in walking, not elsewhere classified: Secondary | ICD-10-CM

## 2017-10-03 DIAGNOSIS — M5416 Radiculopathy, lumbar region: Secondary | ICD-10-CM | POA: Diagnosis not present

## 2017-10-03 DIAGNOSIS — M6281 Muscle weakness (generalized): Secondary | ICD-10-CM | POA: Diagnosis not present

## 2017-10-03 NOTE — Patient Instructions (Signed)
Scapular Retraction (Prone)    Lie with arms at sides. Pinch shoulder blades together and raise arms a few inches from floor. Repeat ___10_ times per set. Do ___1_ sets per session. Do __1__ sessions per day. 1 http://orth.exer.us/954   Copyright  VHI. All rights reserved.  Scapular Retraction (Prone)    Lie with arms above head. Pinch shoulder blades together. Repeat _10___ times per set. Do _1___ sets per session. Do ___1_ sessions per day.  http://orth.exer.us/942   Copyright  VHI. All rights reserved.  Scapular Retraction: Abduction (Prone)    Lie with upper arms straight out from sides, elbows bent to 90. Pinch shoulder blades together and raise arms a few inches from floor. Repeat _10___ times per set. Do __1__ sets per session. Do ___1 sessions per day.  http://orth.exer.us/956   Copyright  VHI. All rights reserved.  Scapular Retraction: Abduction / Extension (Prone)    Lie with arms out from sides 90. Pinch shoulder blades together and raise arms a few inches from floor. Repeat ___10_ times per set. Do __1__ sets per session. Do _2___ sessions per day.  http://orth.exer.us/958   Copyright  VHI. All rights reserved.

## 2017-10-03 NOTE — Therapy (Signed)
[facility] [address] [place], [place], [address] Phone: [phone]   Fax:  [phone]  Physical Therapy Treatment  Patient Details  Name: [name] [name] MRN: [id number] Date of Birth: 1951-07-28 Referring Provider: Mindi Curling   Encounter Date: 10/03/2017      PT End of Session - 10/03/17 0846    Visit Number 12   Number of Visits 12   Date for PT Re-Evaluation 09/22/17   Authorization Type medicare/ BCBS   Authorization - Visit Number 12   Authorization - Number of Visits 12   PT Start Time 0820   PT Stop Time 0910   PT Time Calculation (min) 50 min   Equipment Utilized During Treatment Other (comment)   Activity Tolerance Patient tolerated treatment well;No increased pain   Behavior During Therapy WFL for tasks assessed/performed      Past Medical History:  Diagnosis Date  . Anxiety   . Arthritis    back pain  . Avascular necrosis of bone of hip ([place])    right  . CAD (coronary artery disease)    stents x3 2008  . GERD (gastroesophageal reflux disease)   . History of nuclear stress test    a. Myoview 10/17: EF 56%, diaphragmatic attenuation, no ischemia, low risk  . HTN (hypertension)   . Hyperlipidemia   . Myocardial infarction ([place])   . Paresthesias     Past Surgical History:  Procedure Laterality Date  . CARDIAC CATHETERIZATION     3 stents placed  . COLONOSCOPY  2011 NUR [facility] SCREENING d50 v5   MILD [place] TICS, ? PREP ARTIFACT V. PROCTITIS-rX:[place]. next TCS 2021.  . ESOPHAGEAL DILATION N/A 03/02/2016   Procedure: ESOPHAGEAL DILATION;  Surgeon: Danie Binder, MD;  [facility];  Service: Endoscopy;  Laterality: N/A;  . ESOPHAGOGASTRODUODENOSCOPY  10/03/11   small hiatal hernia/gastric ulcers/stricutre in distal esophagus  . ESOPHAGOGASTRODUODENOSCOPY N/A 03/02/2016   Dr. Oneida Alar: Schatzki's ring at [place] junction s/p dilation, mild non-erosive gastritis/duodenitis   . MASS EXCISION Left 09/30/2014    Procedure: EXCISION MASS LEFT WRIST ;  Surgeon: Daryll Brod, MD;  Location: [facility];  Service: Orthopedics;  Laterality: Left;  . TONSILLECTOMY  age 32  . TOTAL HIP ARTHROPLASTY  age 50   right hip for avascular necrosis of hip and spine    There were no vitals filed for this visit.      Subjective Assessment - 10/03/17 0824    Subjective Pt states that he has been trying to lie on his stomach.  His back is back to it's normal state of pain. Pt states that he has not had to use pain medication or the TENS unit.   Pertinent History MRI:  bulging L3-4 and L4-L5 ; forminal narrowing at L5-S1;  Grade 2 anteriorlisthesis of L5S1    How long can you sit comfortably? Pt is able to sit for a chair for an hour on the couch for 30 minutes.    How long can you stand comfortably? Able to stand for 10 minutes this has not changed    How long can you walk comfortably? Pt walks the dogs everyday for 45 minutes; walking relieves his pain.    Patient Stated Goals keep the pain away   Currently in Pain? Yes   Pain Score 2    Pain Location Back   Pain Orientation Lower   Pain Descriptors / Indicators Aching   Pain Onset 1 to  4 weeks ago            [facility] PT Assessment - 10/03/17 0001      Assessment   Medical Diagnosis Low back pain; spondololisthesis   Referring Provider Mindi Curling    Onset Date/Surgical Date 07/31/17   Next MD Visit 09/19/2017   Prior Therapy years ago      Precautions   Precautions None     Restrictions   Weight Bearing Restrictions No     Prior Function   Level of Independence Independent   Vocation Retired     Associate Professor   Overall Cognitive Status Within Functional Limits for tasks assessed     Observation/Other Assessments   Focus on Therapeutic Outcomes (FOTO)  60  was 53     Functional Tests   Functional tests Single leg stance;Sit to Stand     Single Leg Stance   Comments Rt  33 was 10 seconds LT 33 was 16     Sit to Stand    Comments 5x  13.7 was 18.88     Posture/Postural Control   Posture/Postural Control Postural limitations   Postural Limitations Rounded Shoulders;Forward head;Decreased lumbar lordosis;Increased thoracic kyphosis     AROM   Lumbar Flexion normal with reps causing improved pain.    Lumbar Extension decreased 50% with reps causing increased pain      Strength   Right Hip Extension 4-/5  was 3/5    Right Hip ABduction 5/5  was 3+/5    Left Hip Extension 4-/5  was3/5   Left Hip ABduction 5/5  was 3+/5    Right Knee Flexion 5/5  3+/5    Right Knee Extension 5/5   Left Knee Flexion 5/5   Left Knee Extension 5/5  was 3+5    Right Ankle Dorsiflexion 5/5   Left Ankle Dorsiflexion 5/5     Flexibility   Soft Tissue Assessment /Muscle Length yes   Hamstrings Rt: 145; Lt 145 no change                      [facility] Adult PT Treatment/Exercise - 10/03/17 0001      Lumbar Exercises: Stretches   Active Hamstring Stretch 3 reps;30 seconds   Active Hamstring Stretch Limitations supine   Standing Extension 5 reps   Prone on Elbows Stretch 5 reps   Prone on Elbows Stretch Limitations 20 seconds    Quad Stretch 3 reps;30 seconds     Lumbar Exercises: Prone   Straight Leg Raise 10 reps   Other Prone Lumbar Exercises I, Y and T x 10      Modalities   Modalities Traction     Traction   Type of Traction Lumbar   Min (lbs) 0   Max (lbs) 85   Hold Time Static   Time 10                PT Education - 10/03/17 0846    Education provided Yes   Education Details emphasized the importance of proper posture and body mechanics.    Person(s) Educated Patient   Methods Explanation   Comprehension Verbalized understanding          PT Short Term Goals - 09/24/17 0844      PT SHORT TERM GOAL #1   Title Pt to demonstrate and be able to verbalize the importance of good sitting posture.    Time 3   Period Weeks   Status Achieved  PT SHORT TERM GOAL #2   Title  Pt to have no greater than a 5/10 pain in his back and Rt leg to allow pt to sit for an hour to enjoy eating a meal    Time 3   Period Weeks   Status Achieved     PT SHORT TERM GOAL #3   Title PT strength in his right leg to be increased by 1/2 grade to allow him to come from sit to stand from his couch with increase ease.    Time 3   Status Achieved     PT SHORT TERM GOAL #4   Title Pt to be able to walk/stand for 30 minutes without experiencing increased back or right leg pain to be able to complete short shopping trips.    Time 3   Period Weeks   Status Achieved           PT Long Term Goals - 09/24/17 0845      PT LONG TERM GOAL #1   Title Pt pain in his back and right leg to be no greater than a 3/10 to allow pt to bring groceries into the house from his car in comfort.    Baseline 9/25: pain has been at a 4/10   Time 6   Period Weeks   Status Partially Met     PT LONG TERM GOAL #2   Title Pt to be able to complete light yard work for an hour without experiencing increased back or right leg pain    Time 6   Period Weeks   Status Achieved     PT LONG TERM GOAL #3   Title PT to be able to sit for two hours for traveling without having increased back or right leg pain    Baseline 9/25: depends on what he is sitting on    Time 6   Period Weeks   Status Partially Met     PT LONG TERM GOAL #4   Title Pt to be able to sleep without having to take medication.    Time 6   Period Weeks   Status Achieved               Plan - Oct 21, 2017 0847    Clinical Impression Statement Pt back to baseline and feels confident about discharge.  Pt give scapular stabilization exercises to improve posture.    Rehab Potential Good   PT Frequency 2x / week   PT Duration 6 weeks   PT Treatment/Interventions Functional mobility training;Therapeutic activities;Therapeutic exercise;Balance training;Patient/family education;[name] techniques;Cryotherapy;Moist Heat;Iontophoresis 33m/ml  Dexamethasone;Ultrasound;Traction   PT Next Visit Plan Discharge from therapy.    PT Home Exercise Plan sitting tall, scapular retraction, pelvic tilt, knee to chest and active hamstring stretch; 8/28: bridge, bent knee raise and decompression 1-5; 09/06/17: decompression wiht theraband   Consulted and Agree with Plan of Care Patient      Patient will benefit from skilled therapeutic intervention in order to improve the following deficits and impairments:  Decreased activity tolerance, Decreased balance, Decreased strength, Difficulty walking, Increased fascial restricitons, Postural dysfunction, Improper body mechanics, Pain, Decreased range of motion  Visit Diagnosis: Right lumbar radiculopathy  Difficulty in walking, not elsewhere classified  Muscle weakness (generalized)       G-Codes - 110/22/180859    Functional Assessment Tool Used (Outpatient Only) foto   Functional Limitation Other PT primary   Other PT Primary Goal Status ((T4196 At least 20 percent but less than  40 percent impaired, limited or restricted   Other PT Primary Discharge Status [phone]) At least 20 percent but less than 40 percent impaired, limited or restricted      Problem List Patient Active Problem List   Diagnosis Date Noted  . Esophageal dysphagia 02/08/2016  . Hemorrhoids 02/08/2016  . Chronic radicular cervical pain 07/29/2014  . GERD (gastroesophageal reflux disease) 10/14/2012  . Constipation 10/14/2012  . Stiffness of joint, not elsewhere classified, other specified site 02/21/2012  . PUD (peptic ulcer disease) 12/21/2011  . Dysphagia 07/25/2011  . Hyperlipidemia 08/12/2009  . ESSENTIAL HYPERTENSION, BENIGN 08/12/2009  . CAD, NATIVE VESSEL 08/12/2009  . CUTANEOUS ERUPTIONS, DRUG-INDUCED 01/09/2008  . LOCALIZED SUPERFICIAL SWELLING MASS OR LUMP 01/09/2008    Rayetta Humphrey, PT CLT [phone] 10/03/2017, 9:01 AM  [facility] [place], [place], [address] Phone: [phone]   Fax:  [phone]  Name: [name] [name] MRN: [id number] Date of Birth: 09/18/1951  PHYSICAL THERAPY DISCHARGE SUMMARY  Visits from Start of Care: 12  Current functional level related to goals / functional outcomes: See above   Remaining deficits: See above    Education / Equipment: HEP; posture. Plan: Patient agrees to discharge.  Patient goals were partially met. Patient is being discharged due to being pleased with the current functional level.  ?????        Rayetta Humphrey, [place] CLT [phone]

## 2017-11-13 ENCOUNTER — Other Ambulatory Visit: Payer: Self-pay | Admitting: Pharmacist

## 2017-11-13 MED ORDER — EVOLOCUMAB 140 MG/ML ~~LOC~~ SOAJ: 1.0000 "pen " | SUBCUTANEOUS | 0 refills | Status: DC

## 2018-01-24 ENCOUNTER — Ambulatory Visit (INDEPENDENT_AMBULATORY_CARE_PROVIDER_SITE_OTHER): Payer: Medicare Other | Admitting: Internal Medicine

## 2018-01-24 ENCOUNTER — Encounter: Payer: Self-pay | Admitting: Internal Medicine

## 2018-01-24 VITALS — BP 124/70 | HR 61 | Ht 67.0 in | Wt 181.1 lb

## 2018-01-24 DIAGNOSIS — I358 Other nonrheumatic aortic valve disorders: Secondary | ICD-10-CM | POA: Diagnosis not present

## 2018-01-24 DIAGNOSIS — E785 Hyperlipidemia, unspecified: Secondary | ICD-10-CM | POA: Diagnosis not present

## 2018-01-24 DIAGNOSIS — R7309 Other abnormal glucose: Secondary | ICD-10-CM | POA: Diagnosis not present

## 2018-01-24 DIAGNOSIS — R0989 Other specified symptoms and signs involving the circulatory and respiratory systems: Secondary | ICD-10-CM | POA: Diagnosis not present

## 2018-01-24 DIAGNOSIS — I10 Essential (primary) hypertension: Secondary | ICD-10-CM

## 2018-01-24 DIAGNOSIS — I2510 Atherosclerotic heart disease of native coronary artery without angina pectoris: Secondary | ICD-10-CM

## 2018-01-24 MED ORDER — ASPIRIN EC 81 MG PO TBEC: 81.0000 mg | DELAYED_RELEASE_TABLET | Freq: Every day | ORAL | 3 refills | Status: AC

## 2018-01-24 NOTE — Patient Instructions (Addendum)
Medication Instructions:  Your physician has recommended you make the following change in your medication:   DECREASE: Aspirin to 81 mg daily  Labwork: TODAY: FASTING LIPIDS, CBC, BMET  Testing/Procedures: Your physician has requested that you have a carotid duplex. This test is an ultrasound of the carotid arteries in your neck. It looks at blood flow through these arteries that supply the brain with blood. Allow one hour for this exam. There are no restrictions or special instructions.  Your physician has requested that you have an abdominal aorta duplex. During this test, an ultrasound is used to evaluate the aorta. Allow 30 minutes for this exam. Do not eat after midnight the day before and avoid carbonated beverages  Follow-Up: Your physician wants you to follow-up in: 1 year with Dr. Harrington Challenger. You will receive a reminder letter in the mail two months in advance. If you don't receive a letter, please call our office to schedule the follow-up appointment.   Any Other Special Instructions Will Be Listed Below (If Applicable).     If you need a refill on your cardiac medications before your next appointment, please call your pharmacy.

## 2018-01-24 NOTE — Progress Notes (Signed)
Cardiology Office Note   Date:  01/24/2018   ID:  [name] [name] 1951/12/22, MRN [id number]  PCP:  [name] Sites, MD  Cardiologist:   Dorris Carnes, MD    F/U of CAD     History of Present Illness: [name] [name] is a 67 y.o. male with a history ofCAD s/p NSTEMI in 2008 tx with PCI of bifurcational lesion in LAD/Dx with 3 DES, HL, HTN.  He has a hx of rash with Plavix and Ticlid.  He underwent Plavix desensitization in 2009 Pt had CP in 2017   Myovue showed no ischemia    I saw the pt back in Jan 2018  Since then he was seen by Sundra Aland to set up for Repatha   Last lipids in April 2018 LDL was 61  HDL was 38.    Since seen the pt says he had a spell a couple weeks ago  Sitting at table  Got blurry vision in eyes   Worse in L eye  Like" pin wheel" in eye  Went away in 2 or 3 minutes  Has not had any othre spells  No other neurologic complaints      Current Meds  Medication Sig  . aspirin EC 81 MG tablet Take 81 mg by mouth 2 (two) times daily.  . calcium carbonate (TUMS EX) 750 MG chewable tablet Chew 1 tablet by mouth 3 (three) times daily as needed for heartburn.   . Coenzyme Q10 200 MG capsule Take 200 mg by mouth daily.  . Evolocumab (REPATHA SURECLICK) 818 MG/ML SOAJ Inject 1 pen every 14 (fourteen) days into the skin.  . famotidine (PEPCID) 20 MG tablet Take 1 tablet (20 mg total) by mouth 2 (two) times daily.  [place] Kitchen levothyroxine (SYNTHROID, LEVOTHROID) 75 MCG tablet Take 75 mcg by mouth daily before breakfast.  . losartan-hydrochlorothiazide (HYZAAR) 100-25 MG tablet Take 0.5 tablets by mouth 2 (two) times daily.  . nitroGLYCERIN (NITROSTAT) 0.4 MG SL tablet Place 0.4 mg under the tongue every 5 (five) minutes as needed for chest pain. X 3 doses  . Saw Palmetto, Serenoa repens, (SAW PALMETTO PO) Take 1 tablet by mouth daily. PATIENT NOT SURE OF THE DOSE     Allergies:   Other; Atorvastatin; Bee venom; Clopidogrel bisulfate; Penicillin g; Penicillins; Pravastatin;  Rosuvastatin; Zetia [ezetimibe]; and Clopidogrel   Past Medical History:  Diagnosis Date  . Anxiety   . Arthritis    back pain  . Avascular necrosis of bone of hip ([place])    right  . CAD (coronary artery disease)    stents x3 2008  . GERD (gastroesophageal reflux disease)   . History of nuclear stress test    a. Myoview 10/17: EF 56%, diaphragmatic attenuation, no ischemia, low risk  . HTN (hypertension)   . Hyperlipidemia   . Myocardial infarction ([place])   . Paresthesias     Past Surgical History:  Procedure Laterality Date  . CARDIAC CATHETERIZATION     3 stents placed  . COLONOSCOPY  2011 NUR [facility] SCREENING d50 v5   MILD [place] TICS, ? PREP ARTIFACT V. PROCTITIS-rX:[place]. next TCS 2021.  . ESOPHAGEAL DILATION N/A 03/02/2016   Procedure: ESOPHAGEAL DILATION;  Surgeon: Danie Binder, MD;  [facility];  Service: Endoscopy;  Laterality: N/A;  . ESOPHAGOGASTRODUODENOSCOPY  10/03/11   small hiatal hernia/gastric ulcers/stricutre in distal esophagus  . ESOPHAGOGASTRODUODENOSCOPY N/A 03/02/2016   Dr. Oneida Alar: Schatzki's ring at [place] junction s/p dilation, mild non-erosive  gastritis/duodenitis   . MASS EXCISION Left 09/30/2014   Procedure: EXCISION MASS LEFT WRIST ;  Surgeon: Daryll Brod, MD;  Location: [facility];  Service: Orthopedics;  Laterality: Left;  . TONSILLECTOMY  age 30  . TOTAL HIP ARTHROPLASTY  age 47   right hip for avascular necrosis of hip and spine     Social History:  The patient  reports that he quit smoking about 10 years ago. His smoking use included cigarettes. He has a 30.00 pack-year smoking history. he has never used smokeless tobacco. He reports that he does not drink alcohol or use drugs.   Family History:  The patient's family history includes Dementia in his mother; Lung cancer (age of onset: 23) in his sister; Prostate cancer (age of onset: 60) in his father.    ROS:  Please see the history of present illness. All other systems are  reviewed and  Negative to the above problem except as noted.    PHYSICAL EXAM: VS:  BP 124/70   Pulse 61   Ht 5\' 7"  (1.702 m)   Wt 181 lb 1.9 oz (82.2 kg)   SpO2 98%   BMI 28.37 kg/m   GEN: Well nourished, well developed, in no acute distress  HEENT: normal  Neck: no JVD, carotid bruits, or masses Cardiac: RRR; no murmurs, rubs, or gallops,no edema  Respiratory:  clear to auscultation bilaterally, normal work of breathing GI: soft, nontender, nondistended, + BS  No hepatomegaly  MS: no deformity Moving all extremities   Skin: warm and dry, no rash Neuro:  Strength and sensation are intact Psych: euthymic mood, full affect   EKG:  EKG is not ordered today.   Lipid Panel    Component Value Date/Time   CHOL 124 04/19/2017 1021   TRIG 120 04/19/2017 1021   TRIG 107 08/15/2009 1335   HDL 39 (L) 04/19/2017 1021   CHOLHDL 3.2 04/19/2017 1021   CHOLHDL 4.5 01/30/2016 1005   VLDL 24 01/30/2016 1005   LDLCALC 61 04/19/2017 1021   LDLCALC 87 08/15/2009 1335      Wt Readings from Last 3 Encounters:  01/24/18 181 lb 1.9 oz (82.2 kg)  09/30/17 182 lb (82.6 kg)  02/01/17 182 lb (82.6 kg)      ASSESSMENT AND PLAN:  1  CAD  No symptoms to sugg ischemia  DOing good, remains very active   Keep on same regimen   2  HTN  BP is good    Keep on same meds    3  HL Tolerating Repatha  Get labs  4  CV dz  Pt had abnormal Life screen  Noted carotid narrowing and AAA  Will sched to confirm sizes/narrowings  Exam is OK  5  Visual problems  Not sure what represents  Get carotid USN  If recurs needs further eval (ophthy, neuro)  F/U in 1 year   Get CBC, BMET and lipds    Stay active    Current medicines are reviewed at length with the patient today.  The patient does not have concerns regarding medicines.  Signed, Dorris Carnes, MD  01/24/2018 8:50 AM    [facility] [place], [place], [place]  [address] Phone: [phone]; Fax: [phone]

## 2018-01-25 LAB — BASIC METABOLIC PANEL
BUN/Creatinine Ratio: 15 (ref 10–24)
BUN: 14 mg/dL (ref 8–27)
CALCIUM: 9.3 mg/dL (ref 8.6–10.2)
CHLORIDE: 99 mmol/L (ref 96–106)
CO2: 25 mmol/L (ref 20–29)
Creatinine, Ser: 0.95 mg/dL (ref 0.76–1.27)
GFR calc Af Amer: 95 mL/min/{1.73_m2} (ref >59)
GFR calc non Af Amer: 82 mL/min/{1.73_m2} (ref >59)
Glucose: 115 mg/dL — ABNORMAL HIGH (ref 65–99)
POTASSIUM: 4.9 mmol/L (ref 3.5–5.2)
SODIUM: 137 mmol/L (ref 134–144)

## 2018-01-25 LAB — LIPID PANEL
Chol/HDL Ratio: 2.7 ratio (ref 0.0–5.0)
Cholesterol, Total: 112 mg/dL (ref 100–199)
HDL: 41 mg/dL (ref >39)
LDL Calculated: 57 mg/dL (ref 0–99)
Triglycerides: 68 mg/dL (ref 0–149)
VLDL Cholesterol Cal: 14 mg/dL (ref 5–40)

## 2018-01-25 LAB — CBC
Hematocrit: 42 % (ref 37.5–51.0)
Hemoglobin: 14.9 g/dL (ref 13.0–17.7)
MCH: 31.4 pg (ref 26.6–33.0)
MCHC: 35.5 g/dL (ref 31.5–35.7)
MCV: 88 fL (ref 79–97)
PLATELETS: 214 10*3/uL (ref 150–379)
RBC: 4.75 x10E6/uL (ref 4.14–5.80)
RDW: 13.5 % (ref 12.3–15.4)
WBC: 6.9 10*3/uL (ref 3.4–10.8)

## 2018-01-30 DIAGNOSIS — I2510 Atherosclerotic heart disease of native coronary artery without angina pectoris: Secondary | ICD-10-CM | POA: Diagnosis not present

## 2018-01-30 DIAGNOSIS — Z6825 Body mass index (BMI) 25.0-25.9, adult: Secondary | ICD-10-CM | POA: Diagnosis not present

## 2018-01-30 DIAGNOSIS — M6281 Muscle weakness (generalized): Secondary | ICD-10-CM | POA: Diagnosis not present

## 2018-01-30 DIAGNOSIS — L500 Allergic urticaria: Secondary | ICD-10-CM | POA: Diagnosis not present

## 2018-01-30 DIAGNOSIS — I10 Essential (primary) hypertension: Secondary | ICD-10-CM | POA: Diagnosis not present

## 2018-01-30 DIAGNOSIS — E782 Mixed hyperlipidemia: Secondary | ICD-10-CM | POA: Diagnosis not present

## 2018-01-30 DIAGNOSIS — R7309 Other abnormal glucose: Secondary | ICD-10-CM | POA: Diagnosis not present

## 2018-01-30 DIAGNOSIS — Z1389 Encounter for screening for other disorder: Secondary | ICD-10-CM | POA: Diagnosis not present

## 2018-02-04 ENCOUNTER — Other Ambulatory Visit: Payer: Self-pay | Admitting: Internal Medicine

## 2018-02-10 ENCOUNTER — Ambulatory Visit (HOSPITAL_COMMUNITY)
Admission: RE | Admit: 2018-02-10 | Discharge: 2018-02-10 | Disposition: A | Discharge status: Home / Self Care | Payer: Medicare Other | Source: Ambulatory Visit | Attending: Internal Medicine | Admitting: Internal Medicine

## 2018-02-10 DIAGNOSIS — I358 Other nonrheumatic aortic valve disorders: Secondary | ICD-10-CM | POA: Insufficient documentation

## 2018-02-10 DIAGNOSIS — R0989 Other specified symptoms and signs involving the circulatory and respiratory systems: Secondary | ICD-10-CM | POA: Diagnosis not present

## 2018-02-11 ENCOUNTER — Other Ambulatory Visit: Payer: Self-pay | Admitting: Internal Medicine

## 2018-02-17 DIAGNOSIS — J342 Deviated nasal septum: Secondary | ICD-10-CM | POA: Diagnosis not present

## 2018-02-17 DIAGNOSIS — H9313 Tinnitus, bilateral: Secondary | ICD-10-CM | POA: Diagnosis not present

## 2018-04-01 ENCOUNTER — Encounter: Payer: Self-pay | Admitting: Internal Medicine

## 2018-04-04 ENCOUNTER — Other Ambulatory Visit: Payer: Self-pay | Admitting: *Deleted

## 2018-04-24 NOTE — Telephone Encounter (Signed)
I called patient to ask if he could provide where he sees non-rheumatic aortic valve disorder dx on 02/10/18 because I do not see that in his chart. He said it was listed under the heading of health issues.  Advised him that per Dr. Harrington Challenger he does not have aortic stenosis.  He has aortic atherosclerosis.  He is relieved to hear this information.

## 2018-06-04 DIAGNOSIS — R946 Abnormal results of thyroid function studies: Secondary | ICD-10-CM | POA: Diagnosis not present

## 2018-06-04 DIAGNOSIS — Z6825 Body mass index (BMI) 25.0-25.9, adult: Secondary | ICD-10-CM | POA: Diagnosis not present

## 2018-06-04 DIAGNOSIS — E663 Overweight: Secondary | ICD-10-CM | POA: Diagnosis not present

## 2018-06-04 DIAGNOSIS — Z125 Encounter for screening for malignant neoplasm of prostate: Secondary | ICD-10-CM | POA: Diagnosis not present

## 2018-06-04 DIAGNOSIS — Z0001 Encounter for general adult medical examination with abnormal findings: Secondary | ICD-10-CM | POA: Diagnosis not present

## 2018-06-04 DIAGNOSIS — Z1389 Encounter for screening for other disorder: Secondary | ICD-10-CM | POA: Diagnosis not present

## 2018-06-04 DIAGNOSIS — I2510 Atherosclerotic heart disease of native coronary artery without angina pectoris: Secondary | ICD-10-CM | POA: Diagnosis not present

## 2018-06-04 DIAGNOSIS — E782 Mixed hyperlipidemia: Secondary | ICD-10-CM | POA: Diagnosis not present

## 2018-06-04 DIAGNOSIS — R7309 Other abnormal glucose: Secondary | ICD-10-CM | POA: Diagnosis not present

## 2018-06-04 DIAGNOSIS — L500 Allergic urticaria: Secondary | ICD-10-CM | POA: Diagnosis not present

## 2018-06-04 DIAGNOSIS — F419 Anxiety disorder, unspecified: Secondary | ICD-10-CM | POA: Diagnosis not present

## 2018-06-04 DIAGNOSIS — E785 Hyperlipidemia, unspecified: Secondary | ICD-10-CM | POA: Diagnosis not present

## 2018-06-04 DIAGNOSIS — I10 Essential (primary) hypertension: Secondary | ICD-10-CM | POA: Diagnosis not present

## 2018-06-20 DIAGNOSIS — Z6825 Body mass index (BMI) 25.0-25.9, adult: Secondary | ICD-10-CM | POA: Diagnosis not present

## 2018-06-20 DIAGNOSIS — E663 Overweight: Secondary | ICD-10-CM | POA: Diagnosis not present

## 2018-06-20 DIAGNOSIS — S161XXA Strain of muscle, fascia and tendon at neck level, initial encounter: Secondary | ICD-10-CM | POA: Diagnosis not present

## 2018-06-23 DIAGNOSIS — M25511 Pain in right shoulder: Secondary | ICD-10-CM | POA: Diagnosis not present

## 2018-06-23 DIAGNOSIS — S46011A Strain of muscle(s) and tendon(s) of the rotator cuff of right shoulder, initial encounter: Secondary | ICD-10-CM | POA: Diagnosis not present

## 2018-06-28 DIAGNOSIS — M25511 Pain in right shoulder: Secondary | ICD-10-CM | POA: Diagnosis not present

## 2018-07-09 DIAGNOSIS — S46011D Strain of muscle(s) and tendon(s) of the rotator cuff of right shoulder, subsequent encounter: Secondary | ICD-10-CM | POA: Diagnosis not present

## 2018-07-09 DIAGNOSIS — M25511 Pain in right shoulder: Secondary | ICD-10-CM | POA: Diagnosis not present

## 2018-08-05 DIAGNOSIS — M50322 Other cervical disc degeneration at C5-C6 level: Secondary | ICD-10-CM | POA: Diagnosis not present

## 2018-08-05 DIAGNOSIS — M542 Cervicalgia: Secondary | ICD-10-CM | POA: Diagnosis not present

## 2018-08-05 DIAGNOSIS — G8929 Other chronic pain: Secondary | ICD-10-CM | POA: Diagnosis not present

## 2018-08-11 DIAGNOSIS — M25511 Pain in right shoulder: Secondary | ICD-10-CM | POA: Diagnosis not present

## 2018-08-18 DIAGNOSIS — E663 Overweight: Secondary | ICD-10-CM | POA: Diagnosis not present

## 2018-08-18 DIAGNOSIS — M7021 Olecranon bursitis, right elbow: Secondary | ICD-10-CM | POA: Diagnosis not present

## 2018-08-18 DIAGNOSIS — L03113 Cellulitis of right upper limb: Secondary | ICD-10-CM | POA: Diagnosis not present

## 2018-08-18 DIAGNOSIS — Z6825 Body mass index (BMI) 25.0-25.9, adult: Secondary | ICD-10-CM | POA: Diagnosis not present

## 2018-08-19 DIAGNOSIS — L03113 Cellulitis of right upper limb: Secondary | ICD-10-CM | POA: Diagnosis not present

## 2018-08-19 DIAGNOSIS — E663 Overweight: Secondary | ICD-10-CM | POA: Diagnosis not present

## 2018-08-19 DIAGNOSIS — Z6825 Body mass index (BMI) 25.0-25.9, adult: Secondary | ICD-10-CM | POA: Diagnosis not present

## 2018-08-20 DIAGNOSIS — L03113 Cellulitis of right upper limb: Secondary | ICD-10-CM | POA: Diagnosis not present

## 2018-08-28 DIAGNOSIS — Z6825 Body mass index (BMI) 25.0-25.9, adult: Secondary | ICD-10-CM | POA: Diagnosis not present

## 2018-08-28 DIAGNOSIS — E663 Overweight: Secondary | ICD-10-CM | POA: Diagnosis not present

## 2018-08-28 DIAGNOSIS — M25521 Pain in right elbow: Secondary | ICD-10-CM | POA: Diagnosis not present

## 2018-08-28 DIAGNOSIS — M71121 Other infective bursitis, right elbow: Secondary | ICD-10-CM | POA: Diagnosis not present

## 2018-08-28 DIAGNOSIS — M7021 Olecranon bursitis, right elbow: Secondary | ICD-10-CM | POA: Diagnosis not present

## 2018-09-04 DIAGNOSIS — M7021 Olecranon bursitis, right elbow: Secondary | ICD-10-CM | POA: Diagnosis not present

## 2018-09-19 DIAGNOSIS — M7021 Olecranon bursitis, right elbow: Secondary | ICD-10-CM | POA: Diagnosis not present

## 2018-09-23 ENCOUNTER — Other Ambulatory Visit: Payer: Self-pay | Admitting: *Deleted

## 2018-09-23 DIAGNOSIS — I358 Other nonrheumatic aortic valve disorders: Secondary | ICD-10-CM

## 2018-09-23 DIAGNOSIS — I77819 Aortic ectasia, unspecified site: Secondary | ICD-10-CM

## 2018-09-24 ENCOUNTER — Telehealth: Payer: Self-pay

## 2018-09-24 NOTE — Telephone Encounter (Signed)
   Primary Cardiologist:Paula Harrington Challenger, MD  Chart reviewed as part of pre-operative protocol coverage. Because of [name] [name]'s past medical history and time since last visit, he/she will require a follow-up visit in order to better assess preoperative cardiovascular risk.  Pre-op covering staff: - Please schedule appointment and call patient to inform them. - Please contact requesting surgeon's office via preferred method (i.e, phone, fax) to inform them of need for appointment prior to surgery.  Cecilie Kicks, NP  09/24/2018, 4:53 PM

## 2018-09-24 NOTE — Telephone Encounter (Signed)
   [facility] Pre-operative Risk Assessment    Request for surgical clearance:  1. What type of surgery is being performed?  Right elbow olecranon bursectomy  2. When is this surgery scheduled?  10/13/18   3. What type of clearance is required (medical clearance vs. Pharmacy clearance to hold med vs. Both)?  both  4. Are there any medications that need to be held prior to surgery and how long? Aspirin   5. Practice name and name of physician performing surgery?  [facility]/ Dr Caralyn Guile   6. What is your office phone number [phone]    7.   What is your office fax number [phone]  8.   Anesthesia type (None, local, MAC, general) ?  General   [name] [name]  [name] [name] 09/24/2018, 3:56 PM  _________________________________________________________________   (provider comments below)

## 2018-09-25 NOTE — Telephone Encounter (Signed)
Pt aware he needs appt before being cleared for his surgery 10/13/18. Appt with Robbie Lis, PA 09/30/18. I will let the surgeons office know of appt and remove from the pre op call back pool . Pt thanked me for the call.

## 2018-09-30 ENCOUNTER — Ambulatory Visit (INDEPENDENT_AMBULATORY_CARE_PROVIDER_SITE_OTHER): Payer: Medicare Other | Admitting: Physician Assistant

## 2018-09-30 ENCOUNTER — Encounter: Payer: Self-pay | Admitting: Physician Assistant

## 2018-09-30 VITALS — BP 124/78 | HR 60 | Ht 72.0 in | Wt 180.4 lb

## 2018-09-30 DIAGNOSIS — I10 Essential (primary) hypertension: Secondary | ICD-10-CM

## 2018-09-30 DIAGNOSIS — Z01810 Encounter for preprocedural cardiovascular examination: Secondary | ICD-10-CM | POA: Diagnosis not present

## 2018-09-30 DIAGNOSIS — I25810 Atherosclerosis of coronary artery bypass graft(s) without angina pectoris: Secondary | ICD-10-CM | POA: Diagnosis not present

## 2018-09-30 DIAGNOSIS — E785 Hyperlipidemia, unspecified: Secondary | ICD-10-CM | POA: Diagnosis not present

## 2018-09-30 DIAGNOSIS — I739 Peripheral vascular disease, unspecified: Secondary | ICD-10-CM

## 2018-09-30 DIAGNOSIS — I2510 Atherosclerotic heart disease of native coronary artery without angina pectoris: Secondary | ICD-10-CM

## 2018-09-30 MED ORDER — NITROGLYCERIN 0.4 MG SL SUBL: 0.4000 mg | SUBLINGUAL_TABLET | SUBLINGUAL | 3 refills | Status: DC | PRN

## 2018-09-30 NOTE — Patient Instructions (Addendum)
Medication Instructions:  Your physician recommends that you continue on your current medications as directed. Please refer to the Current Medication list given to you today.   Labwork: None ordered  Testing/Procedures:  none ordered  Follow-Up: Your physician recommends that you schedule a follow-up appointment in: 12/2018 WITH DR. ROSS (HAS A RECALL IN )    Any Other Special Instructions Will Be Listed Below (If Applicable).     If you need a refill on your cardiac medications before your next appointment, please call your pharmacy.

## 2018-09-30 NOTE — Progress Notes (Addendum)
Cardiology Office Note    Date:  09/30/2018   ID:  DVONTAE [name], DOB 1951/05/24, MRN [id number]  PCP:  [name] Sites, MD  Cardiologist:  Dr.Ross  Chief Complaint: Surgical clearance for right elbow olecranon bursectomy  History of Present Illness:   [name] [name] is a 67 y.o. male with hx of CAD, HTN, PV disease, and HLD presents for surgical clearance.   History of CAD s/p NSTEMI in 2008 tx with PCI of bifurcational lesion in LAD/Dx with 3 DES. He has a hx of rash with Plavix and Ticlid. He underwent Plavix desensitization in 2009. Statin intolerance. On Repatha.   Low risk stress test 09/2016. Last seen by Dr. Harrington Challenger 12/2017.  Here today for surgical clearance. He walks 30 minutes everyday and yard work without dyspnea or chest tightness. Never required any nitro. The patient denies nausea, vomiting, fever, chest pain, palpitations, shortness of breath, orthopnea, PND, dizziness, syncope, cough, congestion, abdominal pain, hematochezia, melena, lower extremity edema.   Past Medical History:  Diagnosis Date  . Anxiety   . Arthritis    back pain  . Avascular necrosis of bone of hip ([place])    right  . CAD (coronary artery disease)    stents x3 2008  . GERD (gastroesophageal reflux disease)   . History of nuclear stress test    a. Myoview 10/17: EF 56%, diaphragmatic attenuation, no ischemia, low risk  . HTN (hypertension)   . Hyperlipidemia   . Myocardial infarction ([place])   . Paresthesias     Past Surgical History:  Procedure Laterality Date  . CARDIAC CATHETERIZATION     3 stents placed  . COLONOSCOPY  2011 NUR [facility] SCREENING d50 v5   MILD [place] TICS, ? PREP ARTIFACT V. PROCTITIS-rX:[place]. next TCS 2021.  . ESOPHAGEAL DILATION N/A 03/02/2016   Procedure: ESOPHAGEAL DILATION;  Surgeon: [name] Binder, MD;  Location: [facility];  Service: Endoscopy;  Laterality: N/A;  . ESOPHAGOGASTRODUODENOSCOPY  10/03/11   small hiatal hernia/gastric ulcers/stricutre in distal  esophagus  . ESOPHAGOGASTRODUODENOSCOPY N/A 03/02/2016   Dr. Oneida [name]: Schatzki's ring at [place] junction s/p dilation, mild non-erosive gastritis/duodenitis   . MASS EXCISION Left 09/30/2014   Procedure: EXCISION MASS LEFT WRIST ;  Surgeon: Daryll Brod, MD;  Location: [name];  Service: Orthopedics;  Laterality: Left;  . TONSILLECTOMY  age 77  . TOTAL HIP ARTHROPLASTY  age 29   right hip for avascular necrosis of hip and spine    Current Medications: Prior to Admission medications   Medication Sig Start Date End Date Taking? Authorizing Provider  aspirin EC 81 MG tablet Take 1 tablet (81 mg total) by mouth daily. 01/24/18   Fay Records, MD  calcium carbonate (TUMS EX) 750 MG chewable tablet Chew 1 tablet by mouth 3 (three) times daily as needed for heartburn.     [provider]  Coenzyme Q10 200 MG capsule Take 200 mg by mouth daily.    [provider]  famotidine (PEPCID) 20 MG tablet Take 1 tablet (20 mg total) by mouth 2 (two) times daily. 06/04/16   Annitta Needs, NP  levothyroxine (SYNTHROID, LEVOTHROID) 75 MCG tablet Take 75 mcg by mouth daily before breakfast.    [provider]  losartan-hydrochlorothiazide (HYZAAR) 100-25 MG tablet Take 0.5 tablets by mouth 2 (two) times daily. 01/30/16   Fay Records, MD  nitroGLYCERIN (NITROSTAT) 0.4 MG SL tablet Place 0.4 mg under the tongue every 5 (five) minutes as  needed for chest pain. X 3 doses    [provider]  REPATHA SURECLICK 606 MG/ML SOAJ INJECT 1 PEN (140 MG) UNDER THE SKIN (SUBCUTANEOUS INJECTION) ONCE EVERY 14 DAYS 02/11/18   Fay Records, MD  Saw Palmetto, Serenoa repens, (SAW PALMETTO PO) Take 1 tablet by mouth daily. PATIENT NOT SURE OF THE DOSE    [provider]    Allergies:   Atorvastatin; Bee venom; Clopidogrel bisulfate; Other; Penicillin g; Pravastatin; Rosuvastatin; Zetia [ezetimibe]; Penicillins; Plavix  [clopidogrel bisulfate]; Clopidogrel; and Omeprazole   Social  History   Socioeconomic History  . Marital status: Single    Spouse name: Not on file  . Number of children: Not on file  . Years of education: Not on file  . Highest education level: Not on file  Occupational History  . Not on file  Social Needs  . Financial resource strain: Not on file  . Food insecurity:    Worry: Not on file    Inability: Not on file  . Transportation needs:    Medical: Not on file    Non-medical: Not on file  Tobacco Use  . Smoking status: Former Smoker    Packs/day: 1.00    Years: 30.00    Pack years: 30.00    Types: Cigarettes    Last attempt to quit: 09/30/2007    Years since quitting: 11.0  . Smokeless tobacco: Never Used  Substance and Sexual Activity  . Alcohol use: No  . Drug use: No  . Sexual activity: Not on file  Lifestyle  . Physical activity:    Days per week: Not on file    Minutes per session: Not on file  . Stress: Not on file  Relationships  . Social connections:    Talks on phone: Not on file    Gets together: Not on file    Attends religious service: Not on file    Active member of club or organization: Not on file    Attends meetings of clubs or organizations: Not on file    Relationship status: Not on file  Other Topics Concern  . Not on file  Social History Narrative  . Not on file     Family History:  The patient's family history includes Dementia in his mother; Lung cancer (age of onset: 28) in his sister; Prostate cancer (age of onset: 62) in his father.   ROS:   Please see the history of present illness.    ROS All other systems reviewed and are negative.   PHYSICAL EXAM:   VS:  BP 124/78   Pulse 60   Ht 6' (1.829 m)   Wt 180 lb 6.4 oz (81.8 kg)   SpO2 97%   BMI 24.47 kg/m    GEN: Well nourished, well developed, in no acute distress  HEENT: normal  Neck: no JVD, carotid bruits, or masses Cardiac: RRR; no murmurs, rubs, or gallops,no edema  Respiratory:  clear to auscultation bilaterally, normal work of  breathing GI: soft, nontender, nondistended, + BS MS: no deformity or atrophy . Erythema on R elbow Skin: warm and dry, no rash Neuro:  Alert and Oriented x 3, Strength and sensation are intact Psych: euthymic mood, full affect  Wt Readings from Last 3 Encounters:  09/30/18 180 lb 6.4 oz (81.8 kg)  01/24/18 181 lb 1.9 oz (82.2 kg)  09/30/17 182 lb (82.6 kg)      Studies/Labs Reviewed:   EKG:  EKG is ordered today.  The ekg ordered today demonstrates sinus bradycardia at rate of 59 bpm  Recent Labs: 01/24/2018: BUN 14; Creatinine, Ser 0.95; Hemoglobin 14.9; Platelets 214; Potassium 4.9; Sodium 137   Lipid Panel    Component Value Date/Time   CHOL 112 01/24/2018 0926   TRIG 68 01/24/2018 0926   TRIG 107 08/15/2009 1335   HDL 41 01/24/2018 0926   CHOLHDL 2.7 01/24/2018 0926   CHOLHDL 4.5 01/30/2016 1005   VLDL 24 01/30/2016 1005   LDLCALC 57 01/24/2018 0926   LDLCALC 87 08/15/2009 1335    Additional studies/ records that were reviewed today include:   VAS [place] AAA DUPLEX COMPLETE 02/10/2017 Abdominal Aorta: The largest aortic measurement is 2.2 cm. The distal abdominal aorta shows a mild dilatation when compared to the mid abdominal aorta. Stenosis: +-------------------+-------------+ Location      Stenosis    +-------------------+-------------+ Left External Iliac>50% stenosis +-------------------+-------------+  Carotid doppler 01/2018 Final Interpretation: Right Carotid: Velocities in the right ICA are consistent with a 1-39% stenosis.  Left Carotid: Velocities in the left ICA are consistent with a 1-39% stenosis.  Vertebrals: Both vertebral arteries were patent with antegrade flow. Subclavians: Normal flow hemodynamics were seen in bilateral subclavian       arteries.  ASSESSMENT & PLAN:    1. CAD - No angina. Continue ASA and Repatha.   2. HTN BP stable on Hyzaar.   3. HLD -01/24/2018: Cholesterol, Total 112; HDL 41; LDL Calculated 57;  Triglycerides 68  Continue Repatha  4. PV disease - greater than 50% stenosis of left external iliac 01/2018. Plan to repeat study in 2 years.   5. Surgical clearance - No anginal or CHF symptoms. He is easily getting > 4 mets of activity per [facility]. Given past medical history and based on ACC/AHA guidelines, CORDARIUS BENNING would be at acceptable risk for the planned procedure without further cardiovascular testing.   Will ask Dr. Harrington Challenger if okay to hold ASA 5-7 days prior to surgery.    Addendum: Per Dr. Harrington Challenger "OK to hold aspirin 5 days prior to surgery   Rsume after"  Will route to requesting provider by Epic.     Medication Adjustments/Labs and Tests Ordered: Current medicines are reviewed at length with the patient today.  Concerns regarding medicines are outlined above.  Medication changes, Labs and Tests ordered today are listed in the Patient Instructions below. Patient Instructions  Medication Instructions:  Your physician recommends that you continue on your current medications as directed. Please refer to the Current Medication list given to you today.   Labwork: None ordered  Testing/Procedures:  none ordered  Follow-Up: Your physician recommends that you schedule a follow-up appointment in: 12/2018 WITH DR. ROSS (HAS A RECALL IN )    Any Other Special Instructions Will Be Listed Below (If Applicable).     If you need a refill on your cardiac medications before your next appointment, please call your pharmacy.      Jarrett Soho, [place]  09/30/2018 10:08 AM    [facility] [address], [place], [place]  [address] Phone: [phone]; Fax: [phone]

## 2018-09-30 NOTE — Progress Notes (Signed)
OK to hold aspirin 5 days prior to surgery   Rsume after

## 2018-10-13 ENCOUNTER — Other Ambulatory Visit: Payer: Self-pay

## 2018-10-13 DIAGNOSIS — M7021 Olecranon bursitis, right elbow: Secondary | ICD-10-CM | POA: Diagnosis not present

## 2019-01-28 ENCOUNTER — Other Ambulatory Visit: Payer: Self-pay | Admitting: Internal Medicine

## 2019-03-09 DIAGNOSIS — B078 Other viral warts: Secondary | ICD-10-CM | POA: Diagnosis not present

## 2019-03-09 DIAGNOSIS — D210 Benign neoplasm of connective and other soft tissue of head, face and neck: Secondary | ICD-10-CM | POA: Diagnosis not present

## 2019-03-09 DIAGNOSIS — D485 Neoplasm of uncertain behavior of skin: Secondary | ICD-10-CM | POA: Diagnosis not present

## 2019-03-10 DIAGNOSIS — E039 Hypothyroidism, unspecified: Secondary | ICD-10-CM | POA: Diagnosis not present

## 2019-03-10 DIAGNOSIS — I10 Essential (primary) hypertension: Secondary | ICD-10-CM | POA: Diagnosis not present

## 2019-03-10 DIAGNOSIS — F419 Anxiety disorder, unspecified: Secondary | ICD-10-CM | POA: Diagnosis not present

## 2019-03-10 DIAGNOSIS — Z1389 Encounter for screening for other disorder: Secondary | ICD-10-CM | POA: Diagnosis not present

## 2019-03-10 DIAGNOSIS — E7849 Other hyperlipidemia: Secondary | ICD-10-CM | POA: Diagnosis not present

## 2019-03-10 DIAGNOSIS — Z6825 Body mass index (BMI) 25.0-25.9, adult: Secondary | ICD-10-CM | POA: Diagnosis not present

## 2019-03-10 DIAGNOSIS — E663 Overweight: Secondary | ICD-10-CM | POA: Diagnosis not present

## 2019-03-12 ENCOUNTER — Telehealth: Payer: Self-pay | Admitting: *Deleted

## 2019-03-12 NOTE — Telephone Encounter (Signed)
Patient would like a call back about Repatha and the side effects he is experiencing.  He would like to know what to do with the remaining doses he has left.  Please advise.

## 2019-03-13 MED ORDER — PRAVASTATIN SODIUM 40 MG PO TABS: 40.0000 mg | ORAL_TABLET | Freq: Every evening | ORAL | 3 refills | Status: DC

## 2019-03-13 MED ORDER — EZETIMIBE 10 MG PO TABS: 10.0000 mg | ORAL_TABLET | Freq: Every day | ORAL | 3 refills | Status: DC

## 2019-03-13 NOTE — Telephone Encounter (Signed)
Returned call to pt - he states he has noted anxiety and trouble sleeping over the past few months. He has been taking Repatha for the past 2 years. Advised pt that his symptoms are not likely related to his Repatha and to follow up with his PCP. He still wishes to stop his Repatha and would like to resume previous lipid lowering therapy of pravastatin 40mg  daily and Zetia 10mg  daily. He took these previously and experienced palpitations and nausea. He still wishes to resume previous therapy and will let us know if he experiences worse side effects and would like to resume the [place]. If he is tolerating well at his f/u with Dr Harrington Challenger in 1 month, can reschedule f/u labs at that time to assess efficacy.

## 2019-04-17 ENCOUNTER — Ambulatory Visit: Payer: Medicare Other | Admitting: Internal Medicine

## 2019-05-05 ENCOUNTER — Telehealth: Payer: Self-pay | Admitting: Internal Medicine

## 2019-05-05 NOTE — Telephone Encounter (Signed)
Encounter not needed

## 2019-05-26 DIAGNOSIS — M65322 Trigger finger, left index finger: Secondary | ICD-10-CM | POA: Insufficient documentation

## 2019-06-03 DIAGNOSIS — M25551 Pain in right hip: Secondary | ICD-10-CM | POA: Diagnosis not present

## 2019-06-23 DIAGNOSIS — M65322 Trigger finger, left index finger: Secondary | ICD-10-CM | POA: Diagnosis not present

## 2019-08-11 NOTE — Progress Notes (Signed)
Cardiology Office Note    Date:  08/12/2019   ID:  [name] [name], DOB 1951-04-06, MRN [id number]  PCP:  Sharilyn Sites, MD  Cardiologist:  Dr. Harrington Challenger  Chief Complaint: 12 months follow up  History of Present Illness:   [name] [name] is a 68 y.o. male  with hx of CAD, HTN, PV disease, and HLD seen for follow up.   History of CAD s/p NSTEMI in 2008 tx with PCI of bifurcational lesion in LAD/Dx with 3 DES. He has a hx of rash with Plavix and Ticlid. He underwent Plavix desensitization in 2009. Statin intolerance. On Repatha. Low risk stress test 09/2016. He was doing well on cardiac stand point when last seen by me 09/2018 for surgical clearance.   Here today for follow up.  He has stopped Repatha due to nightmares.  It was severe for few days after injecting Repatha.  Resolved after discontinuation.  Currently dealing with insomnia and taking melatonin.  Few weeks ago patient had a 2 incident of exertional substernal chest pressure radiating to his neck and bilateral shoulder.  Resolved with rest within 5 minutes.  Did not require sublingual nitroglycerin.  Unable to tell if this similar to prior angina or not.  He denies dizziness, palpitation, syncope, orthopnea, PND, lower extremity edema or melena.  Past Medical History:  Diagnosis Date  . Anxiety   . Arthritis    back pain  . Avascular necrosis of bone of hip ([place])    right  . CAD (coronary artery disease)    stents x3 2008  . GERD (gastroesophageal reflux disease)   . History of nuclear stress test    a. Myoview 10/17: EF 56%, diaphragmatic attenuation, no ischemia, low risk  . HTN (hypertension)   . Hyperlipidemia   . Myocardial infarction ([place])   . Paresthesias     Past Surgical History:  Procedure Laterality Date  . CARDIAC CATHETERIZATION     3 stents placed  . COLONOSCOPY  2011 NUR [facility] SCREENING d50 v5   MILD [place] TICS, ? PREP ARTIFACT V. PROCTITIS-rX:[place]. next TCS 2021.  . ESOPHAGEAL DILATION N/A 03/02/2016    Procedure: ESOPHAGEAL DILATION;  Surgeon: Danie Binder, MD;  [facility]: [facility];  Service: Endoscopy;  Laterality: N/A;  . ESOPHAGOGASTRODUODENOSCOPY  10/03/11   small hiatal hernia/gastric ulcers/stricutre in distal esophagus  . ESOPHAGOGASTRODUODENOSCOPY N/A 03/02/2016   Dr. Oneida Alar: Schatzki's ring at Benjamin Perez junction s/p dilation, mild non-erosive gastritis/duodenitis   . MASS EXCISION Left 09/30/2014   Procedure: EXCISION MASS LEFT WRIST ;  Surgeon: Daryll Brod, MD;  Location: [place];  Service: Orthopedics;  Laterality: Left;  . TONSILLECTOMY  age 41  . TOTAL HIP ARTHROPLASTY  age 44   right hip for avascular necrosis of hip and spine    Current Medications:  Prior to Admission medications   Medication Sig Start Date End Date Taking? Authorizing Provider  acetaminophen (TYLENOL) 500 MG tablet Take 500 mg by mouth as needed.   Yes [provider]  aspirin EC 81 MG tablet Take 1 tablet (81 mg total) by mouth daily. 01/24/18  Yes Fay Records, MD  calcium carbonate (TUMS EX) 750 MG chewable tablet Chew 1 tablet by mouth 3 (three) times daily as needed for heartburn.    Yes [provider]  Coenzyme Q10 200 MG capsule Take 200 mg by mouth daily.   Yes [provider]  ezetimibe (ZETIA) 10 MG tablet Take 5 mg by mouth  daily.   Yes [provider]  famotidine (PEPCID) 20 MG tablet Take 1 tablet (20 mg total) by mouth 2 (two) times daily. 06/04/16  Yes Annitta Needs, NP  hydrochlorothiazide (HYDRODIURIL) 25 MG tablet Take 12.5 mg by mouth daily. 07/01/19  Yes [provider]  levothyroxine (SYNTHROID, LEVOTHROID) 75 MCG tablet Take 75 mcg by mouth daily before breakfast.   Yes [provider]  losartan (COZAAR) 100 MG tablet Take 50 mg by mouth 2 (two) times daily. 07/09/19  Yes [provider]  nitroGLYCERIN (NITROSTAT) 0.4 MG SL tablet Place 1 tablet (0.4 mg total) under the tongue every 5 (five) minutes as needed for  chest pain. X 3 doses 09/30/18  Yes Donzel Romack, PA  pantoprazole (PROTONIX) 40 MG tablet Take 1 tablet by mouth daily. 03/27/19  Yes [provider]  pravastatin (PRAVACHOL) 40 MG tablet Take 1 tablet (40 mg total) by mouth every evening. 03/13/19 06/11/19  Fay Records, MD   Allergies:   Atorvastatin, Bee venom, Clopidogrel bisulfate, Other, Penicillin g, Pravastatin, Rosuvastatin, Zetia [ezetimibe], Penicillins, Plavix  [clopidogrel bisulfate], Repatha [evolocumab], Clopidogrel, and Omeprazole   Social History   Socioeconomic History  . Marital status: Single    Spouse name: Not on file  . Number of children: Not on file  . Years of education: Not on file  . Highest education level: Not on file  Occupational History  . Not on file  Social Needs  . Financial resource strain: Not on file  . Food insecurity    Worry: Not on file    Inability: Not on file  . Transportation needs    Medical: Not on file    Non-medical: Not on file  Tobacco Use  . Smoking status: Former Smoker    Packs/day: 1.00    Years: 30.00    Pack years: 30.00    Types: Cigarettes    Quit date: 09/30/2007    Years since quitting: 11.8  . Smokeless tobacco: Never Used  Substance and Sexual Activity  . Alcohol use: No  . Drug use: No  . Sexual activity: Not on file  Lifestyle  . Physical activity    Days per week: Not on file    Minutes per session: Not on file  . Stress: Not on file  Relationships  . Social Herbalist on phone: Not on file    Gets together: Not on file    Attends religious service: Not on file    Active member of club or organization: Not on file    Attends meetings of clubs or organizations: Not on file    Relationship status: Not on file  Other Topics Concern  . Not on file  Social History Narrative  . Not on file     Family History:  The patient's family history includes Dementia in his mother; Lung cancer (age of onset: 42) in his sister; Prostate  cancer (age of onset: 22) in his father.   ROS:   Please see the history of present illness.    ROS All other systems reviewed and are negative.   PHYSICAL EXAM:   VS:  BP (!) 144/88   Pulse 68   Ht 6' (1.829 m)   Wt 175 lb 12.8 oz (79.7 kg)   SpO2 98%   BMI 23.84 kg/m    GEN: Well nourished, well developed, in no acute distress  HEENT: normal  Neck: no JVD, carotid bruits, or masses Cardiac: RRR;  no murmurs, rubs, or gallops,no edema  Respiratory:  clear to auscultation bilaterally, normal work of breathing GI: soft, nontender, nondistended, + BS MS: no deformity or atrophy  Skin: warm and dry, no rash Neuro:  Alert and Oriented x 3, Strength and sensation are intact Psych: euthymic mood, full affect  Wt Readings from Last 3 Encounters:  08/12/19 175 lb 12.8 oz (79.7 kg)  09/30/18 180 lb 6.4 oz (81.8 kg)  01/24/18 181 lb 1.9 oz (82.2 kg)      Studies/Labs Reviewed:   EKG:  EKG is not  ordered today.    Recent Labs: No results found for requested labs within last 8760 hours.   Lipid Panel    Component Value Date/Time   CHOL 112 01/24/2018 0926   TRIG 68 01/24/2018 0926   TRIG 107 08/15/2009 1335   HDL 41 01/24/2018 0926   CHOLHDL 2.7 01/24/2018 0926   CHOLHDL 4.5 01/30/2016 1005   VLDL 24 01/30/2016 1005   LDLCALC 57 01/24/2018 0926   LDLCALC 87 08/15/2009 1335    Additional studies/ records that were reviewed today include:   As above  ASSESSMENT & PLAN:    1. CAD -2 episodes of exertional chest pain radiating to his neck and bilateral shoulder.  Resolved spontaneously within 5 minutes.  Did not require sublingual nitroglycerin.  Unable to tell if this is similar to prior angina or not.  Advised to keep nitroglycerin with him.  Will get stress test.  Continue aspirin.  Add Coreg low-dose for antianginal effect and blood pressure.  2. HTN -Blood pressure minimally elevated.  Continue losartan and hydrochlorothiazide at current dose.  Add Coreg as  above.  3. HLD -LDL 64 on 03/02/2019. -Discontinue Repatha due to nightmares. -Currently complains of muscle ache.  Continue Zetia at current dose.  Reduce pravastatin to 20 mg daily. -Reevaluate during office visit.  4. PV disease - Greater than 50% stenosis of left external iliac 01/2018. Plan to repeat study in 2 years.  -No leg pain with walking.    Medication Adjustments/Labs and Tests Ordered: Current medicines are reviewed at length with the patient today.  Concerns regarding medicines are outlined above.  Medication changes, Labs and Tests ordered today are listed in the Patient Instructions below. Patient Instructions  Medication Instructions:  Your physician has recommended you make the following change in your medication:  1.  START Coreg 3/125 taking 1 tablet twice a day 2.  REDUCE the Pravastatin to 20 mg daily   If you need a refill on your cardiac medications before your next appointment, please call your pharmacy.   Lab work: None ordered  If you have labs (blood work) drawn today and your tests are completely normal, you will receive your results only by: Marland Kitchen MyChart Message (if you have MyChart) OR . A paper copy in the mail If you have any lab test that is abnormal or we need to change your treatment, we will call you to review the results.  Testing/Procedures: Your physician has requested that you have a lexiscan myoview. For further information please visit [email or web address]. Please follow instruction sheet, as given.    Follow-Up: At [facility], you and your health needs are our priority.  As part of our continuing mission to provide you with exceptional heart care, we have created designated Provider Care Teams.  These Care Teams include your primary Cardiologist (physician) and Advanced Practice Providers (APPs -  Physician Assistants and Nurse Practitioners) who all work together to provide  you with the care you need, when you need it. You will need  a follow up appointment in:  [place] DR. ROSS OR VIN [place] Lapaglia, PA-C ON A DAY DR. Harrington Challenger IS IN CLINIC   Any Other Special Instructions Will Be Listed Below (If Applicable).    Cardiac Nuclear Scan A cardiac nuclear scan is a test that measures blood flow to the heart when a person is resting and when he or she is exercising. The test looks for problems such as:  Not enough blood reaching a portion of the heart.  The heart muscle not working normally. You may need this test if:  You have heart disease.  You have had abnormal lab results.  You have had heart surgery or a balloon procedure to open up blocked arteries (angioplasty).  You have chest pain.  You have shortness of breath. In this test, a radioactive dye (tracer) is injected into your bloodstream. After the tracer has traveled to your heart, an imaging device is used to measure how much of the tracer is absorbed by or distributed to various areas of your heart. This procedure is usually done at a hospital and takes 2-4 hours. Tell a health care provider about:  Any allergies you have.  All medicines you are taking, including vitamins, herbs, eye drops, creams, and over-the-counter medicines.  Any problems you or family members have had with anesthetic medicines.  Any blood disorders you have.  Any surgeries you have had.  Any medical conditions you have.  Whether you are pregnant or may be pregnant. What are the risks? Generally, this is a safe procedure. However, problems may occur, including:  Serious chest pain and heart attack. This is only a risk if the stress portion of the test is done.  Rapid heartbeat.  Sensation of warmth in your chest. This usually passes quickly.  Allergic reaction to the tracer. What happens before the procedure?  Ask your health care provider about changing or stopping your regular medicines. This is especially important if you are taking diabetes medicines or blood thinners.   Follow instructions from your health care provider about eating or drinking restrictions.  Remove your jewelry on the day of the procedure. What happens during the procedure?  An IV will be inserted into one of your veins.  Your health care provider will inject a small amount of radioactive tracer through the IV.  You will wait for 20-40 minutes while the tracer travels through your bloodstream.  Your heart activity will be monitored with an electrocardiogram (ECG).  You will lie down on an exam table.  Images of your heart will be taken for about 15-20 minutes.  You may also have a stress test. For this test, one of the following may be done: ? You will exercise on a treadmill or stationary bike. While you exercise, your heart's activity will be monitored with an ECG, and your blood pressure will be checked. ? You will be given medicines that will increase blood flow to parts of your heart. This is done if you are unable to exercise.  When blood flow to your heart has peaked, a tracer will again be injected through the IV.  After 20-40 minutes, you will get back on the exam table and have more images taken of your heart.  Depending on the type of tracer used, scans may need to be repeated 3-4 hours later.  Your IV line will be removed when the procedure is over. The procedure  may vary among health care providers and hospitals. What happens after the procedure?  Unless your health care provider tells you otherwise, you may return to your normal schedule, including diet, activities, and medicines.  Unless your health care provider tells you otherwise, you may increase your fluid intake. This will help to flush the contrast dye from your body. Drink enough fluid to keep your urine pale yellow.  Ask your health care provider, or the department that is doing the test: ? When will my results be ready? ? How will I get my results? Summary  A cardiac nuclear scan measures the  blood flow to the heart when a person is resting and when he or she is exercising.  Tell your health care provider if you are pregnant.  Before the procedure, ask your health care provider about changing or stopping your regular medicines. This is especially important if you are taking diabetes medicines or blood thinners.  After the procedure, unless your health care provider tells you otherwise, increase your fluid intake. This will help flush the contrast dye from your body.  After the procedure, unless your health care provider tells you otherwise, you may return to your normal schedule, including diet, activities, and medicines. This information is not intended to replace advice given to you by your health care provider. Make sure you discuss any questions you have with your health care provider. Document Released: 01/11/2005 Document Revised: 06/02/2018 Document Reviewed: 06/02/2018 Elsevier Patient Education  2020 [place], [place], [place]  08/12/2019 10:21 AM    [facility] [place], [place], [place]  [address] Phone: [phone]; Fax: [phone]

## 2019-08-12 ENCOUNTER — Encounter: Payer: Self-pay | Admitting: *Deleted

## 2019-08-12 ENCOUNTER — Encounter: Payer: Self-pay | Admitting: Physician Assistant

## 2019-08-12 ENCOUNTER — Telehealth (HOSPITAL_COMMUNITY): Payer: Self-pay | Admitting: *Deleted

## 2019-08-12 ENCOUNTER — Ambulatory Visit (INDEPENDENT_AMBULATORY_CARE_PROVIDER_SITE_OTHER): Payer: Medicare Other | Admitting: Physician Assistant

## 2019-08-12 ENCOUNTER — Other Ambulatory Visit: Payer: Self-pay

## 2019-08-12 VITALS — BP 144/88 | HR 68 | Ht 72.0 in | Wt 175.8 lb

## 2019-08-12 DIAGNOSIS — I10 Essential (primary) hypertension: Secondary | ICD-10-CM | POA: Diagnosis not present

## 2019-08-12 DIAGNOSIS — R079 Chest pain, unspecified: Secondary | ICD-10-CM

## 2019-08-12 DIAGNOSIS — E785 Hyperlipidemia, unspecified: Secondary | ICD-10-CM | POA: Diagnosis not present

## 2019-08-12 DIAGNOSIS — I739 Peripheral vascular disease, unspecified: Secondary | ICD-10-CM

## 2019-08-12 DIAGNOSIS — I25110 Atherosclerotic heart disease of native coronary artery with unstable angina pectoris: Secondary | ICD-10-CM | POA: Diagnosis not present

## 2019-08-12 DIAGNOSIS — J019 Acute sinusitis, unspecified: Secondary | ICD-10-CM | POA: Diagnosis not present

## 2019-08-12 MED ORDER — CARVEDILOL 3.125 MG PO TABS: 3.1250 mg | ORAL_TABLET | Freq: Two times a day (BID) | ORAL | 3 refills | Status: DC

## 2019-08-12 MED ORDER — PRAVASTATIN SODIUM 20 MG PO TABS: 20.0000 mg | ORAL_TABLET | Freq: Every evening | ORAL | 3 refills | Status: DC

## 2019-08-12 NOTE — Telephone Encounter (Signed)
Patient given detailed instructions per Myocardial Perfusion Study Information Sheet for the test on 08/19/19. Patient notified to arrive 15 minutes early and that it is imperative to arrive on time for appointment to keep from having the test rescheduled.  If you need to cancel or reschedule your appointment, please call the office within 24 hours of your appointment. . Patient verbalized understanding. [name] [name]

## 2019-08-12 NOTE — Patient Instructions (Addendum)
Medication Instructions:  Your physician has recommended you make the following change in your medication:  1.  START Coreg 3/125 taking 1 tablet twice a day 2.  REDUCE the Pravastatin to 20 mg daily   If you need a refill on your cardiac medications before your next appointment, please call your pharmacy.   Lab work: None ordered  If you have labs (blood work) drawn today and your tests are completely normal, you will receive your results only by: Marland Kitchen MyChart Message (if you have MyChart) OR . A paper copy in the mail If you have any lab test that is abnormal or we need to change your treatment, we will call you to review the results.  Testing/Procedures: Your physician has requested that you have a lexiscan myoview. For further information please visit [email or web address]. Please follow instruction sheet, as given.    Follow-Up: At [facility], you and your health needs are our priority.  As part of our continuing mission to provide you with exceptional heart care, we have created designated Provider Care Teams.  These Care Teams include your primary Cardiologist (physician) and Advanced Practice Providers (APPs -  Physician Assistants and Nurse Practitioners) who all work together to provide you with the care you need, when you need it. You will need a follow up appointment in:  [place] DR. ROSS OR VIN BHAGAT, PA-C ON A DAY DR. Harrington Challenger IS IN CLINIC   Any Other Special Instructions Will Be Listed Below (If Applicable).    Cardiac Nuclear Scan A cardiac nuclear scan is a test that measures blood flow to the heart when a person is resting and when he or she is exercising. The test looks for problems such as:  Not enough blood reaching a portion of the heart.  The heart muscle not working normally. You may need this test if:  You have heart disease.  You have had abnormal lab results.  You have had heart surgery or a balloon procedure to open up blocked arteries  (angioplasty).  You have chest pain.  You have shortness of breath. In this test, a radioactive dye (tracer) is injected into your bloodstream. After the tracer has traveled to your heart, an imaging device is used to measure how much of the tracer is absorbed by or distributed to various areas of your heart. This procedure is usually done at a hospital and takes 2-4 hours. Tell a health care provider about:  Any allergies you have.  All medicines you are taking, including vitamins, herbs, eye drops, creams, and over-the-counter medicines.  Any problems you or family members have had with anesthetic medicines.  Any blood disorders you have.  Any surgeries you have had.  Any medical conditions you have.  Whether you are pregnant or may be pregnant. What are the risks? Generally, this is a safe procedure. However, problems may occur, including:  Serious chest pain and heart attack. This is only a risk if the stress portion of the test is done.  Rapid heartbeat.  Sensation of warmth in your chest. This usually passes quickly.  Allergic reaction to the tracer. What happens before the procedure?  Ask your health care provider about changing or stopping your regular medicines. This is especially important if you are taking diabetes medicines or blood thinners.  Follow instructions from your health care provider about eating or drinking restrictions.  Remove your jewelry on the day of the procedure. What happens during the procedure?  An IV will be inserted  into one of your veins.  Your health care provider will inject a small amount of radioactive tracer through the IV.  You will wait for 20-40 minutes while the tracer travels through your bloodstream.  Your heart activity will be monitored with an electrocardiogram (ECG).  You will lie down on an exam table.  Images of your heart will be taken for about 15-20 minutes.  You may also have a stress test. For this test, one  of the following may be done: ? You will exercise on a treadmill or stationary bike. While you exercise, your heart's activity will be monitored with an ECG, and your blood pressure will be checked. ? You will be given medicines that will increase blood flow to parts of your heart. This is done if you are unable to exercise.  When blood flow to your heart has peaked, a tracer will again be injected through the IV.  After 20-40 minutes, you will get back on the exam table and have more images taken of your heart.  Depending on the type of tracer used, scans may need to be repeated 3-4 hours later.  Your IV line will be removed when the procedure is over. The procedure may vary among health care providers and hospitals. What happens after the procedure?  Unless your health care provider tells you otherwise, you may return to your normal schedule, including diet, activities, and medicines.  Unless your health care provider tells you otherwise, you may increase your fluid intake. This will help to flush the contrast dye from your body. Drink enough fluid to keep your urine pale yellow.  Ask your health care provider, or the department that is doing the test: ? When will my results be ready? ? How will I get my results? Summary  A cardiac nuclear scan measures the blood flow to the heart when a person is resting and when he or she is exercising.  Tell your health care provider if you are pregnant.  Before the procedure, ask your health care provider about changing or stopping your regular medicines. This is especially important if you are taking diabetes medicines or blood thinners.  After the procedure, unless your health care provider tells you otherwise, increase your fluid intake. This will help flush the contrast dye from your body.  After the procedure, unless your health care provider tells you otherwise, you may return to your normal schedule, including diet, activities, and  medicines. This information is not intended to replace advice given to you by your health care provider. Make sure you discuss any questions you have with your health care provider. Document Released: 01/11/2005 Document Revised: 06/02/2018 Document Reviewed: 06/02/2018 Elsevier Patient Education  2020 [employer].

## 2019-08-19 ENCOUNTER — Encounter (HOSPITAL_COMMUNITY): Payer: Self-pay

## 2019-08-19 ENCOUNTER — Ambulatory Visit (HOSPITAL_COMMUNITY): Payer: Medicare Other | Attending: Cardiology

## 2019-08-19 ENCOUNTER — Other Ambulatory Visit: Payer: Self-pay

## 2019-08-19 DIAGNOSIS — R079 Chest pain, unspecified: Secondary | ICD-10-CM | POA: Insufficient documentation

## 2019-08-19 LAB — MYOCARDIAL PERFUSION IMAGING
LV dias vol: 79 mL (ref 62–150)
LV sys vol: 26 mL
Peak HR: 96 {beats}/min
Rest HR: 59 {beats}/min
SDS: 0
SRS: 0
SSS: 0
TID: 1.08

## 2019-08-19 MED ORDER — TECHNETIUM TC 99M TETROFOSMIN IV KIT
32.6000 | PACK | Freq: Once | INTRAVENOUS | Status: AC | PRN
  Administered 2019-08-19: 32.6 via INTRAVENOUS
  Filled 2019-08-19: qty 33

## 2019-08-19 MED ORDER — TECHNETIUM TC 99M TETROFOSMIN IV KIT
10.3000 | PACK | Freq: Once | INTRAVENOUS | Status: AC | PRN
  Administered 2019-08-19: 10.3 via INTRAVENOUS
  Filled 2019-08-19: qty 11

## 2019-08-19 MED ORDER — REGADENOSON 0.4 MG/5ML IV SOLN
0.4000 mg | Freq: Once | INTRAVENOUS | Status: AC
  Administered 2019-08-19: 0.4 mg via INTRAVENOUS

## 2019-08-20 NOTE — Progress Notes (Signed)
Pt has been made aware of normal result and verbalized understanding.  jw 08/20/2019

## 2019-08-26 DIAGNOSIS — R7309 Other abnormal glucose: Secondary | ICD-10-CM | POA: Diagnosis not present

## 2019-08-26 DIAGNOSIS — E7849 Other hyperlipidemia: Secondary | ICD-10-CM | POA: Diagnosis not present

## 2019-09-01 DIAGNOSIS — Z23 Encounter for immunization: Secondary | ICD-10-CM | POA: Diagnosis not present

## 2019-09-11 NOTE — Progress Notes (Signed)
Cardiology Office Note    Date:  09/14/2019   ID:  [name] [name], DOB 1951/07/21, MRN [id number]  PCP:  Sharilyn Sites, MD  Cardiologist: Dr. Harrington Challenger  Chief Complaint: 4 weeks follow up  History of Present Illness:   [name] [name] is a 68 y.o. male  with hx of CAD, HTN, PV disease, and HLD seen for follow up.   History of CAD s/p NSTEMI in 2008 tx with PCI of bifurcational lesion in LAD/Dx with 3DES.He has a hx of rash with Plavix and Ticlid. He underwent Plavix desensitization in 2009. Statin and Repatha (caused nightmares) intolerance. Low risk stress test 09/2016.  Seem by me for chest pain 08/12/2019. Exertional chest pain radiating to his neck and bilateral shoulder.  Resolved spontaneously within 5 minutes.  Did not require sublingual nitroglycerin. Elevated BP. Added coreg for antianginal and high BP.  Follow up stress test low risk without ischemia.   Here today for follow up. He thinks CP resolved after addition of coreg. Mild epigastric discomfort while bending down. No fever, chills, cough, congestion, dyspnea, palpitations, orthopnea, PND, syncope or LE edema. Muscle ache resolved after reduction of pravastatin.   Past Medical History:  Diagnosis Date  . Anxiety   . Arthritis    back pain  . Avascular necrosis of bone of hip ([place])    right  . CAD (coronary artery disease)    stents x3 2008  . GERD (gastroesophageal reflux disease)   . History of nuclear stress test    a. Myoview 10/17: EF 56%, diaphragmatic attenuation, no ischemia, low risk  . HTN (hypertension)   . Hyperlipidemia   . Myocardial infarction ([place])   . Paresthesias     Past Surgical History:  Procedure Laterality Date  . CARDIAC CATHETERIZATION     3 stents placed  . COLONOSCOPY  2011 NUR [facility] SCREENING d50 v5   MILD [place] TICS, ? PREP ARTIFACT V. PROCTITIS-rX:[place]. next TCS 2021.  . ESOPHAGEAL DILATION N/A 03/02/2016   Procedure: ESOPHAGEAL DILATION;  Surgeon: Danie Binder, MD;   [facility];  Service: Endoscopy;  Laterality: N/A;  . ESOPHAGOGASTRODUODENOSCOPY  10/03/11   small hiatal hernia/gastric ulcers/stricutre in distal esophagus  . ESOPHAGOGASTRODUODENOSCOPY N/A 03/02/2016   Dr. Oneida Alar: Schatzki's ring at [place] junction s/p dilation, mild non-erosive gastritis/duodenitis   . MASS EXCISION Left 09/30/2014   Procedure: EXCISION MASS LEFT WRIST ;  Surgeon: Daryll Brod, MD;  Location: [place];  Service: Orthopedics;  Laterality: Left;  . TONSILLECTOMY  age 1  . TOTAL HIP ARTHROPLASTY  age 7   right hip for avascular necrosis of hip and spine    Current Medications: Prior to Admission medications   Medication Sig Start Date End Date Taking? Authorizing Provider  acetaminophen (TYLENOL) 500 MG tablet Take 500 mg by mouth as needed.    [provider]  aspirin EC 81 MG tablet Take 1 tablet (81 mg total) by mouth daily. 01/24/18   Fay Records, MD  calcium carbonate (TUMS EX) 750 MG chewable tablet Chew 1 tablet by mouth 3 (three) times daily as needed for heartburn.     [provider]  carvedilol (COREG) 3.125 MG tablet Take 1 tablet (3.125 mg total) by mouth 2 (two) times daily with a meal. 08/12/19   Samay Delcarlo, PA  Coenzyme Q10 200 MG capsule Take 200 mg by mouth daily.    [provider]  ezetimibe (ZETIA) 10 MG tablet Take 5 mg  by mouth daily.    [provider]  famotidine (PEPCID) 20 MG tablet Take 1 tablet (20 mg total) by mouth 2 (two) times daily. 06/04/16   Annitta Needs, NP  hydrochlorothiazide (HYDRODIURIL) 25 MG tablet Take 12.5 mg by mouth daily. 07/01/19   [provider]  levothyroxine (SYNTHROID, LEVOTHROID) 75 MCG tablet Take 75 mcg by mouth daily before breakfast.    [provider]  losartan (COZAAR) 100 MG tablet Take 50 mg by mouth 2 (two) times daily. 07/09/19   [provider]  nitroGLYCERIN (NITROSTAT) 0.4 MG SL tablet Place 1 tablet (0.4 mg total)  under the tongue every 5 (five) minutes as needed for chest pain. X 3 doses 09/30/18   Anyah Swallow, PA  pantoprazole (PROTONIX) 40 MG tablet Take 1 tablet by mouth daily. 03/27/19   [provider]  pravastatin (PRAVACHOL) 20 MG tablet Take 1 tablet (20 mg total) by mouth every evening. 08/12/19 11/10/19  Leanor Kail, PA    Allergies:   Atorvastatin, Bee venom, Clopidogrel bisulfate, Other, Penicillin g, Pravastatin, Rosuvastatin, Zetia [ezetimibe], Penicillins, Plavix  [clopidogrel bisulfate], Repatha [evolocumab], Clopidogrel, and Omeprazole   Social History   Socioeconomic History  . Marital status: Single    Spouse name: Not on file  . Number of children: Not on file  . Years of education: Not on file  . Highest education level: Not on file  Occupational History  . Not on file  Social Needs  . Financial resource strain: Not on file  . Food insecurity    Worry: Not on file    Inability: Not on file  . Transportation needs    Medical: Not on file    Non-medical: Not on file  Tobacco Use  . Smoking status: Former Smoker    Packs/day: 1.00    Years: 30.00    Pack years: 30.00    Types: Cigarettes    Quit date: 09/30/2007    Years since quitting: 11.9  . Smokeless tobacco: Never Used  Substance and Sexual Activity  . Alcohol use: No  . Drug use: No  . Sexual activity: Not on file  Lifestyle  . Physical activity    Days per week: Not on file    Minutes per session: Not on file  . Stress: Not on file  Relationships  . Social Herbalist on phone: Not on file    Gets together: Not on file    Attends religious service: Not on file    Active member of club or organization: Not on file    Attends meetings of clubs or organizations: Not on file    Relationship status: Not on file  Other Topics Concern  . Not on file  Social History Narrative  . Not on file     Family History:  The patient's family history includes Dementia in his mother;  Lung cancer (age of onset: 9) in his sister; Prostate cancer (age of onset: 67) in his father.  ROS:   Please see the history of present illness.    ROS All other systems reviewed and are negative.   PHYSICAL EXAM:   VS:  BP 130/90   Pulse 69   Ht 6' (1.829 m)   Wt 175 lb (79.4 kg)   SpO2 98%   BMI 23.73 kg/m    GEN: Well nourished, well developed, in no acute distress  HEENT: normal  Neck: no JVD, carotid bruits, or masses Cardiac: RRR; no murmurs,  rubs, or gallops,no edema  Respiratory:  clear to auscultation bilaterally, normal work of breathing GI: soft, nontender, nondistended, + BS MS: no deformity or atrophy  Skin: warm and dry, no rash Neuro:  Alert and Oriented x 3, Strength and sensation are intact Psych: euthymic mood, full affect  Wt Readings from Last 3 Encounters:  09/14/19 175 lb (79.4 kg)  08/12/19 175 lb 12.8 oz (79.7 kg)  09/30/18 180 lb 6.4 oz (81.8 kg)      Studies/Labs Reviewed:   EKG:  EKG is no ordered today.    Recent Labs: No results found for requested labs within last 8760 hours.   Lipid Panel    Component Value Date/Time   CHOL 112 01/24/2018 0926   TRIG 68 01/24/2018 0926   TRIG 107 08/15/2009 1335   HDL 41 01/24/2018 0926   CHOLHDL 2.7 01/24/2018 0926   CHOLHDL 4.5 01/30/2016 1005   VLDL 24 01/30/2016 1005   LDLCALC 57 01/24/2018 0926   LDLCALC 87 08/15/2009 1335    Additional studies/ records that were reviewed today include:   Stress test 08/19/2019  Nuclear stress EF: 67%. No wall motion abnormalities  There was no ST segment deviation noted during stress.  This is a low risk study. There are no perfusion defects, no evidence of ischemia identified.  The study is normal.   Candee Furbish, MD    ASSESSMENT & PLAN:   1. CAD -Low risk stress test. No recurrent chest pain. Continue ASA, coreg and statin.   2. HTN - Now stable on current medications.  3. HLD -LDL 95 few weeks ago.  -Discontinue Repatha due to  nightmares. -Muscle ache resolved after reduced pravastatin to 20 mg daily. Continue zetia -He wants to defer reevaluation at lipid clinic or referral to Dr. Debara Pickett.   4. PV disease - Greater than 50% stenosis of left external iliac 01/2018. Plan to repeat study in 2 years. -No claudications    Medication Adjustments/Labs and Tests Ordered: Current medicines are reviewed at length with the patient today.  Concerns regarding medicines are outlined above.  Medication changes, Labs and Tests ordered today are listed in the Patient Instructions below. Patient Instructions  Medication Instructions:  Your physician recommends that you continue on your current medications as directed. Please refer to the Current Medication list given to you today.  If you need a refill on your cardiac medications before your next appointment, please call your pharmacy.   Lab work: None ordered  If you have labs (blood work) drawn today and your tests are completely normal, you will receive your results only by: Marland Kitchen MyChart Message (if you have MyChart) OR . A paper copy in the mail If you have any lab test that is abnormal or we need to change your treatment, we will call you to review the results.  Testing/Procedures: None ordered  Follow-Up: At [facility], you and your health needs are our priority.  As part of our continuing mission to provide you with exceptional heart care, we have created designated Provider Care Teams.  These Care Teams include your primary Cardiologist (physician) and Advanced Practice Providers (APPs -  Physician Assistants and Nurse Practitioners) who all work together to provide you with the care you need, when you need it. You will need a follow up appointment in:  6 months.  Please call our office 2 months in advance to schedule this appointment.  You may see Dorris Carnes, MD or one of the following Advanced Practice  Providers on your designated Care Team: Richardson Dopp, PA-C  Vin Ash[place], [place] . Daune Perch, NP  Any Other Special Instructions Will Be Listed Below (If Applicable).       Jarrett Soho, [place]  09/14/2019 9:47 AM    [facility] [address], [place], [place]  [address] Phone: [phone]; Fax: [phone]

## 2019-09-14 ENCOUNTER — Other Ambulatory Visit: Payer: Self-pay

## 2019-09-14 ENCOUNTER — Ambulatory Visit (INDEPENDENT_AMBULATORY_CARE_PROVIDER_SITE_OTHER): Payer: Medicare Other | Admitting: Physician Assistant

## 2019-09-14 ENCOUNTER — Encounter: Payer: Self-pay | Admitting: Physician Assistant

## 2019-09-14 VITALS — BP 130/90 | HR 69 | Ht 72.0 in | Wt 175.0 lb

## 2019-09-14 DIAGNOSIS — I10 Essential (primary) hypertension: Secondary | ICD-10-CM | POA: Diagnosis not present

## 2019-09-14 DIAGNOSIS — I739 Peripheral vascular disease, unspecified: Secondary | ICD-10-CM

## 2019-09-14 DIAGNOSIS — E785 Hyperlipidemia, unspecified: Secondary | ICD-10-CM

## 2019-09-14 DIAGNOSIS — I25110 Atherosclerotic heart disease of native coronary artery with unstable angina pectoris: Secondary | ICD-10-CM | POA: Diagnosis not present

## 2019-09-14 MED ORDER — CARVEDILOL 3.125 MG PO TABS: 3.1250 mg | ORAL_TABLET | Freq: Two times a day (BID) | ORAL | 3 refills | Status: DC

## 2019-09-14 NOTE — Patient Instructions (Signed)
Medication Instructions:  Your physician recommends that you continue on your current medications as directed. Please refer to the Current Medication list given to you today.  If you need a refill on your cardiac medications before your next appointment, please call your pharmacy.   Lab work: None ordered  If you have labs (blood work) drawn today and your tests are completely normal, you will receive your results only by: . MyChart Message (if you have MyChart) OR . A paper copy in the mail If you have any lab test that is abnormal or we need to change your treatment, we will call you to review the results.  Testing/Procedures: None ordered   Follow-Up: At [facility], you and your health needs are our priority.  As part of our continuing mission to provide you with exceptional heart care, we have created designated Provider Care Teams.  These Care Teams include your primary Cardiologist (physician) and Advanced Practice Providers (APPs -  Physician Assistants and Nurse Practitioners) who all work together to provide you with the care you need, when you need it. You will need a follow up appointment in:  6 months.  Please call our office 2 months in advance to schedule this appointment.  You may see Paula Ross, MD or one of the following Advanced Practice Providers on your designated Care Team: Scott Weaver, PA-C Vin Bhagat, PA-C . Janine Hammond, NP  Any Other Special Instructions Will Be Listed Below (If Applicable).    

## 2019-10-23 ENCOUNTER — Telehealth: Payer: Self-pay | Admitting: Pharmacist

## 2019-10-23 NOTE — Telephone Encounter (Signed)
Patient req to have his address changed.

## 2019-10-27 ENCOUNTER — Telehealth: Payer: Self-pay | Admitting: *Deleted

## 2019-10-27 DIAGNOSIS — Z006 Encounter for examination for normal comparison and control in clinical research program: Secondary | ICD-10-CM

## 2019-10-27 NOTE — Telephone Encounter (Signed)
[facility]OULD EDU Informed Consent   Subject Name: THANH MOTTERN  Subject met inclusion and exclusion criteria.  The informed consent form, study requirements and expectations were reviewed with the subject and questions and concerns were addressed prior to the signing of the consent form.  The subject verbalized understanding of the trial requirements.  The subject agreed to participate in the [place] Edu trial and gave verbal consent to participate in the study on 10/23/2019.  The informed consent was obtained prior to performance of any protocol-specific procedures for the subject.  A copy of the signed informed consent was mailed to the subject and a copy was placed in the subject's medical record.   Oletta Cohn.

## 2019-12-15 ENCOUNTER — Encounter: Payer: Self-pay | Admitting: Gastroenterology

## 2020-01-04 ENCOUNTER — Encounter: Payer: Self-pay | Admitting: Gastroenterology

## 2020-01-06 DIAGNOSIS — M65322 Trigger finger, left index finger: Secondary | ICD-10-CM | POA: Diagnosis not present

## 2020-01-06 DIAGNOSIS — M2560 Stiffness of unspecified joint, not elsewhere classified: Secondary | ICD-10-CM | POA: Diagnosis not present

## 2020-01-13 DIAGNOSIS — M25642 Stiffness of left hand, not elsewhere classified: Secondary | ICD-10-CM | POA: Diagnosis not present

## 2020-01-13 DIAGNOSIS — M79645 Pain in left finger(s): Secondary | ICD-10-CM | POA: Diagnosis not present

## 2020-01-13 DIAGNOSIS — M65322 Trigger finger, left index finger: Secondary | ICD-10-CM | POA: Diagnosis not present

## 2020-01-27 ENCOUNTER — Telehealth: Payer: Self-pay

## 2020-01-27 ENCOUNTER — Ambulatory Visit (INDEPENDENT_AMBULATORY_CARE_PROVIDER_SITE_OTHER): Payer: Medicare Other | Admitting: Gastroenterology

## 2020-01-27 ENCOUNTER — Encounter: Payer: Self-pay | Admitting: Gastroenterology

## 2020-01-27 ENCOUNTER — Other Ambulatory Visit: Payer: Self-pay

## 2020-01-27 VITALS — BP 128/81 | HR 70 | Temp 96.8°F | Ht 72.0 in | Wt 181.8 lb

## 2020-01-27 DIAGNOSIS — Z1211 Encounter for screening for malignant neoplasm of colon: Secondary | ICD-10-CM | POA: Insufficient documentation

## 2020-01-27 DIAGNOSIS — K229 Disease of esophagus, unspecified: Secondary | ICD-10-CM

## 2020-01-27 DIAGNOSIS — R1310 Dysphagia, unspecified: Secondary | ICD-10-CM

## 2020-01-27 DIAGNOSIS — K2100 Gastro-esophageal reflux disease with esophagitis, without bleeding: Secondary | ICD-10-CM | POA: Diagnosis not present

## 2020-01-27 DIAGNOSIS — R1319 Other dysphagia: Secondary | ICD-10-CM

## 2020-01-27 NOTE — Progress Notes (Addendum)
Primary Care Physician:  Sharilyn Sites, MD [facility]erring provider: [facility]  Primary Gastroenterologist:  Barney Drain, MD  REVIEWED-NO ADDITIONAL RECOMMENDATIONS.   Chief Complaint  Patient presents with  . Dysphagia    Food getting stuck, chest discomfort    HPI:  [name] [name] is a 69 y.o. male [facility] request of [facility] for [facility] evaluation of dysphagia.  Patient last seen in June 2017.  He also has chronic GERD.  Previously intolerant to omeprazole, would wake him up in the middle the night with palpitations.  Also felt that it caused him "brain fog". Previously did well on Pepcid.  EGD March 2017 with Schatzki ring at the GE junction status post dilation, mild nonerosive gastritis/duodenitis. STATES HE WOKE UP DURING PROCEDURE  Colonoscopy in 2011 with mild sigmoid colon diverticula,?Prep artifact versus proctitis prescribed [place], next colonoscopy 2021.  Patient had a EGD in March 2020 for dysphagia, at [facility].  Noted to have duodenitis. Stricture that was "too wide" for a [place] (tried 44 mm/45 [place] and the opening was much wider than left).  Bleeding from esophageal biopsy ?Variceal at the location. Hot biopsy forceps to control bleeding.  Esophageal biopsy with active esophagitis with features of reflux. Focal epithelial atypia indeterminate for dysplasia.  Antral biopsy with mild chronic nonspecific gastritis, negative for H. Pylori. Was supposed to have EGD in six months. [facility] so [facility].  Some pain in center of chest into back, occurs 30 minutes after taking pills. Feels like pills stick in chest and dissolve there causing pain. Some intermittent dysphagia to solid food. Helps to drink plenty of fluids. Always happens with [employer]ast beef at [employer]. Denies hearburn. He takes Pepcid in AM, protonix at lunch, Pepcid at night. Denies abdominal pain, constipation, diarrhea, melena, brbpr.    Current Outpatient Medications  Medication Sig  Dispense Refill  . acetaminophen (TYLENOL) 500 MG tablet Take 500 mg by mouth as needed.    Marland Kitchen aspirin EC 81 MG tablet Take 1 tablet (81 mg total) by mouth daily. 90 tablet 3  . calcium carbonate (TUMS EX) 750 MG chewable tablet Chew 1 tablet by mouth 3 (three) times daily as needed for heartburn.     . carvedilol (COREG) 3.125 MG tablet Take 1 tablet (3.125 mg total) by mouth 2 (two) times daily with a meal. 180 tablet 3  . ezetimibe (ZETIA) 10 MG tablet Take 5 mg by mouth daily.    . famotidine (PEPCID) 20 MG tablet Take 1 tablet (20 mg total) by mouth 2 (two) times daily. 180 tablet 3  . hydrochlorothiazide (HYDRODIURIL) 25 MG tablet Take 12.5 mg by mouth daily.    Marland Kitchen levothyroxine (SYNTHROID, LEVOTHROID) 75 MCG tablet Take 75 mcg by mouth daily before breakfast.    . losartan (COZAAR) 100 MG tablet Take 50 mg by mouth 2 (two) times daily.    . nitroGLYCERIN (NITROSTAT) 0.4 MG SL tablet Place 1 tablet (0.4 mg total) under the tongue every 5 (five) minutes as needed for chest pain. X 3 doses 25 tablet 3  . pantoprazole (PROTONIX) 40 MG tablet Take 1 tablet by mouth daily.    . pravastatin (PRAVACHOL) 20 MG tablet Take 1 tablet (20 mg total) by mouth every evening. 90 tablet 3   No current facility-administered medications for this visit.    Allergies as of 01/27/2020 - Review Complete 01/27/2020  Allergen Reaction Noted  . Atorvastatin Other (See Comments) 08/17/2014  . Bee venom Hives  and Swelling 09/24/2011  . Clopidogrel bisulfate Hives 02/20/2010  . Other Anaphylaxis 05/22/2016  . Penicillin g  05/22/2016  . Pravastatin  09/03/2017  . Rosuvastatin Other (See Comments) 08/17/2014  . Penicillins  02/20/2010  . Plavix  [clopidogrel bisulfate] Hives 09/30/2018  . Repatha [evolocumab]  03/13/2019  . Clopidogrel Rash and Hives 05/22/2016  . Omeprazole  02/04/2018    Past Medical History:  Diagnosis Date  . Anxiety   . Arthritis    back pain  . Avascular necrosis of bone of hip  ([place])    right  . CAD (coronary artery disease)    stents x3 2008  . GERD (gastroesophageal reflux disease)   . History of nuclear stress test    a. Myoview 10/17: EF 56%, diaphragmatic attenuation, no ischemia, low risk  . HTN (hypertension)   . Hyperlipidemia   . Myocardial infarction ([place])   . Paresthesias     Past Surgical History:  Procedure Laterality Date  . CARDIAC CATHETERIZATION     3 stents placed  . COLONOSCOPY  2011 NUR [facility] SCREENING d50 v5   MILD [place] TICS, ? PREP ARTIFACT V. PROCTITIS-rX:[place]. next TCS 2021.  . ESOPHAGEAL DILATION N/A 03/02/2016   Procedure: ESOPHAGEAL DILATION;  Surgeon: Danie Binder, MD;  [facility];  Service: Endoscopy;  Laterality: N/A;  . ESOPHAGOGASTRODUODENOSCOPY  10/03/11   small hiatal hernia/gastric ulcers/stricutre in distal esophagus  . ESOPHAGOGASTRODUODENOSCOPY N/A 03/02/2016   Dr. Oneida Alar: Schatzki's ring at [place] junction s/p dilation, mild non-erosive gastritis/duodenitis   . MASS EXCISION Left 09/30/2014   Procedure: EXCISION MASS LEFT WRIST ;  Surgeon: Daryll Brod, MD;  Location: [place];  Service: Orthopedics;  Laterality: Left;  . TONSILLECTOMY  age 62  . TOTAL HIP ARTHROPLASTY  age 2   right hip for avascular necrosis of hip and spine    Family History  Problem Relation Age of Onset  . Dementia Mother   . Prostate cancer Father 81  . Lung cancer Sister 80  . Colon cancer Neg Hx   . Colon polyps Neg Hx   . Anesthesia problems Neg Hx   . Hypotension Neg Hx   . Malignant hyperthermia Neg Hx   . Pseudochol deficiency Neg Hx     Social History   Socioeconomic History  . Marital status: Single    Spouse name: Not on file  . Number of children: Not on file  . Years of education: Not on file  . Highest education level: Not on file  Occupational History  . Not on file  Tobacco Use  . Smoking status: Former Smoker    Packs/day: 1.00    Years: 30.00    Pack years: 30.00    Types: Cigarettes     Quit date: 09/30/2007    Years since quitting: 12.3  . Smokeless tobacco: Never Used  Substance and Sexual Activity  . Alcohol use: No  . Drug use: No  . Sexual activity: Not on file  Other Topics Concern  . Not on file  Social History Narrative  . Not on file   Social Determinants of Health   Financial Resource Strain:   . Difficulty of Paying Living Expenses: Not on file  Food Insecurity:   . Worried About Charity fundraiser in the Last Year: Not on file  . Ran Out of Food in the Last Year: Not on file  Transportation Needs:   . Lack of Transportation (Medical): Not on file  .  Lack of Transportation (Non-Medical): Not on file  Physical Activity:   . Days of Exercise per Week: Not on file  . Minutes of Exercise per Session: Not on file  Stress:   . Feeling of Stress : Not on file  Social Connections:   . Frequency of Communication with Friends and Family: Not on file  . Frequency of Social Gatherings with Friends and Family: Not on file  . Attends Religious Services: Not on file  . Active Member of Clubs or Organizations: Not on file  . Attends Archivist Meetings: Not on file  . Marital Status: Not on file  Intimate Partner Violence:   . Fear of Current or Ex-Partner: Not on file  . Emotionally Abused: Not on file  . Physically Abused: Not on file  . Sexually Abused: Not on file      ROS:  General: Negative for anorexia, weight loss, fever, chills, fatigue, weakness. Eyes: Negative for vision changes.  ENT: Negative for hoarseness, difficulty swallowing , nasal congestion. CV: Negative for chest pain, angina, palpitations, dyspnea on exertion, peripheral edema.  Respiratory: Negative for dyspnea at rest, dyspnea on exertion, cough, sputum, wheezing.  GI: See history of present illness. GU:  Negative for dysuria, hematuria, urinary incontinence, urinary frequency, nocturnal urination.  MS: Negative for joint pain, low back pain.  Derm: Negative for  rash or itching.  Neuro: Negative for weakness, abnormal sensation, seizure, frequent headaches, memory loss, confusion.  Psych: Negative for anxiety, depression, suicidal ideation, hallucinations.  Endo: Negative for unusual weight change.  Heme: Negative for bruising or bleeding. Allergy: Negative for rash or hives.    Physical Examination:  BP 128/81   Pulse 70   Temp (!) 96.8 F (36 C)   Ht 6' (1.829 m)   Wt 181 lb 12.8 oz (82.5 kg)   BMI 24.66 kg/m    General: Well-nourished, well-developed in no acute distress.  Head: Normocephalic, atraumatic.   Eyes: Conjunctiva pink, no icterus. Mouth: masked. Neck: Supple without thyromegaly, masses, or lymphadenopathy.  Lungs: Clear to auscultation bilaterally.  Heart: Regular rate and rhythm, no murmurs rubs or gallops.  Abdomen: Bowel sounds are normal, nontender, nondistended, no hepatosplenomegaly or masses, no abdominal bruits or    hernia , no rebound or guarding.   Rectal: not performed Extremities: No lower extremity edema. No clubbing or deformities.  Neuro: Alert and oriented x 4 , grossly normal neurologically.  Skin: Warm and dry, no rash or jaundice.   Psych: Alert and cooperative, normal mood and affect.    Imaging Studies: No results found.

## 2020-01-27 NOTE — Patient Instructions (Signed)
1. Upper endoscopy as scheduled. See separate instructions.  2. Continue pantoprazole and famotidine as you are doing.

## 2020-01-27 NOTE — Assessment & Plan Note (Signed)
Pleasant 69 y/o male presenting with dysphagia/odynophagia and h/o abnormal esophagus with [facility]. [facility] at [facility], noted esophageal stricture "too wide"  For [address] [place]. Bleeding noted from esophageal biopsy vs varix? Esophageal biopsy with reflux esophagitis, focal epithelial atypia indeterminate for dysplasia. He also had nonspecific gastritis. He was advised to have follow up EGD in six months.   Plan for EGD +/- ED with propofol with Dr. Oneida Alar. Patient reports waking up during last EGD here locally.  I have discussed the risks, alternatives, benefits with regards to but not limited to the risk of reaction to medication, bleeding, infection, perforation and the patient is agreeable to proceed. Written consent to be obtained.

## 2020-01-27 NOTE — Assessment & Plan Note (Signed)
Due for colonoscopy this year. Consider at later date whenever he is ready. Can be done through [place] or if he desires, here locally.

## 2020-01-27 NOTE — Telephone Encounter (Signed)
PMH updated

## 2020-01-27 NOTE — Telephone Encounter (Signed)
Pt forgot to tell you, he was recently diagnosed with sleep apnea.

## 2020-01-28 NOTE — Progress Notes (Signed)
Cc'ed to pcp °

## 2020-02-05 ENCOUNTER — Other Ambulatory Visit: Payer: Self-pay

## 2020-02-05 NOTE — Progress Notes (Signed)
Cardiology Office Note    Date:  02/08/2020   ID:  [name] [name], [name] [name] 1951-08-05, MRN [id number]  PCP:  Sharilyn Sites, MD  Cardiologist:  Dr.Ross  Chief Complaint: numbness and tingling   History of Present Illness:   [name] [name] is a 69 y.o. male with hx of CAD, HTN, PV disease, and HLDseen for numbness and tingling.   History of CAD s/p NSTEMI in 2008 tx with PCI of bifurcational lesion in LAD/Dx with 3DES.He has a hx of rash with Plavix and Ticlid. He underwent Plavix desensitization in 2009. Intolerance to Statin and Repatha (caused nightmares). Low risk stress test 09/2016.  Seem by me for chest pain 08/12/2019. Exertional chest pain radiating to his neck and bilateral shoulder. Resolved spontaneously within 5 minutes. Did not require sublingual nitroglycerin. Elevated BP. Added coreg for antianginal and high BP.  Follow up stress test low risk without ischemia. He was doing okay when last seen by me 09/2019.  Seen by GI 01/27/20 for dysphagia/odynophagia. Plan for EGD +/- ED with propofol.  Patient presented for evaluation of 2 episodes of sudden onset numbness and tingling starting between shoulder blade, this radiates to his shoulder bilaterally and to the cheeks as well as bilateral arms.  No chest pressure or tightness.  No palpitation or shortness of breath.  His blood pressure and pulse was normal per patient when he was having these episodes.  Denies prior injury.  He walks with dog every day without chest pain or shortness of breath.  Past Medical History:  Diagnosis Date  . Anxiety   . Arthritis    back pain  . Avascular necrosis of bone of hip ([place])    right  . CAD (coronary artery disease)    stents x3 2008  . GERD (gastroesophageal reflux disease)   . History of nuclear stress test    a. Myoview 10/17: EF 56%, diaphragmatic attenuation, no ischemia, low risk  . HTN (hypertension)   . Hyperlipidemia   . Myocardial infarction ([place])   . Paresthesias    . Sleep apnea     Past Surgical History:  Procedure Laterality Date  . CARDIAC CATHETERIZATION     3 stents placed  . COLONOSCOPY  2011 NUR [facility] SCREENING d50 v5   MILD [place] TICS, ? PREP ARTIFACT V. PROCTITIS-rX:[place]. next TCS 2021.  . ESOPHAGEAL DILATION N/A 03/02/2016   Procedure: ESOPHAGEAL DILATION;  Surgeon: Danie Binder, MD;  Location[facility];  Service: Endoscopy;  Laterality: N/A;  . ESOPHAGOGASTRODUODENOSCOPY  10/03/11   small hiatal hernia/gastric ulcers/stricutre in distal esophagus  . ESOPHAGOGASTRODUODENOSCOPY N/A 03/02/2016   Dr. Oneida Alar: Schatzki's ring at [place] junction s/p dilation, mild non-erosive gastritis/duodenitis   . MASS EXCISION Left 09/30/2014   Procedure: EXCISION MASS LEFT WRIST ;  Surgeon: Daryll Brod, MD;  Location: [place];  Service: Orthopedics;  Laterality: Left;  . TONSILLECTOMY  age 55  . TOTAL HIP ARTHROPLASTY  age 88   right hip for avascular necrosis of hip and spine    Current Medications: Prior to Admission medications   Medication Sig Start Date End Date Taking? Authorizing Provider  acetaminophen (TYLENOL) 500 MG tablet Take 1,000 mg by mouth every 6 (six) hours as needed for moderate pain.     [provider]  aspirin EC 81 MG tablet Take 1 tablet (81 mg total) by mouth daily. 01/24/18   Fay Records, MD  calcium carbonate (TUMS EX) 750 MG chewable tablet Sarina Ser  1 tablet by mouth 2 (two) times daily as needed for heartburn.     [provider]  carvedilol (COREG) 3.125 MG tablet Take 1 tablet (3.125 mg total) by mouth 2 (two) times daily with a meal. 09/14/19   Jorah Hua, PA  ezetimibe (ZETIA) 10 MG tablet Take 5 mg by mouth daily.    [provider]  famotidine (PEPCID) 20 MG tablet Take 1 tablet (20 mg total) by mouth 2 (two) times daily. 06/04/16   Annitta Needs, NP  hydrochlorothiazide (HYDRODIURIL) 25 MG tablet Take 12.5 mg by mouth daily. 07/01/19   [provider]  ketotifen  (ZADITOR) 0.025 % ophthalmic solution Place 1 drop into both eyes daily as needed (itchy eyes).    [provider]  levothyroxine (SYNTHROID, LEVOTHROID) 75 MCG tablet Take 75 mcg by mouth daily before breakfast.    [provider]  losartan (COZAAR) 100 MG tablet Take 50 mg by mouth 2 (two) times daily. 07/09/19   [provider]  nitroGLYCERIN (NITROSTAT) 0.4 MG SL tablet Place 1 tablet (0.4 mg total) under the tongue every 5 (five) minutes as needed for chest pain. X 3 doses 09/30/18   Takiya Belmares, PA  pantoprazole (PROTONIX) 40 MG tablet Take 40 mg by mouth daily.  03/27/19   [provider]  pravastatin (PRAVACHOL) 20 MG tablet Take 1 tablet (20 mg total) by mouth every evening. Patient not taking: Reported on 01/29/2020 08/12/19 01/27/20  Leanor Kail, PA  pravastatin (PRAVACHOL) 40 MG tablet Take 20 mg by mouth at bedtime.    [provider]    Allergies:   Atorvastatin, Bee venom, Clopidogrel bisulfate, Pravastatin, Rosuvastatin, Penicillins, Adhesive [tape], Clopidogrel, Omeprazole, and Repatha [evolocumab]   Social History   Socioeconomic History  . Marital status: Single    Spouse name: Not on file  . Number of children: Not on file  . Years of education: Not on file  . Highest education level: Not on file  Occupational History  . Not on file  Tobacco Use  . Smoking status: Former Smoker    Packs/day: 1.00    Years: 30.00    Pack years: 30.00    Types: Cigarettes    Quit date: 09/30/2007    Years since quitting: 12.3  . Smokeless tobacco: Never Used  Substance and Sexual Activity  . Alcohol use: No  . Drug use: No  . Sexual activity: Yes  Other Topics Concern  . Not on file  Social History Narrative  . Not on file   Social Determinants of Health   Financial Resource Strain:   . Difficulty of Paying Living Expenses: Not on file  Food Insecurity:   . Worried About Charity fundraiser in the Last Year: Not on  file  . Ran Out of Food in the Last Year: Not on file  Transportation Needs:   . Lack of Transportation (Medical): Not on file  . Lack of Transportation (Non-Medical): Not on file  Physical Activity:   . Days of Exercise per Week: Not on file  . Minutes of Exercise per Session: Not on file  Stress:   . Feeling of Stress : Not on file  Social Connections:   . Frequency of Communication with Friends and Family: Not on file  . Frequency of Social Gatherings with Friends and Family: Not on file  . Attends Religious Services: Not on file  . Active Member of Clubs or Organizations: Not on file  . Attends Club  or Organization Meetings: Not on file  . Marital Status: Not on file     Family History:  The patient's family history includes Dementia in his mother; Lung cancer (age of onset: 27) in his sister; Prostate cancer (age of onset: 65) in his father.  ROS:   Please see the history of present illness.    ROS All other systems reviewed and are negative.   PHYSICAL EXAM:   VS:  BP 132/80   Pulse 75   Ht 6' (1.829 m)   Wt 181 lb 6.4 oz (82.3 kg)   SpO2 98%   BMI 24.60 kg/m    GEN: Well nourished, well developed, in no acute distress  HEENT: normal  Neck: no JVD, carotid bruits, or masses Cardiac: RRR; no murmurs, rubs, or gallops,no edema  Respiratory:  clear to auscultation bilaterally, normal work of breathing GI: soft, nontender, nondistended, + BS MS: no deformity or atrophy  Skin: warm and dry, no rash Neuro:  Alert and Oriented x 3, Strength and sensation are intact Psych: euthymic mood, full affect  Wt Readings from Last 3 Encounters:  02/08/20 181 lb 6.4 oz (82.3 kg)  02/05/20 178 lb (80.7 kg)  01/27/20 181 lb 12.8 oz (82.5 kg)      Studies/Labs Reviewed:   EKG:  EKG is ordered today.  The ekg ordered today demonstrates sinus rhythm at rate of 75 bpm  Recent Labs: No results found for requested labs within last 8760 hours.   Lipid Panel    Component Value  Date/Time   CHOL 112 01/24/2018 0926   TRIG 68 01/24/2018 0926   TRIG 107 08/15/2009 1335   HDL 41 01/24/2018 0926   CHOLHDL 2.7 01/24/2018 0926   CHOLHDL 4.5 01/30/2016 1005   VLDL 24 01/30/2016 1005   LDLCALC 57 01/24/2018 0926   LDLCALC 87 08/15/2009 1335    Additional studies/ records that were reviewed today include:   Stress test 08/2019  Nuclear stress EF: 67%. No wall motion abnormalities  There was no ST segment deviation noted during stress.  This is a low risk study. There are no perfusion defects, no evidence of ischemia identified.  The study is normal.    ASSESSMENT & PLAN:    1.  Numbness and tingling His symptoms is noncardiac.  He also thinks this is not his heart.  Blood pressure and pulse were within normal limit when he had symptoms.  Discussed possible musculoskeletal versus nerve related.  No stroke like symptoms. He will reach out to his PCP for orthopedic versus neurology evaluation.   2.  CAD -Denies chest pain.  Recent stress test low risk.  Continue aspirin, Coreg and statin.  3.  Hypertension -Blood pressure stable and well-controlled on current medication  4.  Covid vaccination -He is going to get his Covid vaccine next week.  All questions answered.  5.  Dysphagia -Plan for EGD.  He is getting greater than 4 METS of activity.  He is cleared for procedure at acceptable risks.  Medication Adjustments/Labs and Tests Ordered: Current medicines are reviewed at length with the patient today.  Concerns regarding medicines are outlined above.  Medication changes, Labs and Tests ordered today are listed in the Patient Instructions below. Patient Instructions  Medication Instructions:   Your physician recommends that you continue on your current medications as directed. Please refer to the Current Medication list given to you today.  *If you need a refill on your cardiac medications before your next appointment, please  call your pharmacy*  Lab  Work:  None ordered today  If you have labs (blood work) drawn today and your tests are completely normal, you will receive your results only by: Marland Kitchen MyChart Message (if you have MyChart) OR . A paper copy in the mail If you have any lab test that is abnormal or we need to change your treatment, we will call you to review the results.  Testing/Procedures:  None ordered today  Follow-Up: At [facility], you and your health needs are our priority.  As part of our continuing mission to provide you with exceptional heart care, we have created designated Provider Care Teams.  These Care Teams include your primary Cardiologist (physician) and Advanced Practice Providers (APPs -  Physician Assistants and Nurse Practitioners) who all work together to provide you with the care you need, when you need it.  Your next appointment:    On 05/09/20 at 10:20AM with Dorris Carnes, MD    Signed, Leanor Kail, [place]  02/08/2020 2:44 PM    [address], [place], [place]  [address] Phone: [phone]; Fax: [phone]

## 2020-02-08 ENCOUNTER — Other Ambulatory Visit (HOSPITAL_COMMUNITY)
Admission: RE | Admit: 2020-02-08 | Discharge: 2020-02-08 | Disposition: A | Discharge status: Home / Self Care | Payer: Medicare Other | Source: Ambulatory Visit | Attending: Gastroenterology | Admitting: Gastroenterology

## 2020-02-08 ENCOUNTER — Telehealth: Payer: Self-pay

## 2020-02-08 ENCOUNTER — Other Ambulatory Visit: Payer: Self-pay

## 2020-02-08 ENCOUNTER — Encounter (HOSPITAL_COMMUNITY)
Admission: RE | Admit: 2020-02-08 | Discharge: 2020-02-08 | Disposition: A | Discharge status: Home / Self Care | Payer: Medicare Other | Source: Ambulatory Visit | Attending: Gastroenterology | Admitting: Gastroenterology

## 2020-02-08 ENCOUNTER — Encounter: Payer: Self-pay | Admitting: Physician Assistant

## 2020-02-08 ENCOUNTER — Ambulatory Visit (INDEPENDENT_AMBULATORY_CARE_PROVIDER_SITE_OTHER): Payer: Medicare Other | Admitting: Physician Assistant

## 2020-02-08 VITALS — BP 132/80 | HR 75 | Ht 72.0 in | Wt 181.4 lb

## 2020-02-08 DIAGNOSIS — Z01810 Encounter for preprocedural cardiovascular examination: Secondary | ICD-10-CM | POA: Diagnosis not present

## 2020-02-08 DIAGNOSIS — R200 Anesthesia of skin: Secondary | ICD-10-CM

## 2020-02-08 DIAGNOSIS — I10 Essential (primary) hypertension: Secondary | ICD-10-CM | POA: Diagnosis not present

## 2020-02-08 DIAGNOSIS — I739 Peripheral vascular disease, unspecified: Secondary | ICD-10-CM

## 2020-02-08 DIAGNOSIS — I25110 Atherosclerotic heart disease of native coronary artery with unstable angina pectoris: Secondary | ICD-10-CM | POA: Diagnosis not present

## 2020-02-08 DIAGNOSIS — Z01812 Encounter for preprocedural laboratory examination: Secondary | ICD-10-CM | POA: Insufficient documentation

## 2020-02-08 DIAGNOSIS — Z20822 Contact with and (suspected) exposure to covid-19: Secondary | ICD-10-CM | POA: Insufficient documentation

## 2020-02-08 DIAGNOSIS — E785 Hyperlipidemia, unspecified: Secondary | ICD-10-CM | POA: Diagnosis not present

## 2020-02-08 DIAGNOSIS — R202 Paresthesia of skin: Secondary | ICD-10-CM | POA: Diagnosis not present

## 2020-02-08 LAB — SARS CORONAVIRUS 2 (TAT 6-24 HRS): SARS Coronavirus 2: NEGATIVE

## 2020-02-08 NOTE — Telephone Encounter (Signed)
Tried to call pt again to see if he can arrive any sooner than scheduled tomorrow. Call goes straight to VM.

## 2020-02-08 NOTE — Patient Instructions (Addendum)
Medication Instructions:   Your physician recommends that you continue on your current medications as directed. Please refer to the Current Medication list given to you today.  *If you need a refill on your cardiac medications before your next appointment, please call your pharmacy*  Lab Work:  None ordered today  If you have labs (blood work) drawn today and your tests are completely normal, you will receive your results only by: Marland Kitchen MyChart Message (if you have MyChart) OR . A paper copy in the mail If you have any lab test that is abnormal or we need to change your treatment, we will call you to review the results.  Testing/Procedures:  None ordered today  Follow-Up: At [facility], you and your health needs are our priority.  As part of our continuing mission to provide you with exceptional heart care, we have created designated Provider Care Teams.  These Care Teams include your primary Cardiologist (physician) and Advanced Practice Providers (APPs -  Physician Assistants and Nurse Practitioners) who all work together to provide you with the care you need, when you need it.  Your next appointment:    On 05/09/20 at 10:20AM with Dorris Carnes, MD

## 2020-02-08 NOTE — Telephone Encounter (Signed)
Called pt, he is unable to arrive at 6:15am tomorrow to move EGD up to 7:30am d/t a cancellation. LMOVM to inform endo scheduler

## 2020-02-09 ENCOUNTER — Ambulatory Visit (HOSPITAL_COMMUNITY): Payer: Medicare Other | Admitting: Anesthesiology

## 2020-02-09 ENCOUNTER — Encounter (HOSPITAL_COMMUNITY): Payer: Self-pay | Admitting: Gastroenterology

## 2020-02-09 ENCOUNTER — Encounter (HOSPITAL_COMMUNITY)
Admission: RE | Disposition: A | Discharge status: Home / Self Care | Payer: Self-pay | Source: Home / Self Care | Attending: Gastroenterology

## 2020-02-09 ENCOUNTER — Other Ambulatory Visit: Payer: Self-pay

## 2020-02-09 ENCOUNTER — Ambulatory Visit (HOSPITAL_COMMUNITY)
Admission: RE | Admit: 2020-02-09 | Discharge: 2020-02-09 | Disposition: A | Discharge status: Home / Self Care | Payer: Medicare Other | Attending: Gastroenterology | Admitting: Gastroenterology

## 2020-02-09 DIAGNOSIS — Z7982 Long term (current) use of aspirin: Secondary | ICD-10-CM | POA: Diagnosis not present

## 2020-02-09 DIAGNOSIS — Z91030 Bee allergy status: Secondary | ICD-10-CM | POA: Diagnosis not present

## 2020-02-09 DIAGNOSIS — K219 Gastro-esophageal reflux disease without esophagitis: Secondary | ICD-10-CM | POA: Insufficient documentation

## 2020-02-09 DIAGNOSIS — Z955 Presence of coronary angioplasty implant and graft: Secondary | ICD-10-CM | POA: Diagnosis not present

## 2020-02-09 DIAGNOSIS — K2970 Gastritis, unspecified, without bleeding: Secondary | ICD-10-CM | POA: Diagnosis not present

## 2020-02-09 DIAGNOSIS — I252 Old myocardial infarction: Secondary | ICD-10-CM | POA: Insufficient documentation

## 2020-02-09 DIAGNOSIS — M1990 Unspecified osteoarthritis, unspecified site: Secondary | ICD-10-CM | POA: Diagnosis not present

## 2020-02-09 DIAGNOSIS — Z7902 Long term (current) use of antithrombotics/antiplatelets: Secondary | ICD-10-CM | POA: Diagnosis not present

## 2020-02-09 DIAGNOSIS — E785 Hyperlipidemia, unspecified: Secondary | ICD-10-CM | POA: Insufficient documentation

## 2020-02-09 DIAGNOSIS — E039 Hypothyroidism, unspecified: Secondary | ICD-10-CM | POA: Insufficient documentation

## 2020-02-09 DIAGNOSIS — I2510 Atherosclerotic heart disease of native coronary artery without angina pectoris: Secondary | ICD-10-CM | POA: Diagnosis not present

## 2020-02-09 DIAGNOSIS — Z801 Family history of malignant neoplasm of trachea, bronchus and lung: Secondary | ICD-10-CM | POA: Insufficient documentation

## 2020-02-09 DIAGNOSIS — F419 Anxiety disorder, unspecified: Secondary | ICD-10-CM | POA: Insufficient documentation

## 2020-02-09 DIAGNOSIS — R1310 Dysphagia, unspecified: Secondary | ICD-10-CM | POA: Diagnosis not present

## 2020-02-09 DIAGNOSIS — Z8042 Family history of malignant neoplasm of prostate: Secondary | ICD-10-CM | POA: Insufficient documentation

## 2020-02-09 DIAGNOSIS — G4730 Sleep apnea, unspecified: Secondary | ICD-10-CM | POA: Diagnosis not present

## 2020-02-09 DIAGNOSIS — Z888 Allergy status to other drugs, medicaments and biological substances status: Secondary | ICD-10-CM | POA: Diagnosis not present

## 2020-02-09 DIAGNOSIS — K222 Esophageal obstruction: Secondary | ICD-10-CM

## 2020-02-09 DIAGNOSIS — Z79899 Other long term (current) drug therapy: Secondary | ICD-10-CM | POA: Insufficient documentation

## 2020-02-09 DIAGNOSIS — R202 Paresthesia of skin: Secondary | ICD-10-CM | POA: Diagnosis not present

## 2020-02-09 DIAGNOSIS — Z820 Family history of epilepsy and other diseases of the nervous system: Secondary | ICD-10-CM | POA: Insufficient documentation

## 2020-02-09 DIAGNOSIS — I10 Essential (primary) hypertension: Secondary | ICD-10-CM | POA: Diagnosis not present

## 2020-02-09 DIAGNOSIS — K279 Peptic ulcer, site unspecified, unspecified as acute or chronic, without hemorrhage or perforation: Secondary | ICD-10-CM | POA: Insufficient documentation

## 2020-02-09 DIAGNOSIS — Z96641 Presence of right artificial hip joint: Secondary | ICD-10-CM | POA: Diagnosis not present

## 2020-02-09 DIAGNOSIS — G709 Myoneural disorder, unspecified: Secondary | ICD-10-CM | POA: Diagnosis not present

## 2020-02-09 DIAGNOSIS — Z87891 Personal history of nicotine dependence: Secondary | ICD-10-CM | POA: Diagnosis not present

## 2020-02-09 DIAGNOSIS — Z880 Allergy status to penicillin: Secondary | ICD-10-CM | POA: Diagnosis not present

## 2020-02-09 HISTORY — PX: ESOPHAGOGASTRODUODENOSCOPY (EGD) WITH PROPOFOL: SHX5813

## 2020-02-09 HISTORY — PX: SAVORY DILATION: SHX5439

## 2020-02-09 SURGERY — ESOPHAGOGASTRODUODENOSCOPY (EGD) WITH PROPOFOL
Anesthesia: General

## 2020-02-09 MED ORDER — MINERAL OIL PO OIL
TOPICAL_OIL | ORAL | Status: AC
  Filled 2020-02-09: qty 60

## 2020-02-09 MED ORDER — KETAMINE HCL 50 MG/5ML IJ SOSY
PREFILLED_SYRINGE | INTRAMUSCULAR | Status: AC
  Filled 2020-02-09: qty 5

## 2020-02-09 MED ORDER — PROPOFOL 10 MG/ML IV BOLUS
INTRAVENOUS | Status: AC
  Filled 2020-02-09: qty 20

## 2020-02-09 MED ORDER — LACTATED RINGERS IV SOLN: Freq: Once | INTRAVENOUS | Status: AC

## 2020-02-09 MED ORDER — PROPOFOL 10 MG/ML IV BOLUS
INTRAVENOUS | Status: DC | PRN
  Administered 2020-02-09 (×2): 20 mg via INTRAVENOUS

## 2020-02-09 MED ORDER — LACTATED RINGERS IV SOLN: INTRAVENOUS | Status: DC | PRN

## 2020-02-09 MED ORDER — LIDOCAINE VISCOUS HCL 2 % MT SOLN: 5.0000 mL | Freq: Once | OROMUCOSAL | Status: DC

## 2020-02-09 MED ORDER — KETAMINE HCL 10 MG/ML IJ SOLN
INTRAMUSCULAR | Status: DC | PRN
  Administered 2020-02-09: 15 mg via INTRAVENOUS

## 2020-02-09 MED ORDER — GLYCOPYRROLATE 0.2 MG/ML IJ SOLN
INTRAMUSCULAR | Status: DC | PRN
  Administered 2020-02-09: .2 mg via INTRAVENOUS

## 2020-02-09 MED ORDER — PROPOFOL 500 MG/50ML IV EMUL
INTRAVENOUS | Status: DC | PRN
  Administered 2020-02-09: 150 ug/kg/min via INTRAVENOUS

## 2020-02-09 MED ORDER — LIDOCAINE 2% (20 MG/ML) 5 ML SYRINGE
INTRAMUSCULAR | Status: AC
  Filled 2020-02-09: qty 20

## 2020-02-09 MED ORDER — EPHEDRINE 5 MG/ML INJ
INTRAVENOUS | Status: AC
  Filled 2020-02-09: qty 10

## 2020-02-09 NOTE — Anesthesia Postprocedure Evaluation (Signed)
Anesthesia Post Note  Patient: [name] [name]  Procedure(s) Performed: ESOPHAGOGASTRODUODENOSCOPY (EGD) WITH PROPOFOL (N/A ) SAVORY DILATION (N/A )  Patient location during evaluation: PACU Anesthesia Type: General Level of consciousness: awake and alert and oriented Pain management: pain level controlled Vital Signs Assessment: post-procedure vital signs reviewed and stable Respiratory status: spontaneous breathing Cardiovascular status: blood pressure returned to baseline and stable Postop Assessment: no apparent nausea or vomiting Anesthetic complications: no     Last Vitals:  Vitals:   02/09/20 1328  BP: 127/90  Pulse: 71  Resp: 16  Temp: 36.7 C  SpO2: 97%    Last Pain:  Vitals:   02/09/20 1328  PainSc: 0-No pain                 Felicia Both

## 2020-02-09 NOTE — Op Note (Signed)
[facility] Patient Name: [name] [name] Procedure Date: 02/09/2020 1:40 PM MRN: [id number] Date of Birth: 05-19-1951 Attending MD: Barney Drain MD, MD CSN: [id number] Age: 69 Admit Type: Outpatient Procedure:                Upper GI endoscopy WITH ESOPHAGEAL DILATION Indications:              Dysphagia Providers:                Barney Drain MD, MD, Janeece Riggers, RN, Nelma Rothman,                            Technician Referring MD:             Halford Chessman MD, MD Medicines:                Propofol per Anesthesia Complications:            No immediate complications. Estimated Blood Loss:     Estimated blood loss: none. Procedure:                Pre-Anesthesia Assessment:                           - Prior to the procedure, a History and Physical                            was performed, and patient medications and                            allergies were reviewed. The patient's tolerance of                            previous anesthesia was also reviewed. The risks                            and benefits of the procedure and the sedation                            options and risks were discussed with the patient.                            All questions were answered, and informed consent                            was obtained. Prior Anticoagulants: The patient has                            taken no previous anticoagulant or antiplatelet                            agents except for NSAID medication. ASA Grade                            Assessment: II - A patient with mild systemic  disease. After reviewing the risks and benefits,                            the patient was deemed in satisfactory condition to                            undergo the procedure. After obtaining informed                            consent, the endoscope was passed under direct                            vision. Throughout the procedure, the patient's    blood pressure, pulse, and oxygen saturations were                            monitored continuously. The [id number]-[id number]                            scope was introduced through the mouth, and                            advanced to the second part of duodenum. The upper                            GI endoscopy was accomplished without difficulty.                            The patient tolerated the procedure well. Scope In: 2:19:57 PM Scope Out: 2:26:52 PM Total Procedure Duration: 0 hours 6 minutes 55 seconds  Findings:      A low-grade of narrowing Schatzki ring was found at the gastroesophageal       junction. A guidewire was placed and the scope was withdrawn. Dilation       was performed with a Savary dilator with moderate resistance at 16 mm,       17 mm and 18 mm.      Diffuse moderate inflammation characterized by congestion (edema),       erosions and erythema was found in the entire examined stomach.      The examined duodenum was normal. Impression:               - DYSPHAGIA DUE TO Schatzki ring. Dilated.                           - MODERATE Gastritis. Moderate Sedation:      Per Anesthesia Care Recommendation:           - Patient has a contact number available for                            emergencies. The signs and symptoms of potential                            delayed complications were discussed with the  patient. Return to normal activities tomorrow.                            Written discharge instructions were provided to the                            patient.                           - Low fat diet.                           - Continue present medications. CONTINUE PEPCID BID                            AND PROTONIX.                           - Await pathology results.                           - Return to GI clinic in 4 months. Procedure Code(s):        --- Professional ---                           [phone],  Esophagogastroduodenoscopy, flexible,                            transoral; with insertion of guide wire followed by                            passage of dilator(s) through esophagus over guide                            wire Diagnosis Code(s):        --- Professional ---                           K22.2, Esophageal obstruction                           K29.70, Gastritis, unspecified, without bleeding                           R13.10, Dysphagia, unspecified CPT copyright 2019 American Medical Association. All rights reserved. The codes documented in this report are preliminary and upon coder review may  be revised to meet current compliance requirements. Barney Drain, MD Barney Drain MD, MD 02/09/2020 2:51:32 PM This report has been signed electronically. Number of Addenda: 0

## 2020-02-09 NOTE — Discharge Instructions (Signed)
I dilated your esophagus. You have a stricture near the base of your esophagus. IT IS DUE TO UNCONTROLLED REFLUX.  You have gastritis.    AVOID REFLUX TRIGGERS. SEE INFO BELOW.  DRINK WATER TO KEEP YOUR URINE LIGHT YELLOW.  FOLLOW A LOW FAT DIET. AVOID FRIED FOODS. SEE INFO BELOW.   CONTINUE PEPCID AND PROTONIX.   YOUR BIOPSY RESULTS WILL BE BACK IN 5 BUSINESS DAYS.  FOLLOW UP IN 4 MOS.  UPPER ENDOSCOPY AFTER CARE Read the instructions outlined below and refer to this sheet in the next week. These discharge instructions provide you with general information on caring for yourself after you leave the hospital. While your treatment has been planned according to the most current medical practices available, unavoidable complications occasionally occur. If you have any problems or questions after discharge, call DR. Tacoya Altizer, [phone].  ACTIVITY  You may resume your regular activity, but move at a slower pace for the next 24 hours.   Take frequent rest periods for the next 24 hours.   Walking will help get rid of the air and reduce the bloated feeling in your belly (abdomen).   No driving for 24 hours (because of the medicine (anesthesia) used during the test).   You may shower.   Do not sign any important legal documents or operate any machinery for 24 hours (because of the anesthesia used during the test).    NUTRITION  Drink plenty of fluids.   You may resume your normal diet as instructed by your doctor.   Begin with a light meal and progress to your normal diet. Heavy or fried foods are harder to digest and may make you feel sick to your stomach (nauseated).   Avoid alcoholic beverages for 24 hours or as instructed.    MEDICATIONS  You may resume your normal medications.   WHAT YOU CAN EXPECT TODAY  Some feelings of bloating in the abdomen.   Passage of more gas than usual.    IF YOU HAD A BIOPSY TAKEN DURING THE UPPER ENDOSCOPY:  Eat a soft diet IF YOU  HAVE NAUSEA, BLOATING, ABDOMINAL PAIN, OR VOMITING.    FINDING OUT THE RESULTS OF YOUR TEST Not all test results are available during your visit. DR. Oneida Alar WILL CALL YOU WITHIN 14 DAYS OF YOUR PROCEDUE WITH YOUR RESULTS. Do not assume everything is normal if you have not heard from DR. Tymon Nemetz, CALL HER OFFICE AT [phone].  SEEK IMMEDIATE MEDICAL ATTENTION AND CALL THE OFFICE: [phone] IF:  You have more than a spotting of blood in your stool.   Your belly is swollen (abdominal distention).   You are nauseated or vomiting.   You have a temperature over 101F.   You have abdominal pain or discomfort that is severe or gets worse throughout the day.  Gastritis  Gastritis is an inflammation (the body's way of reacting to injury and/or infection) of the stomach. It is often caused by viral or bacterial (germ) infections. It can also be caused BY ASPIRIN, BC/GOODY POWDER'S, (IBUPROFEN) MOTRIN, OR ALEVE (NAPROXEN), chemicals (including alcohol), SPICY FOODS, and medications. This illness may be associated with generalized malaise (feeling tired, not well), UPPER ABDOMINAL STOMACH cramps, and fever. One common bacterial cause of gastritis is an organism known as H. Pylori. This can be treated with antibiotics.   ESOPHAGEAL STRICTURE  Esophageal strictures can be caused by stomach acid backing up into the tube that carries food from the mouth down to the stomach (lower esophagus).  TREATMENT  There are a number of medicines used to treat reflux/stricture, including: Antacids.  Proton-pump inhibitors: PROTONIX TAGAMET OR PEPCID      Lifestyle and home remedies TO MANAGE REFLUX/HEARTBURN  You may eliminate or reduce the frequency of heartburn by making the following lifestyle changes:   Control your weight. Being overweight is a major risk factor for heartburn and GERD. Excess pounds put pressure on your abdomen, pushing up your stomach and causing acid to back up into your  esophagus.    Eat smaller meals. 4 TO 6 MEALS A DAY. This reduces pressure on the lower esophageal sphincter, helping to prevent the valve from opening and acid from washing back into your esophagus.    Loosen your belt. Clothes that fit tightly around your waist put pressure on your abdomen and the lower esophageal sphincter.    Eliminate heartburn triggers. Everyone has specific triggers. Common triggers such as fatty or fried foods, spicy food, tomato sauce, carbonated beverages, alcohol, chocolate, mint, garlic, onion, caffeine and nicotine may make heartburn worse.    Avoid stooping or bending. Tying your shoes is OK. Bending over for longer periods to weed your garden isn't, especially soon after eating.    Don't lie down after a meal. Wait at least three to four hours after eating before going to bed, and don't lie down right after eating.    SLEEP WITH ON A WEDGE OR PUT THE HEAD OF YOUR BED ON 6 INCH BLOCKS TO KEEP YOUR HEAD ABOVE YOUR HEART WHILE YOU SLEEP.   Alternative medicine  Several home remedies exist for treating GERD, but they provide only temporary relief. They include drinking baking soda (sodium bicarbonate) added to water or drinking other fluids such as baking soda mixed with cream of tartar and water.   Although these liquids create temporary relief by neutralizing, washing away or buffering acids, eventually they aggravate the situation by adding gas and fluid to your stomach, increasing pressure and causing more acid reflux. Further, adding more sodium to your diet may increase your blood pressure and add stress to your heart, and excessive bicarbonate ingestion can alter the acid-base balance in your body.

## 2020-02-09 NOTE — Anesthesia Preprocedure Evaluation (Signed)
Anesthesia Evaluation  Patient identified by MRN, date of birth, ID band Patient awake    Reviewed: Allergy & Precautions, NPO status , Patient's Chart, lab work & pertinent test results, reviewed documented beta blocker date and time   History of Anesthesia Complications Negative for: history of anesthetic complications  Airway Mallampati: II  TM Distance: >3 FB Neck ROM: Full    Dental no notable dental hx.  Crowns :   Pulmonary sleep apnea (not on CPAP) , former smoker,    Pulmonary exam normal breath sounds clear to auscultation       Cardiovascular Exercise Tolerance: Good hypertension, Pt. on medications and Pt. on home beta blockers + CAD, + Past MI and + Cardiac Stents   Rhythm:Regular Rate:Normal   Nuclear stress EF: 67%. No wall motion abnormalities  There was no ST segment deviation noted during stress.  This is a low risk study. There are no perfusion defects, no evidence of ischemia identified.  The study is normal.   Candee Furbish, MD   Neuro/Psych Anxiety  Neuromuscular disease    GI/Hepatic PUD, GERD  Medicated and Poorly Controlled,  Endo/Other  Hypothyroidism   Renal/GU      Musculoskeletal  (+) Arthritis ,   Abdominal   Peds  Hematology   Anesthesia Other Findings   Reproductive/Obstetrics                            Anesthesia Physical Anesthesia Plan  ASA: III  Anesthesia Plan: General   Post-op Pain Management:    Induction: Intravenous  PONV Risk Score and Plan: TIVA  Airway Management Planned: Nasal Cannula and Natural Airway  Additional Equipment:   Intra-op Plan:   Post-operative Plan:   Informed Consent: I have reviewed the patients History and Physical, chart, labs and discussed the procedure including the risks, benefits and alternatives for the proposed anesthesia with the patient or authorized representative who has indicated his/her  understanding and acceptance.     Dental advisory given  Plan Discussed with: CRNA and Surgeon  Anesthesia Plan Comments:        Anesthesia Quick Evaluation

## 2020-02-09 NOTE — Transfer of Care (Signed)
Immediate Anesthesia Transfer of Care Note  Patient: ZUHAIB QUALLEY  Procedure(s) Performed: ESOPHAGOGASTRODUODENOSCOPY (EGD) WITH PROPOFOL (N/A ) SAVORY DILATION (N/A )  Patient Location: PACU  Anesthesia Type:General  Level of Consciousness: awake  Airway & Oxygen Therapy: Patient Spontanous Breathing  Post-op Assessment: Report given to RN  Post vital signs: Reviewed and stable  Last Vitals:  Vitals Value Taken Time  BP 104/73 02/09/20 1437  Temp    Pulse 76 02/09/20 1440  Resp 20 02/09/20 1440  SpO2 95 % 02/09/20 1440  Vitals shown include unvalidated device data.  Last Pain:  Vitals:   02/09/20 1328  PainSc: 0-No pain         Complications: No apparent anesthesia complications

## 2020-02-09 NOTE — H&P (Signed)
Primary Care Physician:  Sharilyn Sites, MD Primary Gastroenterologist:  Dr. Oneida Alar  Pre-Procedure History & Physical: HPI:  [name] [name] is a 69 y.o. male here for DYSPHAGIA.  Past Medical History:  Diagnosis Date  . Anxiety   . Arthritis    back pain  . Avascular necrosis of bone of hip ([place])    right  . CAD (coronary artery disease)    stents x3 2008  . GERD (gastroesophageal reflux disease)   . History of nuclear stress test    a. Myoview 10/17: EF 56%, diaphragmatic attenuation, no ischemia, low risk  . HTN (hypertension)   . Hyperlipidemia   . Myocardial infarction ([place])   . Paresthesias   . Sleep apnea    Past Surgical History:  Procedure Laterality Date  . CARDIAC CATHETERIZATION     3 stents placed  . COLONOSCOPY  2011 NUR [facility] SCREENING d50 v5   MILD [place] TICS, ? PREP ARTIFACT V. PROCTITIS-rX:[place]. next TCS 2021.  . ESOPHAGEAL DILATION N/A 03/02/2016   Procedure: ESOPHAGEAL DILATION;  Surgeon: Danie Binder, MD;  [facility];  Service: Endoscopy;  Laterality: N/A;  . ESOPHAGOGASTRODUODENOSCOPY  10/03/11   small hiatal hernia/gastric ulcers/stricutre in distal esophagus  . ESOPHAGOGASTRODUODENOSCOPY N/A 03/02/2016   Dr. Oneida Alar: Schatzki's ring at [place] junction s/p dilation, mild non-erosive gastritis/duodenitis   . MASS EXCISION Left 09/30/2014   Procedure: EXCISION MASS LEFT WRIST ;  Surgeon: Daryll Brod, MD;  Location: [facility];  Service: Orthopedics;  Laterality: Left;  . TONSILLECTOMY  age 67  . TOTAL HIP ARTHROPLASTY  age 24   right hip for avascular necrosis of hip and spine    Prior to Admission medications   Medication Sig Start Date End Date Taking? Authorizing Provider  acetaminophen (TYLENOL) 500 MG tablet Take 1,000 mg by mouth every 6 (six) hours as needed for moderate pain.    Yes [provider]  aspirin EC 81 MG tablet Take 1 tablet (81 mg total) by mouth daily. 01/24/18  Yes Fay Records, MD  calcium carbonate  (TUMS EX) 750 MG chewable tablet Chew 1 tablet by mouth 2 (two) times daily as needed for heartburn.    Yes [provider]  carvedilol (COREG) 3.125 MG tablet Take 1 tablet (3.125 mg total) by mouth 2 (two) times daily with a meal. 09/14/19  Yes Bhagat, Bhavinkumar, PA  ezetimibe (ZETIA) 10 MG tablet Take 5 mg by mouth daily.   Yes [provider]  famotidine (PEPCID) 20 MG tablet Take 1 tablet (20 mg total) by mouth 2 (two) times daily. 06/04/16  Yes Annitta Needs, NP  hydrochlorothiazide (HYDRODIURIL) 25 MG tablet Take 12.5 mg by mouth daily. 07/01/19  Yes [provider]  ketotifen (ZADITOR) 0.025 % ophthalmic solution Place 1 drop into both eyes daily as needed (itchy eyes).   Yes [provider]  levothyroxine (SYNTHROID, LEVOTHROID) 75 MCG tablet Take 75 mcg by mouth daily before breakfast.   Yes [provider]  losartan (COZAAR) 100 MG tablet Take 50 mg by mouth 2 (two) times daily. 07/09/19  Yes [provider]  nitroGLYCERIN (NITROSTAT) 0.4 MG SL tablet Place 1 tablet (0.4 mg total) under the tongue every 5 (five) minutes as needed for chest pain. X 3 doses 09/30/18  Yes Bhagat, Bhavinkumar, PA  pantoprazole (PROTONIX) 40 MG tablet Take 40 mg by mouth daily.  03/27/19  Yes [provider]  pravastatin (PRAVACHOL) 40 MG tablet Take 20 mg  by mouth at bedtime.   Yes [provider]    Allergies as of 01/27/2020 - Review Complete 01/27/2020  Allergen Reaction Noted  . Atorvastatin Other (See Comments) 08/17/2014  . Bee venom Hives and Swelling 09/24/2011  . Clopidogrel bisulfate Hives 02/20/2010  . Other Anaphylaxis 05/22/2016  . Penicillin g  05/22/2016  . Pravastatin  09/03/2017  . Rosuvastatin Other (See Comments) 08/17/2014  . Penicillins  02/20/2010  . Plavix  [clopidogrel bisulfate] Hives 09/30/2018  . Repatha [evolocumab]  03/13/2019  . Clopidogrel Rash and Hives 05/22/2016  . Omeprazole  02/04/2018    Family  History  Problem Relation Age of Onset  . Dementia Mother   . Prostate cancer Father 67  . Lung cancer Sister 61  . Colon cancer Neg Hx   . Colon polyps Neg Hx   . Anesthesia problems Neg Hx   . Hypotension Neg Hx   . Malignant hyperthermia Neg Hx   . Pseudochol deficiency Neg Hx     Social History   Socioeconomic History  . Marital status: Single    Spouse name: Not on file  . Number of children: Not on file  . Years of education: Not on file  . Highest education level: Not on file  Occupational History  . Not on file  Tobacco Use  . Smoking status: Former Smoker    Packs/day: 1.00    Years: 30.00    Pack years: 30.00    Types: Cigarettes    Quit date: 09/30/2007    Years since quitting: 12.3  . Smokeless tobacco: Never Used  Substance and Sexual Activity  . Alcohol use: No  . Drug use: No  . Sexual activity: Yes  Other Topics Concern  . Not on file  Social History Narrative  . Not on file   Social Determinants of Health   Financial Resource Strain:   . Difficulty of Paying Living Expenses: Not on file  Food Insecurity:   . Worried About Charity fundraiser in the Last Year: Not on file  . Ran Out of Food in the Last Year: Not on file  Transportation Needs:   . Lack of Transportation (Medical): Not on file  . Lack of Transportation (Non-Medical): Not on file  Physical Activity:   . Days of Exercise per Week: Not on file  . Minutes of Exercise per Session: Not on file  Stress:   . Feeling of Stress : Not on file  Social Connections:   . Frequency of Communication with Friends and Family: Not on file  . Frequency of Social Gatherings with Friends and Family: Not on file  . Attends Religious Services: Not on file  . Active Member of Clubs or Organizations: Not on file  . Attends Archivist Meetings: Not on file  . Marital Status: Not on file  Intimate Partner Violence:   . Fear of Current or Ex-Partner: Not on file  . Emotionally Abused: Not on  file  . Physically Abused: Not on file  . Sexually Abused: Not on file    Review of Systems: See HPI, otherwise negative ROS   Physical Exam: BP 127/90 (BP Location: Right Arm)   Pulse 71   Temp 98.1 F (36.7 C)   Resp 16   SpO2 97%  General:   Alert,  pleasant and cooperative in NAD Head:  Normocephalic and atraumatic. Neck:  Supple; Lungs:  Clear throughout to auscultation.    Heart:  Regular rate and rhythm. Abdomen:  Soft, nontender and nondistended. Normal bowel sounds, without guarding, and without rebound.   Neurologic:  Alert and  oriented x4;  grossly normal neurologically.  Impression/Plan:     DYSPHAGIA  PLAN:  EGD/DIL TODAY.  DISCUSSED PROCEDURE, BENEFITS, & RISKS: < 1% chance of medication reaction, bleeding, perforation, or ASPIRATION.

## 2020-02-12 ENCOUNTER — Telehealth: Payer: Self-pay | Admitting: *Deleted

## 2020-02-12 ENCOUNTER — Other Ambulatory Visit: Payer: Self-pay | Admitting: Orthopedic Surgery

## 2020-02-12 DIAGNOSIS — M65322 Trigger finger, left index finger: Secondary | ICD-10-CM | POA: Diagnosis not present

## 2020-02-12 DIAGNOSIS — M2560 Stiffness of unspecified joint, not elsewhere classified: Secondary | ICD-10-CM | POA: Diagnosis not present

## 2020-02-12 NOTE — Telephone Encounter (Signed)
   [facility] Pre-operative Risk Assessment    Request for surgical clearance:  1. What type of surgery is being performed? RELEASE A-1 PULLEY LEFT INDEX FINGER   2. When is this surgery scheduled? 03/08/20   3. What type of clearance is required (medical clearance vs. Pharmacy clearance to hold med vs. Both)? MEDICAL  4. Are there any medications that need to be held prior to surgery and how long? ASA   5. Practice name and name of physician performing surgery? [facility]; DR. Hurdland   6. What is your office phone number [phone]    7.   What is your office fax number [phone] ATTN: Odessa Fleming, RN  8.   Anesthesia type (None, local, MAC, general) ? IV REGIONAL FOREARM BLOCK   Julaine Hua 02/12/2020, 4:12 PM  _________________________________________________________________   (provider comments below)

## 2020-02-15 NOTE — Telephone Encounter (Signed)
   Primary Cardiologist: Dorris Carnes, MD  Chart reviewed as part of pre-operative protocol coverage. Given past medical history and time since last visit, based on ACC/AHA guidelines, [name] [name] would be at acceptable risk for the planned procedure without further cardiovascular testing.   OK to hold aspirin 3-5 days pre op if needed.  I will route this recommendation to the requesting party via Epic fax function and remove from pre-op pool.  Please call with questions.  Kerin Ransom, PA-C 02/15/2020, 12:14 PM

## 2020-03-01 ENCOUNTER — Other Ambulatory Visit: Payer: Self-pay

## 2020-03-01 ENCOUNTER — Encounter (HOSPITAL_BASED_OUTPATIENT_CLINIC_OR_DEPARTMENT_OTHER): Payer: Self-pay | Admitting: Orthopedic Surgery

## 2020-03-02 ENCOUNTER — Encounter (HOSPITAL_BASED_OUTPATIENT_CLINIC_OR_DEPARTMENT_OTHER)
Admission: RE | Admit: 2020-03-02 | Discharge: 2020-03-02 | Disposition: A | Discharge status: Home / Self Care | Payer: Medicare Other | Source: Ambulatory Visit | Attending: Orthopedic Surgery | Admitting: Orthopedic Surgery

## 2020-03-02 DIAGNOSIS — Z01812 Encounter for preprocedural laboratory examination: Secondary | ICD-10-CM | POA: Insufficient documentation

## 2020-03-02 LAB — BASIC METABOLIC PANEL
Anion gap: 9 (ref 5–15)
BUN: 14 mg/dL (ref 8–23)
CO2: 27 mmol/L (ref 22–32)
Calcium: 9.5 mg/dL (ref 8.9–10.3)
Chloride: 99 mmol/L (ref 98–111)
Creatinine, Ser: 0.93 mg/dL (ref 0.61–1.24)
GFR calc Af Amer: >60 mL/min (ref >60)
GFR calc non Af Amer: >60 mL/min (ref >60)
Glucose, Bld: 98 mg/dL (ref 70–99)
Potassium: 4.8 mmol/L (ref 3.5–5.1)
Sodium: 135 mmol/L (ref 135–145)

## 2020-03-02 NOTE — Progress Notes (Signed)

## 2020-03-04 ENCOUNTER — Other Ambulatory Visit (HOSPITAL_COMMUNITY)
Admission: RE | Admit: 2020-03-04 | Discharge: 2020-03-04 | Disposition: A | Discharge status: Home / Self Care | Payer: Medicare Other | Source: Ambulatory Visit | Attending: Orthopedic Surgery | Admitting: Orthopedic Surgery

## 2020-03-04 ENCOUNTER — Other Ambulatory Visit: Payer: Self-pay

## 2020-03-04 DIAGNOSIS — Z01812 Encounter for preprocedural laboratory examination: Secondary | ICD-10-CM | POA: Diagnosis not present

## 2020-03-04 DIAGNOSIS — Z20822 Contact with and (suspected) exposure to covid-19: Secondary | ICD-10-CM | POA: Diagnosis not present

## 2020-03-05 LAB — SARS CORONAVIRUS 2 (TAT 6-24 HRS): SARS Coronavirus 2: NEGATIVE

## 2020-03-08 ENCOUNTER — Ambulatory Visit (HOSPITAL_BASED_OUTPATIENT_CLINIC_OR_DEPARTMENT_OTHER): Payer: Medicare Other | Admitting: Anesthesiology

## 2020-03-08 ENCOUNTER — Encounter (HOSPITAL_BASED_OUTPATIENT_CLINIC_OR_DEPARTMENT_OTHER): Payer: Self-pay | Admitting: Orthopedic Surgery

## 2020-03-08 ENCOUNTER — Encounter (HOSPITAL_BASED_OUTPATIENT_CLINIC_OR_DEPARTMENT_OTHER)
Admission: RE | Disposition: A | Discharge status: Home / Self Care | Payer: Self-pay | Source: Home / Self Care | Attending: Orthopedic Surgery

## 2020-03-08 ENCOUNTER — Other Ambulatory Visit: Payer: Self-pay

## 2020-03-08 ENCOUNTER — Ambulatory Visit (HOSPITAL_BASED_OUTPATIENT_CLINIC_OR_DEPARTMENT_OTHER)
Admission: RE | Admit: 2020-03-08 | Discharge: 2020-03-08 | Disposition: A | Discharge status: Home / Self Care | Payer: Medicare Other | Attending: Orthopedic Surgery | Admitting: Orthopedic Surgery

## 2020-03-08 DIAGNOSIS — Z8042 Family history of malignant neoplasm of prostate: Secondary | ICD-10-CM | POA: Insufficient documentation

## 2020-03-08 DIAGNOSIS — Z801 Family history of malignant neoplasm of trachea, bronchus and lung: Secondary | ICD-10-CM | POA: Insufficient documentation

## 2020-03-08 DIAGNOSIS — M1990 Unspecified osteoarthritis, unspecified site: Secondary | ICD-10-CM | POA: Diagnosis not present

## 2020-03-08 DIAGNOSIS — Z91048 Other nonmedicinal substance allergy status: Secondary | ICD-10-CM | POA: Diagnosis not present

## 2020-03-08 DIAGNOSIS — I252 Old myocardial infarction: Secondary | ICD-10-CM | POA: Diagnosis not present

## 2020-03-08 DIAGNOSIS — Z20822 Contact with and (suspected) exposure to covid-19: Secondary | ICD-10-CM | POA: Diagnosis not present

## 2020-03-08 DIAGNOSIS — Z91010 Allergy to peanuts: Secondary | ICD-10-CM | POA: Insufficient documentation

## 2020-03-08 DIAGNOSIS — G4730 Sleep apnea, unspecified: Secondary | ICD-10-CM | POA: Diagnosis not present

## 2020-03-08 DIAGNOSIS — Z820 Family history of epilepsy and other diseases of the nervous system: Secondary | ICD-10-CM | POA: Insufficient documentation

## 2020-03-08 DIAGNOSIS — F419 Anxiety disorder, unspecified: Secondary | ICD-10-CM | POA: Insufficient documentation

## 2020-03-08 DIAGNOSIS — Z880 Allergy status to penicillin: Secondary | ICD-10-CM | POA: Diagnosis not present

## 2020-03-08 DIAGNOSIS — M65321 Trigger finger, right index finger: Secondary | ICD-10-CM | POA: Diagnosis not present

## 2020-03-08 DIAGNOSIS — E785 Hyperlipidemia, unspecified: Secondary | ICD-10-CM | POA: Insufficient documentation

## 2020-03-08 DIAGNOSIS — G709 Myoneural disorder, unspecified: Secondary | ICD-10-CM | POA: Diagnosis not present

## 2020-03-08 DIAGNOSIS — I10 Essential (primary) hypertension: Secondary | ICD-10-CM | POA: Insufficient documentation

## 2020-03-08 DIAGNOSIS — Z888 Allergy status to other drugs, medicaments and biological substances status: Secondary | ICD-10-CM | POA: Insufficient documentation

## 2020-03-08 DIAGNOSIS — Z91030 Bee allergy status: Secondary | ICD-10-CM | POA: Diagnosis not present

## 2020-03-08 DIAGNOSIS — Z87891 Personal history of nicotine dependence: Secondary | ICD-10-CM | POA: Diagnosis not present

## 2020-03-08 DIAGNOSIS — K279 Peptic ulcer, site unspecified, unspecified as acute or chronic, without hemorrhage or perforation: Secondary | ICD-10-CM | POA: Diagnosis not present

## 2020-03-08 DIAGNOSIS — K219 Gastro-esophageal reflux disease without esophagitis: Secondary | ICD-10-CM | POA: Diagnosis not present

## 2020-03-08 DIAGNOSIS — I2510 Atherosclerotic heart disease of native coronary artery without angina pectoris: Secondary | ICD-10-CM | POA: Diagnosis not present

## 2020-03-08 DIAGNOSIS — Z955 Presence of coronary angioplasty implant and graft: Secondary | ICD-10-CM | POA: Insufficient documentation

## 2020-03-08 DIAGNOSIS — Z8719 Personal history of other diseases of the digestive system: Secondary | ICD-10-CM | POA: Diagnosis not present

## 2020-03-08 DIAGNOSIS — Z96641 Presence of right artificial hip joint: Secondary | ICD-10-CM | POA: Insufficient documentation

## 2020-03-08 DIAGNOSIS — M65322 Trigger finger, left index finger: Secondary | ICD-10-CM | POA: Diagnosis not present

## 2020-03-08 HISTORY — PX: TRIGGER FINGER RELEASE: SHX641

## 2020-03-08 SURGERY — RELEASE, A1 PULLEY, FOR TRIGGER FINGER
Anesthesia: Monitor Anesthesia Care | Site: Hand | Laterality: Left

## 2020-03-08 MED ORDER — PROPOFOL 500 MG/50ML IV EMUL
INTRAVENOUS | Status: AC
  Filled 2020-03-08: qty 150

## 2020-03-08 MED ORDER — FENTANYL CITRATE (PF) 100 MCG/2ML IJ SOLN: 50.0000 ug | INTRAMUSCULAR | Status: DC | PRN

## 2020-03-08 MED ORDER — MIDAZOLAM HCL 2 MG/2ML IJ SOLN: 1.0000 mg | INTRAMUSCULAR | Status: DC | PRN

## 2020-03-08 MED ORDER — ONDANSETRON HCL 4 MG/2ML IJ SOLN
INTRAMUSCULAR | Status: DC | PRN
  Administered 2020-03-08: 4 mg via INTRAVENOUS

## 2020-03-08 MED ORDER — CEFAZOLIN SODIUM-DEXTROSE 2-4 GM/100ML-% IV SOLN
INTRAVENOUS | Status: AC
  Filled 2020-03-08: qty 100

## 2020-03-08 MED ORDER — TRAMADOL HCL 50 MG PO TABS: 50.0000 mg | ORAL_TABLET | Freq: Four times a day (QID) | ORAL | 0 refills | Status: DC | PRN

## 2020-03-08 MED ORDER — BUPIVACAINE HCL (PF) 0.25 % IJ SOLN
INTRAMUSCULAR | Status: DC | PRN
  Administered 2020-03-08: 5 mL

## 2020-03-08 MED ORDER — VANCOMYCIN HCL IN DEXTROSE 1-5 GM/200ML-% IV SOLN
1000.0000 mg | INTRAVENOUS | Status: AC
  Administered 2020-03-08: 1000 mg via INTRAVENOUS

## 2020-03-08 MED ORDER — LIDOCAINE HCL (PF) 0.5 % IJ SOLN
INTRAMUSCULAR | Status: AC
  Filled 2020-03-08: qty 150

## 2020-03-08 MED ORDER — LIDOCAINE HCL (PF) 0.5 % IJ SOLN
INTRAMUSCULAR | Status: DC | PRN
  Administered 2020-03-08: 35 mL via INTRAVENOUS

## 2020-03-08 MED ORDER — PROPOFOL 500 MG/50ML IV EMUL
INTRAVENOUS | Status: DC | PRN
  Administered 2020-03-08: 75 ug/kg/min via INTRAVENOUS

## 2020-03-08 MED ORDER — FENTANYL CITRATE (PF) 100 MCG/2ML IJ SOLN
INTRAMUSCULAR | Status: AC
  Filled 2020-03-08: qty 2

## 2020-03-08 MED ORDER — VANCOMYCIN HCL IN DEXTROSE 1-5 GM/200ML-% IV SOLN
INTRAVENOUS | Status: AC
  Filled 2020-03-08: qty 200

## 2020-03-08 MED ORDER — LACTATED RINGERS IV SOLN: INTRAVENOUS | Status: DC

## 2020-03-08 MED ORDER — LIDOCAINE HCL (CARDIAC) PF 100 MG/5ML IV SOSY
PREFILLED_SYRINGE | INTRAVENOUS | Status: DC | PRN
  Administered 2020-03-08: 40 mg via INTRAVENOUS

## 2020-03-08 MED ORDER — FENTANYL CITRATE (PF) 100 MCG/2ML IJ SOLN
INTRAMUSCULAR | Status: DC | PRN
  Administered 2020-03-08: 50 ug via INTRAVENOUS

## 2020-03-08 MED ORDER — CHLORHEXIDINE GLUCONATE 4 % EX LIQD: 60.0000 mL | Freq: Once | CUTANEOUS | Status: DC

## 2020-03-08 MED ORDER — PROPOFOL 10 MG/ML IV BOLUS
INTRAVENOUS | Status: DC | PRN
  Administered 2020-03-08 (×3): 20 mg via INTRAVENOUS

## 2020-03-08 SURGICAL SUPPLY — 36 items
APL PRP STRL LF DISP 70% ISPRP (MISCELLANEOUS) ×1
BLADE SURG 15 STRL LF DISP TIS (BLADE) ×1 IMPLANT
BLADE SURG 15 STRL SS (BLADE) ×2
BNDG CMPR 9X4 STRL LF SNTH (GAUZE/BANDAGES/DRESSINGS)
BNDG COHESIVE 2X5 TAN STRL LF (GAUZE/BANDAGES/DRESSINGS) ×2 IMPLANT
BNDG ESMARK 4X9 LF (GAUZE/BANDAGES/DRESSINGS) IMPLANT
CHLORAPREP W/TINT 26 (MISCELLANEOUS) ×2 IMPLANT
CORD BIPOLAR FORCEPS 12FT (ELECTRODE) IMPLANT
COVER BACK TABLE 60X90IN (DRAPES) ×2 IMPLANT
COVER MAYO STAND STRL (DRAPES) ×2 IMPLANT
COVER WAND RF STERILE (DRAPES) IMPLANT
CUFF TOURN SGL QUICK 18X4 (TOURNIQUET CUFF) IMPLANT
DECANTER SPIKE VIAL GLASS SM (MISCELLANEOUS) IMPLANT
DRAPE EXTREMITY T 121X128X90 (DISPOSABLE) ×2 IMPLANT
DRAPE SURG 17X23 STRL (DRAPES) ×2 IMPLANT
GAUZE SPONGE 4X4 12PLY STRL (GAUZE/BANDAGES/DRESSINGS) ×2 IMPLANT
GAUZE XEROFORM 1X8 LF (GAUZE/BANDAGES/DRESSINGS) ×2 IMPLANT
GLOVE BIOGEL PI IND STRL 7.0 (GLOVE) IMPLANT
GLOVE BIOGEL PI IND STRL 8.5 (GLOVE) ×1 IMPLANT
GLOVE BIOGEL PI INDICATOR 7.0 (GLOVE) ×2
GLOVE BIOGEL PI INDICATOR 8.5 (GLOVE) ×1
GLOVE ECLIPSE 6.5 STRL STRAW (GLOVE) ×1 IMPLANT
GLOVE SURG ORTHO 8.0 STRL STRW (GLOVE) ×2 IMPLANT
GOWN STRL REUS W/ TWL LRG LVL3 (GOWN DISPOSABLE) ×1 IMPLANT
GOWN STRL REUS W/TWL LRG LVL3 (GOWN DISPOSABLE) ×2
GOWN STRL REUS W/TWL XL LVL3 (GOWN DISPOSABLE) ×2 IMPLANT
NDL PRECISIONGLIDE 27X1.5 (NEEDLE) ×1 IMPLANT
NEEDLE PRECISIONGLIDE 27X1.5 (NEEDLE) ×2 IMPLANT
NS IRRIG 1000ML POUR BTL (IV SOLUTION) ×2 IMPLANT
PACK BASIN DAY SURGERY FS (CUSTOM PROCEDURE TRAY) ×2 IMPLANT
STOCKINETTE 4X48 STRL (DRAPES) ×2 IMPLANT
SUT ETHILON 4 0 PS 2 18 (SUTURE) ×2 IMPLANT
SYR BULB 3OZ (MISCELLANEOUS) ×2 IMPLANT
SYR CONTROL 10ML LL (SYRINGE) ×2 IMPLANT
TOWEL GREEN STERILE FF (TOWEL DISPOSABLE) ×4 IMPLANT
UNDERPAD 30X36 HEAVY ABSORB (UNDERPADS AND DIAPERS) ×2 IMPLANT

## 2020-03-08 NOTE — Discharge Instructions (Addendum)

## 2020-03-08 NOTE — Transfer of Care (Signed)
Immediate Anesthesia Transfer of Care Note  Patient: [name] [name]  Procedure(s) Performed: RELEASE TRIGGER FINGER/A-1 PULLEY LEFT INDEX FINGER (Left Hand)  Patient Location: PACU  Anesthesia Type:Bier block  Level of Consciousness: awake, alert , oriented and patient cooperative  Airway & Oxygen Therapy: Patient Spontanous Breathing and Patient connected to face mask oxygen  Post-op Assessment: Report given to RN and Post -op Vital signs reviewed and stable  Post vital signs: Reviewed and stable  Last Vitals:  Vitals Value Taken Time  BP 133/77 03/08/20 1022  Temp    Pulse 63 03/08/20 1023  Resp 5 03/08/20 1023  SpO2 100 % 03/08/20 1023  Vitals shown include unvalidated device data.  Last Pain:  Vitals:   03/08/20 0846  PainSc: 0-No pain      Patients Stated Pain Goal: 3 ([id number])  Complications: No apparent anesthesia complications

## 2020-03-08 NOTE — H&P (Signed)
[name] [name] is an 69 y.o. male.   Chief Complaint: catchingleft index HPI: [name] [name] is a 69 year old former patient who has not seen since 2015. He comes in with difficulty of his left index finger. He complains of catching of his left index finger has been going approximately a year. He was seen by Dr. Apolonio [name] in July for an elbow problem noted that he had the catching of his finger and head and injected at that time. He states this gave him approximately 2 weeks of relief. He has no history of injury. He has not had any treatment since.This is been injected on 2 occasions. He states it continues to be problematic for him. He is continues complain of stiffness. He has had trigger finger release in the past by Dr. Townsend [name]. . Complaining of some stiffness of his index finger at the present time. He states nothing makes it better or worse. He has a history of thyroid problems and arthritis no history of diabetes or gout. Family history is negative for each of these.    Past Medical History:  Diagnosis Date  . Anxiety   . Arthritis    back pain  . Avascular necrosis of bone of hip ([place])    right  . CAD (coronary artery disease)    stents x3 2008  . GERD (gastroesophageal reflux disease)   . History of nuclear stress test    a. Myoview 10/17: EF 56%, diaphragmatic attenuation, no ischemia, low risk  . HTN (hypertension)   . Hyperlipidemia   . Myocardial infarction ([place])   . Paresthesias   . Sleep apnea     Past Surgical History:  Procedure Laterality Date  . CARDIAC CATHETERIZATION     3 stents placed  . COLONOSCOPY  2011 NUR [facility] SCREENING d50 v5   MILD [place] TICS, ? PREP ARTIFACT V. PROCTITIS-rX:[place]. next TCS 2021.  . ESOPHAGEAL DILATION N/A 03/02/2016   Procedure: ESOPHAGEAL DILATION;  Surgeon: [name] Binder, MD;  [facility];  Service: Endoscopy;  Laterality: N/A;  . ESOPHAGOGASTRODUODENOSCOPY  10/03/11   small hiatal hernia/gastric ulcers/stricutre in distal  esophagus  . ESOPHAGOGASTRODUODENOSCOPY N/A 03/02/2016   Dr. Oneida [name]: Schatzki's ring at [place] junction s/p dilation, mild non-erosive gastritis/duodenitis   . ESOPHAGOGASTRODUODENOSCOPY (EGD) WITH PROPOFOL N/A 02/09/2020   Procedure: ESOPHAGOGASTRODUODENOSCOPY (EGD) WITH PROPOFOL;  Surgeon: [name] Binder, MD;  [facility];  Service: Endoscopy;  Laterality: N/A;  2:30pm  . MASS EXCISION Left 09/30/2014   Procedure: EXCISION MASS LEFT WRIST ;  Surgeon: [name] Brod, MD;  Location: [facility];  Service: Orthopedics;  Laterality: Left;  . SAVORY DILATION N/A 02/09/2020   Procedure: SAVORY DILATION;  Surgeon: [name] Binder, MD;  [facility];  Service: Endoscopy;  Laterality: N/A;  . TONSILLECTOMY  age 53  . TOTAL HIP ARTHROPLASTY  age 37   right hip for avascular necrosis of hip and spine    Family History  Problem Relation Age of Onset  . Dementia Mother   . Prostate cancer Father 66  . Lung cancer Sister 50  . Colon cancer Neg Hx   . Colon polyps Neg Hx   . Anesthesia problems Neg Hx   . Hypotension Neg Hx   . Malignant hyperthermia Neg Hx   . Pseudochol deficiency Neg Hx    Social History:  reports that he quit smoking about 12 years ago. His smoking use included cigarettes. He has a 30.00 pack-year smoking history. He has  never used smokeless tobacco. He reports that he does not drink alcohol or use drugs.  Allergies:  Allergies  Allergen Reactions  . Atorvastatin Other (See Comments)    Muscle aches  . Bee Venom Anaphylaxis, Hives and Swelling  . Clopidogrel Bisulfate Hives  . Pravastatin Palpitations    Tolerates 1/2 tablet  . Rosuvastatin Other (See Comments)    Muscle aches  . Penicillins     Pt unsure what type allergy/ was a child when had reaction Did it involve swelling of the face/tongue/throat, SOB, or low BP? Unknown Did it involve sudden or severe rash/hives, skin peeling, or any reaction on the inside of your mouth or nose?  Unknown Did you need to seek medical attention at a hospital or doctor's office? Unknown When did it last happen? If all above answers are "NO", may proceed with cephalosporin use.   . Adhesive [Tape] Rash    Tears skin  . Clopidogrel Rash and Hives  . Omeprazole Palpitations  . Repatha [Evolocumab] Anxiety    Trouble sleeping    No medications prior to admission.    No results found for this or any previous visit (from the past 48 hour(s)).  No results found.   Pertinent items are noted in HPI.  Height 6' (1.829 m), weight 79.4 kg.  General appearance: alert, cooperative and appears stated age Head: Normocephalic, without obvious abnormality, atraumatic Neck: no JVD Resp: clear to auscultation bilaterally Cardio: regular rate and rhythm, S1, S2 normal, no murmur, click, rub or gallop GI: soft, non-tender; bowel sounds normal; no masses,  no organomegaly Extremities: catching left index finger Pulses: 2+ and symmetric Skin: Skin color, texture, turgor normal. No rashes or lesions Neurologic: Grossly normal Incision/Wound: na  Assessment/Plan Assessment:  1. Trigger index finger of left hand  2. Stiffness in joint    Plan: We have discussed surgical release with him. Preperi-and postoperative course been discussed along with risk complications. He is aware there is no guarantee to the surgery the possibility of infection recurrence injury to arteries nerves tendons with the relief symptoms dystrophy. Would like to proceed he is scheduled for release A1 pulley left index finger as an outpatient under regional anesthesia.   [name] [name] 03/08/2020, 6:05 AM

## 2020-03-08 NOTE — Op Note (Signed)
NAME: YEDIDYA MCCOMMON MEDICAL RECORD NO: [id number] DATE OF BIRTH: [birth year] FAC[facility] LOCATION: [facility] PHYSICIAN: Wynonia Sours, MD   OPERATIVE REPORT   DATE OF PROCEDURE: 03/08/20    PREOPERATIVE DIAGNOSIS:   Stenosing tenosynovitis left index finger   POSTOPERATIVE DIAGNOSIS:   Same   PROCEDURE:   Release A1 pulley left index finger   SURGEON: Daryll Brod, M.D.   ASSISTANT: none   ANESTHESIA:  Bier block with sedation and Local   INTRAVENOUS FLUIDS:  Per anesthesia flow sheet.   ESTIMATED BLOOD LOSS:  Minimal.   COMPLICATIONS:  None.   SPECIMENS:  none   TOURNIQUET TIME:    Total Tourniquet Time Documented: Forearm (Left) - 22 minutes Total: Forearm (Left) - 22 minutes    DISPOSITION:  Stable to PACU.   INDICATIONS: Patient is a 69 year old male with a history of trigger left index finger.  This is not responded to multiple injections.  He has elected undergo surgical release of the A1 pulley.  Preperi-and postoperative course been discussed along with risks and complications.  He is aware that there is no guarantee to the surgery the possibility of infection recurrence injury to arteries nerves tendons complete relief symptoms dystrophy.  In the preoperative area the patient is seen the extremity marked by both patient and surgeon antibiotic given  OPERATIVE COURSE: Patient is brought to the operating room where form based IV regional anesthetic was carried out without difficulty under the direction the anesthesia department.  He was prepped using ChloraPrep in the supine position with the left arm free.  A 3-minute dry time was allowed and timeout taken confirming patient procedure.  An oblique incision was made over the A1 pulley of the left index finger carried down through subcutaneous tissue.  Bleeders were electrocauterized with bipolar.  Neurovascular structures identified.  Retractors were placed retracting neurovascular structures  radially and ulnarly the A1 pulley was found to be markedly thickened.  This carefully released on its radial aspect a small incision was made centrally and A2.  Tenosynovial tissue proximally was separated with blunt dissection after placing retractors on each of the superficialis profundus tendon separating them.  Finger placed through full passive range of motion and no further trigger was noted.  The wound was copiously irrigated with saline.  The skin was closed with interrupted 4-0 nylon sutures.  Local infiltration quarter percent bupivacaine without epinephrine was given approximately 5 cc was used.  A sterile compressive dressing with fingers 3 was applied..  Inflation of the tourniquet all fingers immediately pink.  He was taken to the recovery room for observation in satisfactory condition.  He will be discharged home to return the [facility] in 1 week on Tylenol ibuprofen for pain Ultram for breakthrough.   Daryll Brod, MD Electronically signed, 03/08/20

## 2020-03-08 NOTE — Brief Op Note (Signed)
03/08/2020  10:17 AM  PATIENT:  [name] [name]  69 y.o. male  PRE-OPERATIVE DIAGNOSIS:  LEFT INDEX TRIGGER FINGER  POST-OPERATIVE DIAGNOSIS:  LEFT INDEX TRIGGER FINGER  PROCEDURE:  Procedure(s) with comments: RELEASE TRIGGER FINGER/A-1 PULLEY LEFT INDEX FINGER (Left) - IV REGIONAL FOREARM BLOCK  SURGEON:  Surgeon(s) and Role:    * Daryll Brod, MD - Primary  PHYSICIAN ASSISTANT:   ASSISTANTS: none   ANESTHESIA:   local, regional and IV sedation  EBL:  18ml  BLOOD ADMINISTERED:none  DRAINS: none   LOCAL MEDICATIONS USED:  BUPIVICAINE   SPECIMEN:  No Specimen  DISPOSITION OF SPECIMEN:  N/A  COUNTS:  YES  TOURNIQUET:  * Missing tourniquet times found for documented tourniquets in log: [id number] *  DICTATION: .Viviann Spare Dictation  PLAN OF CARE: Discharge to home after PACU  PATIENT DISPOSITION:  PACU - hemodynamically stable.

## 2020-03-08 NOTE — Anesthesia Preprocedure Evaluation (Signed)
Anesthesia Evaluation  Patient identified by MRN, date of birth, ID band Patient awake    Reviewed: Allergy & Precautions, H&P , NPO status , Patient's Chart, lab work & pertinent test results, reviewed documented beta blocker date and time   History of Anesthesia Complications Negative for: history of anesthetic complications  Airway Mallampati: II  TM Distance: >3 FB Neck ROM: Full    Dental no notable dental hx.  Crowns :   Pulmonary neg pulmonary ROS, sleep apnea (not on CPAP) , former smoker,    Pulmonary exam normal breath sounds clear to auscultation       Cardiovascular Exercise Tolerance: Good hypertension, Pt. on medications and Pt. on home beta blockers + CAD, + Past MI and + Cardiac Stents  negative cardio ROS   Rhythm:Regular Rate:Normal   Nuclear stress EF: 67%. No wall motion abnormalities  There was no ST segment deviation noted during stress.  This is a low risk study. There are no perfusion defects, no evidence of ischemia identified.  The study is normal.    Neuro/Psych Anxiety  Neuromuscular disease negative neurological ROS  negative psych ROS   GI/Hepatic negative GI ROS, Neg liver ROS, PUD, GERD  Medicated and Poorly Controlled,  Endo/Other  negative endocrine ROSHypothyroidism   Renal/GU negative Renal ROS  negative genitourinary   Musculoskeletal  (+) Arthritis ,   Abdominal   Peds  Hematology negative hematology ROS (+)   Anesthesia Other Findings   Reproductive/Obstetrics negative OB ROS                             Anesthesia Physical  Anesthesia Plan  ASA: III  Anesthesia Plan: Bier Block and MAC and Bier Block-LIDOCAINE ONLY   Post-op Pain Management:    Induction: Intravenous  PONV Risk Score and Plan: 2 and Ondansetron and Dexamethasone  Airway Management Planned: Oral ETT and LMA  Additional Equipment:   Intra-op Plan:    Post-operative Plan:   Informed Consent: I have reviewed the patients History and Physical, chart, labs and discussed the procedure including the risks, benefits and alternatives for the proposed anesthesia with the patient or authorized representative who has indicated his/her understanding and acceptance.     Dental advisory given  Plan Discussed with: CRNA, Surgeon and Anesthesiologist  Anesthesia Plan Comments:         Anesthesia Quick Evaluation

## 2020-03-09 ENCOUNTER — Encounter: Payer: Self-pay | Admitting: *Deleted

## 2020-03-09 NOTE — Anesthesia Postprocedure Evaluation (Signed)
Anesthesia Post Note  Patient: [name] [name]  Procedure(s) Performed: RELEASE TRIGGER FINGER/A-1 PULLEY LEFT INDEX FINGER (Left Hand)     Patient location during evaluation: PACU Anesthesia Type: MAC Level of consciousness: awake and alert Pain management: pain level controlled Vital Signs Assessment: post-procedure vital signs reviewed and stable Respiratory status: spontaneous breathing, nonlabored ventilation, respiratory function stable and patient connected to nasal cannula oxygen Cardiovascular status: stable and blood pressure returned to baseline Postop Assessment: no apparent nausea or vomiting Anesthetic complications: no    Last Vitals:  Vitals:   03/08/20 1030 03/08/20 1108  BP: 121/80 (!) 129/94  Pulse: (!) 54 61  Resp: 16 16  Temp:  36.5 C  SpO2: 99% 100%    Last Pain:  Vitals:   03/09/20 0958  PainSc: 2                  Treyvin Glidden

## 2020-03-29 DIAGNOSIS — E7849 Other hyperlipidemia: Secondary | ICD-10-CM | POA: Diagnosis not present

## 2020-03-29 DIAGNOSIS — R7309 Other abnormal glucose: Secondary | ICD-10-CM | POA: Diagnosis not present

## 2020-03-30 ENCOUNTER — Telehealth: Payer: Self-pay | Admitting: Gastroenterology

## 2020-03-30 NOTE — Telephone Encounter (Signed)
Pt said SF did a procedure on him over a month ago and hasn't heard back of his results. Please call [phone]

## 2020-03-30 NOTE — Telephone Encounter (Signed)
Procedure was done on 2/9/

## 2020-03-30 NOTE — Telephone Encounter (Signed)
PLEASE CALL PT. There was an ERROR ON HIS AVS. NO BIOPSIES WERE TAKEN. WE ONLY STRETCHED HIS ESOPHAGUS.

## 2020-03-30 NOTE — Telephone Encounter (Signed)
Called pt and notified  him that  There was an ERROR ON HIS AVS. NO BIOPSIES WERE TAKEN. WE ONLY STRETCHED HIS ESOPHAGUS. Pt stated is is having acid reflux and if it goets any worse he would need to make an appt to be seen. He stated he is drinking tea and eating before bed but he will make sure he waits 3 hours after he eats to go to sleep and if this  does not help he will f/u with [place]

## 2020-05-08 NOTE — Progress Notes (Signed)
Cardiology Office Note   Date:  05/09/2020   ID:  [name], [name] December 26, 1951, MRN [id number]  PCP:  Sharilyn Sites, MD  Cardiologist:   Dorris Carnes, MD   F/U CAD      History of Present Illness: [name] [name] is a 69 y.o. male with a history ofCAD, HTN, PV disease, and HLDseen for numbness and tingling.   History of CAD s/p NSTEMI in 2008 tx with PCI of bifurcational lesion in LAD/Dx with 3DES.He has a hx of rash with Plavix and Ticlid. He underwent Plavix desensitization in 2009. Intolerance to [place] (caused nightmares).Low risk stress test 09/2016.  Seem by me for chest pain 08/12/2019. Exertional chest pain radiating to his neck and bilateral shoulder. Resolved spontaneously within 5 minutes. Did not require sublingual nitroglycerin. Elevated BP. Added coreg for antianginal and high BP. Follow up stress test low risk without ischemia.   Seen by GI 01/27/20 for dysphagia/odynophagia. Plan for EGD +/- ED with propofol.  Patient presented for evaluation of 2 episodes of sudden onset numbness and tingling starting between shoulder blade, this radiates to his shoulder bilaterally and to the cheeks as well as bilateral arms.  No chest pressure or tightness.  No palpitation or shortness of breath.  His blood pressure and pulse was normal per patient when he was having these episodes.  Denies prior injury.  He walks with dog every day without chest pain or shortness of breath.   The pt was seen by B Bhagat in Feb 2021    Patient has a couple spells   Feels coming on   Like a muscle cramp  Tingling  Intensifies   Mid chest to shoulders to back/neck and face   Pulling  Tingling Tightness Can happen any time   Usually standing when happens   Sat was at kitchen counter  Last about 2 to 3 min   Like muscle cramp eases off slowly  Then gone   Feels weak after    Has tried NTG without help    O Saturday tried pulling   May have helpled   Patient says he is walking  1-2 miles per day   No preoblems  wheh walking with SOB or chest pain  Current Meds  Medication Sig  . acetaminophen (TYLENOL) 500 MG tablet Take 1,000 mg by mouth every 6 (six) hours as needed for moderate pain.   Marland Kitchen alprostadil (EDEX) 20 MCG injection 20 mcg by Intracavitary route as needed for erectile dysfunction. use no more than 3 times per week  . aspirin EC 81 MG tablet Take 1 tablet (81 mg total) by mouth daily.  . calcium carbonate (TUMS EX) 750 MG chewable tablet Chew 1 tablet by mouth 2 (two) times daily as needed for heartburn.   . carvedilol (COREG) 3.125 MG tablet Take 1 tablet (3.125 mg total) by mouth 2 (two) times daily with a meal.  . ezetimibe (ZETIA) 10 MG tablet Take 5 mg by mouth daily.  . famotidine (PEPCID) 20 MG tablet Take 1 tablet (20 mg total) by mouth 2 (two) times daily.  . hydrochlorothiazide (HYDRODIURIL) 25 MG tablet Take 12.5 mg by mouth daily.  Marland Kitchen ketotifen (ZADITOR) 0.025 % ophthalmic solution Place 1 drop into both eyes daily as needed (itchy eyes).  Marland Kitchen levothyroxine (SYNTHROID, LEVOTHROID) 75 MCG tablet Take 75 mcg by mouth daily before breakfast.  . losartan (COZAAR) 100 MG tablet Take 50 mg by mouth 2 (two) times daily.  . nitroGLYCERIN (  NITROSTAT) 0.4 MG SL tablet Place 1 tablet (0.4 mg total) under the tongue every 5 (five) minutes as needed for chest pain. X 3 doses  . pantoprazole (PROTONIX) 40 MG tablet Take 40 mg by mouth daily.   . pravastatin (PRAVACHOL) 40 MG tablet Take 20 mg by mouth at bedtime.     Allergies:   Atorvastatin, Bee venom, Clopidogrel bisulfate, Pravastatin, Rosuvastatin, Penicillins, Adhesive [tape], Clopidogrel, Omeprazole, and Repatha [evolocumab]   Past Medical History:  Diagnosis Date  . Anxiety   . Arthritis    back pain  . Avascular necrosis of bone of hip ([place])    right  . CAD (coronary artery disease)    stents x3 2008  . GERD (gastroesophageal reflux disease)   . History of nuclear stress test    a. Myoview  10/17: EF 56%, diaphragmatic attenuation, no ischemia, low risk  . HTN (hypertension)   . Hyperlipidemia   . Myocardial infarction ([place])   . Paresthesias   . Sleep apnea     Past Surgical History:  Procedure Laterality Date  . CARDIAC CATHETERIZATION     3 stents placed  . COLONOSCOPY  2011 NUR [facility] SCREENING d50 v5   MILD [place] TICS, ? PREP ARTIFACT V. PROCTITIS-rX:[place]. next TCS 2021.  . ESOPHAGEAL DILATION N/A 03/02/2016   Procedure: ESOPHAGEAL DILATION;  Surgeon: Danie Binder, MD;  [facility];  Service: Endoscopy;  Laterality: N/A;  . ESOPHAGOGASTRODUODENOSCOPY  10/03/11   small hiatal hernia/gastric ulcers/stricutre in distal esophagus  . ESOPHAGOGASTRODUODENOSCOPY N/A 03/02/2016   Dr. Oneida Alar: Schatzki's ring at [place] junction s/p dilation, mild non-erosive gastritis/duodenitis   . ESOPHAGOGASTRODUODENOSCOPY (EGD) WITH PROPOFOL N/A 02/09/2020   Procedure: ESOPHAGOGASTRODUODENOSCOPY (EGD) WITH PROPOFOL;  Surgeon: Danie Binder, MD;  [facility];  Service: Endoscopy;  Laterality: N/A;  2:30pm  . MASS EXCISION Left 09/30/2014   Procedure: EXCISION MASS LEFT WRIST ;  Surgeon: Daryll Brod, MD;  Location: [place];  Service: Orthopedics;  Laterality: Left;  . SAVORY DILATION N/A 02/09/2020   Procedure: SAVORY DILATION;  Surgeon: Danie Binder, MD;  [facility];  Service: Endoscopy;  Laterality: N/A;  . TONSILLECTOMY  age 60  . TOTAL HIP ARTHROPLASTY  age 59   right hip for avascular necrosis of hip and spine  . TRIGGER FINGER RELEASE Left 03/08/2020   Procedure: RELEASE TRIGGER FINGER/A-1 PULLEY LEFT INDEX FINGER;  Surgeon: Daryll Brod, MD;  Location: [place];  Service: Orthopedics;  Laterality: Left;  IV REGIONAL FOREARM BLOCK     Social History:  The patient  reports that he quit smoking about 12 years ago. His smoking use included cigarettes. He has a 30.00 pack-year smoking history. He has never used smokeless tobacco.  He reports that he does not drink alcohol or use drugs.   Family History:  The patient's family history includes Dementia in his mother; Lung cancer (age of onset: 83) in his sister; Prostate cancer (age of onset: 18) in his father.    ROS:  Please see the history of present illness. All other systems are reviewed and  Negative to the above problem except as noted.    PHYSICAL EXAM: VS:  BP 132/86   Pulse 69   Ht 6' (1.829 m)   Wt 180 lb 9.6 oz (81.9 kg)   SpO2 98%   BMI 24.49 kg/m   GEN: Well nourished, well developed, in no acute distress  HEENT: normal  Neck: no JVD,  carotid bruits, or masses Cardiac: RRR; no murmurs, rubs, or gallops,no edema  Respiratory:  clear to auscultation bilaterally, normal work of breathing GI: soft, nontender, nondistended, + BS  No hepatomegaly  MS: no deformity Moving all extremities   Skin: warm and dry, no rash Neuro:  Strength and sensation are intact Psych: euthymic mood, full affect   EKG:  EKG is not ordered today.   Lipid Panel    Component Value Date/Time   CHOL 112 01/24/2018 0926   TRIG 68 01/24/2018 0926   TRIG 107 08/15/2009 1335   HDL 41 01/24/2018 0926   CHOLHDL 2.7 01/24/2018 0926   CHOLHDL 4.5 01/30/2016 1005   VLDL 24 01/30/2016 1005   LDLCALC 57 01/24/2018 0926   LDLCALC 87 08/15/2009 1335      Wt Readings from Last 3 Encounters:  05/09/20 180 lb 9.6 oz (81.9 kg)  03/08/20 177 lb 14.6 oz (80.7 kg)  02/05/20 178 lb (80.7 kg)      ASSESSMENT AND PLAN:  1  CAD   I am not convincied that the "spells " he is having reflect angina    I think they are musculoskel in origin with spasm in back/neck  Will refer to [facility]  2  HL   Intolerant to statins except pravachol Unfort pravachol and Zetia is not getting LDL to 70   Discussed with pharmacy   Looking into Praluent coverage  3  HTN  BP is fair 100s to 140/   Continue current meds     Keep active    F/U in Jan 2022   Current medicines are reviewed at length  with the patient today.  The patient does not have concerns regarding medicines.  Signed, Dorris Carnes, MD  05/09/2020 10:43 AM    [address], [place], [place]  [address] Phone: [phone]; Fax: [phone]

## 2020-05-09 ENCOUNTER — Ambulatory Visit (INDEPENDENT_AMBULATORY_CARE_PROVIDER_SITE_OTHER): Payer: Medicare Other | Admitting: Internal Medicine

## 2020-05-09 ENCOUNTER — Other Ambulatory Visit: Payer: Self-pay

## 2020-05-09 ENCOUNTER — Encounter: Payer: Self-pay | Admitting: Internal Medicine

## 2020-05-09 VITALS — BP 132/86 | HR 69 | Ht 72.0 in | Wt 180.6 lb

## 2020-05-09 DIAGNOSIS — I25110 Atherosclerotic heart disease of native coronary artery with unstable angina pectoris: Secondary | ICD-10-CM

## 2020-05-09 DIAGNOSIS — R0789 Other chest pain: Secondary | ICD-10-CM

## 2020-05-09 DIAGNOSIS — M549 Dorsalgia, unspecified: Secondary | ICD-10-CM | POA: Diagnosis not present

## 2020-05-09 NOTE — Patient Instructions (Signed)
Medication Instructions:  No changes *If you need a refill on your cardiac medications before your next appointment, please call your pharmacy*   Lab Work: none If you have labs (blood work) drawn today and your tests are completely normal, you will receive your results only by: Marland Kitchen MyChart Message (if you have MyChart) OR . A paper copy in the mail If you have any lab test that is abnormal or we need to change your treatment, we will call you to review the results.   Testing/Procedures: none   Follow-Up: At [facility], you and your health needs are our priority.  As part of our continuing mission to provide you with exceptional heart care, we have created designated Provider Care Teams.  These Care Teams include your primary Cardiologist (physician) and Advanced Practice Providers (APPs -  Physician Assistants and Nurse Practitioners) who all work together to provide you with the care you need, when you need it.  Your next appointment:   8 month(s)  The format for your next appointment:   Either In Person or Virtual  Provider:   You may see Dorris Carnes, MD or one of the following Advanced Practice Providers on your designated Care Team:    Richardson Dopp, PA-C  Robbie Lis, [place]    Other Instructions

## 2020-05-19 ENCOUNTER — Other Ambulatory Visit: Payer: Self-pay

## 2020-05-19 DIAGNOSIS — E785 Hyperlipidemia, unspecified: Secondary | ICD-10-CM

## 2020-05-23 ENCOUNTER — Other Ambulatory Visit: Payer: Self-pay

## 2020-05-23 ENCOUNTER — Other Ambulatory Visit: Payer: Medicare Other | Admitting: *Deleted

## 2020-05-23 DIAGNOSIS — E785 Hyperlipidemia, unspecified: Secondary | ICD-10-CM | POA: Diagnosis not present

## 2020-05-23 LAB — LIPID PANEL
Chol/HDL Ratio: 4.1 ratio (ref 0.0–5.0)
Cholesterol, Total: 142 mg/dL (ref 100–199)
HDL: 35 mg/dL — ABNORMAL LOW (ref >39)
LDL Chol Calc (NIH): 92 mg/dL (ref 0–99)
Triglycerides: 79 mg/dL (ref 0–149)
VLDL Cholesterol Cal: 15 mg/dL (ref 5–40)

## 2020-05-26 MED ORDER — PRALUENT 75 MG/ML ~~LOC~~ SOAJ: 1.0000 "pen " | SUBCUTANEOUS | 11 refills | Status: DC

## 2020-05-26 NOTE — Progress Notes (Signed)
PA approved through 05/26/21. Rx sent to [facility] Patient advised to get copay card from [email or web address]

## 2020-05-26 NOTE — Addendum Note (Signed)
Addended by: Marcelle Overlie D on: 05/26/2020 05:18 PM   Modules accepted: Orders

## 2020-06-14 ENCOUNTER — Encounter: Payer: Self-pay | Admitting: Gastroenterology

## 2020-06-14 ENCOUNTER — Other Ambulatory Visit: Payer: Self-pay

## 2020-06-14 ENCOUNTER — Ambulatory Visit (INDEPENDENT_AMBULATORY_CARE_PROVIDER_SITE_OTHER): Payer: Medicare Other | Admitting: Gastroenterology

## 2020-06-14 VITALS — BP 150/81 | HR 56 | Temp 96.9°F | Ht 72.0 in | Wt 176.8 lb

## 2020-06-14 DIAGNOSIS — R1310 Dysphagia, unspecified: Secondary | ICD-10-CM

## 2020-06-14 DIAGNOSIS — Z1211 Encounter for screening for malignant neoplasm of colon: Secondary | ICD-10-CM

## 2020-06-14 DIAGNOSIS — K219 Gastro-esophageal reflux disease without esophagitis: Secondary | ICD-10-CM | POA: Diagnosis not present

## 2020-06-14 DIAGNOSIS — R1319 Other dysphagia: Secondary | ICD-10-CM

## 2020-06-14 DIAGNOSIS — I25110 Atherosclerotic heart disease of native coronary artery with unstable angina pectoris: Secondary | ICD-10-CM | POA: Diagnosis not present

## 2020-06-14 NOTE — Patient Instructions (Signed)
1. Colonoscopy to be scheduled. See separate instructions.  2. We will reach out to the [place] for approval of colonoscopy given dates are almost out.  3. We will be in touch when letter is available.  4. Continue pantoprazole 40mg  twice daily. You can continue pepcid 20mg  twice daily if needed to control reflux symptoms.

## 2020-06-14 NOTE — Progress Notes (Signed)
Primary Care Physician: Sharilyn Sites, MD  Primary Gastroenterologist:  Formerly Barney Drain, MD   Chief Complaint  Patient presents with  . Follow-up    f/u dysphagia, ov to set up TCS    HPI: [name] [name] is a 69 y.o. male here for follow-up.  He was seen back in January for dysphagia at the request of the [place].  He also has a history of chronic GERD.  Previously intolerant to omeprazole.  He had had EGD in March 2020 at [facility] and noted to have an esophageal stricture which was "too wide" for [place].  Bleeding noted from esophageal biopsy versus varix?  Esophageal biopsy with reflux esophagitis, focal epithelial atypia indeterminate for dysplasia.  Also had nonspecific gastritis.  He was advised to follow-up with EGD in 6 months.  He presented to our practice to expedite his care at the request of the [place].   EGD February 2021 by Dr. Oneida Alar showed low-grade narrowing Schatzki ring status post dilation, moderate gastritis.  No biopsies obtained.  His last colonoscopy was in 2011, had mild sigmoid colon diverticula,?Prep artifact versus proctitis prescribed [place], next colonoscopy 2021.  States his stools are loose with pantoprazole bid. If only takes once per day, then stools better. No melena, brbpr. No unintentional weight loss. Swallowing has improved. No heartburn but requires both PPI and H2 blockers. No abdominal pain. Would like to schedule his colonoscopy here locally if VA will approve.   Current Outpatient Medications  Medication Sig Dispense Refill  . acetaminophen (TYLENOL) 500 MG tablet Take 1,000 mg by mouth as needed for moderate pain.     . Alirocumab (PRALUENT) 75 MG/ML SOAJ Inject 1 pen into the skin every 14 (fourteen) days. 2 pen 11  . aspirin EC 81 MG tablet Take 1 tablet (81 mg total) by mouth daily. 90 tablet 3  . calcium carbonate (TUMS EX) 750 MG chewable tablet Chew 1 tablet by mouth 2 (two) times daily as needed for heartburn.     .  carvedilol (COREG) 3.125 MG tablet Take 1 tablet (3.125 mg total) by mouth 2 (two) times daily with a meal. 180 tablet 3  . ezetimibe (ZETIA) 10 MG tablet Take 5 mg by mouth daily.    . famotidine (PEPCID) 20 MG tablet Take 1 tablet (20 mg total) by mouth 2 (two) times daily. 180 tablet 3  . hydrochlorothiazide (HYDRODIURIL) 25 MG tablet Take 12.5 mg by mouth daily.    Marland Kitchen ibuprofen (ADVIL) 200 MG tablet Take 200 mg by mouth every 6 (six) hours as needed. Takes 2 tablets as needed.    Marland Kitchen ketotifen (ZADITOR) 0.025 % ophthalmic solution Place 1 drop into both eyes daily as needed (itchy eyes).    Marland Kitchen levothyroxine (SYNTHROID, LEVOTHROID) 75 MCG tablet Take 75 mcg by mouth daily before breakfast.    . losartan (COZAAR) 100 MG tablet Take 50 mg by mouth 2 (two) times daily.    . nitroGLYCERIN (NITROSTAT) 0.4 MG SL tablet Place 1 tablet (0.4 mg total) under the tongue every 5 (five) minutes as needed for chest pain. X 3 doses 25 tablet 3  . pantoprazole (PROTONIX) 40 MG tablet Take 40 mg by mouth in the morning and at bedtime.     . pravastatin (PRAVACHOL) 40 MG tablet Take 20 mg by mouth at bedtime.     No current facility-administered medications for this visit.    Allergies as of 06/14/2020 - Review Complete 06/14/2020  Allergen Reaction Noted  . Atorvastatin Other (See Comments) 08/17/2014  . Bee venom Anaphylaxis, Hives, and Swelling 09/24/2011  . Clopidogrel bisulfate Hives 02/20/2010  . Pravastatin Palpitations 09/03/2017  . Rosuvastatin Other (See Comments) 08/17/2014  . Penicillins  02/20/2010  . Adhesive [tape] Rash 01/29/2020  . Clopidogrel Rash and Hives 05/22/2016  . Omeprazole Palpitations 02/04/2018  . Repatha [evolocumab] Anxiety 03/13/2019   Past Medical History:  Diagnosis Date  . Anxiety   . Arthritis    back pain  . Avascular necrosis of bone of hip ([place])    right  . CAD (coronary artery disease)    stents x3 2008  . GERD (gastroesophageal reflux disease)   . History  of nuclear stress test    a. Myoview 10/17: EF 56%, diaphragmatic attenuation, no ischemia, low risk  . HTN (hypertension)   . Hyperlipidemia   . Myocardial infarction ([place])   . Paresthesias   . Sleep apnea    Past Surgical History:  Procedure Laterality Date  . CARDIAC CATHETERIZATION     3 stents placed  . COLONOSCOPY  2011 NUR [facility] SCREENING d50 v5   MILD  TICS, ? PREP ARTIFACT V. PROCTITIS-rX:[place]. next TCS 2021.  . ESOPHAGEAL DILATION N/A 03/02/2016   Procedure: ESOPHAGEAL DILATION;  Surgeon: Danie Binder, MD;  [facility];  Service: Endoscopy;  Laterality: N/A;  . ESOPHAGOGASTRODUODENOSCOPY  10/03/11   small hiatal hernia/gastric ulcers/stricutre in distal esophagus  . ESOPHAGOGASTRODUODENOSCOPY N/A 03/02/2016   Dr. Oneida Alar: Schatzki's ring at [place] junction s/p dilation, mild non-erosive gastritis/duodenitis   . ESOPHAGOGASTRODUODENOSCOPY (EGD) WITH PROPOFOL N/A 02/09/2020   Dr. Oneida Alar: Low-grade narrowing Schatzki ring status post dilation, moderate gastritis, no biopsies taken  . MASS EXCISION Left 09/30/2014   Procedure: EXCISION MASS LEFT WRIST ;  Surgeon: Daryll Brod, MD;  Location: [place];  Service: Orthopedics;  Laterality: Left;  . SAVORY DILATION N/A 02/09/2020   Procedure: SAVORY DILATION;  Surgeon: Danie Binder, MD;  [facility];  Service: Endoscopy;  Laterality: N/A;  . TONSILLECTOMY  age 8  . TOTAL HIP ARTHROPLASTY  age 18   right hip for avascular necrosis of hip and spine  . TRIGGER FINGER RELEASE Left 03/08/2020   Procedure: RELEASE TRIGGER FINGER/A-1 PULLEY LEFT INDEX FINGER;  Surgeon: Daryll Brod, MD;  Location: [place];  Service: Orthopedics;  Laterality: Left;  IV REGIONAL FOREARM BLOCK   Family History  Problem Relation Age of Onset  . Dementia Mother   . Prostate cancer Father 11  . Lung cancer Sister 31  . Colon cancer Neg Hx   . Colon polyps Neg Hx   . Anesthesia problems Neg Hx   . Hypotension  Neg Hx   . Malignant hyperthermia Neg Hx   . Pseudochol deficiency Neg Hx    Social History   Tobacco Use  . Smoking status: Former Smoker    Packs/day: 1.00    Years: 30.00    Pack years: 30.00    Types: Cigarettes    Quit date: 09/30/2007    Years since quitting: 12.7  . Smokeless tobacco: Never Used  Vaping Use  . Vaping Use: Never used  Substance Use Topics  . Alcohol use: No  . Drug use: No     ROS:  General: Negative for anorexia, weight loss, fever, chills, fatigue, weakness. ENT: Negative for hoarseness, difficulty swallowing , nasal congestion. CV: Negative for chest pain, angina, palpitations, dyspnea on exertion, peripheral edema.  Respiratory: Negative for dyspnea at rest, dyspnea on exertion, cough, sputum, wheezing.  GI: See history of present illness. GU:  Negative for dysuria, hematuria, urinary incontinence, urinary frequency, nocturnal urination.  Endo: Negative for unusual weight change.    Physical Examination:   BP (!) 150/81   Pulse (!) 56   Temp (!) 96.9 F (36.1 C) (Temporal)   Ht 6' (1.829 m)   Wt 176 lb 12.8 oz (80.2 kg)   BMI 23.98 kg/m   General: Well-nourished, well-developed in no acute distress.  Eyes: No icterus. Mouth: Oropharyngeal mucosa moist and pink , no lesions erythema or exudate. Lungs: Clear to auscultation bilaterally.  Heart: Regular rate and rhythm, no murmurs rubs or gallops.  Abdomen: Bowel sounds are normal, nontender, nondistended, no hepatosplenomegaly or masses, no abdominal bruits or hernia , no rebound or guarding.   Extremities: No lower extremity edema. No clubbing or deformities. Neuro: Alert and oriented x 4   Skin: Warm and dry, no jaundice.   Psych: Alert and cooperative, normal mood and affect.  Imaging Studies: No results found.  Impression/Plan:  69 y/o male presenting for follow up of dysphagia. EGD with dilation as outlined above. Clinically improved. His reflux is well controlled but requires  pantoprazole 40mg  BID and pepcid 20mg  up to twice per day, medications prescribed by his [place] provider.   He is due for 10 year screening colonoscopy and would like to have done locally if approved by [place]. His authorization dates are about to expire and there is no availability on the schedule. ASA II. Plan on colonoscopy once schedule available and get approval from [place].  I have discussed the risks, alternatives, benefits with regards to but not limited to the risk of reaction to medication, bleeding, infection, perforation and the patient is agreeable to proceed. Written consent to be obtained.  Patient also requested we supply him with a letter regarding his GERD for VA benefits. Will discuss with Dr. Gala Romney as patient reports the letter would have to come from a doctor.

## 2020-06-15 ENCOUNTER — Telehealth: Payer: Self-pay | Admitting: Gastroenterology

## 2020-06-15 NOTE — Telephone Encounter (Signed)
Pt was seen in office yesterday, His [place] expires 06/27/2020. Pt was told to follow up with the [place] to get new VA authorization. He called today saying he called the [place] and was told that they extended his [place] until 08/07/2020. I told him I didn't think they would have him scheduled before then since the new doctor hasn't arrived yet. He said he may just have the procedure done at the [place] instead. [phone]

## 2020-06-15 NOTE — Telephone Encounter (Signed)
Called pt, he's aware we are waiting for new doctor's schedule and it's very likely he will not be scheduled before [place] auth expires. He spoke to [place] and they're not sure when they can schedule TCS. He's ok with our office waiting to see when TCS can be scheduled. He will let us know in the meantime if he decides to have TCS done at [place].

## 2020-06-17 NOTE — Progress Notes (Signed)
Cc'ed to pcp °

## 2020-06-24 ENCOUNTER — Encounter: Payer: Self-pay | Admitting: Gastroenterology

## 2020-06-24 NOTE — Progress Notes (Signed)
Please call patient and let him know that his letter and packet are available for pick up. I have placed it on AM's desk.

## 2020-06-27 ENCOUNTER — Telehealth: Payer: Self-pay | Admitting: *Deleted

## 2020-06-27 NOTE — Telephone Encounter (Signed)
Noted. Patient taken off list to be called for procedure

## 2020-06-27 NOTE — Telephone Encounter (Addendum)
Received VA referral on pt but it had the receiving referral provider listed as Therapist, music at [facility] [employer].  Called and spoke to [place] at the [place] clinic to verify if the referral was supposed to come to [place] since we see the pt.  She said that pt was truly being referred to [facility].  Informed her that I would make a notation of it.  Will forward referral to [place].  Called pt and he confirmed that he is being referred to [facility].  He was made aware that he would become a patient of theirs moving forward.  He was made aware that we normally don't see each other's patients.  He voiced understanding and said he would like to proceed with referral to [facility] since [place] has coordinated it.  Routing to LSL and Clinical Pool as FYI.

## 2020-06-28 ENCOUNTER — Encounter: Payer: Self-pay | Admitting: Gastroenterology

## 2020-06-29 DIAGNOSIS — E039 Hypothyroidism, unspecified: Secondary | ICD-10-CM | POA: Diagnosis not present

## 2020-06-29 DIAGNOSIS — I10 Essential (primary) hypertension: Secondary | ICD-10-CM | POA: Diagnosis not present

## 2020-06-29 DIAGNOSIS — E7849 Other hyperlipidemia: Secondary | ICD-10-CM | POA: Diagnosis not present

## 2020-07-01 ENCOUNTER — Other Ambulatory Visit: Payer: Self-pay

## 2020-07-01 ENCOUNTER — Ambulatory Visit (AMBULATORY_SURGERY_CENTER): Payer: Self-pay | Admitting: *Deleted

## 2020-07-01 VITALS — Ht 72.0 in | Wt 174.0 lb

## 2020-07-01 DIAGNOSIS — Z1211 Encounter for screening for malignant neoplasm of colon: Secondary | ICD-10-CM

## 2020-07-01 MED ORDER — PLENVU 140 G PO SOLR: 1.0000 | ORAL | 0 refills | Status: DC

## 2020-07-01 NOTE — Progress Notes (Signed)
03-11-20 covid vacc x 2  No egg or soy allergy known to patient  No issues with past sedation with any surgeries  or procedures, no intubation problems  No diet pills per patient No home 02 use per patient  No blood thinners per patient  Pt denies issues with constipation  No A fib or A flutter  EMMI video sent to pt's e mail  COVID 19 guidelines implemented in [place] today   Plenvu prep- Pt states he cannot swallow Sutabs due to Dysphagia - Plenvu sample 89791  Exp 12/2020   Due to the COVID-19 pandemic we are asking patients to follow these guidelines. Please only bring one care partner. Please be aware that your care partner may wait in the car in the parking lot or if they feel like they will be too hot to wait in the car, they may wait in the lobby on the 4th floor. All care partners are required to wear a mask the entire time (we do not have any that we can provide them), they need to practice social distancing, and we will do a Covid check for all patient's and care partners when you arrive. Also we will check their temperature and your temperature. If the care partner waits in their car they need to stay in the parking lot the entire time and we will call them on their cell phone when the patient is ready for discharge so they can bring the car to the front of the building. Also all patient's will need to wear a mask into building.

## 2020-07-05 NOTE — Telephone Encounter (Signed)
Noted  

## 2020-07-07 NOTE — Telephone Encounter (Signed)
Pt's records are ready for pickup. Called pt and pt will pick records up as directed.

## 2020-07-11 ENCOUNTER — Ambulatory Visit (AMBULATORY_SURGERY_CENTER): Payer: Medicare Other | Admitting: Gastroenterology

## 2020-07-11 ENCOUNTER — Other Ambulatory Visit: Payer: Self-pay

## 2020-07-11 ENCOUNTER — Encounter: Payer: Self-pay | Admitting: Gastroenterology

## 2020-07-11 VITALS — BP 109/85 | HR 68 | Temp 97.8°F | Resp 9 | Ht 72.0 in | Wt 174.0 lb

## 2020-07-11 DIAGNOSIS — Z1211 Encounter for screening for malignant neoplasm of colon: Secondary | ICD-10-CM

## 2020-07-11 MED ORDER — SODIUM CHLORIDE 0.9 % IV SOLN: 500.0000 mL | Freq: Once | INTRAVENOUS | Status: DC

## 2020-07-11 NOTE — Progress Notes (Signed)
Pt's states no medical or surgical changes since previsit or office visit.   V/s-cw  Check-in-jb 

## 2020-07-11 NOTE — Patient Instructions (Signed)
Read all of the handouts given to you by your recovery room nurse.  Thank-you for choosing us for your healthcare needs today.  YOU HAD AN ENDOSCOPIC PROCEDURE TODAY AT THE [facility]:   Refer to the procedure report that was given to you for any specific questions about what was found during the examination.  If the procedure report does not answer your questions, please call your gastroenterologist to clarify.  If you requested that your care partner not be given the details of your procedure findings, then the procedure report has been included in a sealed envelope for you to review at your convenience later.  YOU SHOULD EXPECT: Some feelings of bloating in the abdomen. Passage of more gas than usual.  Walking can help get rid of the air that was put into your GI tract during the procedure and reduce the bloating. If you had a lower endoscopy (such as a colonoscopy or flexible sigmoidoscopy) you may notice spotting of blood in your stool or on the toilet paper. If you underwent a bowel prep for your procedure, you may not have a normal bowel movement for a few days.  Please Note:  You might notice some irritation and congestion in your nose or some drainage.  This is from the oxygen used during your procedure.  There is no need for concern and it should clear up in a day or so.  SYMPTOMS TO REPORT IMMEDIATELY:   Following lower endoscopy (colonoscopy or flexible sigmoidoscopy):  Excessive amounts of blood in the stool  Significant tenderness or worsening of abdominal pains  Swelling of the abdomen that is new, acute  Fever of 100F or higher   For urgent or emergent issues, a gastroenterologist can be reached at any hour by calling [phone]. Do not use MyChart messaging for urgent concerns.    DIET:  We do recommend a small meal at first, but then you may proceed to your regular diet.  Drink plenty of fluids but you should avoid alcoholic beverages for 24 hours. Try to  increase the fiber in your diet, and drink plenty of water.  ACTIVITY:  You should plan to take it easy for the rest of today and you should NOT DRIVE or use heavy machinery until tomorrow (because of the sedation medicines used during the test).    FOLLOW UP: Our staff will call the number listed on your records 48-72 hours following your procedure to check on you and address any questions or concerns that you may have regarding the information given to you following your procedure. If we do not reach you, we will leave a message.  We will attempt to reach you two times.  During this call, we will ask if you have developed any symptoms of COVID 19. If you develop any symptoms (ie: fever, flu-like symptoms, shortness of breath, cough etc.) before then, please call [phone].  If you test positive for Covid 19 in the 2 weeks post procedure, please call and report this information to us.     SIGNATURES/CONFIDENTIALITY: You and/or your care partner have signed paperwork which will be entered into your electronic medical record.  These signatures attest to the fact that that the information above on your After Visit Summary has been reviewed and is understood.  Full responsibility of the confidentiality of this discharge information lies with you and/or your care-partner. 

## 2020-07-11 NOTE — Op Note (Signed)
[facility] Patient Name: [name] [name] Procedure Date: 07/11/2020 10:33 AM MRN: [id number] Endoscopist: Ladene Artist , MD Age: 69 Referring MD:  Date of Birth: 1951-01-07 Gender: Male Account #: [id number] Procedure:                Colonoscopy Indications:              Screening for colorectal malignant neoplasm Medicines:                Monitored Anesthesia Care Procedure:                Pre-Anesthesia Assessment:                           - Prior to the procedure, a History and Physical                            was performed, and patient medications and                            allergies were reviewed. The patient's tolerance of                            previous anesthesia was also reviewed. The risks                            and benefits of the procedure and the sedation                            options and risks were discussed with the patient.                            All questions were answered, and informed consent                            was obtained. Prior Anticoagulants: The patient has                            taken no previous anticoagulant or antiplatelet                            agents. ASA Grade Assessment: II - A patient with                            mild systemic disease. After reviewing the risks                            and benefits, the patient was deemed in                            satisfactory condition to undergo the procedure.                           After obtaining informed consent, the colonoscope  was passed under direct vision. Throughout the                            procedure, the patient's blood pressure, pulse, and                            oxygen saturations were monitored continuously. The                            Colonoscope was introduced through the anus and                            advanced to the the cecum, identified by                            appendiceal orifice and  ileocecal valve. The                            ileocecal valve, appendiceal orifice, and rectum                            were photographed. The quality of the bowel                            preparation was excellent. The colonoscopy was                            performed without difficulty. The patient tolerated                            the procedure well. Scope In: 10:46:25 AM Scope Out: 10:59:26 AM Scope Withdrawal Time: 0 hours 9 minutes 9 seconds  Total Procedure Duration: 0 hours 13 minutes 1 second  Findings:                 The perianal and digital rectal examinations were                            normal.                           Scattered medium-mouthed diverticula were found in                            the right colon. There was evidence of an impacted                            diverticulum. There was no evidence of diverticular                            bleeding.                           Multiple medium-mouthed diverticula were found in  the left colon. There was narrowing of the colon in                            association with the diverticular opening. There                            was evidence of diverticular spasm. There was no                            evidence of diverticular bleeding.                           Internal hemorrhoids were found during                            retroflexion. The hemorrhoids were medium-sized and                            Grade I (internal hemorrhoids that do not prolapse).                           The exam was otherwise without abnormality on                            direct and retroflexion views. Complications:            No immediate complications. Estimated blood loss:                            None. Estimated Blood Loss:     Estimated blood loss: none. Impression:               - Moderate diverticulosis in the right colon.                           - Moderate diverticulosis in the  left colon.                           - Internal hemorrhoids.                           - The examination was otherwise normal on direct                            and retroflexion views.                           - No specimens collected. Recommendation:           - Consider repeat colonoscopy in 10 years for                            screening purposes.                           - Patient has a contact number available for  emergencies. The signs and symptoms of potential                            delayed complications were discussed with the                            patient. Return to normal activities tomorrow.                            Written discharge instructions were provided to the                            patient.                           - High fiber diet.                           - Continue present medications. Ladene Artist, MD 07/11/2020 11:04:14 AM This report has been signed electronically.

## 2020-07-11 NOTE — Progress Notes (Signed)
Report given to PACU, vss 

## 2020-07-13 ENCOUNTER — Telehealth: Payer: Self-pay

## 2020-07-13 NOTE — Telephone Encounter (Signed)
  Follow up Call-  Call back number 07/11/2020  Post procedure Call Back phone  # [phone]  Permission to leave phone message Yes  Some recent data might be hidden     Patient questions:  Do you have a fever, pain , or abdominal swelling? No. Pain Score  0 *  Have you tolerated food without any problems? Yes.    Have you been able to return to your normal activities? Yes.    Do you have any questions about your discharge instructions: Diet   No. Medications  No. Follow up visit  No.  Do you have questions or concerns about your Care? No.  Actions: * If pain score is 4 or above: No action needed, pain <4.   1. Have you developed a fever since your procedure? no  2.   Have you had an respiratory symptoms (SOB or cough) since your procedure? no  3.   Have you tested positive for COVID 19 since your procedure no  4.   Have you had any family members/close contacts diagnosed with the COVID 19 since your procedure?  no   If yes to any of these questions please route to Joylene John, RN and Erenest Rasher, RN

## 2020-08-03 DIAGNOSIS — B029 Zoster without complications: Secondary | ICD-10-CM | POA: Diagnosis not present

## 2020-08-03 DIAGNOSIS — E663 Overweight: Secondary | ICD-10-CM | POA: Diagnosis not present

## 2020-08-03 DIAGNOSIS — Z6825 Body mass index (BMI) 25.0-25.9, adult: Secondary | ICD-10-CM | POA: Diagnosis not present

## 2020-08-04 ENCOUNTER — Other Ambulatory Visit: Payer: Self-pay

## 2020-08-04 ENCOUNTER — Ambulatory Visit (HOSPITAL_COMMUNITY): Payer: Medicare Other | Attending: Specialist | Admitting: Physical Therapy

## 2020-08-04 ENCOUNTER — Encounter (HOSPITAL_COMMUNITY): Payer: Self-pay | Admitting: Physical Therapy

## 2020-08-04 DIAGNOSIS — M25511 Pain in right shoulder: Secondary | ICD-10-CM | POA: Diagnosis not present

## 2020-08-04 DIAGNOSIS — R2689 Other abnormalities of gait and mobility: Secondary | ICD-10-CM | POA: Diagnosis not present

## 2020-08-04 DIAGNOSIS — M6281 Muscle weakness (generalized): Secondary | ICD-10-CM | POA: Diagnosis not present

## 2020-08-04 DIAGNOSIS — R29898 Other symptoms and signs involving the musculoskeletal system: Secondary | ICD-10-CM | POA: Diagnosis not present

## 2020-08-04 NOTE — Therapy (Signed)
[place], [place], [address] Phone: [phone]   Fax:  [phone]  Physical Therapy Evaluation  Patient Details  Name: [name] [name] MRN: [id number] Date of Birth: 12/19/1951 Referring Provider (PT): Debby Bud MD   Encounter Date: 08/04/2020   PT End of Session - 08/04/20 1204    Visit Number 1    Number of Visits 12    Date for PT Re-Evaluation 09/15/20    Authorization Type [facility] (15 visits max/aquatic therapy has also been approved)    Authorization - Visit Number 1    Authorization - Number of Visits 15    Progress Note Due on Visit 10    PT Start Time 1117    PT Stop Time 1200    PT Time Calculation (min) 43 min    Activity Tolerance Patient tolerated treatment well    Behavior During Therapy [facility] for tasks assessed/performed           Past Medical History:  Diagnosis Date  . Allergy   . Anxiety   . Arthritis    back pain  . Avascular necrosis of bone of hip ([place])    right  . CAD (coronary artery disease)    stents x3 2008  . GERD (gastroesophageal reflux disease)   . History of nuclear stress test    a. Myoview 10/17: EF 56%, diaphragmatic attenuation, no ischemia, low risk  . HTN (hypertension)   . Hyperlipidemia   . Myocardial infarction ([place])    2008- stents x 3   . Paresthesias   . Sleep apnea    no cpap   . Thyroid disease     Past Surgical History:  Procedure Laterality Date  . CARDIAC CATHETERIZATION     3 stents placed  . COLONOSCOPY  2011 NUR [facility] SCREENING d50 v5   MILD [place] TICS, ? PREP ARTIFACT V. PROCTITIS-rX:[place]. next TCS 2021.  . ESOPHAGEAL DILATION N/A 03/02/2016   Procedure: ESOPHAGEAL DILATION;  Surgeon: Danie Binder, MD;  [facility];  Service: Endoscopy;  Laterality: N/A;  . ESOPHAGOGASTRODUODENOSCOPY  10/03/11   small hiatal hernia/gastric ulcers/stricutre in distal esophagus  . ESOPHAGOGASTRODUODENOSCOPY N/A 03/02/2016   Dr. Oneida Alar: Schatzki's  ring at [place] junction s/p dilation, mild non-erosive gastritis/duodenitis   . ESOPHAGOGASTRODUODENOSCOPY (EGD) WITH PROPOFOL N/A 02/09/2020   Dr. Oneida Alar: Low-grade narrowing Schatzki ring status post dilation, moderate gastritis, no biopsies taken  . MASS EXCISION Left 09/30/2014   Procedure: EXCISION MASS LEFT WRIST ;  Surgeon: Daryll Brod, MD;  Location: [facility];  Service: Orthopedics;  Laterality: Left;  . ROTATOR CUFF REPAIR Left    [facility]   . SAVORY DILATION N/A 02/09/2020   Procedure: SAVORY DILATION;  Surgeon: Danie Binder, MD;  [facility];  Service: Endoscopy;  Laterality: N/A;  . TONSILLECTOMY  age 32  . TOTAL HIP ARTHROPLASTY  age 61   right hip for avascular necrosis of hip and spine  . TRIGGER FINGER RELEASE Left 03/08/2020   Procedure: RELEASE TRIGGER FINGER/A-1 PULLEY LEFT INDEX FINGER;  Surgeon: Daryll Brod, MD;  Location: [facility];  Service: Orthopedics;  Laterality: Left;  IV REGIONAL FOREARM BLOCK  . UPPER GASTROINTESTINAL ENDOSCOPY     with dilation - last 02-2020     There were no vitals filed for this visit.    Subjective Assessment - 08/04/20 1119    Subjective Patient is a 69 y.o. male who presents  to physical therapy with c/o muscle weakness. Patient states he is having some leg weakness as well as shoulder pain due to rotator cuff tear. Patient has been feeling this weakness over the last year. He has had to have right hip replacement due to AVN in 2000. He notices difficulty with strength and balance. Patient states his main goal is to decrease shoulder pain and lift legs up higher with walking. He has shoulder pain with lifting, at night and with overhead reaching.    Pertinent History CAD, HTN, HLD, MI 2008,    Limitations Walking;Lifting    Patient Stated Goals decrease shoulder pain and lift legs up higher with walking    Currently in Pain? No/denies              [facility] PT Assessment - 08/04/20 0001       Assessment   Medical Diagnosis Proximal Muscle Weakness    Referring Provider (PT) Debby Bud MD    Onset Date/Surgical Date 08/05/19    Next MD Visit Jan 2021    Prior Therapy Yes L shoulder, R Hip, low back      Precautions   Precautions None      Restrictions   Weight Bearing Restrictions No      Balance Screen   Has the patient fallen in the past 6 months No    Has the patient had a decrease in activity level because of a fear of falling?  No    Is the patient reluctant to leave their home because of a fear of falling?  No      Prior Function   Level of Independence Independent    Vocation Retired      Charity fundraiser Status Within Functional Limits for tasks assessed      Observation/Other Assessments   Observations Ambulates without AD    Focus on Therapeutic Outcomes (FOTO)  No FOTO      Posture/Postural Control   Posture/Postural Control Postural limitations    Postural Limitations Rounded Shoulders;Forward head      ROM / Strength   AROM / PROM / Strength AROM;Strength      AROM   AROM Assessment Site Shoulder    Right/Left Shoulder Right;Left    Right Shoulder Flexion 140 Degrees    Right Shoulder ABduction 135 Degrees   pain   Right Shoulder Internal Rotation --   T10   Right Shoulder External Rotation --   T3   Left Shoulder Flexion 152 Degrees    Left Shoulder ABduction 135 Degrees    Left Shoulder Internal Rotation --   T10   Left Shoulder External Rotation --   T3     Strength   Overall Strength Comments ER IR in neutral, pain in RUE  with ER, flexion and abduction    Strength Assessment Site Shoulder;Hip;Knee;Ankle    Right/Left Shoulder Right;Left    Right Shoulder Flexion 4-/5    Right Shoulder ABduction 3+/5    Right Shoulder Internal Rotation 4/5    Right Shoulder External Rotation 4-/5    Left Shoulder Flexion 4+/5    Left Shoulder ABduction 4+/5    Left Shoulder Internal Rotation 4+/5    Left Shoulder External Rotation  4+/5    Right/Left Hip Right;Left    Right Hip Flexion 3+/5    Left Hip Flexion 3+/5    Right/Left Knee Right;Left    Right Knee Flexion 4+/5    Right Knee Extension 4-/5  Left Knee Flexion 4+/5    Left Knee Extension 4-/5    Right/Left Ankle Right;Left    Right Ankle Dorsiflexion 4/5    Left Ankle Dorsiflexion 4/5      Transfers   Five time sit to stand comments  13.38 seconds    Comments slows with fatigue      Ambulation/Gait   Ambulation/Gait Yes    Ambulation/Gait Assistance 7: Independent    Ambulation Distance (Feet) 450 Feet    Assistive device None    Gait Pattern Step-through pattern;Poor foot clearance - left;Poor foot clearance - right;Trunk flexed    Stairs Yes    Stair Management Technique No rails    Number of Stairs 10    Height of Stairs 6    Gait Comments 2 MWT                      Objective measurements completed on examination: See above findings.       [facility] Adult PT Treatment/Exercise - 08/04/20 0001      Exercises   Exercises Knee/Hip      Knee/Hip Exercises: Seated   Sit to Sand 2 sets;10 reps;without UE support                  PT Education - 08/04/20 1130    Education Details Patient educated on exam findings, POC, scope of PT, inital HEP    Person(s) Educated Patient    Methods Explanation;Demonstration;Handout    Comprehension Verbalized understanding;Returned demonstration            PT Short Term Goals - 08/04/20 1212      PT SHORT TERM GOAL #1   Title Patient will be independent with HEP in order to improve functional outcomes.    Time 3    Period Weeks    Status New    Target Date 08/25/20      PT SHORT TERM GOAL #2   Title Patient will report at least 25% improvement in symptoms for improved quality of life.    Time 3    Period Weeks    Status New    Target Date 08/25/20             PT Long Term Goals - 08/04/20 1213      PT LONG TERM GOAL #1   Title Patient will report at least 75%  improvement in symptoms for improved quality of life.    Time 6    Period Weeks    Status New    Target Date 09/15/20      PT LONG TERM GOAL #2   Title Patient will be able to complete 5x STS in under 11.4 seconds in order to reduce the risk of falls.    Time 6    Period Weeks    Status New    Target Date 09/15/20      PT LONG TERM GOAL #3   Title Patient will improve bilateral LE and R shoulder strength by at least 1/2 grade in each plane assessed for improved strength with ADL.    Time 6    Period Weeks    Status New    Target Date 09/15/20                  Plan - 08/04/20 1206    Clinical Impression Statement Patient is a 69 y.o. male who presents to physical therapy with c/o muscle weakness and R shoulder pain. He  presents with pain limited deficits in with R shoulder with strength, ROM, and overhead reaching and deficits in LE strength, gait, balance, activity tolerance, and functional mobility with ADL. He is having to modify and restrict ADL as indicated by subjective information and objective measures which is affecting overall participation. Patient will benefit from skilled physical therapy in order to improve function and reduce impairment.    Personal Factors and Comorbidities Comorbidity 3+;Age;Time since onset of injury/illness/exacerbation;Past/Current Experience    Comorbidities CAD, HTN, HLD, MI 2008    Examination-Activity Limitations Carry;Lift;Locomotion Level;Squat;Stairs;Stand;Transfers    Examination-Participation Restrictions [employer];Shop;Community Activity    Stability/Clinical Decision Making Stable/Uncomplicated    Clinical Decision Making Low    Rehab Potential Good    PT Frequency 2x / week    PT Duration 6 weeks    PT Treatment/Interventions ADLs/Self Care Home Management;Aquatic Therapy;Biofeedback;Cryotherapy;Electrical Stimulation;Iontophoresis 4mg /ml Dexamethasone;Moist Heat;Traction;Ultrasound;Parrafin;DME Instruction;Gait  training;Functional mobility training;Therapeutic activities;Therapeutic exercise;Balance training;Neuromuscular re-education;Patient/family education;Orthotic Fit/Training;Manual techniques;Passive range of motion;Dry needling;Energy conservation;Splinting;Taping;Vasopneumatic Device;Spinal Manipulations;Joint Manipulations    PT Next Visit Plan begin LE strengthing with emphasis on hips and quads; possibly assess dynamic balance; Begin Rotator cuff strengthing for R shoulder with isometrics and progress as able    PT Home Exercise Plan 8/5 STS    Consulted and Agree with Plan of Care Patient           Patient will benefit from skilled therapeutic intervention in order to improve the following deficits and impairments:  Abnormal gait, Decreased endurance, Decreased activity tolerance, Decreased balance, Decreased range of motion, Decreased mobility, Decreased strength, Improper body mechanics, Pain, Impaired UE functional use  Visit Diagnosis: Muscle weakness (generalized)  Other abnormalities of gait and mobility  Other symptoms and signs involving the musculoskeletal system  Right shoulder pain, unspecified chronicity     Problem List Patient Active Problem List   Diagnosis Date Noted  . Abnormality of esophagus 01/27/2020  . Colon cancer screening 01/27/2020  . Esophageal dysphagia 02/08/2016  . Hemorrhoids 02/08/2016  . Chronic radicular cervical pain 07/29/2014  . GERD (gastroesophageal reflux disease) 10/14/2012  . Constipation 10/14/2012  . Stiffness of joint, not elsewhere classified, other specified site 02/21/2012  . PUD (peptic ulcer disease) 12/21/2011  . Dysphagia 07/25/2011  . Hyperlipidemia 08/12/2009  . ESSENTIAL HYPERTENSION, BENIGN 08/12/2009  . CAD, NATIVE VESSEL 08/12/2009  . CUTANEOUS ERUPTIONS, DRUG-INDUCED 01/09/2008  . LOCALIZED SUPERFICIAL SWELLING MASS OR LUMP 01/09/2008    12:17 PM, 08/04/20 Mearl Latin PT, DPT Physical Therapist at  [place], [place], [address] Phone: [phone]   Fax:  [phone]  Name: [name] [name] MRN: [id number] Date of Birth: 10/23/1951

## 2020-08-09 ENCOUNTER — Ambulatory Visit (HOSPITAL_COMMUNITY): Payer: Medicare Other

## 2020-08-09 ENCOUNTER — Encounter (HOSPITAL_COMMUNITY): Payer: Self-pay

## 2020-08-09 ENCOUNTER — Other Ambulatory Visit: Payer: Self-pay

## 2020-08-09 DIAGNOSIS — L089 Local infection of the skin and subcutaneous tissue, unspecified: Secondary | ICD-10-CM | POA: Diagnosis not present

## 2020-08-09 DIAGNOSIS — M25511 Pain in right shoulder: Secondary | ICD-10-CM | POA: Diagnosis not present

## 2020-08-09 DIAGNOSIS — Z681 Body mass index (BMI) 19 or less, adult: Secondary | ICD-10-CM | POA: Diagnosis not present

## 2020-08-09 DIAGNOSIS — M6281 Muscle weakness (generalized): Secondary | ICD-10-CM

## 2020-08-09 DIAGNOSIS — R2689 Other abnormalities of gait and mobility: Secondary | ICD-10-CM

## 2020-08-09 DIAGNOSIS — R29898 Other symptoms and signs involving the musculoskeletal system: Secondary | ICD-10-CM

## 2020-08-09 NOTE — Therapy (Signed)
[place], [place], [address] Phone: [phone]   Fax:  [phone]  Physical Therapy Treatment  Patient Details  Name: [name] [name] MRN: [id number] Date of Birth: Mar 02, 1951 Referring Provider (PT): Debby Bud MD   Encounter Date: 08/09/2020   PT End of Session - 08/09/20 1110    Visit Number 2    Number of Visits 12    Date for PT Re-Evaluation 09/15/20    Authorization Type [facility] (15 visits max/aquatic therapy has also been approved)    Authorization - Visit Number 2    Authorization - Number of Visits 15    Progress Note Due on Visit 10    PT Start Time 1100   Therapist late for apt   PT Stop Time 1138    PT Time Calculation (min) 38 min    Activity Tolerance Patient tolerated treatment well    Behavior During Therapy [facility] for tasks assessed/performed           Past Medical History:  Diagnosis Date   Allergy    Anxiety    Arthritis    back pain   Avascular necrosis of bone of hip ([place])    right   CAD (coronary artery disease)    stents x3 2008   GERD (gastroesophageal reflux disease)    History of nuclear stress test    a. Myoview 10/17: EF 56%, diaphragmatic attenuation, no ischemia, low risk   HTN (hypertension)    Hyperlipidemia    Myocardial infarction ([place])    2008- stents x 3    Paresthesias    Sleep apnea    no cpap    Thyroid disease     Past Surgical History:  Procedure Laterality Date   CARDIAC CATHETERIZATION     3 stents placed   COLONOSCOPY  2011 NUR [facility] SCREENING d50 v5   MILD [place] TICS, ? PREP ARTIFACT V. PROCTITIS-rX:[place]. next TCS 2021.   ESOPHAGEAL DILATION N/A 03/02/2016   Procedure: ESOPHAGEAL DILATION;  Surgeon: Danie Binder, MD;  [facility];  Service: Endoscopy;  Laterality: N/A;   ESOPHAGOGASTRODUODENOSCOPY  10/03/11   small hiatal hernia/gastric ulcers/stricutre in distal esophagus   ESOPHAGOGASTRODUODENOSCOPY N/A 03/02/2016    Dr. Oneida Alar: Schatzki's ring at [place] junction s/p dilation, mild non-erosive gastritis/duodenitis    ESOPHAGOGASTRODUODENOSCOPY (EGD) WITH PROPOFOL N/A 02/09/2020   Dr. Oneida Alar: Low-grade narrowing Schatzki ring status post dilation, moderate gastritis, no biopsies taken   MASS EXCISION Left 09/30/2014   Procedure: EXCISION MASS LEFT WRIST ;  Surgeon: Daryll Brod, MD;  Location: [place];  Service: Orthopedics;  Laterality: Left;   ROTATOR CUFF REPAIR Left    [facility]    SAVORY DILATION N/A 02/09/2020   Procedure: SAVORY DILATION;  Surgeon: Danie Binder, MD;  [facility];  Service: Endoscopy;  Laterality: N/A;   TONSILLECTOMY  age 69   TOTAL HIP ARTHROPLASTY  age 69   right hip for avascular necrosis of hip and spine   TRIGGER FINGER RELEASE Left 03/08/2020   Procedure: RELEASE TRIGGER FINGER/A-1 PULLEY LEFT INDEX FINGER;  Surgeon: Daryll Brod, MD;  Location: [place];  Service: Orthopedics;  Laterality: Left;  IV REGIONAL FOREARM BLOCK   UPPER GASTROINTESTINAL ENDOSCOPY     with dilation - last 02-2020     There were no vitals filed for this visit.   Subjective Assessment - 08/09/20 1102    Subjective Pt reports a spider  bite behind Lt calf that looks about the same, is making some improvements.  Has bite covered up and reports took last steroids earlier today.  Reports LBP does continue some.  No reoprts of recent falls.  Has began STS HEP daily.    Pertinent History CAD, HTN, HLD, MI 2008,    Patient Stated Goals decrease shoulder pain and lift legs up higher with walking    Currently in Pain? Yes    Pain Score 3     Pain Location Back    Pain Orientation Lower    Pain Descriptors / Indicators Sore    Pain Type Chronic pain    Pain Onset More than a month ago    Pain Frequency Constant    Aggravating Factors  straining    Pain Relieving Factors watching what he does, pillow between knees to sleep on side                              [facility] Adult PT Treatment/Exercise - 08/09/20 0001      Exercises   Exercises Knee/Hip      Knee/Hip Exercises: Standing   Heel Raises 10 reps;3 seconds    Forward Lunges Both;10 reps;3 seconds    Forward Lunges Limitations onto 4in step, min cueing for mechanics    Hip Abduction Both;10 reps;Knee straight    Abduction Limitations 5" holds RTB    Hip Extension Both;10 reps;Knee straight    Extension Limitations 5" holds RTB    Functional Squat 10 reps    Functional Squat Limitations front of chair, cueing for mechanics    Stairs 7in step height reciprocal pattern no HR    SLS Rt 12"; Lt 13" max     Other Standing Knee Exercises 2RT RTB sidestep                  PT Education - 08/09/20 1126    Education Details Reviewed goals, educated importance of compliance with HEP and assess mechanics.  Pt able to recall and demonstrate good mechanics without cueing required.    Person(s) Educated Patient    Methods Explanation;Demonstration    Comprehension Verbalized understanding;Returned demonstration            PT Short Term Goals - 08/04/20 1212      PT SHORT TERM GOAL #1   Title Patient will be independent with HEP in order to improve functional outcomes.    Time 3    Period Weeks    Status New    Target Date 08/25/20      PT SHORT TERM GOAL #2   Title Patient will report at least 25% improvement in symptoms for improved quality of life.    Time 3    Period Weeks    Status New    Target Date 08/25/20             PT Long Term Goals - 08/04/20 1213      PT LONG TERM GOAL #1   Title Patient will report at least 75% improvement in symptoms for improved quality of life.    Time 6    Period Weeks    Status New    Target Date 09/15/20      PT LONG TERM GOAL #2   Title Patient will be able to complete 5x STS in under 11.4 seconds in order to reduce the risk of falls.    Time 6  Period Weeks    Status New    Target  Date 09/15/20      PT LONG TERM GOAL #3   Title Patient will improve bilateral LE and R shoulder strength by at least 1/2 grade in each plane assessed for improved strength with ADL.    Time 6    Period Weeks    Status New    Target Date 09/15/20                 Plan - 08/09/20 1302    Clinical Impression Statement Reviewed goals, educated importance of HEP compliance, pt able to recall and demonstrated good eccentric control with current STS HEP.  Session focus on primary hip and quad strengthening.  Pt able to demonstrate good mechanics following demonstration and verbal cueing to improve form with new mechanics.  Able to add RTB around thighs during Abd and Extension activities wiht min cueing to reduce ER with movement.  No reports of pain through session.    Personal Factors and Comorbidities Comorbidity 3+;Age;Time since onset of injury/illness/exacerbation;Past/Current Experience    Comorbidities CAD, HTN, HLD, MI 2008    Examination-Activity Limitations Carry;Lift;Locomotion Level;Squat;Stairs;Stand;Transfers    Examination-Participation Restrictions [employer];Shop;Community Activity    Stability/Clinical Decision Making Stable/Uncomplicated    Clinical Decision Making Low    Rehab Potential Good    PT Frequency 2x / week    PT Duration 6 weeks    PT Treatment/Interventions ADLs/Self Care Home Management;Aquatic Therapy;Biofeedback;Cryotherapy;Electrical Stimulation;Iontophoresis 4mg /ml Dexamethasone;Moist Heat;Traction;Ultrasound;Parrafin;DME Instruction;Gait training;Functional mobility training;Therapeutic activities;Therapeutic exercise;Balance training;Neuromuscular re-education;Patient/family education;Orthotic Fit/Training;Manual techniques;Passive range of motion;Dry needling;Energy conservation;Splinting;Taping;Vasopneumatic Device;Spinal Manipulations;Joint Manipulations    PT Next Visit Plan begin LE strengthing with emphasis on hips and quads; possibly  assess dynamic balance; Begin Rotator cuff strengthing for R shoulder with isometrics and progress as able    PT Home Exercise Plan 8/5 STS           Patient will benefit from skilled therapeutic intervention in order to improve the following deficits and impairments:  Abnormal gait, Decreased endurance, Decreased activity tolerance, Decreased balance, Decreased range of motion, Decreased mobility, Decreased strength, Improper body mechanics, Pain, Impaired UE functional use  Visit Diagnosis: Muscle weakness (generalized)  Other abnormalities of gait and mobility  Other symptoms and signs involving the musculoskeletal system  Right shoulder pain, unspecified chronicity     Problem List Patient Active Problem List   Diagnosis Date Noted   Abnormality of esophagus 01/27/2020   Colon cancer screening 01/27/2020   Esophageal dysphagia 02/08/2016   Hemorrhoids 02/08/2016   Chronic radicular cervical pain 07/29/2014   GERD (gastroesophageal reflux disease) 10/14/2012   Constipation 10/14/2012   Stiffness of joint, not elsewhere classified, other specified site 02/21/2012   PUD (peptic ulcer disease) 12/21/2011   Dysphagia 07/25/2011   Hyperlipidemia 08/12/2009   ESSENTIAL HYPERTENSION, BENIGN 08/12/2009   CAD, NATIVE VESSEL 08/12/2009   CUTANEOUS ERUPTIONS, DRUG-INDUCED 01/09/2008   LOCALIZED SUPERFICIAL SWELLING MASS OR LUMP 01/09/2008   Ihor Austin, LPTA/CLT; CBIS [phone]  Aldona Lento 08/09/2020, 1:07 PM  [place] [address] [place], [place], [address] Phone: [phone]   Fax:  [phone]  Name: NAZIM KADLEC MRN: [id number] Date of Birth: February 24, 1951

## 2020-08-11 ENCOUNTER — Other Ambulatory Visit: Payer: Self-pay

## 2020-08-11 ENCOUNTER — Ambulatory Visit (HOSPITAL_COMMUNITY): Payer: Medicare Other

## 2020-08-11 ENCOUNTER — Encounter (HOSPITAL_COMMUNITY): Payer: Self-pay

## 2020-08-11 DIAGNOSIS — M6281 Muscle weakness (generalized): Secondary | ICD-10-CM | POA: Diagnosis not present

## 2020-08-11 DIAGNOSIS — M25511 Pain in right shoulder: Secondary | ICD-10-CM

## 2020-08-11 DIAGNOSIS — R29898 Other symptoms and signs involving the musculoskeletal system: Secondary | ICD-10-CM | POA: Diagnosis not present

## 2020-08-11 DIAGNOSIS — R2689 Other abnormalities of gait and mobility: Secondary | ICD-10-CM | POA: Diagnosis not present

## 2020-08-11 NOTE — Therapy (Signed)
[place], [place], [address] Phone: [phone]   Fax:  [phone]  Physical Therapy Treatment  Patient Details  Name: [name] [name] MRN: [id number] Date of Birth: [birth year] Referring Provider (PT): Debby Bud MD   Encounter Date: 08/11/2020   PT End of Session - 08/11/20 1127    Visit Number 3    Number of Visits 12    Date for PT Re-Evaluation 09/15/20    Authorization Type VA (15 visits max/aquatic therapy has also been approved)    Authorization - Visit Number 3    Authorization - Number of Visits 15    Progress Note Due on Visit 10    PT Start Time 1324    PT Stop Time 1127    PT Time Calculation (min) 40 min    Activity Tolerance Patient tolerated treatment well    Behavior During Therapy [facility] for tasks assessed/performed           Past Medical History:  Diagnosis Date  . Allergy   . Anxiety   . Arthritis    back pain  . Avascular necrosis of bone of hip ([place])    right  . CAD (coronary artery disease)    stents x3 2008  . GERD (gastroesophageal reflux disease)   . History of nuclear stress test    a. Myoview 10/17: EF 56%, diaphragmatic attenuation, no ischemia, low risk  . HTN (hypertension)   . Hyperlipidemia   . Myocardial infarction ([place])    2008- stents x 3   . Paresthesias   . Sleep apnea    no cpap   . Thyroid disease     Past Surgical History:  Procedure Laterality Date  . CARDIAC CATHETERIZATION     3 stents placed  . COLONOSCOPY  2011 NUR [facility] SCREENING d50 v5   MILD [place] TICS, ? PREP ARTIFACT V. PROCTITIS-rX:[place]. next TCS 2021.  . ESOPHAGEAL DILATION N/A 03/02/2016   Procedure: ESOPHAGEAL DILATION;  Surgeon: Danie Binder, MD;  [facility];  Service: Endoscopy;  Laterality: N/A;  . ESOPHAGOGASTRODUODENOSCOPY  10/03/11   small hiatal hernia/gastric ulcers/stricutre in distal esophagus  . ESOPHAGOGASTRODUODENOSCOPY N/A 03/02/2016   Dr. Oneida Alar: Schatzki's  ring at [place] junction s/p dilation, mild non-erosive gastritis/duodenitis   . ESOPHAGOGASTRODUODENOSCOPY (EGD) WITH PROPOFOL N/A 02/09/2020   Dr. Oneida Alar: Low-grade narrowing Schatzki ring status post dilation, moderate gastritis, no biopsies taken  . MASS EXCISION Left 09/30/2014   Procedure: EXCISION MASS LEFT WRIST ;  Surgeon: Daryll Brod, MD;  Location: [place];  Service: Orthopedics;  Laterality: Left;  . ROTATOR CUFF REPAIR Left    [facility]   . SAVORY DILATION N/A 02/09/2020   Procedure: SAVORY DILATION;  Surgeon: Danie Binder, MD;  [facility];  Service: Endoscopy;  Laterality: N/A;  . TONSILLECTOMY  age 69  . TOTAL HIP ARTHROPLASTY  age 69   right hip for avascular necrosis of hip and spine  . TRIGGER FINGER RELEASE Left 03/08/2020   Procedure: RELEASE TRIGGER FINGER/A-1 PULLEY LEFT INDEX FINGER;  Surgeon: Daryll Brod, MD;  Location: [place];  Service: Orthopedics;  Laterality: Left;  IV REGIONAL FOREARM BLOCK  . UPPER GASTROINTESTINAL ENDOSCOPY     with dilation - last 02-2020     There were no vitals filed for this visit.   Subjective Assessment - 08/11/20 1053    Subjective Pt reports LBP pain scale 3/10, Rt RTC pain  scale 2/10    Pertinent History CAD, HTN, HLD, MI 2008,    Currently in Pain? Yes    Pain Location Back    Pain Orientation Lower   center   Pain Descriptors / Indicators Sore    Pain Type Chronic pain    Pain Onset More than a month ago    Pain Frequency Constant    Aggravating Factors  straining    Pain Relieving Factors watching what he does, pillow between knees to sleep on side              [facility] PT Assessment - 08/11/20 0001      Assessment   Medical Diagnosis Proximal Muscle Weakness    Referring Provider (PT) Debby Bud MD    Onset Date/Surgical Date 08/05/19    Next MD Visit Jan 2022    Prior Therapy Yes L shoulder, R Hip, low back                         [facility] Adult PT  Treatment/Exercise - 08/11/20 0001      Knee/Hip Exercises: Standing   Functional Squat 10 reps    Functional Squat Limitations front of chair, cueing for mechanics    SLS R 14"; Lt 11"    Other Standing Knee Exercises RTB rows, shoulder extension and ER 10x 5" holds; Rt shoulder isometric for flexion, abd and extension 5x 5"    Other Standing Knee Exercises 2RT RTB sidestep, tandem stance 2x 30"                    PT Short Term Goals - 08/04/20 1212      PT SHORT TERM GOAL #1   Title Patient will be independent with HEP in order to improve functional outcomes.    Time 3    Period Weeks    Status New    Target Date 08/25/20      PT SHORT TERM GOAL #2   Title Patient will report at least 25% improvement in symptoms for improved quality of life.    Time 3    Period Weeks    Status New    Target Date 08/25/20             PT Long Term Goals - 08/04/20 1213      PT LONG TERM GOAL #1   Title Patient will report at least 75% improvement in symptoms for improved quality of life.    Time 6    Period Weeks    Status New    Target Date 09/15/20      PT LONG TERM GOAL #2   Title Patient will be able to complete 5x STS in under 11.4 seconds in order to reduce the risk of falls.    Time 6    Period Weeks    Status New    Target Date 09/15/20      PT LONG TERM GOAL #3   Title Patient will improve bilateral LE and R shoulder strength by at least 1/2 grade in each plane assessed for improved strength with ADL.    Time 6    Period Weeks    Status New    Target Date 09/15/20                 Plan - 08/11/20 1553    Clinical Impression Statement Began RTC strengthening in isometrics as well as postural strengthenig additional to POC.  Continued with hip strengthening exercises with additional theraband resistance during sidestep.  Added tandem stance with intermittent HHA required, cueing to improve focal awareness to assist with balance.    Personal Factors and  Comorbidities Comorbidity 3+;Age;Time since onset of injury/illness/exacerbation;Past/Current Experience    Comorbidities CAD, HTN, HLD, MI 2008    Examination-Activity Limitations Carry;Lift;Locomotion Level;Squat;Stairs;Stand;Transfers    Examination-Participation Restrictions [employer];Shop;Community Activity    Stability/Clinical Decision Making Stable/Uncomplicated    Clinical Decision Making Low    Rehab Potential Good    PT Frequency 2x / week    PT Duration 6 weeks    PT Treatment/Interventions ADLs/Self Care Home Management;Aquatic Therapy;Biofeedback;Cryotherapy;Electrical Stimulation;Iontophoresis 4mg /ml Dexamethasone;Moist Heat;Traction;Ultrasound;Parrafin;DME Instruction;Gait training;Functional mobility training;Therapeutic activities;Therapeutic exercise;Balance training;Neuromuscular re-education;Patient/family education;Orthotic Fit/Training;Manual techniques;Passive range of motion;Dry needling;Energy conservation;Splinting;Taping;Vasopneumatic Device;Spinal Manipulations;Joint Manipulations    PT Next Visit Plan Continue LE strengthing with emphasis on hips and quads; possibly assess dynamic balance;  Rotator cuff strengthing for R shoulder with isometrics and progress as able    PT Home Exercise Plan 8/5 STS; 08/11/20: SLS by counter or sink           Patient will benefit from skilled therapeutic intervention in order to improve the following deficits and impairments:  Abnormal gait, Decreased endurance, Decreased activity tolerance, Decreased balance, Decreased range of motion, Decreased mobility, Decreased strength, Improper body mechanics, Pain, Impaired UE functional use  Visit Diagnosis: Other symptoms and signs involving the musculoskeletal system  Right shoulder pain, unspecified chronicity  Other abnormalities of gait and mobility  Muscle weakness (generalized)     Problem List Patient Active Problem List   Diagnosis Date Noted  . Abnormality of  esophagus 01/27/2020  . Colon cancer screening 01/27/2020  . Esophageal dysphagia 02/08/2016  . Hemorrhoids 02/08/2016  . Chronic radicular cervical pain 07/29/2014  . GERD (gastroesophageal reflux disease) 10/14/2012  . Constipation 10/14/2012  . Stiffness of joint, not elsewhere classified, other specified site 02/21/2012  . PUD (peptic ulcer disease) 12/21/2011  . Dysphagia 07/25/2011  . Hyperlipidemia 08/12/2009  . ESSENTIAL HYPERTENSION, BENIGN 08/12/2009  . CAD, NATIVE VESSEL 08/12/2009  . CUTANEOUS ERUPTIONS, DRUG-INDUCED 01/09/2008  . LOCALIZED SUPERFICIAL SWELLING MASS OR LUMP 01/09/2008   Ihor Austin, LPTA/CLT; CBIS [phone]  Aldona Lento 08/11/2020, 4:03 PM  [facility] [address] [place], [place], [address] Phone: [phone]   Fax:  [phone]  Name: [name] [name] MRN: [id number] Date of Birth: 06-Feb-1951

## 2020-08-11 NOTE — Patient Instructions (Signed)
SINGLE LIMB STANCE    Stance: single leg on floor. Raise leg. Hold 30 seconds. Repeat with other leg. 5 reps per set, 1-2 sets per day, 5 days per week  Copyright  VHI. All rights reserved.

## 2020-08-16 ENCOUNTER — Other Ambulatory Visit: Payer: Self-pay

## 2020-08-16 ENCOUNTER — Ambulatory Visit (HOSPITAL_COMMUNITY): Payer: Medicare Other

## 2020-08-16 ENCOUNTER — Encounter (HOSPITAL_COMMUNITY): Payer: Self-pay

## 2020-08-16 DIAGNOSIS — M6281 Muscle weakness (generalized): Secondary | ICD-10-CM | POA: Diagnosis not present

## 2020-08-16 DIAGNOSIS — R2689 Other abnormalities of gait and mobility: Secondary | ICD-10-CM | POA: Diagnosis not present

## 2020-08-16 DIAGNOSIS — M25511 Pain in right shoulder: Secondary | ICD-10-CM | POA: Diagnosis not present

## 2020-08-16 DIAGNOSIS — R29898 Other symptoms and signs involving the musculoskeletal system: Secondary | ICD-10-CM | POA: Diagnosis not present

## 2020-08-16 NOTE — Therapy (Signed)
[place], [place], [address] Phone: [phone]   Fax:  [phone]  Physical Therapy Treatment  Patient Details  Name: [name] [name] MRN: [id number] Date of Birth: [birth year] Referring Provider (PT): Debby Bud MD   Encounter Date: 08/16/2020   PT End of Session - 08/16/20 1140    Visit Number 4    Number of Visits 12    Date for PT Re-Evaluation 09/15/20    Authorization Type [facility] (15 visits max/aquatic therapy has also been approved)    Authorization - Visit Number 4    Authorization - Number of Visits 15    Progress Note Due on Visit 10    PT Start Time 1135    PT Stop Time 1220    PT Time Calculation (min) 45 min    Activity Tolerance Patient tolerated treatment well    Behavior During Therapy [facility] for tasks assessed/performed           Past Medical History:  Diagnosis Date  . Allergy   . Anxiety   . Arthritis    back pain  . Avascular necrosis of bone of hip ([place])    right  . CAD (coronary artery disease)    stents x3 2008  . GERD (gastroesophageal reflux disease)   . History of nuclear stress test    a. Myoview 10/17: EF 56%, diaphragmatic attenuation, no ischemia, low risk  . HTN (hypertension)   . Hyperlipidemia   . Myocardial infarction ([place])    2008- stents x 3   . Paresthesias   . Sleep apnea    no cpap   . Thyroid disease     Past Surgical History:  Procedure Laterality Date  . CARDIAC CATHETERIZATION     3 stents placed  . COLONOSCOPY  2011 NUR [facility] SCREENING d50 v5   MILD [place] TICS, ? PREP ARTIFACT V. PROCTITIS-rX:[place]. next TCS 2021.  . ESOPHAGEAL DILATION N/A 03/02/2016   Procedure: ESOPHAGEAL DILATION;  Surgeon: Danie Binder, MD;  [facility];  Service: Endoscopy;  Laterality: N/A;  . ESOPHAGOGASTRODUODENOSCOPY  10/03/11   small hiatal hernia/gastric ulcers/stricutre in distal esophagus  . ESOPHAGOGASTRODUODENOSCOPY N/A 03/02/2016   Dr. Oneida Alar: Schatzki's  ring at [place] junction s/p dilation, mild non-erosive gastritis/duodenitis   . ESOPHAGOGASTRODUODENOSCOPY (EGD) WITH PROPOFOL N/A 02/09/2020   Dr. Oneida Alar: Low-grade narrowing Schatzki ring status post dilation, moderate gastritis, no biopsies taken  . MASS EXCISION Left 09/30/2014   Procedure: EXCISION MASS LEFT WRIST ;  Surgeon: Daryll Brod, MD;  Location: [place];  Service: Orthopedics;  Laterality: Left;  . ROTATOR CUFF REPAIR Left    [facility]   . SAVORY DILATION N/A 02/09/2020   Procedure: SAVORY DILATION;  Surgeon: Danie Binder, MD;  [facility];  Service: Endoscopy;  Laterality: N/A;  . TONSILLECTOMY  age 1  . TOTAL HIP ARTHROPLASTY  age 20   right hip for avascular necrosis of hip and spine  . TRIGGER FINGER RELEASE Left 03/08/2020   Procedure: RELEASE TRIGGER FINGER/A-1 PULLEY LEFT INDEX FINGER;  Surgeon: Daryll Brod, MD;  Location: [facility];  Service: Orthopedics;  Laterality: Left;  IV REGIONAL FOREARM BLOCK  . UPPER GASTROINTESTINAL ENDOSCOPY     with dilation - last 02-2020     There were no vitals filed for this visit.   Subjective Assessment - 08/16/20 1138    Subjective Pt stated he is feeling good. LBP pain scale  3/10.    Pertinent History CAD, HTN, HLD, MI 2008,    Patient Stated Goals decrease shoulder pain and lift legs up higher with walking    Currently in Pain? Yes    Pain Score 3     Pain Location Back    Pain Orientation Lower    Pain Descriptors / Indicators Sore    Pain Type Chronic pain    Pain Onset More than a month ago    Pain Frequency Constant                             [facility] Adult PT Treatment/Exercise - 08/16/20 0001      Knee/Hip Exercises: Standing   Hip Flexion Both;10 reps    Hip Flexion Limitations marching alternating    Functional Squat 2 sets;10 reps    Functional Squat Limitations good mechanics    SLS Lt 16" Rt 18"    SLS with Vectors 3x 5"    Other Standing Knee  Exercises RTB rows, shoulder extension and ER 10x 5" holds; Rt shoulder isometric ER walkout 5RT    Other Standing Knee Exercises 2RT RTB sidestep, tandem stance 2x 30" on foam                    PT Short Term Goals - 08/04/20 1212      PT SHORT TERM GOAL #1   Title Patient will be independent with HEP in order to improve functional outcomes.    Time 3    Period Weeks    Status New    Target Date 08/25/20      PT SHORT TERM GOAL #2   Title Patient will report at least 25% improvement in symptoms for improved quality of life.    Time 3    Period Weeks    Status New    Target Date 08/25/20             PT Long Term Goals - 08/04/20 1213      PT LONG TERM GOAL #1   Title Patient will report at least 75% improvement in symptoms for improved quality of life.    Time 6    Period Weeks    Status New    Target Date 09/15/20      PT LONG TERM GOAL #2   Title Patient will be able to complete 5x STS in under 11.4 seconds in order to reduce the risk of falls.    Time 6    Period Weeks    Status New    Target Date 09/15/20      PT LONG TERM GOAL #3   Title Patient will improve bilateral LE and R shoulder strength by at least 1/2 grade in each plane assessed for improved strength with ADL.    Time 6    Period Weeks    Status New    Target Date 09/15/20                 Plan - 08/16/20 1215    Clinical Impression Statement Progressed RTC strengthening with isometric walk out.  Assessed static balance activities that were more challenging on dynamic surface required HHA.  Progressed hip strengthening with additional vector stance and reduced HHA with squats wtih good mechanics noted.  No reports of increased pain through session.    Personal Factors and Comorbidities Comorbidity 3+;Age;Time since onset of injury/illness/exacerbation;Past/Current Experience    Comorbidities  CAD, HTN, HLD, MI 2008    Examination-Activity Limitations Carry;Lift;Locomotion  Level;Squat;Stairs;Stand;Transfers    Examination-Participation Restrictions [employer];Shop;Community Activity    Stability/Clinical Decision Making Stable/Uncomplicated    Clinical Decision Making Low    Rehab Potential Good    PT Frequency 2x / week    PT Duration 6 weeks    PT Treatment/Interventions ADLs/Self Care Home Management;Aquatic Therapy;Biofeedback;Cryotherapy;Electrical Stimulation;Iontophoresis 4mg /ml Dexamethasone;Moist Heat;Traction;Ultrasound;Parrafin;DME Instruction;Gait training;Functional mobility training;Therapeutic activities;Therapeutic exercise;Balance training;Neuromuscular re-education;Patient/family education;Orthotic Fit/Training;Manual techniques;Passive range of motion;Dry needling;Energy conservation;Splinting;Taping;Vasopneumatic Device;Spinal Manipulations;Joint Manipulations    PT Next Visit Plan Continue LE strengthing with emphasis on hips and quads; possibly assess dynamic balance;  Rotator cuff strengthing for R shoulder with isometrics and progress as able    PT Home Exercise Plan 8/5 STS; 08/11/20: SLS by counter or sink; 8/17: standing marching, hip abd and ext by counter           Patient will benefit from skilled therapeutic intervention in order to improve the following deficits and impairments:  Abnormal gait, Decreased endurance, Decreased activity tolerance, Decreased balance, Decreased range of motion, Decreased mobility, Decreased strength, Improper body mechanics, Pain, Impaired UE functional use  Visit Diagnosis: Other abnormalities of gait and mobility  Muscle weakness (generalized)  Right shoulder pain, unspecified chronicity  Other symptoms and signs involving the musculoskeletal system     Problem List Patient Active Problem List   Diagnosis Date Noted  . Abnormality of esophagus 01/27/2020  . Colon cancer screening 01/27/2020  . Esophageal dysphagia 02/08/2016  . Hemorrhoids 02/08/2016  . Chronic radicular cervical  pain 07/29/2014  . GERD (gastroesophageal reflux disease) 10/14/2012  . Constipation 10/14/2012  . Stiffness of joint, not elsewhere classified, other specified site 02/21/2012  . PUD (peptic ulcer disease) 12/21/2011  . Dysphagia 07/25/2011  . Hyperlipidemia 08/12/2009  . ESSENTIAL HYPERTENSION, BENIGN 08/12/2009  . CAD, NATIVE VESSEL 08/12/2009  . CUTANEOUS ERUPTIONS, DRUG-INDUCED 01/09/2008  . LOCALIZED SUPERFICIAL SWELLING MASS OR LUMP 01/09/2008   Ihor Austin, LPTA/CLT; CBIS [phone]  Aldona Lento 08/16/2020, 12:55 PM  [facility] [address] [place], [place], [address] Phone: [phone]   Fax:  [phone]  Name: JOAH PATLAN MRN: [id number] Date of Birth: 11/06/1951

## 2020-08-16 NOTE — Patient Instructions (Signed)
Marching In-Place    Standing straight, alternate bringing knees toward trunk. Bent arms swing alternately. Do 2 sets. Do 10 reps per day.  Copyright  VHI. All rights reserved.   ABDUCTION: Standing (Active)    Stand, feet flat. Lift right leg out to side.  Complete 2 sets of 10 repetitions.   http://gtsc.exer.us/110   Copyright  VHI. All rights reserved.   Hip Extension (Standing)    Stand with support. Squeeze pelvic floor and hold. Move right leg backward with straight knee.  Hold for 5 seconds.  Repeat 10 times. Do 2 times a day. Repeat with other leg.    Copyright  VHI. All rights reserved.

## 2020-08-24 ENCOUNTER — Other Ambulatory Visit: Payer: Self-pay

## 2020-08-24 ENCOUNTER — Ambulatory Visit (HOSPITAL_COMMUNITY): Payer: Medicare Other | Admitting: Physical Therapy

## 2020-08-24 ENCOUNTER — Encounter (HOSPITAL_COMMUNITY): Payer: Self-pay | Admitting: Physical Therapy

## 2020-08-24 DIAGNOSIS — M25511 Pain in right shoulder: Secondary | ICD-10-CM | POA: Diagnosis not present

## 2020-08-24 DIAGNOSIS — M6281 Muscle weakness (generalized): Secondary | ICD-10-CM

## 2020-08-24 DIAGNOSIS — R29898 Other symptoms and signs involving the musculoskeletal system: Secondary | ICD-10-CM

## 2020-08-24 DIAGNOSIS — R2689 Other abnormalities of gait and mobility: Secondary | ICD-10-CM | POA: Diagnosis not present

## 2020-08-24 NOTE — Therapy (Signed)
[place], [place], [address] Phone: [phone]   Fax:  [phone]  Physical Therapy Treatment  Patient Details  Name: RIELEY HAUSMAN MRN: [id number] Date of Birth: [birth year] Referring Provider (PT): Debby Bud MD   Encounter Date: 08/24/2020   PT End of Session - 08/24/20 0948    Visit Number 5    Number of Visits 12    Date for PT Re-Evaluation 09/15/20    Authorization Type VA (15 visits max/aquatic therapy has also been approved)    Authorization - Visit Number 5    Authorization - Number of Visits 15    Progress Note Due on Visit 10    PT Start Time [id number]    PT Stop Time 1027    PT Time Calculation (min) 39 min    Activity Tolerance Patient tolerated treatment well    Behavior During Therapy [facility] for tasks assessed/performed           Past Medical History:  Diagnosis Date  . Allergy   . Anxiety   . Arthritis    back pain  . Avascular necrosis of bone of hip ([place])    right  . CAD (coronary artery disease)    stents x3 2008  . GERD (gastroesophageal reflux disease)   . History of nuclear stress test    a. Myoview 10/17: EF 56%, diaphragmatic attenuation, no ischemia, low risk  . HTN (hypertension)   . Hyperlipidemia   . Myocardial infarction ([place])    2008- stents x 3   . Paresthesias   . Sleep apnea    no cpap   . Thyroid disease     Past Surgical History:  Procedure Laterality Date  . CARDIAC CATHETERIZATION     3 stents placed  . COLONOSCOPY  2011 NUR [facility] SCREENING d50 v5   MILD [place] TICS, ? PREP ARTIFACT V. PROCTITIS-rX:[place]. next TCS 2021.  . ESOPHAGEAL DILATION N/A 03/02/2016   Procedure: ESOPHAGEAL DILATION;  Surgeon: Danie Binder, MD;  [facility];  Service: Endoscopy;  Laterality: N/A;  . ESOPHAGOGASTRODUODENOSCOPY  10/03/11   small hiatal hernia/gastric ulcers/stricutre in distal esophagus  . ESOPHAGOGASTRODUODENOSCOPY N/A 03/02/2016   Dr. Oneida Alar: Schatzki's  ring at [place] junction s/p dilation, mild non-erosive gastritis/duodenitis   . ESOPHAGOGASTRODUODENOSCOPY (EGD) WITH PROPOFOL N/A 02/09/2020   Dr. Oneida Alar: Low-grade narrowing Schatzki ring status post dilation, moderate gastritis, no biopsies taken  . MASS EXCISION Left 09/30/2014   Procedure: EXCISION MASS LEFT WRIST ;  Surgeon: Daryll Brod, MD;  Location: [facility];  Service: Orthopedics;  Laterality: Left;  . ROTATOR CUFF REPAIR Left    [facility]   . SAVORY DILATION N/A 02/09/2020   Procedure: SAVORY DILATION;  Surgeon: Danie Binder, MD;  [facility];  Service: Endoscopy;  Laterality: N/A;  . TONSILLECTOMY  age 12  . TOTAL HIP ARTHROPLASTY  age 3   right hip for avascular necrosis of hip and spine  . TRIGGER FINGER RELEASE Left 03/08/2020   Procedure: RELEASE TRIGGER FINGER/A-1 PULLEY LEFT INDEX FINGER;  Surgeon: Daryll Brod, MD;  Location: [facility];  Service: Orthopedics;  Laterality: Left;  IV REGIONAL FOREARM BLOCK  . UPPER GASTROINTESTINAL ENDOSCOPY     with dilation - last 02-2020     There were no vitals filed for this visit.   Subjective Assessment - 08/24/20 0947    Subjective Patient states he has been feeling alright. He feels  stiff in his shoulders and neck. Patient states he has been mowing a lot and he has not been able to do exercises much.    Pertinent History CAD, HTN, HLD, MI 2008,    Patient Stated Goals decrease shoulder pain and lift legs up higher with walking    Currently in Pain? Yes    Pain Score 3     Pain Location Shoulder    Pain Onset More than a month ago                             [facility] Adult PT Treatment/Exercise - 08/24/20 0001      Knee/Hip Exercises: Standing   Hip Abduction Both;10 reps;Knee straight    Abduction Limitations 5" holds RTB    Lateral Step Up Both;2 sets;10 reps;Hand Hold: 2;Step Height: 6"    Lateral Step Up Limitations eccentric control    Forward Step Up Both;1 set;10  reps;Hand Hold: 1;Step Height: 6"    Forward Step Up Limitations with contralateral knee drive    Functional Squat 10 reps;3 sets    Functional Squat Limitations good mechanics                  PT Education - 08/24/20 0947    Education Details Patient educated on HEP, mechanics of exercise    Person(s) Educated Patient    Methods Explanation;Demonstration    Comprehension Verbalized understanding;Returned demonstration            PT Short Term Goals - 08/24/20 1008      PT SHORT TERM GOAL #1   Title Patient will be independent with HEP in order to improve functional outcomes.    Time 3    Period Weeks    Status On-going    Target Date 08/25/20      PT SHORT TERM GOAL #2   Title Patient will report at least 25% improvement in symptoms for improved quality of life.    Time 3    Period Weeks    Status On-going    Target Date 08/25/20             PT Long Term Goals - 08/24/20 1008      PT LONG TERM GOAL #1   Title Patient will report at least 75% improvement in symptoms for improved quality of life.    Time 6    Period Weeks    Status On-going      PT LONG TERM GOAL #2   Title Patient will be able to complete 5x STS in under 11.4 seconds in order to reduce the risk of falls.    Time 6    Period Weeks    Status On-going      PT LONG TERM GOAL #3   Title Patient will improve bilateral LE and R shoulder strength by at least 1/2 grade in each plane assessed for improved strength with ADL.    Time 6    Period Weeks    Status On-going                 Plan - 08/24/20 0948    Clinical Impression Statement Today focused on LE strengthening but will continue to work on shoulder strength and mobility as well. Patient requires some verbal cueing for posture with standing hip exercises. Patient requires intermittent UE support with step up with contralateral knee drive secondary to impaired balance. Patient uses bilateral UE support  with lateral step down  with emphasis on eccentric control secondary to impaired eccentric strength and quad/hip abductor motor control with R>L difficulty. Patient demonstrates good squatting mechanics following initial demonstration with cueing for bending through hips.  Patient will continue to benefit from skilled physical therapy in order reduce impairment and improve function.    Personal Factors and Comorbidities Comorbidity 3+;Age;Time since onset of injury/illness/exacerbation;Past/Current Experience    Comorbidities CAD, HTN, HLD, MI 2008    Examination-Activity Limitations Carry;Lift;Locomotion Level;Squat;Stairs;Stand;Transfers    Examination-Participation Restrictions [employer];Shop;Community Activity    Stability/Clinical Decision Making Stable/Uncomplicated    Rehab Potential Good    PT Frequency 2x / week    PT Duration 6 weeks    PT Treatment/Interventions ADLs/Self Care Home Management;Aquatic Therapy;Biofeedback;Cryotherapy;Electrical Stimulation;Iontophoresis 4mg /ml Dexamethasone;Moist Heat;Traction;Ultrasound;Parrafin;DME Instruction;Gait training;Functional mobility training;Therapeutic activities;Therapeutic exercise;Balance training;Neuromuscular re-education;Patient/family education;Orthotic Fit/Training;Manual techniques;Passive range of motion;Dry needling;Energy conservation;Splinting;Taping;Vasopneumatic Device;Spinal Manipulations;Joint Manipulations    PT Next Visit Plan Continue LE strengthing with emphasis on hips and quads; possibly assess dynamic balance;  Rotator cuff strengthing for R shoulder with isometrics and progress as able, possibly focus on shoulder next session    PT Home Exercise Plan 8/5 STS; 08/11/20: SLS by counter or sink; 8/17: standing marching, hip abd and ext by counter           Patient will benefit from skilled therapeutic intervention in order to improve the following deficits and impairments:  Abnormal gait, Decreased endurance, Decreased activity  tolerance, Decreased balance, Decreased range of motion, Decreased mobility, Decreased strength, Improper body mechanics, Pain, Impaired UE functional use  Visit Diagnosis: Other abnormalities of gait and mobility  Muscle weakness (generalized)  Right shoulder pain, unspecified chronicity  Other symptoms and signs involving the musculoskeletal system     Problem List Patient Active Problem List   Diagnosis Date Noted  . Abnormality of esophagus 01/27/2020  . Colon cancer screening 01/27/2020  . Esophageal dysphagia 02/08/2016  . Hemorrhoids 02/08/2016  . Chronic radicular cervical pain 07/29/2014  . GERD (gastroesophageal reflux disease) 10/14/2012  . Constipation 10/14/2012  . Stiffness of joint, not elsewhere classified, other specified site 02/21/2012  . PUD (peptic ulcer disease) 12/21/2011  . Dysphagia 07/25/2011  . Hyperlipidemia 08/12/2009  . ESSENTIAL HYPERTENSION, BENIGN 08/12/2009  . CAD, NATIVE VESSEL 08/12/2009  . CUTANEOUS ERUPTIONS, DRUG-INDUCED 01/09/2008  . LOCALIZED SUPERFICIAL SWELLING MASS OR LUMP 01/09/2008    10:30 AM, 08/24/20 Mearl Latin PT, DPT Physical Therapist at [facility], [place], [address] Phone: [phone]   Fax:  [phone]  Name: RIDDIK SENNA MRN: [id number] Date of Birth: [birth year]

## 2020-08-26 ENCOUNTER — Other Ambulatory Visit: Payer: Self-pay

## 2020-08-26 ENCOUNTER — Encounter (HOSPITAL_COMMUNITY): Payer: Self-pay

## 2020-08-26 ENCOUNTER — Ambulatory Visit (HOSPITAL_COMMUNITY): Payer: Medicare Other

## 2020-08-26 DIAGNOSIS — R29898 Other symptoms and signs involving the musculoskeletal system: Secondary | ICD-10-CM | POA: Diagnosis not present

## 2020-08-26 DIAGNOSIS — M25511 Pain in right shoulder: Secondary | ICD-10-CM

## 2020-08-26 DIAGNOSIS — M6281 Muscle weakness (generalized): Secondary | ICD-10-CM

## 2020-08-26 DIAGNOSIS — R2689 Other abnormalities of gait and mobility: Secondary | ICD-10-CM

## 2020-08-26 NOTE — Therapy (Signed)
[place], [place], [address] Phone: [phone]   Fax:  [phone]  Physical Therapy Treatment  Patient Details  Name: [name] [name] MRN: [id number] Date of Birth: 11/11/1951 Referring Provider (PT): Debby Bud MD   Encounter Date: 08/26/2020   PT End of Session - 08/26/20 0946    Visit Number 6    Number of Visits 12    Date for PT Re-Evaluation 09/15/20    Authorization Type [facility] (15 visits max/aquatic therapy has also been approved)    Authorization - Visit Number 6    Authorization - Number of Visits 15    Progress Note Due on Visit 10    PT Start Time [phone]    PT Stop Time 1026    PT Time Calculation (min) 39 min    Activity Tolerance Patient tolerated treatment well    Behavior During Therapy [facility] for tasks assessed/performed           Past Medical History:  Diagnosis Date  . Allergy   . Anxiety   . Arthritis    back pain  . Avascular necrosis of bone of hip ([place])    right  . CAD (coronary artery disease)    stents x3 2008  . GERD (gastroesophageal reflux disease)   . History of nuclear stress test    a. Myoview 10/17: EF 56%, diaphragmatic attenuation, no ischemia, low risk  . HTN (hypertension)   . Hyperlipidemia   . Myocardial infarction ([place])    2008- stents x 3   . Paresthesias   . Sleep apnea    no cpap   . Thyroid disease     Past Surgical History:  Procedure Laterality Date  . CARDIAC CATHETERIZATION     3 stents placed  . COLONOSCOPY  2011 NUR [facility] SCREENING d50 v5   MILD [place] TICS, ? PREP ARTIFACT V. PROCTITIS-rX:[place]. next TCS 2021.  . ESOPHAGEAL DILATION N/A 03/02/2016   Procedure: ESOPHAGEAL DILATION;  Surgeon: Danie Binder, MD;  [facility];  Service: Endoscopy;  Laterality: N/A;  . ESOPHAGOGASTRODUODENOSCOPY  10/03/11   small hiatal hernia/gastric ulcers/stricutre in distal esophagus  . ESOPHAGOGASTRODUODENOSCOPY N/A 03/02/2016   Dr. Oneida Alar: Schatzki's  ring at [employer] junction s/p dilation, mild non-erosive gastritis/duodenitis   . ESOPHAGOGASTRODUODENOSCOPY (EGD) WITH PROPOFOL N/A 02/09/2020   Dr. Oneida Alar: Low-grade narrowing Schatzki ring status post dilation, moderate gastritis, no biopsies taken  . MASS EXCISION Left 09/30/2014   Procedure: EXCISION MASS LEFT WRIST ;  Surgeon: Daryll Brod, MD;  Location: [facility];  Service: Orthopedics;  Laterality: Left;  . ROTATOR CUFF REPAIR Left    [facility]   . SAVORY DILATION N/A 02/09/2020   Procedure: SAVORY DILATION;  Surgeon: Danie Binder, MD;  [facility];  Service: Endoscopy;  Laterality: N/A;  . TONSILLECTOMY  age 13  . TOTAL HIP ARTHROPLASTY  age 45   right hip for avascular necrosis of hip and spine  . TRIGGER FINGER RELEASE Left 03/08/2020   Procedure: RELEASE TRIGGER FINGER/A-1 PULLEY LEFT INDEX FINGER;  Surgeon: Daryll Brod, MD;  Location: [place];  Service: Orthopedics;  Laterality: Left;  IV REGIONAL FOREARM BLOCK  . UPPER GASTROINTESTINAL ENDOSCOPY     with dilation - last 02-2020     There were no vitals filed for this visit.   Subjective Assessment - 08/26/20 0947    Subjective Pt reports not too much soreness after last session.  Pt without complaints.    Pertinent History CAD, HTN, HLD, MI 2008,    Patient Stated Goals decrease shoulder pain and lift legs up higher with walking    Currently in Pain? No/denies    Pain Onset More than a month ago                  [facility] Adult PT Treatment/Exercise - 08/26/20 0001      Knee/Hip Exercises: Standing   Heel Raises 15 reps    Heel Raises Limitations toes forward, toes out, toes in each    Lateral Step Up Both;15 reps;Hand Hold: 2;Step Height: 6"    Lateral Step Up Limitations eccentric control    Forward Step Up Both;2 sets;10 reps;Step Height: 2";Step Height: 6"    Forward Step Up Limitations eccentric control, bag leg only taps floor    Other Standing Knee Exercises GTB rows, 15  reps; shoulder protraction/retraction, standing without resistance, 15 reps    Other Standing Knee Exercises shoulder isometrics against wall (flexion, IR, ER, extension), x15 reps each, 3-5 sec hold, no pain                  PT Education - 08/26/20 0950    Education Details Exercise technique, continue HEP    Person(s) Educated Patient    Methods Explanation;Demonstration    Comprehension Verbalized understanding;Returned demonstration            PT Short Term Goals - 08/24/20 1008      PT SHORT TERM GOAL #1   Title Patient will be independent with HEP in order to improve functional outcomes.    Time 3    Period Weeks    Status On-going    Target Date 08/25/20      PT SHORT TERM GOAL #2   Title Patient will report at least 25% improvement in symptoms for improved quality of life.    Time 3    Period Weeks    Status On-going    Target Date 08/25/20             PT Long Term Goals - 08/24/20 1008      PT LONG TERM GOAL #1   Title Patient will report at least 75% improvement in symptoms for improved quality of life.    Time 6    Period Weeks    Status On-going      PT LONG TERM GOAL #2   Title Patient will be able to complete 5x STS in under 11.4 seconds in order to reduce the risk of falls.    Time 6    Period Weeks    Status On-going      PT LONG TERM GOAL #3   Title Patient will improve bilateral LE and R shoulder strength by at least 1/2 grade in each plane assessed for improved strength with ADL.    Time 6    Period Weeks    Status On-going                 Plan - 08/26/20 0946    Clinical Impression Statement Pt unable to perform shoulder protraction/retraction due to pain in R shoulder. Implemented shoulder isometrics in all directions with good contraction and no pain. Attempted shoulder protraction/retraction at wall, but unable to coordinate; pt performs shoulder protraction/retraction standing without resistance with good activation  with heavy cues. Continued with BLE strengthening exercises, cues for pressing through forefoot for quad activation and heel to activate HS with  good carryover. Continue to progress as able.    Personal Factors and Comorbidities Comorbidity 3+;Age;Time since onset of injury/illness/exacerbation;Past/Current Experience    Comorbidities CAD, HTN, HLD, MI 2008    Examination-Activity Limitations Carry;Lift;Locomotion Level;Squat;Stairs;Stand;Transfers    Examination-Participation Restrictions [employer];Shop;Community Activity    Stability/Clinical Decision Making Stable/Uncomplicated    Rehab Potential Good    PT Frequency 2x / week    PT Duration 6 weeks    PT Treatment/Interventions ADLs/Self Care Home Management;Aquatic Therapy;Biofeedback;Cryotherapy;Electrical Stimulation;Iontophoresis 4mg /ml Dexamethasone;Moist Heat;Traction;Ultrasound;Parrafin;DME Instruction;Gait training;Functional mobility training;Therapeutic activities;Therapeutic exercise;Balance training;Neuromuscular re-education;Patient/family education;Orthotic Fit/Training;Manual techniques;Passive range of motion;Dry needling;Energy conservation;Splinting;Taping;Vasopneumatic Device;Spinal Manipulations;Joint Manipulations    PT Next Visit Plan Continue LE strengthing with emphasis on hips and quads; possibly assess dynamic balance;  Rotator cuff strengthing for R shoulder with isometrics and progress as able    PT Home Exercise Plan 8/5 STS; 08/11/20: SLS by counter or sink; 8/17: standing marching, hip abd and ext by counter    Consulted and Agree with Plan of Care Patient           Patient will benefit from skilled therapeutic intervention in order to improve the following deficits and impairments:  Abnormal gait, Decreased endurance, Decreased activity tolerance, Decreased balance, Decreased range of motion, Decreased mobility, Decreased strength, Improper body mechanics, Pain, Impaired UE functional use  Visit  Diagnosis: Other abnormalities of gait and mobility  Muscle weakness (generalized)  Right shoulder pain, unspecified chronicity  Other symptoms and signs involving the musculoskeletal system     Problem List Patient Active Problem List   Diagnosis Date Noted  . Abnormality of esophagus 01/27/2020  . Colon cancer screening 01/27/2020  . Esophageal dysphagia 02/08/2016  . Hemorrhoids 02/08/2016  . Chronic radicular cervical pain 07/29/2014  . GERD (gastroesophageal reflux disease) 10/14/2012  . Constipation 10/14/2012  . Stiffness of joint, not elsewhere classified, other specified site 02/21/2012  . PUD (peptic ulcer disease) 12/21/2011  . Dysphagia 07/25/2011  . Hyperlipidemia 08/12/2009  . ESSENTIAL HYPERTENSION, BENIGN 08/12/2009  . CAD, NATIVE VESSEL 08/12/2009  . CUTANEOUS ERUPTIONS, DRUG-INDUCED 01/09/2008  . LOCALIZED SUPERFICIAL SWELLING MASS OR LUMP 01/09/2008     Talbot Grumbling PT, DPT 08/26/20, 10:27 AM [facility] [address] [place], [place], [address] Phone: [phone]   Fax:  [phone]  Name: [name] [name] MRN: [id number] Date of Birth: 1951/08/26

## 2020-08-30 ENCOUNTER — Encounter (HOSPITAL_COMMUNITY): Payer: Self-pay

## 2020-08-30 ENCOUNTER — Ambulatory Visit (HOSPITAL_COMMUNITY): Payer: Medicare Other

## 2020-08-30 ENCOUNTER — Other Ambulatory Visit: Payer: Self-pay

## 2020-08-30 DIAGNOSIS — M25511 Pain in right shoulder: Secondary | ICD-10-CM

## 2020-08-30 DIAGNOSIS — R29898 Other symptoms and signs involving the musculoskeletal system: Secondary | ICD-10-CM

## 2020-08-30 DIAGNOSIS — M6281 Muscle weakness (generalized): Secondary | ICD-10-CM

## 2020-08-30 DIAGNOSIS — R2689 Other abnormalities of gait and mobility: Secondary | ICD-10-CM

## 2020-08-30 NOTE — Patient Instructions (Signed)
Strengthening: Isometric Flexion  Using wall for resistance, press right fist into ball using light pressure. Hold 5 seconds. Repeat 10 times per set. Do 2 sets per session.   SHOULDER: Abduction (Isometric)  Use wall as resistance. Press arm against pillow. Keep elbow straight. Hold 5 seconds. 10 reps per set, 2 sets per day, 4 days per week  Extension (Isometric)  Place left bent elbow and back of arm against wall. Press elbow against wall. Hold 5 seconds. Repeat 10 times. Do 2 sessions per day.  External Rotation (Isometric)  Place back of left fist against door frame, with elbow bent. Press fist against door frame. Hold 5 seconds. Repeat 10 times. Do 2 sessions per day.  Copyright  VHI. All rights reserved.

## 2020-08-30 NOTE — Therapy (Signed)
[place], [place], [address] Phone: [phone]   Fax:  [phone]  Physical Therapy Treatment  Patient Details  Name: [name] [name] MRN: [id number] Date of Birth: Apr 14, 1951 Referring Provider (PT): Debby Bud MD   Encounter Date: 08/30/2020   PT End of Session - 08/30/20 1210    Visit Number 7    Number of Visits 12    Date for PT Re-Evaluation 09/15/20    Authorization Type VA (15 visits max/aquatic therapy has also been approved)    Authorization - Visit Number 7    Authorization - Number of Visits 15    Progress Note Due on Visit 10    PT Start Time 1137    PT Stop Time 1215    PT Time Calculation (min) 38 min    Activity Tolerance Patient tolerated treatment well    Behavior During Therapy [facility] for tasks assessed/performed           Past Medical History:  Diagnosis Date  . Allergy   . Anxiety   . Arthritis    back pain  . Avascular necrosis of bone of hip ([place])    right  . CAD (coronary artery disease)    stents x3 2008  . GERD (gastroesophageal reflux disease)   . History of nuclear stress test    a. Myoview 10/17: EF 56%, diaphragmatic attenuation, no ischemia, low risk  . HTN (hypertension)   . Hyperlipidemia   . Myocardial infarction ([place])    2008- stents x 3   . Paresthesias   . Sleep apnea    no cpap   . Thyroid disease     Past Surgical History:  Procedure Laterality Date  . CARDIAC CATHETERIZATION     3 stents placed  . COLONOSCOPY  2011 NUR [facility] SCREENING d50 v5   MILD [place] TICS, ? PREP ARTIFACT V. PROCTITIS-rX:[place]. next TCS 2021.  . ESOPHAGEAL DILATION N/A 03/02/2016   Procedure: ESOPHAGEAL DILATION;  Surgeon: Danie Binder, MD;  [facility];  Service: Endoscopy;  Laterality: N/A;  . ESOPHAGOGASTRODUODENOSCOPY  10/03/11   small hiatal hernia/gastric ulcers/stricutre in distal esophagus  . ESOPHAGOGASTRODUODENOSCOPY N/A 03/02/2016   Dr. Oneida Alar: Schatzki's  ring at [place] junction s/p dilation, mild non-erosive gastritis/duodenitis   . ESOPHAGOGASTRODUODENOSCOPY (EGD) WITH PROPOFOL N/A 02/09/2020   Dr. Oneida Alar: Low-grade narrowing Schatzki ring status post dilation, moderate gastritis, no biopsies taken  . MASS EXCISION Left 09/30/2014   Procedure: EXCISION MASS LEFT WRIST ;  Surgeon: Daryll Brod, MD;  Location: [facility];  Service: Orthopedics;  Laterality: Left;  . ROTATOR CUFF REPAIR Left    [facility]   . SAVORY DILATION N/A 02/09/2020   Procedure: SAVORY DILATION;  Surgeon: Danie Binder, MD;  [facility];  Service: Endoscopy;  Laterality: N/A;  . TONSILLECTOMY  age 55  . TOTAL HIP ARTHROPLASTY  age 7   right hip for avascular necrosis of hip and spine  . TRIGGER FINGER RELEASE Left 03/08/2020   Procedure: RELEASE TRIGGER FINGER/A-1 PULLEY LEFT INDEX FINGER;  Surgeon: Daryll Brod, MD;  Location: [place];  Service: Orthopedics;  Laterality: Left;  IV REGIONAL FOREARM BLOCK  . UPPER GASTROINTESTINAL ENDOSCOPY     with dilation - last 02-2020     There were no vitals filed for this visit.   Subjective Assessment - 08/30/20 1158    Subjective Pt arrived with bandage on Lt hand, stated he  caught a nail while working on a doorway.    Pertinent History CAD, HTN, HLD, MI 2008,    Limitations Walking;Lifting    Patient Stated Goals decrease shoulder pain and lift legs up higher with walking    Currently in Pain? No/denies                             [facility] Adult PT Treatment/Exercise - 08/30/20 0001      Knee/Hip Exercises: Standing   Heel Raises 15 reps    Heel Raises Limitations squat then heel raise no HHA, 5" holds    Lateral Step Up Both;15 reps;Hand Hold: 1;Limitations    Lateral Step Up Limitations heel tapping, eccentric control    Functional Squat 15 reps    Functional Squat Limitations squat then heel raise no HHA, 5" holds    Other Standing Knee Exercises GTB row/shoulder  extension 15x5"    Other Standing Knee Exercises shoulder isometrics against wall (flexion, IR, ER, extension), x15 reps each, 3-5 sec hold, no pain                    PT Short Term Goals - 08/24/20 1008      PT SHORT TERM GOAL #1   Title Patient will be independent with HEP in order to improve functional outcomes.    Time 3    Period Weeks    Status On-going    Target Date 08/25/20      PT SHORT TERM GOAL #2   Title Patient will report at least 25% improvement in symptoms for improved quality of life.    Time 3    Period Weeks    Status On-going    Target Date 08/25/20             PT Long Term Goals - 08/24/20 1008      PT LONG TERM GOAL #1   Title Patient will report at least 75% improvement in symptoms for improved quality of life.    Time 6    Period Weeks    Status On-going      PT LONG TERM GOAL #2   Title Patient will be able to complete 5x STS in under 11.4 seconds in order to reduce the risk of falls.    Time 6    Period Weeks    Status On-going      PT LONG TERM GOAL #3   Title Patient will improve bilateral LE and R shoulder strength by at least 1/2 grade in each plane assessed for improved strength with ADL.    Time 6    Period Weeks    Status On-going                 Plan - 08/30/20 1239    Clinical Impression Statement Pt limited by pain Rt shoulder with ER activities, improved with isometrics vs theraband strengthening.  Reviewed current HEP with additional isometrics added, printout given and verbalized understanding.  Progressed stability with additional squat then heel raise without HHA, instability noted without UE A.  EOS no reports of increased pain.    Personal Factors and Comorbidities Comorbidity 3+;Age;Time since onset of injury/illness/exacerbation;Past/Current Experience    Comorbidities CAD, HTN, HLD, MI 2008    Examination-Activity Limitations Carry;Lift;Locomotion Level;Squat;Stairs;Stand;Transfers     Examination-Participation Restrictions [employer];Shop;Community Activity    Clinical Decision Making Low    Rehab Potential Good    PT Frequency  2x / week    PT Duration 6 weeks    PT Treatment/Interventions ADLs/Self Care Home Management;Aquatic Therapy;Biofeedback;Cryotherapy;Electrical Stimulation;Iontophoresis 4mg /ml Dexamethasone;Moist Heat;Traction;Ultrasound;Parrafin;DME Instruction;Gait training;Functional mobility training;Therapeutic activities;Therapeutic exercise;Balance training;Neuromuscular re-education;Patient/family education;Orthotic Fit/Training;Manual techniques;Passive range of motion;Dry needling;Energy conservation;Splinting;Taping;Vasopneumatic Device;Spinal Manipulations;Joint Manipulations    PT Next Visit Plan Continue LE strengthing with emphasis on hips and quads; possibly assess dynamic balance;  Rotator cuff strengthing for R shoulder with isometrics and progress as able    PT Home Exercise Plan 8/5 STS; 08/11/20: SLS by counter or sink; 8/17: standing marching, hip abd and ext by counter; 8/31: UE isometrics           Patient will benefit from skilled therapeutic intervention in order to improve the following deficits and impairments:  Abnormal gait, Decreased endurance, Decreased activity tolerance, Decreased balance, Decreased range of motion, Decreased mobility, Decreased strength, Improper body mechanics, Pain, Impaired UE functional use  Visit Diagnosis: Other abnormalities of gait and mobility  Muscle weakness (generalized)  Right shoulder pain, unspecified chronicity  Other symptoms and signs involving the musculoskeletal system     Problem List Patient Active Problem List   Diagnosis Date Noted  . Abnormality of esophagus 01/27/2020  . Colon cancer screening 01/27/2020  . Esophageal dysphagia 02/08/2016  . Hemorrhoids 02/08/2016  . Chronic radicular cervical pain 07/29/2014  . GERD (gastroesophageal reflux disease) 10/14/2012  .  Constipation 10/14/2012  . Stiffness of joint, not elsewhere classified, other specified site 02/21/2012  . PUD (peptic ulcer disease) 12/21/2011  . Dysphagia 07/25/2011  . Hyperlipidemia 08/12/2009  . ESSENTIAL HYPERTENSION, BENIGN 08/12/2009  . CAD, NATIVE VESSEL 08/12/2009  . CUTANEOUS ERUPTIONS, DRUG-INDUCED 01/09/2008  . LOCALIZED SUPERFICIAL SWELLING MASS OR LUMP 01/09/2008   Ihor Austin, LPTA/CLT; CBIS [phone]  Aldona Lento 08/30/2020, 12:42 PM  [facility] [address] [place], [place], [address] Phone: [phone]   Fax:  [phone]  Name: TAYJON HALLADAY MRN: [id number] Date of Birth: 1951-06-27

## 2020-08-31 ENCOUNTER — Encounter (HOSPITAL_COMMUNITY): Payer: Self-pay

## 2020-08-31 ENCOUNTER — Ambulatory Visit (HOSPITAL_COMMUNITY): Payer: Medicare Other | Attending: Specialist

## 2020-08-31 DIAGNOSIS — R29898 Other symptoms and signs involving the musculoskeletal system: Secondary | ICD-10-CM | POA: Diagnosis not present

## 2020-08-31 DIAGNOSIS — M6281 Muscle weakness (generalized): Secondary | ICD-10-CM | POA: Diagnosis not present

## 2020-08-31 DIAGNOSIS — M25511 Pain in right shoulder: Secondary | ICD-10-CM | POA: Diagnosis not present

## 2020-08-31 DIAGNOSIS — R2689 Other abnormalities of gait and mobility: Secondary | ICD-10-CM | POA: Insufficient documentation

## 2020-08-31 NOTE — Therapy (Signed)
[name], [place], [address] Phone: [phone]   Fax:  [phone]  Physical Therapy Treatment  Patient Details  Name: [name] [name] MRN: [id number] Date of Birth: 08/08/1951 Referring Provider (PT): Debby Bud MD   Encounter Date: 08/31/2020   PT End of Session - 08/31/20 0929    Visit Number 8    Number of Visits 12    Date for PT Re-Evaluation 09/15/20    Authorization - Visit Number 8    Authorization - Number of Visits 15    Progress Note Due on Visit 10    PT Start Time 0918    PT Stop Time 1000    PT Time Calculation (min) 42 min    Activity Tolerance Patient tolerated treatment well    Behavior During Therapy [facility] for tasks assessed/performed           Past Medical History:  Diagnosis Date  . Allergy   . Anxiety   . Arthritis    back pain  . Avascular necrosis of bone of hip ([place])    right  . CAD (coronary artery disease)    stents x3 2008  . GERD (gastroesophageal reflux disease)   . History of nuclear stress test    a. Myoview 10/17: EF 56%, diaphragmatic attenuation, no ischemia, low risk  . HTN (hypertension)   . Hyperlipidemia   . Myocardial infarction ([place])    2008- stents x 3   . Paresthesias   . Sleep apnea    no cpap   . Thyroid disease     Past Surgical History:  Procedure Laterality Date  . CARDIAC CATHETERIZATION     3 stents placed  . COLONOSCOPY  2011 NUR [facility] SCREENING d50 v5   MILD  TICS, ? PREP ARTIFACT V. PROCTITIS-rX:[place]. next TCS 2021.  . ESOPHAGEAL DILATION N/A 03/02/2016   Procedure: ESOPHAGEAL DILATION;  Surgeon: Danie Binder, MD;  [facility];  Service: Endoscopy;  Laterality: N/A;  . ESOPHAGOGASTRODUODENOSCOPY  10/03/11   small hiatal hernia/gastric ulcers/stricutre in distal esophagus  . ESOPHAGOGASTRODUODENOSCOPY N/A 03/02/2016   Dr. Oneida Alar: Schatzki's ring at [place] junction s/p dilation, mild non-erosive gastritis/duodenitis   .  ESOPHAGOGASTRODUODENOSCOPY (EGD) WITH PROPOFOL N/A 02/09/2020   Dr. Oneida Alar: Low-grade narrowing Schatzki ring status post dilation, moderate gastritis, no biopsies taken  . MASS EXCISION Left 09/30/2014   Procedure: EXCISION MASS LEFT WRIST ;  Surgeon: Daryll Brod, MD;  Location: [facility];  Service: Orthopedics;  Laterality: Left;  . ROTATOR CUFF REPAIR Left    [facility]   . SAVORY DILATION N/A 02/09/2020   Procedure: SAVORY DILATION;  Surgeon: Danie Binder, MD;  [facility];  Service: Endoscopy;  Laterality: N/A;  . TONSILLECTOMY  age 3  . TOTAL HIP ARTHROPLASTY  age 77   right hip for avascular necrosis of hip and spine  . TRIGGER FINGER RELEASE Left 03/08/2020   Procedure: RELEASE TRIGGER FINGER/A-1 PULLEY LEFT INDEX FINGER;  Surgeon: Daryll Brod, MD;  Location: [facility];  Service: Orthopedics;  Laterality: Left;  IV REGIONAL FOREARM BLOCK  . UPPER GASTROINTESTINAL ENDOSCOPY     with dilation - last 02-2020     There were no vitals filed for this visit.   Subjective Assessment - 08/31/20 0921    Subjective Pt stated he had a rough night sleep, no reoprts of pain today.    Pertinent History CAD, HTN, HLD, MI 2008,  Patient Stated Goals decrease shoulder pain and lift legs up higher with walking    Currently in Pain? No/denies                             [facility] Adult PT Treatment/Exercise - 08/31/20 0001      Knee/Hip Exercises: Standing   Heel Raises 15 reps    Heel Raises Limitations squat then heel raise no HHA, 5" holds    Forward Step Up Both;15 reps    Forward Step Up Limitations knee drive    Functional Squat 15 reps    Functional Squat Limitations squat then heel raise no HHA, 5" holds    SLS with Vectors 3x 5" on foam wiht intermittent HHA    Other Standing Knee Exercises GTB row/shoulder extension 15x5"; ER 15x; Isometric ER walk 5x each UE    Other Standing Knee Exercises crossover step up 7in 10x each;  6/12in hurdles forward and lateral 2RT      Knee/Hip Exercises: Seated   Sit to Sand 2 sets;5 reps;without UE support   5STS 13.19"                   PT Short Term Goals - 08/24/20 1008      PT SHORT TERM GOAL #1   Title Patient will be independent with HEP in order to improve functional outcomes.    Time 3    Period Weeks    Status On-going    Target Date 08/25/20      PT SHORT TERM GOAL #2   Title Patient will report at least 25% improvement in symptoms for improved quality of life.    Time 3    Period Weeks    Status On-going    Target Date 08/25/20             PT Long Term Goals - 08/24/20 1008      PT LONG TERM GOAL #1   Title Patient will report at least 75% improvement in symptoms for improved quality of life.    Time 6    Period Weeks    Status On-going      PT LONG TERM GOAL #2   Title Patient will be able to complete 5x STS in under 11.4 seconds in order to reduce the risk of falls.    Time 6    Period Weeks    Status On-going      PT LONG TERM GOAL #3   Title Patient will improve bilateral LE and R shoulder strength by at least 1/2 grade in each plane assessed for improved strength with ADL.    Time 6    Period Weeks    Status On-going                 Plan - 08/31/20 0953    Clinical Impression Statement Progressed hip strengthening/stability with additional crossover step ups and hurdles for balance.  Min A required for LOB with hurdles.   Improved tolerance this session with ER strengthening exercises wiht Rt shoulder.  No reports of pain through session.    Personal Factors and Comorbidities Comorbidity 3+;Age;Time since onset of injury/illness/exacerbation;Past/Current Experience    Comorbidities CAD, HTN, HLD, MI 2008    Examination-Activity Limitations Carry;Lift;Locomotion Level;Squat;Stairs;Stand;Transfers    Examination-Participation Restrictions [employer];Shop;Community Activity    Stability/Clinical Decision  Making Stable/Uncomplicated    Clinical Decision Making Low    Rehab Potential Good  PT Frequency 2x / week    PT Duration 6 weeks    PT Treatment/Interventions ADLs/Self Care Home Management;Aquatic Therapy;Biofeedback;Cryotherapy;Electrical Stimulation;Iontophoresis 4mg /ml Dexamethasone;Moist Heat;Traction;Ultrasound;Parrafin;DME Instruction;Gait training;Functional mobility training;Therapeutic activities;Therapeutic exercise;Balance training;Neuromuscular re-education;Patient/family education;Orthotic Fit/Training;Manual techniques;Passive range of motion;Dry needling;Energy conservation;Splinting;Taping;Vasopneumatic Device;Spinal Manipulations;Joint Manipulations    PT Next Visit Plan Begin lunges/lunge walking.  Continue LE strengthing with emphasis on hips and quads; possibly assess dynamic balance;  Rotator cuff strengthing for R shoulder with isometrics and progress as able    PT Home Exercise Plan 8/5 STS; 08/11/20: SLS by counter or sink; 8/17: standing marching, hip abd and ext by counter; 8/31: UE isometrics           Patient will benefit from skilled therapeutic intervention in order to improve the following deficits and impairments:  Abnormal gait, Decreased endurance, Decreased activity tolerance, Decreased balance, Decreased range of motion, Decreased mobility, Decreased strength, Improper body mechanics, Pain, Impaired UE functional use  Visit Diagnosis: Other abnormalities of gait and mobility  Muscle weakness (generalized)  Right shoulder pain, unspecified chronicity  Other symptoms and signs involving the musculoskeletal system     Problem List Patient Active Problem List   Diagnosis Date Noted  . Abnormality of esophagus 01/27/2020  . Colon cancer screening 01/27/2020  . Esophageal dysphagia 02/08/2016  . Hemorrhoids 02/08/2016  . Chronic radicular cervical pain 07/29/2014  . GERD (gastroesophageal reflux disease) 10/14/2012  . Constipation 10/14/2012  .  Stiffness of joint, not elsewhere classified, other specified site 02/21/2012  . PUD (peptic ulcer disease) 12/21/2011  . Dysphagia 07/25/2011  . Hyperlipidemia 08/12/2009  . ESSENTIAL HYPERTENSION, BENIGN 08/12/2009  . CAD, NATIVE VESSEL 08/12/2009  . CUTANEOUS ERUPTIONS, DRUG-INDUCED 01/09/2008  . LOCALIZED SUPERFICIAL SWELLING MASS OR LUMP 01/09/2008   Ihor Austin, LPTA/CLT; CBIS [phone]  Aldona Lento 08/31/2020, 1:04 PM  [facility] [address] [place], [place], [address] Phone: [phone]   Fax:  [phone]  Name: [name] [name] MRN: [id number] Date of Birth: February 19, 1951

## 2020-09-01 ENCOUNTER — Encounter (HOSPITAL_COMMUNITY): Payer: Medicare Other

## 2020-09-01 DIAGNOSIS — R7309 Other abnormal glucose: Secondary | ICD-10-CM | POA: Diagnosis not present

## 2020-09-06 ENCOUNTER — Encounter (HOSPITAL_COMMUNITY): Payer: Medicare Other | Admitting: Physical Therapy

## 2020-09-07 ENCOUNTER — Encounter (HOSPITAL_COMMUNITY): Payer: Self-pay | Admitting: Physical Therapy

## 2020-09-07 ENCOUNTER — Ambulatory Visit (HOSPITAL_COMMUNITY): Payer: Medicare Other | Admitting: Physical Therapy

## 2020-09-07 ENCOUNTER — Other Ambulatory Visit: Payer: Self-pay

## 2020-09-07 DIAGNOSIS — M6281 Muscle weakness (generalized): Secondary | ICD-10-CM

## 2020-09-07 DIAGNOSIS — R29898 Other symptoms and signs involving the musculoskeletal system: Secondary | ICD-10-CM | POA: Diagnosis not present

## 2020-09-07 DIAGNOSIS — M25511 Pain in right shoulder: Secondary | ICD-10-CM

## 2020-09-07 DIAGNOSIS — R2689 Other abnormalities of gait and mobility: Secondary | ICD-10-CM | POA: Diagnosis not present

## 2020-09-07 NOTE — Therapy (Signed)
[place], [place], [address] Phone: [phone]   Fax:  [phone]  Physical Therapy Treatment  Patient Details  Name: [name] [name] MRN: [id number] Date of Birth: June 11, 1951 Referring Provider (PT): Debby Bud MD   Encounter Date: 09/07/2020   PT End of Session - 09/07/20 1032    Visit Number 9    Number of Visits 12    Date for PT Re-Evaluation 09/15/20    Authorization - Visit Number 9    Authorization - Number of Visits 15    Progress Note Due on Visit 10    PT Start Time 1033    PT Stop Time 1113    PT Time Calculation (min) 40 min    Activity Tolerance Patient tolerated treatment well    Behavior During Therapy [facility] for tasks assessed/performed           Past Medical History:  Diagnosis Date  . Allergy   . Anxiety   . Arthritis    back pain  . Avascular necrosis of bone of hip ([place])    right  . CAD (coronary artery disease)    stents x3 2008  . GERD (gastroesophageal reflux disease)   . History of nuclear stress test    a. Myoview 10/17: EF 56%, diaphragmatic attenuation, no ischemia, low risk  . HTN (hypertension)   . Hyperlipidemia   . Myocardial infarction ([place])    2008- stents x 3   . Paresthesias   . Sleep apnea    no cpap   . Thyroid disease     Past Surgical History:  Procedure Laterality Date  . CARDIAC CATHETERIZATION     3 stents placed  . COLONOSCOPY  2011 NUR [facility] SCREENING d50 v5   MILD [place] TICS, ? PREP ARTIFACT V. PROCTITIS-rX:[place]. next TCS 2021.  . ESOPHAGEAL DILATION N/A 03/02/2016   Procedure: ESOPHAGEAL DILATION;  Surgeon: Danie Binder, MD;  [facility];  Service: Endoscopy;  Laterality: N/A;  . ESOPHAGOGASTRODUODENOSCOPY  10/03/11   small hiatal hernia/gastric ulcers/stricutre in distal esophagus  . ESOPHAGOGASTRODUODENOSCOPY N/A 03/02/2016   Dr. Oneida Alar: Schatzki's ring at [place] junction s/p dilation, mild non-erosive gastritis/duodenitis   .  ESOPHAGOGASTRODUODENOSCOPY (EGD) WITH PROPOFOL N/A 02/09/2020   Dr. Oneida Alar: Low-grade narrowing Schatzki ring status post dilation, moderate gastritis, no biopsies taken  . MASS EXCISION Left 09/30/2014   Procedure: EXCISION MASS LEFT WRIST ;  Surgeon: Daryll Brod, MD;  Location: [facility];  Service: Orthopedics;  Laterality: Left;  . ROTATOR CUFF REPAIR Left    [facility]   . SAVORY DILATION N/A 02/09/2020   Procedure: SAVORY DILATION;  Surgeon: Danie Binder, MD;  [facility];  Service: Endoscopy;  Laterality: N/A;  . TONSILLECTOMY  age 78  . TOTAL HIP ARTHROPLASTY  age 53   right hip for avascular necrosis of hip and spine  . TRIGGER FINGER RELEASE Left 03/08/2020   Procedure: RELEASE TRIGGER FINGER/A-1 PULLEY LEFT INDEX FINGER;  Surgeon: Daryll Brod, MD;  Location: [facility];  Service: Orthopedics;  Laterality: Left;  IV REGIONAL FOREARM BLOCK  . UPPER GASTROINTESTINAL ENDOSCOPY     with dilation - last 02-2020     There were no vitals filed for this visit.   Subjective Assessment - 09/07/20 1032    Subjective Patient states he has not had any falls. He states he has been having less pain. He has not been using his shoulder  much.    Pertinent History CAD, HTN, HLD, MI 2008,    Patient Stated Goals decrease shoulder pain and lift legs up higher with walking    Currently in Pain? No/denies                             [facility] Adult PT Treatment/Exercise - 09/07/20 0001      Knee/Hip Exercises: Standing   Forward Lunges 2 sets;10 reps;Both    Forward Lunges Limitations unilateral UE support    Forward Step Up Both;15 reps    Forward Step Up Limitations knee drive    Functional Squat 3 sets;10 reps    SLS with Vectors 5x5 second holds bilateral on foam    Other Standing Knee Exercises ball stabilization at wall with little green ball ABCs 2x    Other Standing Knee Exercises Blue band row and extension 2x15 each; ER isometric  lateral walk with blue band 2x5; resisted ER with blue band 2x10 in neutral                  PT Education - 09/07/20 1032    Education Details Exercise technique, continue HEP    Person(s) Educated Patient    Methods Explanation;Demonstration    Comprehension Verbalized understanding;Returned demonstration            PT Short Term Goals - 08/24/20 1008      PT SHORT TERM GOAL #1   Title Patient will be independent with HEP in order to improve functional outcomes.    Time 3    Period Weeks    Status On-going    Target Date 08/25/20      PT SHORT TERM GOAL #2   Title Patient will report at least 25% improvement in symptoms for improved quality of life.    Time 3    Period Weeks    Status On-going    Target Date 08/25/20             PT Long Term Goals - 08/24/20 1008      PT LONG TERM GOAL #1   Title Patient will report at least 75% improvement in symptoms for improved quality of life.    Time 6    Period Weeks    Status On-going      PT LONG TERM GOAL #2   Title Patient will be able to complete 5x STS in under 11.4 seconds in order to reduce the risk of falls.    Time 6    Period Weeks    Status On-going      PT LONG TERM GOAL #3   Title Patient will improve bilateral LE and R shoulder strength by at least 1/2 grade in each plane assessed for improved strength with ADL.    Time 6    Period Weeks    Status On-going                 Plan - 09/07/20 1033    Clinical Impression Statement Patient shows impaired dynamic balance with step up with knee drive requiring intermittent UE support for balance. He has greater difficulty completing on RLE. Patient demonstrates good squatting mechanics today with minimal verbal cueing. Added ball stabilization on wall for improved rotator cuff activation and endurance which patient tolerates well with c/o fatigue following. Patient requires unilateral UE support with SLS with vectors secondary to impaired balance.  Patient requires frequent verbal cueing initially with  lunge mechanics for lowering rather than using anterior translation to complete. Patient tolerates increased resistance and reps with band shoulder exercises today. Patient will continue to benefit from skilled physical therapy in order to reduce impairment and improve function.    Personal Factors and Comorbidities Comorbidity 3+;Age;Time since onset of injury/illness/exacerbation;Past/Current Experience    Comorbidities CAD, HTN, HLD, MI 2008    Examination-Activity Limitations Carry;Lift;Locomotion Level;Squat;Stairs;Stand;Transfers    Examination-Participation Restrictions [employer];Shop;Community Activity    Stability/Clinical Decision Making Stable/Uncomplicated    Rehab Potential Good    PT Frequency 2x / week    PT Duration 6 weeks    PT Treatment/Interventions ADLs/Self Care Home Management;Aquatic Therapy;Biofeedback;Cryotherapy;Electrical Stimulation;Iontophoresis 4mg /ml Dexamethasone;Moist Heat;Traction;Ultrasound;Parrafin;DME Instruction;Gait training;Functional mobility training;Therapeutic activities;Therapeutic exercise;Balance training;Neuromuscular re-education;Patient/family education;Orthotic Fit/Training;Manual techniques;Passive range of motion;Dry needling;Energy conservation;Splinting;Taping;Vasopneumatic Device;Spinal Manipulations;Joint Manipulations    PT Next Visit Plan Begin lunges/lunge walking.  Continue LE strengthing with emphasis on hips and quads; possibly assess dynamic balance;  Rotator cuff strengthing for R shoulder with isometrics and progress as able    PT Home Exercise Plan 8/5 STS; 08/11/20: SLS by counter or sink; 8/17: standing marching, hip abd and ext by counter; 8/31: UE isometrics           Patient will benefit from skilled therapeutic intervention in order to improve the following deficits and impairments:  Abnormal gait, Decreased endurance, Decreased activity tolerance, Decreased  balance, Decreased range of motion, Decreased mobility, Decreased strength, Improper body mechanics, Pain, Impaired UE functional use  Visit Diagnosis: Other abnormalities of gait and mobility  Muscle weakness (generalized)  Right shoulder pain, unspecified chronicity  Other symptoms and signs involving the musculoskeletal system     Problem List Patient Active Problem List   Diagnosis Date Noted  . Abnormality of esophagus 01/27/2020  . Colon cancer screening 01/27/2020  . Esophageal dysphagia 02/08/2016  . Hemorrhoids 02/08/2016  . Chronic radicular cervical pain 07/29/2014  . GERD (gastroesophageal reflux disease) 10/14/2012  . Constipation 10/14/2012  . Stiffness of joint, not elsewhere classified, other specified site 02/21/2012  . PUD (peptic ulcer disease) 12/21/2011  . Dysphagia 07/25/2011  . Hyperlipidemia 08/12/2009  . ESSENTIAL HYPERTENSION, BENIGN 08/12/2009  . CAD, NATIVE VESSEL 08/12/2009  . CUTANEOUS ERUPTIONS, DRUG-INDUCED 01/09/2008  . LOCALIZED SUPERFICIAL SWELLING MASS OR LUMP 01/09/2008    11:10 AM, 09/07/20 Mearl Latin PT, DPT Physical Therapist at [place], [place], [address] Phone: [phone]   Fax:  [phone]  Name: [name] [name] MRN: [id number] Date of Birth: 1951/06/01

## 2020-09-08 ENCOUNTER — Encounter (HOSPITAL_COMMUNITY): Payer: Self-pay | Admitting: Physical Therapy

## 2020-09-08 ENCOUNTER — Ambulatory Visit (HOSPITAL_COMMUNITY): Payer: Medicare Other | Admitting: Physical Therapy

## 2020-09-08 DIAGNOSIS — M25511 Pain in right shoulder: Secondary | ICD-10-CM

## 2020-09-08 DIAGNOSIS — R29898 Other symptoms and signs involving the musculoskeletal system: Secondary | ICD-10-CM | POA: Diagnosis not present

## 2020-09-08 DIAGNOSIS — M6281 Muscle weakness (generalized): Secondary | ICD-10-CM

## 2020-09-08 DIAGNOSIS — R2689 Other abnormalities of gait and mobility: Secondary | ICD-10-CM

## 2020-09-08 NOTE — Therapy (Signed)
[facility] [address] [place], [place], [address] Phone: [phone]   Fax:  [phone]  Physical Therapy Treatment  Patient Details  Name: [name] [name] MRN: [id number] Date of Birth: [birth year] Referring Provider (PT): Darlina Rumpf MD   Encounter Date: 09/08/2020   PT End of Session - 09/08/20 1119    Visit Number 10    Number of Visits 12    Date for PT Re-Evaluation 09/15/20    Authorization Type VA (15 visits max/aquatic therapy has also been approved)    Authorization - Visit Number 10    Authorization - Number of Visits 15    Progress Note Due on Visit 20    PT Start Time 1120    PT Stop Time 1200    PT Time Calculation (min) 40 min    Activity Tolerance Patient tolerated treatment well    Behavior During Therapy [facility] for tasks assessed/performed           Past Medical History:  Diagnosis Date  . Allergy   . Anxiety   . Arthritis    back pain  . Avascular necrosis of bone of hip (HCC)    right  . CAD (coronary artery disease)    stents x3 2008  . GERD (gastroesophageal reflux disease)   . History of nuclear stress test    a. Myoview 10/17: EF 56%, diaphragmatic attenuation, no ischemia, low risk  . HTN (hypertension)   . Hyperlipidemia   . Myocardial infarction (HCC)    2008- stents x 3   . Paresthesias   . Sleep apnea    no cpap   . Thyroid disease     Past Surgical History:  Procedure Laterality Date  . CARDIAC CATHETERIZATION     3 stents placed  . COLONOSCOPY  2011 NUR [facility] SCREENING d50 v5   MILD [place] TICS, ? PREP ARTIFACT V. PROCTITIS-rX:[place]. next TCS 2021.  . ESOPHAGEAL DILATION N/A 03/02/2016   Procedure: ESOPHAGEAL DILATION;  Surgeon: West Bali, MD;  [facility];  Service: Endoscopy;  Laterality: N/A;  . ESOPHAGOGASTRODUODENOSCOPY  10/03/11   small hiatal hernia/gastric ulcers/stricutre in distal esophagus  . ESOPHAGOGASTRODUODENOSCOPY N/A 03/02/2016   Dr. Darrick Penna: Schatzki's  ring at GE junction s/p dilation, mild non-erosive gastritis/duodenitis   . ESOPHAGOGASTRODUODENOSCOPY (EGD) WITH PROPOFOL N/A 02/09/2020   Dr. Darrick Penna: Low-grade narrowing Schatzki ring status post dilation, moderate gastritis, no biopsies taken  . MASS EXCISION Left 09/30/2014   Procedure: EXCISION MASS LEFT WRIST ;  Surgeon: Cindee Salt, MD;  Location: [facility];  Service: Orthopedics;  Laterality: Left;  . ROTATOR CUFF REPAIR Left    [facility]   . SAVORY DILATION N/A 02/09/2020   Procedure: SAVORY DILATION;  Surgeon: West Bali, MD;  [facility];  Service: Endoscopy;  Laterality: N/A;  . TONSILLECTOMY  age 59  . TOTAL HIP ARTHROPLASTY  age 65   right hip for avascular necrosis of hip and spine  . TRIGGER FINGER RELEASE Left 03/08/2020   Procedure: RELEASE TRIGGER FINGER/A-1 PULLEY LEFT INDEX FINGER;  Surgeon: Cindee Salt, MD;  Location: [facility];  Service: Orthopedics;  Laterality: Left;  IV REGIONAL FOREARM BLOCK  . UPPER GASTROINTESTINAL ENDOSCOPY     with dilation - last 02-2020     There were no vitals filed for this visit.   Subjective Assessment - 09/08/20 1118    Subjective Patient states when he does his home exercises they  are going well. Patient states 30% improvement with physical therapy intervention.    Pertinent History CAD, HTN, HLD, MI 2008,    Patient Stated Goals decrease shoulder pain and lift legs up higher with walking    Currently in Pain? No/denies              [facility] PT Assessment - 09/08/20 0001      Assessment   Medical Diagnosis Proximal Muscle Weakness    Referring Provider (PT) Debby Bud MD    Onset Date/Surgical Date 08/05/19    Next MD Visit Jan 2022    Prior Therapy Yes L shoulder, R Hip, low back      Precautions   Precautions None      Restrictions   Weight Bearing Restrictions No      Balance Screen   Has the patient fallen in the past 6 months No    Has the patient had a decrease in  activity level because of a fear of falling?  No    Is the patient reluctant to leave their home because of a fear of falling?  No      Prior Function   Level of Independence Independent    Vocation Retired      Charity fundraiser Status Within Functional Limits for tasks assessed      Observation/Other Assessments   Observations Ambulates without AD    Focus on Therapeutic Outcomes (FOTO)  No FOTO      Strength   Right Shoulder Flexion 4/5    Right Shoulder ABduction 3+/5    Right Shoulder Internal Rotation 4+/5    Right Shoulder External Rotation 4/5    Right Hip Flexion 4-/5    Left Hip Flexion 4-/5    Right Knee Flexion 4-/5    Right Knee Extension 4+/5    Left Knee Flexion 4+/5    Left Knee Extension 4/5    Right Ankle Dorsiflexion 4+/5    Left Ankle Dorsiflexion 4+/5      Transfers   Five time sit to stand comments  10.56 seconds    Comments no UE use                         [facility] Adult PT Treatment/Exercise - 09/08/20 0001      Knee/Hip Exercises: Standing   Other Standing Knee Exercises ball stabilization at wall with little green ball ABCs 2x; Blue band row and extension 2x15 each; ER isometric lateral walk with blue band 2x5; resisted ER with blue band 2x10 in neutral    Other Standing Knee Exercises crossover step up 7in 10x each;                  PT Education - 09/08/20 1118    Education Details Exercise technique, continue HEP, Progress made    Person(s) Educated Patient    Methods Explanation;Demonstration;Handout    Comprehension Verbalized understanding;Returned demonstration            PT Short Term Goals - 09/08/20 1130      PT SHORT TERM GOAL #1   Title Patient will be independent with HEP in order to improve functional outcomes.    Time 3    Period Weeks    Status On-going    Target Date 08/25/20      PT SHORT TERM GOAL #2   Title Patient will report at least 25% improvement in symptoms for improved  quality of  life.    Time 3    Period Weeks    Status Achieved    Target Date 08/25/20             PT Long Term Goals - 09/08/20 1131      PT LONG TERM GOAL #1   Title Patient will report at least 75% improvement in symptoms for improved quality of life.    Time 6    Period Weeks    Status On-going      PT LONG TERM GOAL #2   Title Patient will be able to complete 5x STS in under 11.4 seconds in order to reduce the risk of falls.    Time 6    Period Weeks    Status Achieved      PT LONG TERM GOAL #3   Title Patient will improve bilateral LE and R shoulder strength by at least 1/2 grade in each plane assessed for improved strength with ADL.    Time 6    Period Weeks    Status On-going                 Plan - 09/08/20 1119    Clinical Impression Statement Patient has met 1/2 short term goals with improved function and remaining goal not met due to inconsistency with HEP. Patient has met 1/3 long term goals with improved functional strength indicated by improved time on 5x STS showing patient is at a decreased risk for falling. Remaining goals not met currently due to continued decrease in strength and R shoulder pain/weakness. Patient showing improvement in both LE and UE strength since beginning therapy but continues to be limited by weakness/pain. Patient given band for home use for postural strengthening and rotator cuff strengthening exercises today. Plan for patient to be discharged at end of current POC. Patient will continue to benefit from skilled physical therapy in order to reduce impairment and improve function.    Personal Factors and Comorbidities Comorbidity 3+;Age;Time since onset of injury/illness/exacerbation;Past/Current Experience    Comorbidities CAD, HTN, HLD, MI 2008    Examination-Activity Limitations Carry;Lift;Locomotion Level;Squat;Stairs;Stand;Transfers    Examination-Participation Restrictions [employer];Shop;Community Activity     Stability/Clinical Decision Making Stable/Uncomplicated    Rehab Potential Good    PT Frequency 2x / week    PT Duration 6 weeks    PT Treatment/Interventions ADLs/Self Care Home Management;Aquatic Therapy;Biofeedback;Cryotherapy;Electrical Stimulation;Iontophoresis 38m/ml Dexamethasone;Moist Heat;Traction;Ultrasound;Parrafin;DME Instruction;Gait training;Functional mobility training;Therapeutic activities;Therapeutic exercise;Balance training;Neuromuscular re-education;Patient/family education;Orthotic Fit/Training;Manual techniques;Passive range of motion;Dry needling;Energy conservation;Splinting;Taping;Vasopneumatic Device;Spinal Manipulations;Joint Manipulations    PT Next Visit Plan Begin lunges/lunge walking.  Continue LE strengthing with emphasis on hips and quads; possibly assess dynamic balance;  Rotator cuff strengthing for R shoulder with isometrics and progress as able    PT Home Exercise Plan 8/5 STS; 08/11/20: SLS by counter or sink; 8/17: standing marching, hip abd and ext by counter; 8/31: UE isometrics 9/9 row, ext, ER walkout, band ER           Patient will benefit from skilled therapeutic intervention in order to improve the following deficits and impairments:  Abnormal gait, Decreased endurance, Decreased activity tolerance, Decreased balance, Decreased range of motion, Decreased mobility, Decreased strength, Improper body mechanics, Pain, Impaired UE functional use  Visit Diagnosis: Other abnormalities of gait and mobility  Muscle weakness (generalized)  Right shoulder pain, unspecified chronicity  Other symptoms and signs involving the musculoskeletal system     Problem List Patient Active Problem List   Diagnosis Date Noted  .  Abnormality of esophagus 01/27/2020  . Colon cancer screening 01/27/2020  . Esophageal dysphagia 02/08/2016  . Hemorrhoids 02/08/2016  . Chronic radicular cervical pain 07/29/2014  . GERD (gastroesophageal reflux disease) 10/14/2012  .  Constipation 10/14/2012  . Stiffness of joint, not elsewhere classified, other specified site 02/21/2012  . PUD (peptic ulcer disease) 12/21/2011  . Dysphagia 07/25/2011  . Hyperlipidemia 08/12/2009  . ESSENTIAL HYPERTENSION, BENIGN 08/12/2009  . CAD, NATIVE VESSEL 08/12/2009  . CUTANEOUS ERUPTIONS, DRUG-INDUCED 01/09/2008  . LOCALIZED SUPERFICIAL SWELLING MASS OR LUMP 01/09/2008    12:03 PM, 09/08/20 Mearl Latin PT, DPT Physical Therapist at [place], [place], [address] Phone: [phone]   Fax:  [phone]  Name: [name] [name] MRN: [id number] Date of Birth: 1951/01/31

## 2020-09-08 NOTE — Patient Instructions (Signed)
Access Code: [email or web address] URL: [email or web address] Date: 09/08/2020 Prepared by: [facility] Levia Waltermire  Exercises Standing Shoulder Row with Anchored Resistance - 1 x daily - 7 x weekly - 2 sets - 15 reps Shoulder extension with resistance - Neutral - 1 x daily - 7 x weekly - 2 sets - 15 reps Shoulder External Rotation Reactive Isometrics - 1 x daily - 7 x weekly - 10 reps Shoulder External Rotation with Anchored Resistance - 1 x daily - 7 x weekly - 2 sets - 10 reps

## 2020-09-13 ENCOUNTER — Other Ambulatory Visit: Payer: Self-pay

## 2020-09-13 ENCOUNTER — Ambulatory Visit (HOSPITAL_COMMUNITY): Payer: Medicare Other | Admitting: Physical Therapy

## 2020-09-13 ENCOUNTER — Encounter (HOSPITAL_COMMUNITY): Payer: Self-pay | Admitting: Physical Therapy

## 2020-09-13 DIAGNOSIS — R2689 Other abnormalities of gait and mobility: Secondary | ICD-10-CM | POA: Diagnosis not present

## 2020-09-13 DIAGNOSIS — M25511 Pain in right shoulder: Secondary | ICD-10-CM | POA: Diagnosis not present

## 2020-09-13 DIAGNOSIS — M6281 Muscle weakness (generalized): Secondary | ICD-10-CM

## 2020-09-13 DIAGNOSIS — R29898 Other symptoms and signs involving the musculoskeletal system: Secondary | ICD-10-CM | POA: Diagnosis not present

## 2020-09-13 NOTE — Therapy (Signed)
[place], [place], [address] Phone: [phone]   Fax:  [phone]  Physical Therapy Treatment  Patient Details  Name: [name] [name] MRN: [id number] Date of Birth: [birth year] Referring Provider (PT): Debby Bud MD   Encounter Date: 09/13/2020   PT End of Session - 09/13/20 1118    Visit Number 11    Number of Visits 12    Date for PT Re-Evaluation 09/15/20    Authorization Type VA (15 visits max/aquatic therapy has also been approved)    Authorization - Visit Number 11    Authorization - Number of Visits 15    Progress Note Due on Visit 20    PT Start Time 1118    PT Stop Time 1158    PT Time Calculation (min) 40 min    Activity Tolerance Patient tolerated treatment well    Behavior During Therapy [facility] for tasks assessed/performed           Past Medical History:  Diagnosis Date  . Allergy   . Anxiety   . Arthritis    back pain  . Avascular necrosis of bone of hip ([place])    right  . CAD (coronary artery disease)    stents x3 2008  . GERD (gastroesophageal reflux disease)   . History of nuclear stress test    a. Myoview 10/17: EF 56%, diaphragmatic attenuation, no ischemia, low risk  . HTN (hypertension)   . Hyperlipidemia   . Myocardial infarction ([place])    2008- stents x 3   . Paresthesias   . Sleep apnea    no cpap   . Thyroid disease     Past Surgical History:  Procedure Laterality Date  . CARDIAC CATHETERIZATION     3 stents placed  . COLONOSCOPY  2011 NUR [facility] SCREENING d50 v5   MILD [place] TICS, ? PREP ARTIFACT V. PROCTITIS-rX:[place]. next TCS 2021.  . ESOPHAGEAL DILATION N/A 03/02/2016   Procedure: ESOPHAGEAL DILATION;  Surgeon: Danie Binder, MD;  [facility];  Service: Endoscopy;  Laterality: N/A;  . ESOPHAGOGASTRODUODENOSCOPY  10/03/11   small hiatal hernia/gastric ulcers/stricutre in distal esophagus  . ESOPHAGOGASTRODUODENOSCOPY N/A 03/02/2016   Dr. Oneida Alar: Schatzki's  ring at [place] junction s/p dilation, mild non-erosive gastritis/duodenitis   . ESOPHAGOGASTRODUODENOSCOPY (EGD) WITH PROPOFOL N/A 02/09/2020   Dr. Oneida Alar: Low-grade narrowing Schatzki ring status post dilation, moderate gastritis, no biopsies taken  . MASS EXCISION Left 09/30/2014   Procedure: EXCISION MASS LEFT WRIST ;  Surgeon: Daryll Brod, MD;  Location: [facility];  Service: Orthopedics;  Laterality: Left;  . ROTATOR CUFF REPAIR Left    [facility]   . SAVORY DILATION N/A 02/09/2020   Procedure: SAVORY DILATION;  Surgeon: Danie Binder, MD;  [facility];  Service: Endoscopy;  Laterality: N/A;  . TONSILLECTOMY  age 61  . TOTAL HIP ARTHROPLASTY  age 18   right hip for avascular necrosis of hip and spine  . TRIGGER FINGER RELEASE Left 03/08/2020   Procedure: RELEASE TRIGGER FINGER/A-1 PULLEY LEFT INDEX FINGER;  Surgeon: Daryll Brod, MD;  Location: [place] [facility];  Service: Orthopedics;  Laterality: Left;  IV REGIONAL FOREARM BLOCK  . UPPER GASTROINTESTINAL ENDOSCOPY     with dilation - last 02-2020     There were no vitals filed for this visit.   Subjective Assessment - 09/13/20 1117    Subjective Patient states he has not had any falls. He  states he had a bout of chest tightness and symptoms going into his arms and neck. He also had some flashing in his eyes the other night for about 30 minutes. He has not been able to do his home exercises.    Pertinent History CAD, HTN, HLD, MI 2008,    Patient Stated Goals decrease shoulder pain and lift legs up higher with walking    Currently in Pain? No/denies                             [facility] Adult PT Treatment/Exercise - 09/13/20 0001      Knee/Hip Exercises: Standing   Forward Lunges 2 sets;10 reps;Both    Forward Lunges Limitations no UE support    Forward Step Up Both;15 reps    Forward Step Up Limitations knee drive    Functional Squat 3 sets;10 reps    Other Standing Knee Exercises ball  stabilization at wall with little red ball ABCs 2x; Blue band row and extension 2x15 each; ER isometric lateral walk with blue band 2x5; resisted ER with blue band 2x10 in neutral    Other Standing Knee Exercises crossover step up 7in 10x each;                  PT Education - 09/13/20 1118    Education Details Exercise technique, continue HEP    Person(s) Educated Patient    Methods Explanation;Demonstration    Comprehension Verbalized understanding;Returned demonstration            PT Short Term Goals - 09/08/20 1130      PT SHORT TERM GOAL #1   Title Patient will be independent with HEP in order to improve functional outcomes.    Time 3    Period Weeks    Status On-going    Target Date 08/25/20      PT SHORT TERM GOAL #2   Title Patient will report at least 25% improvement in symptoms for improved quality of life.    Time 3    Period Weeks    Status Achieved    Target Date 08/25/20             PT Long Term Goals - 09/08/20 1131      PT LONG TERM GOAL #1   Title Patient will report at least 75% improvement in symptoms for improved quality of life.    Time 6    Period Weeks    Status On-going      PT LONG TERM GOAL #2   Title Patient will be able to complete 5x STS in under 11.4 seconds in order to reduce the risk of falls.    Time 6    Period Weeks    Status Achieved      PT LONG TERM GOAL #3   Title Patient will improve bilateral LE and R shoulder strength by at least 1/2 grade in each plane assessed for improved strength with ADL.    Time 6    Period Weeks    Status On-going                 Plan - 09/13/20 1118    Clinical Impression Statement Discussed patients' symptoms that he has occasionally and he is educated on following up with MD regarding. He thinks they may be coming from his back and he is educated on getting a referral for his back for PT if interested  in exploring further. Patient requires unilateral UE support with step up  with knee drive secondary to impaired dynamic balance. Patient requires prior demonstration and verbal cueing for lunge mechanics. Anticipate d/c next session. Patient will continue to benefit from skilled physical therapy in order to reduce impairment and improve function.    Personal Factors and Comorbidities Comorbidity 3+;Age;Time since onset of injury/illness/exacerbation;Past/Current Experience    Comorbidities CAD, HTN, HLD, MI 2008    Examination-Activity Limitations Carry;Lift;Locomotion Level;Squat;Stairs;Stand;Transfers    Examination-Participation Restrictions [employer];Shop;Community Activity    Stability/Clinical Decision Making Stable/Uncomplicated    Rehab Potential Good    PT Frequency 2x / week    PT Duration 6 weeks    PT Treatment/Interventions ADLs/Self Care Home Management;Aquatic Therapy;Biofeedback;Cryotherapy;Electrical Stimulation;Iontophoresis 4mg /ml Dexamethasone;Moist Heat;Traction;Ultrasound;Parrafin;DME Instruction;Gait training;Functional mobility training;Therapeutic activities;Therapeutic exercise;Balance training;Neuromuscular re-education;Patient/family education;Orthotic Fit/Training;Manual techniques;Passive range of motion;Dry needling;Energy conservation;Splinting;Taping;Vasopneumatic Device;Spinal Manipulations;Joint Manipulations    PT Next Visit Plan Begin lunges/lunge walking.  Continue LE strengthing with emphasis on hips and quads; possibly assess dynamic balance;  Rotator cuff strengthing for R shoulder with isometrics and progress as able    PT Home Exercise Plan 8/5 STS; 08/11/20: SLS by counter or sink; 8/17: standing marching, hip abd and ext by counter; 8/31: UE isometrics 9/9 row, ext, ER walkout, band ER           Patient will benefit from skilled therapeutic intervention in order to improve the following deficits and impairments:  Abnormal gait, Decreased endurance, Decreased activity tolerance, Decreased balance, Decreased range of  motion, Decreased mobility, Decreased strength, Improper body mechanics, Pain, Impaired UE functional use  Visit Diagnosis: Other abnormalities of gait and mobility  Muscle weakness (generalized)  Right shoulder pain, unspecified chronicity  Other symptoms and signs involving the musculoskeletal system     Problem List Patient Active Problem List   Diagnosis Date Noted  . Abnormality of esophagus 01/27/2020  . Colon cancer screening 01/27/2020  . Esophageal dysphagia 02/08/2016  . Hemorrhoids 02/08/2016  . Chronic radicular cervical pain 07/29/2014  . GERD (gastroesophageal reflux disease) 10/14/2012  . Constipation 10/14/2012  . Stiffness of joint, not elsewhere classified, other specified site 02/21/2012  . PUD (peptic ulcer disease) 12/21/2011  . Dysphagia 07/25/2011  . Hyperlipidemia 08/12/2009  . ESSENTIAL HYPERTENSION, BENIGN 08/12/2009  . CAD, NATIVE VESSEL 08/12/2009  . CUTANEOUS ERUPTIONS, DRUG-INDUCED 01/09/2008  . LOCALIZED SUPERFICIAL SWELLING MASS OR LUMP 01/09/2008    12:05 PM, 09/13/20 Mearl Latin PT, DPT Physical Therapist at [place], [place], [address] Phone: [phone]   Fax:  [phone]  Name: [name] [name] MRN: [id number] Date of Birth: 03-05-1951

## 2020-09-15 ENCOUNTER — Encounter (HOSPITAL_COMMUNITY): Payer: Self-pay | Admitting: Physical Therapy

## 2020-09-15 ENCOUNTER — Ambulatory Visit (HOSPITAL_COMMUNITY): Payer: Medicare Other | Admitting: Physical Therapy

## 2020-09-15 ENCOUNTER — Other Ambulatory Visit: Payer: Self-pay

## 2020-09-15 DIAGNOSIS — R29898 Other symptoms and signs involving the musculoskeletal system: Secondary | ICD-10-CM | POA: Diagnosis not present

## 2020-09-15 DIAGNOSIS — M6281 Muscle weakness (generalized): Secondary | ICD-10-CM

## 2020-09-15 DIAGNOSIS — R2689 Other abnormalities of gait and mobility: Secondary | ICD-10-CM | POA: Diagnosis not present

## 2020-09-15 DIAGNOSIS — M25511 Pain in right shoulder: Secondary | ICD-10-CM | POA: Diagnosis not present

## 2020-09-15 NOTE — Therapy (Signed)
[facility] [address] [place], [place], [address] Phone: [phone]   Fax:  [phone]  Physical Therapy Treatment/Discharge Summary  Patient Details  Name: [name] [name] MRN: [id number] Date of Birth: 08/30/1951 Referring Provider (PT): Debby Bud MD   Encounter Date: 09/15/2020  PHYSICAL THERAPY DISCHARGE SUMMARY  Visits from Start of Care: 12  Current functional level related to goals / functional outcomes: See below   Remaining deficits: See below   Education / Equipment: See below  Plan: Patient agrees to discharge.  Patient goals were partially met. Patient is being discharged due to being pleased with the current functional level.  ?????       PT End of Session - 09/15/20 1122    Visit Number 12    Number of Visits 12    Date for PT Re-Evaluation 09/15/20    Authorization Type VA (15 visits max/aquatic therapy has also been approved)    Authorization - Visit Number 12    Authorization - Number of Visits 15    Progress Note Due on Visit 20    PT Start Time 1122    PT Stop Time 1153    PT Time Calculation (min) 31 min    Activity Tolerance Patient tolerated treatment well    Behavior During Therapy WFL for tasks assessed/performed           Past Medical History:  Diagnosis Date  . Allergy   . Anxiety   . Arthritis    back pain  . Avascular necrosis of bone of hip ([place])    right  . CAD (coronary artery disease)    stents x3 2008  . GERD (gastroesophageal reflux disease)   . History of nuclear stress test    a. Myoview 10/17: EF 56%, diaphragmatic attenuation, no ischemia, low risk  . HTN (hypertension)   . Hyperlipidemia   . Myocardial infarction ([place])    2008- stents x 3   . Paresthesias   . Sleep apnea    no cpap   . Thyroid disease     Past Surgical History:  Procedure Laterality Date  . CARDIAC CATHETERIZATION     3 stents placed  . COLONOSCOPY  2011 NUR [facility] SCREENING d50 v5    MILD [place] TICS, ? PREP ARTIFACT V. PROCTITIS-rX:[place]. next TCS 2021.  . ESOPHAGEAL DILATION N/A 03/02/2016   Procedure: ESOPHAGEAL DILATION;  Surgeon: Danie Binder, MD;  [facility];  Service: Endoscopy;  Laterality: N/A;  . ESOPHAGOGASTRODUODENOSCOPY  10/03/11   small hiatal hernia/gastric ulcers/stricutre in distal esophagus  . ESOPHAGOGASTRODUODENOSCOPY N/A 03/02/2016   Dr. Oneida Alar: Schatzki's ring at [place] junction s/p dilation, mild non-erosive gastritis/duodenitis   . ESOPHAGOGASTRODUODENOSCOPY (EGD) WITH PROPOFOL N/A 02/09/2020   Dr. Oneida Alar: Low-grade narrowing Schatzki ring status post dilation, moderate gastritis, no biopsies taken  . MASS EXCISION Left 09/30/2014   Procedure: EXCISION MASS LEFT WRIST ;  Surgeon: Daryll Brod, MD;  Location: [facility];  Service: Orthopedics;  Laterality: Left;  . ROTATOR CUFF REPAIR Left    [facility]   . SAVORY DILATION N/A 02/09/2020   Procedure: SAVORY DILATION;  Surgeon: Danie Binder, MD;  [facility];  Service: Endoscopy;  Laterality: N/A;  . TONSILLECTOMY  age 27  . TOTAL HIP ARTHROPLASTY  age 69   right hip for avascular necrosis of hip and spine  . TRIGGER FINGER RELEASE Left 03/08/2020   Procedure: RELEASE TRIGGER FINGER/A-1 PULLEY LEFT INDEX  FINGER;  Surgeon: Daryll Brod, MD;  Location: [facility];  Service: Orthopedics;  Laterality: Left;  IV REGIONAL FOREARM BLOCK  . UPPER GASTROINTESTINAL ENDOSCOPY     with dilation - last 02-2020     There were no vitals filed for this visit.   Subjective Assessment - 09/15/20 1121    Subjective Patient states 30% improvement with physical therapy intervention. His back is sore and he feels he needs a new bed. He is ready to make today his last day.    Pertinent History CAD, HTN, HLD, MI 2008,    Patient Stated Goals decrease shoulder pain and lift legs up higher with walking    Currently in Pain? No/denies              [facility] PT Assessment - 09/15/20  0001      Assessment   Medical Diagnosis Proximal Muscle Weakness    Referring Provider (PT) Debby Bud MD    Onset Date/Surgical Date 08/05/19    Next MD Visit Jan 2022    Prior Therapy Yes L shoulder, R Hip, low back      Precautions   Precautions None      Restrictions   Weight Bearing Restrictions No      Balance Screen   Has the patient fallen in the past 6 months No    Has the patient had a decrease in activity level because of a fear of falling?  No    Is the patient reluctant to leave their home because of a fear of falling?  No      Prior Function   Level of Independence Independent    Vocation Retired      Charity fundraiser Status Within Functional Limits for tasks assessed      Observation/Other Assessments   Observations Ambulates without AD    Focus on Therapeutic Outcomes (FOTO)  No FOTO      Posture/Postural Control   Posture/Postural Control Postural limitations    Postural Limitations Rounded Shoulders;Forward head      Strength   Right Shoulder Flexion 4/5    Right Shoulder ABduction 4-/5    Right Shoulder Internal Rotation 4+/5    Right Shoulder External Rotation 4/5    Right Hip Flexion 4-/5    Left Hip Flexion 4-/5    Right Knee Flexion 4+/5    Right Knee Extension 4+/5    Left Knee Flexion 4+/5    Left Knee Extension 4/5    Right Ankle Dorsiflexion 4+/5    Left Ankle Dorsiflexion 4+/5                         [facility] Adult PT Treatment/Exercise - 09/15/20 0001      Knee/Hip Exercises: Standing   Other Standing Knee Exercises ball stabilization at wall with little red ball ABCs 2x; shoulder ER with scapular retraction 2x10 with red band                  PT Education - 09/15/20 1121    Education Details Exercise technique, continue HEP, Progress made    Person(s) Educated Patient    Methods Explanation;Demonstration    Comprehension Verbalized understanding;Returned demonstration            PT  Short Term Goals - 09/15/20 1132      PT SHORT TERM GOAL #1   Title Patient will be independent with HEP in order to improve functional  outcomes.    Time 3    Period Weeks    Status Not Met    Target Date 08/25/20      PT SHORT TERM GOAL #2   Title Patient will report at least 25% improvement in symptoms for improved quality of life.    Time 3    Period Weeks    Status Achieved    Target Date 08/25/20             PT Long Term Goals - 09/15/20 1132      PT LONG TERM GOAL #1   Title Patient will report at least 75% improvement in symptoms for improved quality of life.    Time 6    Period Weeks    Status Not Met      PT LONG TERM GOAL #2   Title Patient will be able to complete 5x STS in under 11.4 seconds in order to reduce the risk of falls.    Time 6    Period Weeks    Status Achieved      PT LONG TERM GOAL #3   Title Patient will improve bilateral LE and R shoulder strength by at least 1/2 grade in each plane assessed for improved strength with ADL.    Time 6    Period Weeks    Status Achieved                 Plan - 09/15/20 1122    Clinical Impression Statement Patient has met 1/2 short term goals with improvement in symptoms. Patient has met 2/3 long term goals with improved strength and 5x STS. Remaining goals not met due to continued symptoms/strength deficits and inconsistency with HEP. Patient showing improved strength and ROM as well as functional mobility compared to initial evaluation day. Patient continues to show Patient educated on progress made, continuing HEP, and returning to physical therapy if needed. Patient shoulder fatigues quickly with stabilization exercises at wall and with shoulder ER exercise. Patient discharged from physical therapy at this time.    Personal Factors and Comorbidities Comorbidity 3+;Age;Time since onset of injury/illness/exacerbation;Past/Current Experience    Comorbidities CAD, HTN, HLD, MI 2008    Examination-Activity  Limitations Carry;Lift;Locomotion Level;Squat;Stairs;Stand;Transfers    Examination-Participation Restrictions [employer];Shop;Community Activity    Stability/Clinical Decision Making Stable/Uncomplicated    Rehab Potential Good    PT Frequency --    PT Duration --    PT Treatment/Interventions ADLs/Self Care Home Management;Aquatic Therapy;Biofeedback;Cryotherapy;Electrical Stimulation;Iontophoresis 62m/ml Dexamethasone;Moist Heat;Traction;Ultrasound;Parrafin;DME Instruction;Gait training;Functional mobility training;Therapeutic activities;Therapeutic exercise;Balance training;Neuromuscular re-education;Patient/family education;Orthotic Fit/Training;Manual techniques;Passive range of motion;Dry needling;Energy conservation;Splinting;Taping;Vasopneumatic Device;Spinal Manipulations;Joint Manipulations    PT Next Visit Plan n/a    PT Home Exercise Plan 8/5 STS; 08/11/20: SLS by counter or sink; 8/17: standing marching, hip abd and ext by counter; 8/31: UE isometrics 9/9 row, ext, ER walkout, band ER 9/16 ball stabs at wall, shoulder ER with band           Patient will benefit from skilled therapeutic intervention in order to improve the following deficits and impairments:  Abnormal gait, Decreased endurance, Decreased activity tolerance, Decreased balance, Decreased range of motion, Decreased mobility, Decreased strength, Improper body mechanics, Pain, Impaired UE functional use  Visit Diagnosis: Other abnormalities of gait and mobility  Muscle weakness (generalized)  Right shoulder pain, unspecified chronicity  Other symptoms and signs involving the musculoskeletal system     Problem List Patient Active Problem List   Diagnosis Date Noted  . Abnormality of esophagus 01/27/2020  .  Colon cancer screening 01/27/2020  . Esophageal dysphagia 02/08/2016  . Hemorrhoids 02/08/2016  . Chronic radicular cervical pain 07/29/2014  . GERD (gastroesophageal reflux disease) 10/14/2012   . Constipation 10/14/2012  . Stiffness of joint, not elsewhere classified, other specified site 02/21/2012  . PUD (peptic ulcer disease) 12/21/2011  . Dysphagia 07/25/2011  . Hyperlipidemia 08/12/2009  . ESSENTIAL HYPERTENSION, BENIGN 08/12/2009  . CAD, NATIVE VESSEL 08/12/2009  . CUTANEOUS ERUPTIONS, DRUG-INDUCED 01/09/2008  . LOCALIZED SUPERFICIAL SWELLING MASS OR LUMP 01/09/2008   12:04 PM, 09/15/20 Mearl Latin PT, DPT Physical Therapist at [facility] [place], [place], [address] Phone: [phone]   Fax:  [phone]  Name: BONNY EGGER MRN: [id number] Date of Birth: 13-May-1951

## 2020-09-29 DIAGNOSIS — R5382 Chronic fatigue, unspecified: Secondary | ICD-10-CM | POA: Diagnosis not present

## 2020-09-29 DIAGNOSIS — I10 Essential (primary) hypertension: Secondary | ICD-10-CM | POA: Diagnosis not present

## 2020-09-29 DIAGNOSIS — E039 Hypothyroidism, unspecified: Secondary | ICD-10-CM | POA: Diagnosis not present

## 2020-09-29 DIAGNOSIS — E7849 Other hyperlipidemia: Secondary | ICD-10-CM | POA: Diagnosis not present

## 2020-10-29 DIAGNOSIS — I10 Essential (primary) hypertension: Secondary | ICD-10-CM | POA: Diagnosis not present

## 2020-10-29 DIAGNOSIS — R5382 Chronic fatigue, unspecified: Secondary | ICD-10-CM | POA: Diagnosis not present

## 2020-10-29 DIAGNOSIS — E039 Hypothyroidism, unspecified: Secondary | ICD-10-CM | POA: Diagnosis not present

## 2020-10-29 DIAGNOSIS — E7849 Other hyperlipidemia: Secondary | ICD-10-CM | POA: Diagnosis not present

## 2020-11-16 DIAGNOSIS — M791 Myalgia, unspecified site: Secondary | ICD-10-CM | POA: Diagnosis not present

## 2020-11-16 DIAGNOSIS — E039 Hypothyroidism, unspecified: Secondary | ICD-10-CM | POA: Diagnosis not present

## 2020-11-16 DIAGNOSIS — I10 Essential (primary) hypertension: Secondary | ICD-10-CM | POA: Diagnosis not present

## 2020-11-16 DIAGNOSIS — Z6825 Body mass index (BMI) 25.0-25.9, adult: Secondary | ICD-10-CM | POA: Diagnosis not present

## 2020-11-16 DIAGNOSIS — Z1331 Encounter for screening for depression: Secondary | ICD-10-CM | POA: Diagnosis not present

## 2020-11-16 DIAGNOSIS — I2510 Atherosclerotic heart disease of native coronary artery without angina pectoris: Secondary | ICD-10-CM | POA: Diagnosis not present

## 2020-11-16 DIAGNOSIS — Z0001 Encounter for general adult medical examination with abnormal findings: Secondary | ICD-10-CM | POA: Diagnosis not present

## 2020-11-29 DIAGNOSIS — R5382 Chronic fatigue, unspecified: Secondary | ICD-10-CM | POA: Diagnosis not present

## 2020-11-29 DIAGNOSIS — E782 Mixed hyperlipidemia: Secondary | ICD-10-CM | POA: Diagnosis not present

## 2020-11-29 DIAGNOSIS — E039 Hypothyroidism, unspecified: Secondary | ICD-10-CM | POA: Diagnosis not present

## 2020-11-29 DIAGNOSIS — I10 Essential (primary) hypertension: Secondary | ICD-10-CM | POA: Diagnosis not present

## 2020-12-12 DIAGNOSIS — R7303 Prediabetes: Secondary | ICD-10-CM | POA: Diagnosis not present

## 2020-12-12 DIAGNOSIS — R7309 Other abnormal glucose: Secondary | ICD-10-CM | POA: Diagnosis not present

## 2020-12-12 DIAGNOSIS — I2510 Atherosclerotic heart disease of native coronary artery without angina pectoris: Secondary | ICD-10-CM | POA: Diagnosis not present

## 2020-12-30 DIAGNOSIS — E782 Mixed hyperlipidemia: Secondary | ICD-10-CM | POA: Diagnosis not present

## 2020-12-30 DIAGNOSIS — R5382 Chronic fatigue, unspecified: Secondary | ICD-10-CM | POA: Diagnosis not present

## 2020-12-30 DIAGNOSIS — E039 Hypothyroidism, unspecified: Secondary | ICD-10-CM | POA: Diagnosis not present

## 2020-12-30 DIAGNOSIS — I10 Essential (primary) hypertension: Secondary | ICD-10-CM | POA: Diagnosis not present

## 2021-01-02 ENCOUNTER — Telehealth: Payer: Self-pay | Admitting: Pharmacist

## 2021-01-02 MED ORDER — PRALUENT 75 MG/ML ~~LOC~~ SOAJ: 1.0000 "pen " | SUBCUTANEOUS | 11 refills | Status: DC

## 2021-01-02 NOTE — Telephone Encounter (Addendum)
Praluent is no longer considered a specialty med by patient's insurance and can no longer fill at [facility] where pt was previously required to fill per insurance.  Spoke with pt and he is ok with [place] sending Praluent refill to [facility] on file [facility] is unable to [employer]), refill sent in.

## 2021-01-19 NOTE — Progress Notes (Signed)
Cardiology Office Note   Date:  01/20/2021   ID:  [name] [name], [name] [name] 1951-07-27, MRN [id number]  PCP:  Sharilyn Sites, MD  Cardiologist:   Dorris Carnes, MD   F/U CAD      History of Present Illness: [name] [name] is a 70 y.o. male with a history ofCAD, HTN, PV disease, and HLDseen for numbness and tingling.   History of CAD s/p NSTEMI in 2008 tx with PCI of bifurcational lesion in LAD/Dx with 3DES.He has a hx of rash with Plavix and Ticlid. He underwent Plavix desensitization in 2009. Intolerance to [place] (caused nightmares).Low risk stress test 09/2016.  The pt had limited episode of exertional CP in AUg/2020.  SHort lived  F/U stress test low risk  No ischemia   I saw the pt in May 2021  Since seen the pt says he just had neck surgery   Since that time discomfort in back and chest have gone   No tingling  No discomfort  Breathing OK   The pt says on Repatha he had very  vivid dreasms   Went to [employer]  Again, dreams started   He is willing to try one more time  Breathing OK   Again, no CP     Current Meds  Medication Sig   acetaminophen (TYLENOL) 500 MG tablet Take 1,000 mg by mouth as needed for moderate pain.    Alirocumab (PRALUENT) 75 MG/ML SOAJ Inject 1 pen into the skin every 14 (fourteen) days.   aspirin EC 81 MG tablet Take 1 tablet (81 mg total) by mouth daily.   calcium carbonate (TUMS EX) 750 MG chewable tablet Chew 1 tablet by mouth 2 (two) times daily as needed for heartburn.    carvedilol (COREG) 3.125 MG tablet Take 1 tablet (3.125 mg total) by mouth 2 (two) times daily with a meal.   ezetimibe (ZETIA) 10 MG tablet Take 5 mg by mouth daily.   famotidine (PEPCID) 20 MG tablet Take 1 tablet (20 mg total) by mouth 2 (two) times daily.   hydrochlorothiazide (HYDRODIURIL) 25 MG tablet Take 12.5 mg by mouth daily.   ibuprofen (ADVIL) 200 MG tablet Take 200 mg by mouth every 6 (six) hours as needed. Takes 2 tablets as needed.    ketotifen (ZADITOR) 0.025 % ophthalmic solution Place 1 drop into both eyes daily as needed (itchy eyes).   levothyroxine (SYNTHROID, LEVOTHROID) 75 MCG tablet Take 75 mcg by mouth daily before breakfast.   losartan (COZAAR) 100 MG tablet Take 50 mg by mouth 2 (two) times daily.   nitroGLYCERIN (NITROSTAT) 0.4 MG SL tablet Place 1 tablet (0.4 mg total) under the tongue every 5 (five) minutes as needed for chest pain. X 3 doses   pantoprazole (PROTONIX) 40 MG tablet Take 40 mg by mouth in the morning and at bedtime.    pravastatin (PRAVACHOL) 40 MG tablet Take 20 mg by mouth at bedtime.   zolpidem (AMBIEN) 10 MG tablet Take 10 mg by mouth at bedtime as needed.     Allergies:   Atorvastatin, Bee venom, Clopidogrel bisulfate, Pravastatin, Rosuvastatin, Penicillins, Adhesive [tape], Clopidogrel, Omeprazole, and Repatha [evolocumab]   Past Medical History:  Diagnosis Date   Allergy    Anxiety    Arthritis    back pain   Avascular necrosis of bone of hip ([place])    right   CAD (coronary artery disease)    stents x3 2008   GERD (gastroesophageal reflux disease)  History of nuclear stress test    a. Myoview 10/17: EF 56%, diaphragmatic attenuation, no ischemia, low risk   HTN (hypertension)    Hyperlipidemia    Myocardial infarction ([place])    2008- stents x 3    Paresthesias    Sleep apnea    no cpap    Thyroid disease     Past Surgical History:  Procedure Laterality Date   CARDIAC CATHETERIZATION     3 stents placed   COLONOSCOPY  2011 NUR [facility] SCREENING d50 v5   MILD [place] TICS, ? PREP ARTIFACT V. PROCTITIS-rX:[place]. next TCS 2021.   ESOPHAGEAL DILATION N/A 03/02/2016   Procedure: ESOPHAGEAL DILATION;  Surgeon: Danie Binder, MD;  [facility];  Service: Endoscopy;  Laterality: N/A;   ESOPHAGOGASTRODUODENOSCOPY  10/03/11   small hiatal hernia/gastric ulcers/stricutre in distal esophagus   ESOPHAGOGASTRODUODENOSCOPY N/A 03/02/2016   Dr. Oneida Alar:  Schatzki's ring at [place] junction s/p dilation, mild non-erosive gastritis/duodenitis    ESOPHAGOGASTRODUODENOSCOPY (EGD) WITH PROPOFOL N/A 02/09/2020   Dr. Oneida Alar: Low-grade narrowing Schatzki ring status post dilation, moderate gastritis, no biopsies taken   MASS EXCISION Left 09/30/2014   Procedure: EXCISION MASS LEFT WRIST ;  Surgeon: Daryll Brod, MD;  Location: [facility];  Service: Orthopedics;  Laterality: Left;   ROTATOR CUFF REPAIR Left    [facility]    SAVORY DILATION N/A 02/09/2020   Procedure: SAVORY DILATION;  Surgeon: Danie Binder, MD;  [facility];  Service: Endoscopy;  Laterality: N/A;   TONSILLECTOMY  age 49   TOTAL HIP ARTHROPLASTY  age 8   right hip for avascular necrosis of hip and spine   TRIGGER FINGER RELEASE Left 03/08/2020   Procedure: RELEASE TRIGGER FINGER/A-1 PULLEY LEFT INDEX FINGER;  Surgeon: Daryll Brod, MD;  Location: [facility];  Service: Orthopedics;  Laterality: Left;  IV REGIONAL FOREARM BLOCK   UPPER GASTROINTESTINAL ENDOSCOPY     with dilation - last 02-2020      Social History:  The patient  reports that he quit smoking about 13 years ago. His smoking use included cigarettes. He has a 30.00 pack-year smoking history. He has never used smokeless tobacco. He reports that he does not drink alcohol and does not use drugs.   Family History:  The patient's family history includes Dementia in his mother; Lung cancer (age of onset: 44) in his sister; Prostate cancer (age of onset: 78) in his father.    ROS:  Please see the history of present illness. All other systems are reviewed and  Negative to the above problem except as noted.    PHYSICAL EXAM: VS:  BP 132/82    Pulse (!) 54    Ht 6' (1.829 m)    Wt 181 lb 6.4 oz (82.3 kg)    SpO2 97%    BMI 24.60 kg/m   GEN: Well nourished, well developed, in no acute distress  HEENT: normal  Neck: no JVD, carotid bruits,  Mild bruise on R neck Cardiac: RRR; no murmurs  NO  LE  edema  Respiratory:  clear to auscultation bilaterally,   GI: soft, nontender, nondistended, + BS  No hepatomegaly  MS: no deformity Moving all extremities   Skin: warm and dry, no rash   Neuro:  Strength and sensation are intact Psych: euthymic mood, full affect   EKG:  EKG is ordered today.  SB   55 bpm     Lipid Panel    Component  Value Date/Time   CHOL 142 05/23/2020 1117   TRIG 79 05/23/2020 1117   TRIG 107 08/15/2009 1335   HDL 35 (L) 05/23/2020 1117   CHOLHDL 4.1 05/23/2020 1117   CHOLHDL 4.5 01/30/2016 1005   VLDL 24 01/30/2016 1005   LDLCALC 92 05/23/2020 1117   LDLCALC 87 08/15/2009 1335      Wt Readings from Last 3 Encounters:  01/20/21 181 lb 6.4 oz (82.3 kg)  07/11/20 174 lb (78.9 kg)  07/01/20 174 lb (78.9 kg)      ASSESSMENT AND PLAN:  1  CAD  Pt with remote intervention  He is doing good without symptoms of agnina Follow   2  HL   Off Praluent may have caused dreams (vivid)  Stopped  Would recomm Pravastatin and Zetia (40 and 10) Will try Praluent when fully recovers from neck surgery   Start lipids in June 2022   3  HTN  BP is controlled   Keep on same meds     Keep active    F/U in November    Current medicines are reviewed at length with the patient today.  The patient does not have concerns regarding medicines.  Signed, Dorris Carnes, MD  01/20/2021 1:41 PM    [facility] [place], [place], [place]  [address] Phone: [phone]; Fax: [phone]

## 2021-01-20 ENCOUNTER — Ambulatory Visit (INDEPENDENT_AMBULATORY_CARE_PROVIDER_SITE_OTHER): Payer: Medicare Other | Admitting: Internal Medicine

## 2021-01-20 ENCOUNTER — Encounter: Payer: Self-pay | Admitting: Internal Medicine

## 2021-01-20 ENCOUNTER — Other Ambulatory Visit: Payer: Self-pay

## 2021-01-20 VITALS — BP 132/82 | HR 54 | Ht 72.0 in | Wt 181.4 lb

## 2021-01-20 DIAGNOSIS — I2510 Atherosclerotic heart disease of native coronary artery without angina pectoris: Secondary | ICD-10-CM | POA: Diagnosis not present

## 2021-01-20 NOTE — Patient Instructions (Signed)
Medication Instructions:  No changes *If you need a refill on your cardiac medications before your next appointment, please call your pharmacy*   Lab Work: Plan to return for lipid panel in June 2022.  If you have labs (blood work) drawn today and your tests are completely normal, you will receive your results only by: Marland Kitchen MyChart Message (if you have MyChart) OR . A paper copy in the mail If you have any lab test that is abnormal or we need to change your treatment, we will call you to review the results.   Testing/Procedures: None ordered   Follow-Up: At [facility], you and your health needs are our priority.  As part of our continuing mission to provide you with exceptional heart care, we have created designated Provider Care Teams.  These Care Teams include your primary Cardiologist (physician) and Advanced Practice Providers (APPs -  Physician Assistants and Nurse Practitioners) who all work together to provide you with the care you need, when you need it.  We recommend signing up for the patient portal called "MyChart".  Sign up information is provided on this After Visit Summary.  MyChart is used to connect with patients for Virtual Visits (Telemedicine).  Patients are able to view lab/test results, encounter notes, upcoming appointments, etc.  Non-urgent messages can be sent to your provider as well.   To learn more about what you can do with MyChart, go to [email or web address].    Your next appointment:   10 month(s)  The format for your next appointment:   In Person  Provider:   You may see Dorris Carnes, MD or one of the following Advanced Practice Providers on your designated Care Team:    Richardson Dopp, PA-C  Robbie Lis, [place]   Other Instructions

## 2021-01-23 DIAGNOSIS — Z4889 Encounter for other specified surgical aftercare: Secondary | ICD-10-CM | POA: Insufficient documentation

## 2021-01-23 DIAGNOSIS — Z981 Arthrodesis status: Secondary | ICD-10-CM | POA: Insufficient documentation

## 2021-01-28 DIAGNOSIS — I10 Essential (primary) hypertension: Secondary | ICD-10-CM | POA: Diagnosis not present

## 2021-01-28 DIAGNOSIS — R5382 Chronic fatigue, unspecified: Secondary | ICD-10-CM | POA: Diagnosis not present

## 2021-01-28 DIAGNOSIS — E039 Hypothyroidism, unspecified: Secondary | ICD-10-CM | POA: Diagnosis not present

## 2021-01-28 DIAGNOSIS — E782 Mixed hyperlipidemia: Secondary | ICD-10-CM | POA: Diagnosis not present

## 2021-02-01 DIAGNOSIS — E039 Hypothyroidism, unspecified: Secondary | ICD-10-CM | POA: Diagnosis not present

## 2021-02-01 DIAGNOSIS — E663 Overweight: Secondary | ICD-10-CM | POA: Diagnosis not present

## 2021-02-01 DIAGNOSIS — Z6825 Body mass index (BMI) 25.0-25.9, adult: Secondary | ICD-10-CM | POA: Diagnosis not present

## 2021-02-22 DIAGNOSIS — Z23 Encounter for immunization: Secondary | ICD-10-CM | POA: Diagnosis not present

## 2021-03-20 DIAGNOSIS — M47812 Spondylosis without myelopathy or radiculopathy, cervical region: Secondary | ICD-10-CM | POA: Diagnosis not present

## 2021-03-20 DIAGNOSIS — Z4789 Encounter for other orthopedic aftercare: Secondary | ICD-10-CM | POA: Diagnosis not present

## 2021-03-20 DIAGNOSIS — Z981 Arthrodesis status: Secondary | ICD-10-CM | POA: Diagnosis not present

## 2021-03-20 DIAGNOSIS — M48061 Spinal stenosis, lumbar region without neurogenic claudication: Secondary | ICD-10-CM | POA: Diagnosis not present

## 2021-03-20 DIAGNOSIS — M4316 Spondylolisthesis, lumbar region: Secondary | ICD-10-CM | POA: Diagnosis not present

## 2021-03-23 ENCOUNTER — Other Ambulatory Visit: Payer: Self-pay

## 2021-03-23 ENCOUNTER — Encounter (HOSPITAL_COMMUNITY): Payer: Self-pay

## 2021-03-23 ENCOUNTER — Emergency Department (HOSPITAL_COMMUNITY)
Admission: EM | Admit: 2021-03-23 | Discharge: 2021-03-23 | Disposition: A | Discharge status: Home / Self Care | Payer: Medicare Other | Attending: Emergency Medicine | Admitting: Emergency Medicine

## 2021-03-23 DIAGNOSIS — Z96641 Presence of right artificial hip joint: Secondary | ICD-10-CM | POA: Insufficient documentation

## 2021-03-23 DIAGNOSIS — I2510 Atherosclerotic heart disease of native coronary artery without angina pectoris: Secondary | ICD-10-CM | POA: Diagnosis not present

## 2021-03-23 DIAGNOSIS — T7840XA Allergy, unspecified, initial encounter: Secondary | ICD-10-CM | POA: Diagnosis not present

## 2021-03-23 DIAGNOSIS — Z79899 Other long term (current) drug therapy: Secondary | ICD-10-CM | POA: Diagnosis not present

## 2021-03-23 DIAGNOSIS — Z7982 Long term (current) use of aspirin: Secondary | ICD-10-CM | POA: Diagnosis not present

## 2021-03-23 DIAGNOSIS — Z85038 Personal history of other malignant neoplasm of large intestine: Secondary | ICD-10-CM | POA: Diagnosis not present

## 2021-03-23 DIAGNOSIS — I10 Essential (primary) hypertension: Secondary | ICD-10-CM | POA: Diagnosis not present

## 2021-03-23 DIAGNOSIS — Z951 Presence of aortocoronary bypass graft: Secondary | ICD-10-CM | POA: Diagnosis not present

## 2021-03-23 DIAGNOSIS — Z87891 Personal history of nicotine dependence: Secondary | ICD-10-CM | POA: Insufficient documentation

## 2021-03-23 MED ORDER — FAMOTIDINE IN NACL 20-0.9 MG/50ML-% IV SOLN
20.0000 mg | Freq: Once | INTRAVENOUS | Status: AC
  Administered 2021-03-23: 20 mg via INTRAVENOUS
  Filled 2021-03-23: qty 50

## 2021-03-23 MED ORDER — DEXAMETHASONE SODIUM PHOSPHATE 10 MG/ML IJ SOLN
10.0000 mg | Freq: Once | INTRAMUSCULAR | Status: AC
  Administered 2021-03-23: 10 mg via INTRAVENOUS
  Filled 2021-03-23: qty 1

## 2021-03-23 MED ORDER — EPINEPHRINE 0.3 MG/0.3ML IJ SOAJ: 0.3000 mg | INTRAMUSCULAR | 0 refills | Status: AC | PRN

## 2021-03-23 MED ORDER — SODIUM CHLORIDE 0.9 % IV SOLN: Freq: Once | INTRAVENOUS | Status: AC

## 2021-03-23 MED ORDER — DIPHENHYDRAMINE HCL 50 MG/ML IJ SOLN
25.0000 mg | Freq: Once | INTRAMUSCULAR | Status: AC
  Administered 2021-03-23: 25 mg via INTRAVENOUS
  Filled 2021-03-23: qty 1

## 2021-03-23 NOTE — ED Triage Notes (Signed)
Pt states that we got a CT scan yesterday with contrast and how has a itchy rash on his chest and torso. Pt states that it took benadryl at home before he came. States his throat is dry.

## 2021-03-23 NOTE — Discharge Instructions (Signed)
Take 1 Benadryl tablet every 6 hours for the next 1 to 2 days, then as needed.  If you have tongue swelling, throat swelling, shortness of breath, abdominal pain, nausea or vomiting, return to the ER immediately.

## 2021-03-23 NOTE — ED Notes (Signed)
Pt has rash on his chest and torso. Pt states that it is itchy. Pt is talking and is not complaining of difficulty breathing at this time. Pt told to push call light if any changes occur. Will continue to monitor pt.

## 2021-03-23 NOTE — ED Provider Notes (Signed)
[facility] EMERGENCY DEPARTMENT Provider Note   CSN: [id number] Arrival date & time: 03/23/21  0416     History Chief Complaint  Patient presents with  . Allergic Reaction    [name] [name] is a 70 y.o. male.  Patient reports that he woke up just prior to coming to the ER and noticed that he had an itchy rash all over his torso.  No tongue swelling, throat swelling or difficulty breathing.  No abdominal pain, nausea or vomiting.  Patient reports that he had a CAT scan with IV contrast yesterday afternoon.  No other new contacts.        Past Medical History:  Diagnosis Date  . Allergy   . Anxiety   . Arthritis    back pain  . Avascular necrosis of bone of hip ([place])    right  . CAD (coronary artery disease)    stents x3 2008  . GERD (gastroesophageal reflux disease)   . History of nuclear stress test    a. Myoview 10/17: EF 56%, diaphragmatic attenuation, no ischemia, low risk  . HTN (hypertension)   . Hyperlipidemia   . Myocardial infarction ([place])    2008- stents x 3   . Paresthesias   . Sleep apnea    no cpap   . Thyroid disease     Patient Active Problem List   Diagnosis Date Noted  . Abnormality of esophagus 01/27/2020  . Colon cancer screening 01/27/2020  . Esophageal dysphagia 02/08/2016  . Hemorrhoids 02/08/2016  . Chronic radicular cervical pain 07/29/2014  . GERD (gastroesophageal reflux disease) 10/14/2012  . Constipation 10/14/2012  . Stiffness of joint, not elsewhere classified, other specified site 02/21/2012  . PUD (peptic ulcer disease) 12/21/2011  . Dysphagia 07/25/2011  . Hyperlipidemia 08/12/2009  . ESSENTIAL HYPERTENSION, BENIGN 08/12/2009  . CAD, NATIVE VESSEL 08/12/2009  . CUTANEOUS ERUPTIONS, DRUG-INDUCED 01/09/2008  . LOCALIZED SUPERFICIAL SWELLING MASS OR LUMP 01/09/2008    Past Surgical History:  Procedure Laterality Date  . CARDIAC CATHETERIZATION     3 stents placed  . COLONOSCOPY  2011 NUR [facility] SCREENING d50 v5   MILD  [place] TICS, ? PREP ARTIFACT V. PROCTITIS-rX:[place]. next TCS 2021.  . ESOPHAGEAL DILATION N/A 03/02/2016   Procedure: ESOPHAGEAL DILATION;  Surgeon: Danie Binder, MD;  [facility];  Service: Endoscopy;  Laterality: N/A;  . ESOPHAGOGASTRODUODENOSCOPY  10/03/11   small hiatal hernia/gastric ulcers/stricutre in distal esophagus  . ESOPHAGOGASTRODUODENOSCOPY N/A 03/02/2016   Dr. Oneida Alar: Schatzki's ring at [place] junction s/p dilation, mild non-erosive gastritis/duodenitis   . ESOPHAGOGASTRODUODENOSCOPY (EGD) WITH PROPOFOL N/A 02/09/2020   Dr. Oneida Alar: Low-grade narrowing Schatzki ring status post dilation, moderate gastritis, no biopsies taken  . MASS EXCISION Left 09/30/2014   Procedure: EXCISION MASS LEFT WRIST ;  Surgeon: Daryll Brod, MD;  Location: [place];  Service: Orthopedics;  Laterality: Left;  . ROTATOR CUFF REPAIR Left    [facility]   . SAVORY DILATION N/A 02/09/2020   Procedure: SAVORY DILATION;  Surgeon: Danie Binder, MD;  [facility];  Service: Endoscopy;  Laterality: N/A;  . TONSILLECTOMY  age 10  . TOTAL HIP ARTHROPLASTY  age 47   right hip for avascular necrosis of hip and spine  . TRIGGER FINGER RELEASE Left 03/08/2020   Procedure: RELEASE TRIGGER FINGER/A-1 PULLEY LEFT INDEX FINGER;  Surgeon: Daryll Brod, MD;  Location: [place];  Service: Orthopedics;  Laterality: Left;  IV REGIONAL FOREARM BLOCK  .  UPPER GASTROINTESTINAL ENDOSCOPY     with dilation - last 02-2020        Family History  Problem Relation Age of Onset  . Dementia Mother   . Prostate cancer Father 58  . Lung cancer Sister 34  . Colon cancer Neg Hx   . Colon polyps Neg Hx   . Anesthesia problems Neg Hx   . Hypotension Neg Hx   . Malignant hyperthermia Neg Hx   . Pseudochol deficiency Neg Hx   . Esophageal cancer Neg Hx   . Stomach cancer Neg Hx   . Rectal cancer Neg Hx     Social History   Tobacco Use  . Smoking status: Former Smoker    Packs/day: 1.00     Years: 30.00    Pack years: 30.00    Types: Cigarettes    Quit date: 09/30/2007    Years since quitting: 13.4  . Smokeless tobacco: Never Used  Vaping Use  . Vaping Use: Never used  Substance Use Topics  . Alcohol use: No  . Drug use: No    Home Medications Prior to Admission medications   Medication Sig Start Date End Date Taking? Authorizing Provider  EPINEPHrine 0.3 mg/0.3 mL IJ SOAJ injection Inject 0.3 mg into the muscle as needed for anaphylaxis. 03/23/21  Yes Sherard Sutch, Gwenyth Allegra, MD  acetaminophen (TYLENOL) 500 MG tablet Take 1,000 mg by mouth as needed for moderate pain.     [provider]  Alirocumab (PRALUENT) 75 MG/ML SOAJ Inject 1 pen into the skin every 14 (fourteen) days. 01/02/21   Fay Records, MD  aspirin EC 81 MG tablet Take 1 tablet (81 mg total) by mouth daily. 01/24/18   Fay Records, MD  calcium carbonate (TUMS EX) 750 MG chewable tablet Chew 1 tablet by mouth 2 (two) times daily as needed for heartburn.     [provider]  carvedilol (COREG) 3.125 MG tablet Take 1 tablet (3.125 mg total) by mouth 2 (two) times daily with a meal. 09/14/19   Bhagat, Bhavinkumar, PA  ezetimibe (ZETIA) 10 MG tablet Take 5 mg by mouth daily.    [provider]  famotidine (PEPCID) 20 MG tablet Take 1 tablet (20 mg total) by mouth 2 (two) times daily. 06/04/16   Annitta Needs, NP  hydrochlorothiazide (HYDRODIURIL) 25 MG tablet Take 12.5 mg by mouth daily. 07/01/19   [provider]  ibuprofen (ADVIL) 200 MG tablet Take 200 mg by mouth every 6 (six) hours as needed. Takes 2 tablets as needed.    [provider]  ketotifen (ZADITOR) 0.025 % ophthalmic solution Place 1 drop into both eyes daily as needed (itchy eyes).    [provider]  levothyroxine (SYNTHROID, LEVOTHROID) 75 MCG tablet Take 75 mcg by mouth daily before breakfast.    [provider]  losartan (COZAAR) 100 MG tablet Take 50 mg by mouth 2 (two) times daily.  07/09/19   [provider]  pantoprazole (PROTONIX) 40 MG tablet Take 40 mg by mouth in the morning and at bedtime.  03/27/19   [provider]  pravastatin (PRAVACHOL) 40 MG tablet Take 20 mg by mouth at bedtime.    [provider]  zolpidem (AMBIEN) 10 MG tablet Take 10 mg by mouth at bedtime as needed. 05/26/20   [provider]    Allergies    Atorvastatin, Bee venom, Clopidogrel bisulfate, Pravastatin, Rosuvastatin, Penicillins, Contrast media [iodinated diagnostic agents], Adhesive [tape], Clopidogrel, Omeprazole, and Repatha [  evolocumab]  Review of Systems   Review of Systems  Skin: Positive for rash.  All other systems reviewed and are negative.   Physical Exam Updated Vital Signs BP (!) 145/94   Pulse 73   Temp 98.1 F (36.7 C) (Oral)   Resp 18   Ht 6' (1.829 m)   Wt 82.3 kg   SpO2 100%   BMI 24.61 kg/m   Physical Exam Vitals and nursing note reviewed.  Constitutional:      General: He is not in acute distress.    Appearance: Normal appearance. He is well-developed.  HENT:     Head: Normocephalic and atraumatic.     Right Ear: Hearing normal.     Left Ear: Hearing normal.     Nose: Nose normal.  Eyes:     Conjunctiva/sclera: Conjunctivae normal.     Pupils: Pupils are equal, round, and reactive to light.  Cardiovascular:     Rate and Rhythm: Regular rhythm.     Heart sounds: S1 normal and S2 normal. No murmur heard. No friction rub. No gallop.   Pulmonary:     Effort: Pulmonary effort is normal. No respiratory distress.     Breath sounds: Normal breath sounds.  Chest:     Chest wall: No tenderness.  Abdominal:     General: Bowel sounds are normal.     Palpations: Abdomen is soft.     Tenderness: There is no abdominal tenderness. There is no guarding or rebound. Negative signs include Murphy's sign and McBurney's sign.     Hernia: No hernia is present.  Musculoskeletal:        General: Normal range of motion.      Cervical back: Normal range of motion and neck supple.  Skin:    General: Skin is warm and dry.     Findings: Rash (urticaria on torso) present.  Neurological:     Mental Status: He is alert and oriented to person, place, and time.     GCS: GCS eye subscore is 4. GCS verbal subscore is 5. GCS motor subscore is 6.     Cranial Nerves: No cranial nerve deficit.     Sensory: No sensory deficit.     Coordination: Coordination normal.  Psychiatric:        Speech: Speech normal.        Behavior: Behavior normal.        Thought Content: Thought content normal.     ED Results / Procedures / Treatments   Labs (all labs ordered are listed, but only abnormal results are displayed) Labs Reviewed - No data to display  EKG None  Radiology No results found.  Procedures Procedures   Medications Ordered in ED Medications  0.9 %  sodium chloride infusion ( Intravenous New Bag/Given 03/23/21 0447)  diphenhydrAMINE (BENADRYL) injection 25 mg (25 mg Intravenous Given 03/23/21 0444)  famotidine (PEPCID) IVPB 20 mg premix (0 mg Intravenous Stopped 03/23/21 0530)  dexamethasone (DECADRON) injection 10 mg (10 mg Intravenous Given 03/23/21 0446)    ED Course  I have reviewed the triage vital signs and the nursing notes.  Pertinent labs & imaging results that were available during my care of the patient were reviewed by me and considered in my medical decision making (see chart for details).    MDM Rules/Calculators/A&P                          Patient presents to the emergency department  with acute onset of itchy rash consistent with urticaria.  Patient did have IV contrast earlier in the day.  Suspect that this is a delayed reaction to the IV contrast as he has not had any other contacts that would cause allergic reaction.  Patient administered IV Benadryl, IV Pepcid and IV Decadron.  He has been monitored for a couple of hours here and rash is fading, no progression.  Patient stable for discharge,  will have him take Benadryl on a scheduled dose for at least 1 day.  Patient reports a history of significant bee venom allergy as well.  He has not suffered any recent bee stings but it was discovered that he does not have an EpiPen and therefore a prescription was sent to his pharmacy.  Final Clinical Impression(s) / ED Diagnoses Final diagnoses:  Allergic reaction, initial encounter    Rx / DC Orders ED Discharge Orders         Ordered    EPINEPHrine 0.3 mg/0.3 mL IJ SOAJ injection  As needed        03/23/21 0624           Orpah Greek, MD 03/23/21 [phone]

## 2021-03-27 ENCOUNTER — Encounter (HOSPITAL_COMMUNITY): Payer: Self-pay | Admitting: Emergency Medicine

## 2021-03-27 ENCOUNTER — Emergency Department (HOSPITAL_COMMUNITY)
Admission: EM | Admit: 2021-03-27 | Discharge: 2021-03-28 | Disposition: A | Discharge status: Home / Self Care | Payer: Non-veteran care | Attending: Emergency Medicine | Admitting: Emergency Medicine

## 2021-03-27 ENCOUNTER — Other Ambulatory Visit: Payer: Self-pay

## 2021-03-27 DIAGNOSIS — I491 Atrial premature depolarization: Secondary | ICD-10-CM | POA: Insufficient documentation

## 2021-03-27 DIAGNOSIS — I2510 Atherosclerotic heart disease of native coronary artery without angina pectoris: Secondary | ICD-10-CM | POA: Diagnosis not present

## 2021-03-27 DIAGNOSIS — Z79899 Other long term (current) drug therapy: Secondary | ICD-10-CM | POA: Diagnosis not present

## 2021-03-27 DIAGNOSIS — R002 Palpitations: Secondary | ICD-10-CM

## 2021-03-27 DIAGNOSIS — Z96641 Presence of right artificial hip joint: Secondary | ICD-10-CM | POA: Insufficient documentation

## 2021-03-27 DIAGNOSIS — I252 Old myocardial infarction: Secondary | ICD-10-CM | POA: Insufficient documentation

## 2021-03-27 DIAGNOSIS — E876 Hypokalemia: Secondary | ICD-10-CM | POA: Diagnosis not present

## 2021-03-27 DIAGNOSIS — Z87891 Personal history of nicotine dependence: Secondary | ICD-10-CM | POA: Insufficient documentation

## 2021-03-27 DIAGNOSIS — I10 Essential (primary) hypertension: Secondary | ICD-10-CM | POA: Insufficient documentation

## 2021-03-27 DIAGNOSIS — Z7982 Long term (current) use of aspirin: Secondary | ICD-10-CM | POA: Diagnosis not present

## 2021-03-27 DIAGNOSIS — I493 Ventricular premature depolarization: Secondary | ICD-10-CM | POA: Diagnosis not present

## 2021-03-27 LAB — CBC WITH DIFFERENTIAL/PLATELET
Abs Immature Granulocytes: 0.08 10*3/uL — ABNORMAL HIGH (ref 0.00–0.07)
Basophils Absolute: 0 10*3/uL (ref 0.0–0.1)
Basophils Relative: 0 %
Eosinophils Absolute: 1.3 10*3/uL — ABNORMAL HIGH (ref 0.0–0.5)
Eosinophils Relative: 12 %
HCT: 42.4 % (ref 39.0–52.0)
Hemoglobin: 14.1 g/dL (ref 13.0–17.0)
Immature Granulocytes: 1 %
Lymphocytes Relative: 33 %
Lymphs Abs: 3.7 10*3/uL (ref 0.7–4.0)
MCH: 30.3 pg (ref 26.0–34.0)
MCHC: 33.3 g/dL (ref 30.0–36.0)
MCV: 91 fL (ref 80.0–100.0)
Monocytes Absolute: 0.8 10*3/uL (ref 0.1–1.0)
Monocytes Relative: 7 %
Neutro Abs: 5.2 10*3/uL (ref 1.7–7.7)
Neutrophils Relative %: 47 %
Platelets: 266 10*3/uL (ref 150–400)
RBC: 4.66 MIL/uL (ref 4.22–5.81)
RDW: 13.4 % (ref 11.5–15.5)
WBC: 11 10*3/uL — ABNORMAL HIGH (ref 4.0–10.5)
nRBC: 0 % (ref 0.0–0.2)

## 2021-03-27 NOTE — ED Provider Notes (Addendum)
[facility] Provider Note   CSN: [id number] Arrival date & time: 03/27/21  2306     History Chief Complaint  Patient presents with  . Palpitations    [name] [name] is a 70 y.o. male.  Patient presents with complaints of intermittent palpitations throughout the day today.  He has been experiencing fluttering in his chest that has been occurring sporadically.  No associated chest pain.  Patient was recently seen in the emergency department with hives and was prescribed prednisone.  He only took 1 day of the prednisone, is not currently taking it.  He has stopped some of his other medications recently clearing his Pravachol and Prilosec.        Past Medical History:  Diagnosis Date  . Allergy   . Anxiety   . Arthritis    back pain  . Avascular necrosis of bone of hip ([place])    right  . CAD (coronary artery disease)    stents x3 2008  . GERD (gastroesophageal reflux disease)   . History of nuclear stress test    a. Myoview 10/17: EF 56%, diaphragmatic attenuation, no ischemia, low risk  . HTN (hypertension)   . Hyperlipidemia   . Myocardial infarction ([place])    2008- stents x 3   . Paresthesias   . Sleep apnea    no cpap   . Thyroid disease     Patient Active Problem List   Diagnosis Date Noted  . Abnormality of esophagus 01/27/2020  . Colon cancer screening 01/27/2020  . Esophageal dysphagia 02/08/2016  . Hemorrhoids 02/08/2016  . Chronic radicular cervical pain 07/29/2014  . GERD (gastroesophageal reflux disease) 10/14/2012  . Constipation 10/14/2012  . Stiffness of joint, not elsewhere classified, other specified site 02/21/2012  . PUD (peptic ulcer disease) 12/21/2011  . Dysphagia 07/25/2011  . Hyperlipidemia 08/12/2009  . ESSENTIAL HYPERTENSION, BENIGN 08/12/2009  . CAD, NATIVE VESSEL 08/12/2009  . CUTANEOUS ERUPTIONS, DRUG-INDUCED 01/09/2008  . LOCALIZED SUPERFICIAL SWELLING MASS OR LUMP 01/09/2008    Past Surgical History:   Procedure Laterality Date  . CARDIAC CATHETERIZATION     3 stents placed  . COLONOSCOPY  2011 NUR [facility] SCREENING d50 v5   MILD [place] TICS, ? PREP ARTIFACT V. PROCTITIS-rX:[place]. next TCS 2021.  . ESOPHAGEAL DILATION N/A 03/02/2016   Procedure: ESOPHAGEAL DILATION;  Surgeon: Danie Binder, MD;  [facility];  Service: Endoscopy;  Laterality: N/A;  . ESOPHAGOGASTRODUODENOSCOPY  10/03/11   small hiatal hernia/gastric ulcers/stricutre in distal esophagus  . ESOPHAGOGASTRODUODENOSCOPY N/A 03/02/2016   Dr. Oneida Alar: Schatzki's ring at [place] junction s/p dilation, mild non-erosive gastritis/duodenitis   . ESOPHAGOGASTRODUODENOSCOPY (EGD) WITH PROPOFOL N/A 02/09/2020   Dr. Oneida Alar: Low-grade narrowing Schatzki ring status post dilation, moderate gastritis, no biopsies taken  . MASS EXCISION Left 09/30/2014   Procedure: EXCISION MASS LEFT WRIST ;  Surgeon: Daryll Brod, MD;  Location: [place];  Service: Orthopedics;  Laterality: Left;  . ROTATOR CUFF REPAIR Left    [facility]   . SAVORY DILATION N/A 02/09/2020   Procedure: SAVORY DILATION;  Surgeon: Danie Binder, MD;  [facility];  Service: Endoscopy;  Laterality: N/A;  . TONSILLECTOMY  age 37  . TOTAL HIP ARTHROPLASTY  age 54   right hip for avascular necrosis of hip and spine  . TRIGGER FINGER RELEASE Left 03/08/2020   Procedure: RELEASE TRIGGER FINGER/A-1 PULLEY LEFT INDEX FINGER;  Surgeon: Daryll Brod, MD;  Location: [place];  Service: Orthopedics;  Laterality: Left;  IV REGIONAL FOREARM BLOCK  . UPPER GASTROINTESTINAL ENDOSCOPY     with dilation - last 02-2020        Family History  Problem Relation Age of Onset  . Dementia Mother   . Prostate cancer Father 22  . Lung cancer Sister 62  . Colon cancer Neg Hx   . Colon polyps Neg Hx   . Anesthesia problems Neg Hx   . Hypotension Neg Hx   . Malignant hyperthermia Neg Hx   . Pseudochol deficiency Neg Hx   . Esophageal cancer Neg Hx   . Stomach  cancer Neg Hx   . Rectal cancer Neg Hx     Social History   Tobacco Use  . Smoking status: Former Smoker    Packs/day: 1.00    Years: 30.00    Pack years: 30.00    Types: Cigarettes    Quit date: 09/30/2007    Years since quitting: 13.4  . Smokeless tobacco: Never Used  Vaping Use  . Vaping Use: Never used  Substance Use Topics  . Alcohol use: No  . Drug use: No    Home Medications Prior to Admission medications   Medication Sig Start Date End Date Taking? Authorizing Provider  acetaminophen (TYLENOL) 500 MG tablet Take 1,000 mg by mouth as needed for moderate pain.     [provider]  Alirocumab (PRALUENT) 75 MG/ML SOAJ Inject 1 pen into the skin every 14 (fourteen) days. 01/02/21   Fay Records, MD  aspirin EC 81 MG tablet Take 1 tablet (81 mg total) by mouth daily. 01/24/18   Fay Records, MD  calcium carbonate (TUMS EX) 750 MG chewable tablet Chew 1 tablet by mouth 2 (two) times daily as needed for heartburn.     [provider]  carvedilol (COREG) 3.125 MG tablet Take 1 tablet (3.125 mg total) by mouth 2 (two) times daily with a meal. 09/14/19   Bhagat, Bhavinkumar, PA  EPINEPHrine 0.3 mg/0.3 mL IJ SOAJ injection Inject 0.3 mg into the muscle as needed for anaphylaxis. 03/23/21   Orpah Greek, MD  ezetimibe (ZETIA) 10 MG tablet Take 5 mg by mouth daily.    [provider]  famotidine (PEPCID) 20 MG tablet Take 1 tablet (20 mg total) by mouth 2 (two) times daily. 06/04/16   Annitta Needs, NP  hydrochlorothiazide (HYDRODIURIL) 25 MG tablet Take 12.5 mg by mouth daily. 07/01/19   [provider]  ibuprofen (ADVIL) 200 MG tablet Take 200 mg by mouth every 6 (six) hours as needed. Takes 2 tablets as needed.    [provider]  ketotifen (ZADITOR) 0.025 % ophthalmic solution Place 1 drop into both eyes daily as needed (itchy eyes).    [provider]  levothyroxine (SYNTHROID, LEVOTHROID) 75 MCG tablet Take 75 mcg by mouth  daily before breakfast.    [provider]  losartan (COZAAR) 100 MG tablet Take 50 mg by mouth 2 (two) times daily. 07/09/19   [provider]  pantoprazole (PROTONIX) 40 MG tablet Take 40 mg by mouth in the morning and at bedtime.  03/27/19   [provider]  pravastatin (PRAVACHOL) 40 MG tablet Take 20 mg by mouth at bedtime.    [provider]  zolpidem (AMBIEN) 10 MG tablet Take 10 mg by mouth at bedtime as needed. 05/26/20   [provider]    Allergies    Atorvastatin, Bee venom, Clopidogrel bisulfate, Pravastatin, Rosuvastatin,  Penicillins, Contrast media [iodinated diagnostic agents], Adhesive [tape], Clopidogrel, Omeprazole, and Repatha [evolocumab]  Review of Systems   Review of Systems  Cardiovascular: Positive for palpitations.  All other systems reviewed and are negative.   Physical Exam Updated Vital Signs BP (!) 150/111   Pulse 93   Resp 19   Ht 6' (1.829 m)   Wt 80.7 kg   SpO2 100%   BMI 24.14 kg/m   Physical Exam Vitals and nursing note reviewed.  Constitutional:      General: He is not in acute distress.    Appearance: Normal appearance. He is well-developed.  HENT:     Head: Normocephalic and atraumatic.     Right Ear: Hearing normal.     Left Ear: Hearing normal.     Nose: Nose normal.  Eyes:     Conjunctiva/sclera: Conjunctivae normal.     Pupils: Pupils are equal, round, and reactive to light.  Cardiovascular:     Rate and Rhythm: Regular rhythm. Frequent extrasystoles are present.    Heart sounds: S1 normal and S2 normal. No murmur heard. No friction rub. No gallop.   Pulmonary:     Effort: Pulmonary effort is normal. No respiratory distress.     Breath sounds: Normal breath sounds.  Chest:     Chest wall: No tenderness.  Abdominal:     General: Bowel sounds are normal.     Palpations: Abdomen is soft.     Tenderness: There is no abdominal tenderness. There is no guarding or rebound. Negative signs  include Murphy's sign and McBurney's sign.     Hernia: No hernia is present.  Musculoskeletal:        General: Normal range of motion.     Cervical back: Normal range of motion and neck supple.  Skin:    General: Skin is warm and dry.     Findings: No rash.  Neurological:     Mental Status: He is alert and oriented to person, place, and time.     GCS: GCS eye subscore is 4. GCS verbal subscore is 5. GCS motor subscore is 6.     Cranial Nerves: No cranial nerve deficit.     Sensory: No sensory deficit.     Coordination: Coordination normal.  Psychiatric:        Speech: Speech normal.        Behavior: Behavior normal.        Thought Content: Thought content normal.     ED Results / Procedures / Treatments   Labs (all labs ordered are listed, but only abnormal results are displayed) Labs Reviewed  CBC WITH DIFFERENTIAL/PLATELET  BASIC METABOLIC PANEL  MAGNESIUM  TROPONIN I (HIGH SENSITIVITY)    EKG None  Radiology No results found.  Procedures Procedures   Medications Ordered in ED Medications - No data to display  ED Course  I have reviewed the triage vital signs and the nursing notes.  Pertinent labs & imaging results that were available during my care of the patient were reviewed by me and considered in my medical decision making (see chart for details).    MDM Rules/Calculators/A&P                          Patient presents to the emergency department for evaluation of heart palpitations.  Patient noted to have frequent PACs and some PVCs on the monitor.  This ectopy does coincide with his palpitation sensation.  No associated chest  pain.  EKG without any concerning findings.  No other arrhythmias noted during monitoring while in the department.  Labs unremarkable other than slight hypokalemia.  Will put on supplemental potassium.  Increase Coreg dose as needed for symptoms, follow-up with cardiology.  Final Clinical Impression(s) / ED Diagnoses Final diagnoses:   Heart palpitations  PVC (premature ventricular contraction)  PAC (premature atrial contraction)    Rx / DC Orders ED Discharge Orders    None       Shaaron Golliday, Gwenyth Allegra, MD 03/28/21 0025    Orpah Greek, MD 03/28/21 [phone]

## 2021-03-27 NOTE — ED Triage Notes (Signed)
Pt c/o heart palpitations intermittently today.

## 2021-03-28 DIAGNOSIS — E663 Overweight: Secondary | ICD-10-CM | POA: Diagnosis not present

## 2021-03-28 DIAGNOSIS — Z888 Allergy status to other drugs, medicaments and biological substances status: Secondary | ICD-10-CM | POA: Diagnosis not present

## 2021-03-28 DIAGNOSIS — T7840XA Allergy, unspecified, initial encounter: Secondary | ICD-10-CM | POA: Diagnosis not present

## 2021-03-28 DIAGNOSIS — Z6825 Body mass index (BMI) 25.0-25.9, adult: Secondary | ICD-10-CM | POA: Diagnosis not present

## 2021-03-28 LAB — MAGNESIUM: Magnesium: 2 mg/dL (ref 1.7–2.4)

## 2021-03-28 LAB — BASIC METABOLIC PANEL
Anion gap: 10 (ref 5–15)
BUN: 16 mg/dL (ref 8–23)
CO2: 24 mmol/L (ref 22–32)
Calcium: 8.9 mg/dL (ref 8.9–10.3)
Chloride: 97 mmol/L — ABNORMAL LOW (ref 98–111)
Creatinine, Ser: 0.93 mg/dL (ref 0.61–1.24)
GFR, Estimated: >60 mL/min (ref >60)
Glucose, Bld: 155 mg/dL — ABNORMAL HIGH (ref 70–99)
Potassium: 3.3 mmol/L — ABNORMAL LOW (ref 3.5–5.1)
Sodium: 131 mmol/L — ABNORMAL LOW (ref 135–145)

## 2021-03-28 LAB — TROPONIN I (HIGH SENSITIVITY): Troponin I (High Sensitivity): 5 ng/L (ref <18)

## 2021-03-28 MED ORDER — POTASSIUM CHLORIDE CRYS ER 20 MEQ PO TBCR: 20.0000 meq | EXTENDED_RELEASE_TABLET | Freq: Every day | ORAL | 0 refills | Status: DC

## 2021-03-28 MED ORDER — POTASSIUM CHLORIDE CRYS ER 20 MEQ PO TBCR
40.0000 meq | EXTENDED_RELEASE_TABLET | Freq: Once | ORAL | Status: AC
  Administered 2021-03-28: 40 meq via ORAL
  Filled 2021-03-28: qty 2

## 2021-03-28 MED ORDER — METOPROLOL TARTRATE 25 MG PO TABS: 25.0000 mg | ORAL_TABLET | Freq: Two times a day (BID) | ORAL | 0 refills | Status: DC | PRN

## 2021-03-28 NOTE — Discharge Instructions (Addendum)
You may take a whole Carvedilol tablet in the morning if you are having palpitations. Do this until you see your cardiologist.

## 2021-03-29 DIAGNOSIS — E7849 Other hyperlipidemia: Secondary | ICD-10-CM | POA: Diagnosis not present

## 2021-03-29 DIAGNOSIS — I10 Essential (primary) hypertension: Secondary | ICD-10-CM | POA: Diagnosis not present

## 2021-03-29 DIAGNOSIS — E039 Hypothyroidism, unspecified: Secondary | ICD-10-CM | POA: Diagnosis not present

## 2021-05-17 DIAGNOSIS — E663 Overweight: Secondary | ICD-10-CM | POA: Diagnosis not present

## 2021-05-17 DIAGNOSIS — E039 Hypothyroidism, unspecified: Secondary | ICD-10-CM | POA: Diagnosis not present

## 2021-05-17 DIAGNOSIS — Z6825 Body mass index (BMI) 25.0-25.9, adult: Secondary | ICD-10-CM | POA: Diagnosis not present

## 2021-05-30 DIAGNOSIS — E039 Hypothyroidism, unspecified: Secondary | ICD-10-CM | POA: Diagnosis not present

## 2021-05-30 DIAGNOSIS — I10 Essential (primary) hypertension: Secondary | ICD-10-CM | POA: Diagnosis not present

## 2021-05-30 DIAGNOSIS — E7849 Other hyperlipidemia: Secondary | ICD-10-CM | POA: Diagnosis not present

## 2021-06-29 DIAGNOSIS — E039 Hypothyroidism, unspecified: Secondary | ICD-10-CM | POA: Diagnosis not present

## 2021-06-29 DIAGNOSIS — E782 Mixed hyperlipidemia: Secondary | ICD-10-CM | POA: Diagnosis not present

## 2021-06-29 DIAGNOSIS — I10 Essential (primary) hypertension: Secondary | ICD-10-CM | POA: Diagnosis not present

## 2021-07-12 DIAGNOSIS — S82034A Nondisplaced transverse fracture of right patella, initial encounter for closed fracture: Secondary | ICD-10-CM | POA: Diagnosis not present

## 2021-07-12 DIAGNOSIS — M25561 Pain in right knee: Secondary | ICD-10-CM | POA: Diagnosis not present

## 2021-12-12 DIAGNOSIS — F419 Anxiety disorder, unspecified: Secondary | ICD-10-CM | POA: Diagnosis not present

## 2021-12-12 DIAGNOSIS — E039 Hypothyroidism, unspecified: Secondary | ICD-10-CM | POA: Diagnosis not present

## 2021-12-12 DIAGNOSIS — E782 Mixed hyperlipidemia: Secondary | ICD-10-CM | POA: Diagnosis not present

## 2021-12-12 DIAGNOSIS — H6123 Impacted cerumen, bilateral: Secondary | ICD-10-CM | POA: Diagnosis not present

## 2021-12-12 DIAGNOSIS — E663 Overweight: Secondary | ICD-10-CM | POA: Diagnosis not present

## 2021-12-12 DIAGNOSIS — L500 Allergic urticaria: Secondary | ICD-10-CM | POA: Diagnosis not present

## 2021-12-12 DIAGNOSIS — H6120 Impacted cerumen, unspecified ear: Secondary | ICD-10-CM | POA: Diagnosis not present

## 2021-12-12 DIAGNOSIS — I10 Essential (primary) hypertension: Secondary | ICD-10-CM | POA: Diagnosis not present

## 2021-12-12 DIAGNOSIS — I2510 Atherosclerotic heart disease of native coronary artery without angina pectoris: Secondary | ICD-10-CM | POA: Diagnosis not present

## 2021-12-12 DIAGNOSIS — Z6824 Body mass index (BMI) 24.0-24.9, adult: Secondary | ICD-10-CM | POA: Diagnosis not present

## 2021-12-12 DIAGNOSIS — R5382 Chronic fatigue, unspecified: Secondary | ICD-10-CM | POA: Diagnosis not present

## 2021-12-12 DIAGNOSIS — H6121 Impacted cerumen, right ear: Secondary | ICD-10-CM | POA: Diagnosis not present

## 2021-12-29 DIAGNOSIS — E782 Mixed hyperlipidemia: Secondary | ICD-10-CM | POA: Diagnosis not present

## 2021-12-29 DIAGNOSIS — E039 Hypothyroidism, unspecified: Secondary | ICD-10-CM | POA: Diagnosis not present

## 2021-12-29 DIAGNOSIS — I10 Essential (primary) hypertension: Secondary | ICD-10-CM | POA: Diagnosis not present

## 2022-01-02 NOTE — Progress Notes (Signed)
Cardiology Office Note   Date:  01/04/2022   ID:  [name] [name] [id number], MRN [id number]  PCP:  Sharilyn Sites, MD  Cardiologist:   Dorris Carnes, MD   F/U CAD      History of Present Illness: [name] [name] is a 71 y.o. male with a history ofCAD, HTN, PV disease, and HLD seen for numbness and tingling.    History of CAD s/p NSTEMI in 2008 tx with PCI of bifurcational lesion in LAD/Dx with 3 DES. He has a hx of rash with Plavix and Ticlid.  He underwent Plavix desensitization in 2009. Intolerance to Statin and Repatha (caused nightmares). Low risk stress test 09/2016.   The pt had limited episode of exertional CP in AUg/2020.  SHort lived  F/U stress test low risk  No ischemia   I saw the pt in clinic in Jan 2022  Since seen he has done well   He notes occas L sided CP   Lateral chest  Improves when stretches.     Denies SOB    No other CP    Pt walks dog, piddles around house  Breakfast:  Oatmeal, blueberries or banana Lunch   [employer] or [employer]  Meat/vegg  Patient says 1/2 gallon sweet tea will last him 4 to 5 days  Current Meds  Medication Sig   acetaminophen (TYLENOL) 325 MG tablet Take 650 mg by mouth daily as needed (pain).   aspirin EC 81 MG tablet Take 1 tablet (81 mg total) by mouth daily.   calcium carbonate (TUMS EX) 750 MG chewable tablet Chew 1 tablet by mouth 2 (two) times daily as needed for heartburn.    carvedilol (COREG) 6.25 MG tablet Take 0.5 tablets by mouth in the morning and at bedtime.   EPINEPHrine 0.3 mg/0.3 mL IJ SOAJ injection Inject 0.3 mg into the muscle as needed for anaphylaxis.   ezetimibe (ZETIA) 10 MG tablet Take 5 mg by mouth daily.   famotidine (PEPCID) 20 MG tablet Take 1 tablet (20 mg total) by mouth 2 (two) times daily.   hydrochlorothiazide (HYDRODIURIL) 25 MG tablet Take 12.5 mg by mouth daily.   ketotifen (ZADITOR) 0.025 % ophthalmic solution Place 1 drop into both eyes 2 (two) times daily.    levothyroxine (SYNTHROID, LEVOTHROID) 75 MCG tablet Take 75 mcg by mouth daily before breakfast.   losartan (COZAAR) 100 MG tablet Take 50 mg by mouth 2 (two) times daily.   methocarbamol (ROBAXIN) 750 MG tablet Take 1 tablet by mouth 2 (two) times daily as needed. As directed   pravastatin (PRAVACHOL) 40 MG tablet Take 40 mg by mouth at bedtime.   tadalafil (CIALIS) 20 MG tablet TAKE ONE TABLET BY MOUTH AS INSTRUCTED AS NEEDED (TAKE 1 HOUR PRIOR TO SEXUAL ACTIVITY *DO NOT EXCEED 1 DOSE PER 24   triamcinolone cream (KENALOG) 0.1 % Apply 1 application topically 2 (two) times daily as needed (as directed).     Allergies:   Atorvastatin, Bee venom, Clopidogrel bisulfate, Rosuvastatin, Penicillins, Contrast media [iodinated contrast media], Flomax [tamsulosin], Gadolinium, Neomycin, Viagra [sildenafil], Clopidogrel, Omeprazole, Repatha [evolocumab], and Tape   Past Medical History:  Diagnosis Date   Allergy    Anxiety    Arthritis    back pain   Avascular necrosis of bone of hip ([place])    right   CAD (coronary artery disease)    stents x3 2008   GERD (gastroesophageal reflux disease)    History of  nuclear stress test    a. Myoview 10/17: EF 56%, diaphragmatic attenuation, no ischemia, low risk   HTN (hypertension)    Hyperlipidemia    Myocardial infarction ([place])    2008- stents x 3    Paresthesias    Sleep apnea    no cpap    Thyroid disease     Past Surgical History:  Procedure Laterality Date   CARDIAC CATHETERIZATION     3 stents placed   COLONOSCOPY  2011 NUR [facility] SCREENING d50 v5   MILD [place] TICS, ? PREP ARTIFACT V. PROCTITIS-rX:[place]. next TCS 2021.   ESOPHAGEAL DILATION N/A 03/02/2016   Procedure: ESOPHAGEAL DILATION;  Surgeon: Danie Binder, MD;  [facility];  Service: Endoscopy;  Laterality: N/A;   ESOPHAGOGASTRODUODENOSCOPY  10/03/11   small hiatal hernia/gastric ulcers/stricutre in distal esophagus   ESOPHAGOGASTRODUODENOSCOPY N/A 03/02/2016   Dr. Oneida Alar:  Schatzki's ring at [place] junction s/p dilation, mild non-erosive gastritis/duodenitis    ESOPHAGOGASTRODUODENOSCOPY (EGD) WITH PROPOFOL N/A 02/09/2020   Dr. Oneida Alar: Low-grade narrowing Schatzki ring status post dilation, moderate gastritis, no biopsies taken   MASS EXCISION Left 09/30/2014   Procedure: EXCISION MASS LEFT WRIST ;  Surgeon: Daryll Brod, MD;  Location: [place];  Service: Orthopedics;  Laterality: Left;   ROTATOR CUFF REPAIR Left    [facility]    SAVORY DILATION N/A 02/09/2020   Procedure: SAVORY DILATION;  Surgeon: Danie Binder, MD;  [facility];  Service: Endoscopy;  Laterality: N/A;   TONSILLECTOMY  age 52   TOTAL HIP ARTHROPLASTY  age 36   right hip for avascular necrosis of hip and spine   TRIGGER FINGER RELEASE Left 03/08/2020   Procedure: RELEASE TRIGGER FINGER/A-1 PULLEY LEFT INDEX FINGER;  Surgeon: Daryll Brod, MD;  Location: [place];  Service: Orthopedics;  Laterality: Left;  IV REGIONAL FOREARM BLOCK   UPPER GASTROINTESTINAL ENDOSCOPY     with dilation - last 02-2020      Social History:  The patient  reports that he quit smoking about 14 years ago. His smoking use included cigarettes. He has a 30.00 pack-year smoking history. He has never used smokeless tobacco. He reports that he does not drink alcohol and does not use drugs.   Family History:  The patient's family history includes Dementia in his mother; Lung cancer (age of onset: 57) in his sister; Prostate cancer (age of onset: 38) in his father.    ROS:  Please see the history of present illness. All other systems are reviewed and  Negative to the above problem except as noted.    PHYSICAL EXAM: VS:  BP 124/76    Pulse 68    Ht 6' (1.829 m)    Wt 181 lb 6.4 oz (82.3 kg)    SpO2 96%    BMI 24.60 kg/m   GEN: Well nourished, well developed, in no acute distress  HEENT: normal  Neck: no JVD, carotid bruits,   Cardiac: RRR; no murmurs  NO LE  edema  Respiratory:  clear  to auscultation bilaterally,   GI: soft, nontender, nondistended, + BS  No hepatomegaly  MS: no deformity Moving all extremities   Skin: warm and dry, no rash   Neuro:  Strength and sensation are intact Psych: euthymic mood, full affect   EKG:  EKG is not ordered today.    Lipid Panel    Component Value Date/Time   CHOL 142 05/23/2020 1117   TRIG 79 05/23/2020  1117   TRIG 107 08/15/2009 1335   HDL 35 (L) 05/23/2020 1117   CHOLHDL 4.1 05/23/2020 1117   CHOLHDL 4.5 01/30/2016 1005   VLDL 24 01/30/2016 1005   LDLCALC 92 05/23/2020 1117   LDLCALC 87 08/15/2009 1335      Wt Readings from Last 3 Encounters:  01/04/22 181 lb 6.4 oz (82.3 kg)  03/27/21 178 lb (80.7 kg)  03/23/21 181 lb 7 oz (82.3 kg)      ASSESSMENT AND PLAN:  1  CAD  Pt with remote intervention  No sympotms of angina   The L lateral pain he has appear more musculoskeletal  Follow   2  HL   Pt has had problems with meds    Intolerant to Praluent    On pravstatin and Zetia    Will check labs  3  HTN  BP is controlled .  Continue meds  4  Diet   Cut out sugars    Time restricted eating     Keep active    F/U in November    Current medicines are reviewed at length with the patient today.  The patient does not have concerns regarding medicines.  Signed, Dorris Carnes, MD  01/04/2022 9:54 AM    [address], [place], [place]  [address] Phone: [phone]; Fax: [phone]

## 2022-01-03 ENCOUNTER — Other Ambulatory Visit: Payer: Self-pay

## 2022-01-04 ENCOUNTER — Encounter: Payer: Self-pay | Admitting: Internal Medicine

## 2022-01-04 ENCOUNTER — Other Ambulatory Visit: Payer: Self-pay

## 2022-01-04 ENCOUNTER — Other Ambulatory Visit: Payer: Medicare Other

## 2022-01-04 ENCOUNTER — Ambulatory Visit (INDEPENDENT_AMBULATORY_CARE_PROVIDER_SITE_OTHER): Payer: Medicare Other | Admitting: Internal Medicine

## 2022-01-04 DIAGNOSIS — I2510 Atherosclerotic heart disease of native coronary artery without angina pectoris: Secondary | ICD-10-CM | POA: Diagnosis not present

## 2022-01-04 LAB — LIPID PANEL
Chol/HDL Ratio: 4.1 ratio (ref 0.0–5.0)
Cholesterol, Total: 144 mg/dL (ref 100–199)
HDL: 35 mg/dL — ABNORMAL LOW (ref >39)
LDL Chol Calc (NIH): 93 mg/dL (ref 0–99)
Triglycerides: 82 mg/dL (ref 0–149)
VLDL Cholesterol Cal: 16 mg/dL (ref 5–40)

## 2022-01-04 NOTE — Patient Instructions (Signed)
Medication Instructions:  Your physician recommends that you continue on your current medications as directed. Please refer to the Current Medication list given to you today.  *If you need a refill on your cardiac medications before your next appointment, please call your pharmacy*   Lab Work: none If you have labs (blood work) drawn today and your tests are completely normal, you will receive your results only by: MyChart Message (if you have MyChart) OR A paper copy in the mail If you have any lab test that is abnormal or we need to change your treatment, we will call you to review the results.   Testing/Procedures: none   Follow-Up: At [facility], you and your health needs are our priority.  As part of our continuing mission to provide you with exceptional heart care, we have created designated Provider Care Teams.  These Care Teams include your primary Cardiologist (physician) and Advanced Practice Providers (APPs -  Physician Assistants and Nurse Practitioners) who all work together to provide you with the care you need, when you need it.  We recommend signing up for the patient portal called "MyChart".  Sign up information is provided on this After Visit Summary.  MyChart is used to connect with patients for Virtual Visits (Telemedicine).  Patients are able to view lab/test results, encounter notes, upcoming appointments, etc.  Non-urgent messages can be sent to your provider as well.   To learn more about what you can do with MyChart, go to [email or web address].    Your next appointment:   1 year(s)  The format for your next appointment:   In Person  Provider:   Paula Ross, MD     Other Instructions   

## 2022-02-19 DIAGNOSIS — M79671 Pain in right foot: Secondary | ICD-10-CM | POA: Diagnosis not present

## 2022-02-19 DIAGNOSIS — M25579 Pain in unspecified ankle and joints of unspecified foot: Secondary | ICD-10-CM | POA: Diagnosis not present

## 2022-02-19 DIAGNOSIS — M1990 Unspecified osteoarthritis, unspecified site: Secondary | ICD-10-CM | POA: Diagnosis not present

## 2022-02-27 ENCOUNTER — Encounter: Payer: Self-pay | Admitting: Podiatry

## 2022-02-27 ENCOUNTER — Other Ambulatory Visit: Payer: Self-pay

## 2022-02-27 ENCOUNTER — Ambulatory Visit (INDEPENDENT_AMBULATORY_CARE_PROVIDER_SITE_OTHER): Payer: Medicare Other

## 2022-02-27 ENCOUNTER — Ambulatory Visit (INDEPENDENT_AMBULATORY_CARE_PROVIDER_SITE_OTHER): Payer: Medicare Other | Admitting: Podiatry

## 2022-02-27 DIAGNOSIS — M549 Dorsalgia, unspecified: Secondary | ICD-10-CM | POA: Insufficient documentation

## 2022-02-27 DIAGNOSIS — I2510 Atherosclerotic heart disease of native coronary artery without angina pectoris: Secondary | ICD-10-CM

## 2022-02-27 DIAGNOSIS — H35033 Hypertensive retinopathy, bilateral: Secondary | ICD-10-CM | POA: Insufficient documentation

## 2022-02-27 DIAGNOSIS — H90A32 Mixed conductive and sensorineural hearing loss, unilateral, left ear with restricted hearing on the contralateral side: Secondary | ICD-10-CM | POA: Insufficient documentation

## 2022-02-27 DIAGNOSIS — M2041 Other hammer toe(s) (acquired), right foot: Secondary | ICD-10-CM

## 2022-02-27 DIAGNOSIS — H6123 Impacted cerumen, bilateral: Secondary | ICD-10-CM | POA: Insufficient documentation

## 2022-02-27 DIAGNOSIS — M4722 Other spondylosis with radiculopathy, cervical region: Secondary | ICD-10-CM | POA: Insufficient documentation

## 2022-02-27 DIAGNOSIS — J310 Chronic rhinitis: Secondary | ICD-10-CM | POA: Insufficient documentation

## 2022-02-27 DIAGNOSIS — N401 Enlarged prostate with lower urinary tract symptoms: Secondary | ICD-10-CM | POA: Insufficient documentation

## 2022-02-27 DIAGNOSIS — N529 Male erectile dysfunction, unspecified: Secondary | ICD-10-CM | POA: Insufficient documentation

## 2022-02-27 DIAGNOSIS — H269 Unspecified cataract: Secondary | ICD-10-CM | POA: Insufficient documentation

## 2022-02-27 DIAGNOSIS — F411 Generalized anxiety disorder: Secondary | ICD-10-CM | POA: Insufficient documentation

## 2022-02-27 DIAGNOSIS — G4700 Insomnia, unspecified: Secondary | ICD-10-CM | POA: Insufficient documentation

## 2022-02-27 DIAGNOSIS — G4733 Obstructive sleep apnea (adult) (pediatric): Secondary | ICD-10-CM | POA: Insufficient documentation

## 2022-02-27 DIAGNOSIS — F419 Anxiety disorder, unspecified: Secondary | ICD-10-CM | POA: Insufficient documentation

## 2022-02-27 DIAGNOSIS — D1721 Benign lipomatous neoplasm of skin and subcutaneous tissue of right arm: Secondary | ICD-10-CM | POA: Insufficient documentation

## 2022-02-27 DIAGNOSIS — G729 Myopathy, unspecified: Secondary | ICD-10-CM | POA: Insufficient documentation

## 2022-02-27 MED ORDER — NITROGLYCERIN 0.2 MG/HR TD PT24: 0.2000 mg | MEDICATED_PATCH | Freq: Every day | TRANSDERMAL | 12 refills | Status: DC

## 2022-02-27 NOTE — Progress Notes (Signed)
Subjective:  Patient ID: [name] [name], male    DOB: 1951-10-14,  MRN: [id number] HPI Chief Complaint  Patient presents with   Toe Pain    2nd toe right - toenail and around nail has bruised appearance, been sore x 2 months, no injury, Dr. Cleda Mccreedy in [place] xrayed said bone looked normal   New Patient (Initial Visit)    71 y.o. male presents with the above complaint.   ROS: Denies fever chills nausea vomiting muscle aches pains calf pain back pain chest pain shortness of breath.  Past Medical History:  Diagnosis Date   Allergy    Anxiety    Arthritis    back pain   Avascular necrosis of bone of hip ([place])    right   CAD (coronary artery disease)    stents x3 2008   GERD (gastroesophageal reflux disease)    History of nuclear stress test    a. Myoview 10/17: EF 56%, diaphragmatic attenuation, no ischemia, low risk   HTN (hypertension)    Hyperlipidemia    Myocardial infarction ([place])    2008- stents x 3    Paresthesias    Sleep apnea    no cpap    Thyroid disease    Past Surgical History:  Procedure Laterality Date   CARDIAC CATHETERIZATION     3 stents placed   COLONOSCOPY  2011 NUR [facility] SCREENING d50 v5   MILD [place] TICS, ? PREP ARTIFACT V. PROCTITIS-rX:[place]. next TCS 2021.   ESOPHAGEAL DILATION N/A 03/02/2016   Procedure: ESOPHAGEAL DILATION;  Surgeon: Danie Binder, MD;  [facility];  Service: Endoscopy;  Laterality: N/A;   ESOPHAGOGASTRODUODENOSCOPY  10/03/11   small hiatal hernia/gastric ulcers/stricutre in distal esophagus   ESOPHAGOGASTRODUODENOSCOPY N/A 03/02/2016   Dr. Oneida Alar: Schatzki's ring at [place] junction s/p dilation, mild non-erosive gastritis/duodenitis    ESOPHAGOGASTRODUODENOSCOPY (EGD) WITH PROPOFOL N/A 02/09/2020   Dr. Oneida Alar: Low-grade narrowing Schatzki ring status post dilation, moderate gastritis, no biopsies taken   MASS EXCISION Left 09/30/2014   Procedure: EXCISION MASS LEFT WRIST ;  Surgeon: Daryll Brod, MD;  Location: [place];  Service: Orthopedics;  Laterality: Left;   ROTATOR CUFF REPAIR Left    [facility]    SAVORY DILATION N/A 02/09/2020   Procedure: SAVORY DILATION;  Surgeon: Danie Binder, MD;  [facility];  Service: Endoscopy;  Laterality: N/A;   TONSILLECTOMY  age 47   TOTAL HIP ARTHROPLASTY  age 11   right hip for avascular necrosis of hip and spine   TRIGGER FINGER RELEASE Left 03/08/2020   Procedure: RELEASE TRIGGER FINGER/A-1 PULLEY LEFT INDEX FINGER;  Surgeon: Daryll Brod, MD;  Location: [facility];  Service: Orthopedics;  Laterality: Left;  IV REGIONAL FOREARM BLOCK   UPPER GASTROINTESTINAL ENDOSCOPY     with dilation - last 02-2020     Current Outpatient Medications:    nitroGLYCERIN (NITRO-DUR) 0.2 mg/hr patch, Place 1 patch (0.2 mg total) onto the skin daily., Disp: 30 patch, Rfl: 12   acetaminophen (TYLENOL) 325 MG tablet, Take 650 mg by mouth daily as needed (pain)., Disp: , Rfl:    aspirin EC 81 MG tablet, Take 1 tablet (81 mg total) by mouth daily., Disp: 90 tablet, Rfl: 3   calcium carbonate (TUMS EX) 750 MG chewable tablet, Chew 1 tablet by mouth 2 (two) times daily as needed for heartburn. , Disp: , Rfl:    carvedilol (COREG) 6.25 MG tablet, Take 0.5 tablets by  mouth in the morning and at bedtime., Disp: , Rfl:    EPINEPHrine 0.3 mg/0.3 mL IJ SOAJ injection, Inject 0.3 mg into the muscle as needed for anaphylaxis., Disp: 1 each, Rfl: 0   ezetimibe (ZETIA) 10 MG tablet, Take 5 mg by mouth daily., Disp: , Rfl:    famotidine (PEPCID) 20 MG tablet, Take 1 tablet (20 mg total) by mouth 2 (two) times daily., Disp: 180 tablet, Rfl: 3   hydrochlorothiazide (HYDRODIURIL) 25 MG tablet, Take 12.5 mg by mouth daily., Disp: , Rfl:    levothyroxine (SYNTHROID, LEVOTHROID) 75 MCG tablet, Take 75 mcg by mouth daily before breakfast., Disp: , Rfl:    losartan (COZAAR) 100 MG tablet, Take 50 mg by mouth 2 (two) times daily., Disp: , Rfl:    methocarbamol (ROBAXIN) 750 MG  tablet, Take 1 tablet by mouth 2 (two) times daily as needed. As directed, Disp: , Rfl:    pravastatin (PRAVACHOL) 40 MG tablet, Take 40 mg by mouth at bedtime., Disp: , Rfl:    tadalafil (CIALIS) 20 MG tablet, TAKE ONE TABLET BY MOUTH AS INSTRUCTED AS NEEDED (TAKE 1 HOUR PRIOR TO SEXUAL ACTIVITY *DO NOT EXCEED 1 DOSE PER 24, Disp: , Rfl:    triamcinolone cream (KENALOG) 0.1 %, Apply 1 application topically 2 (two) times daily as needed (as directed)., Disp: , Rfl:   Allergies  Allergen Reactions   Atorvastatin Other (See Comments)    Muscle aches   Bee Venom Anaphylaxis, Hives and Swelling   Clopidogrel Bisulfate Hives   Rosuvastatin Other (See Comments)    Muscle aches   Penicillins     Pt unsure what type allergy/ was a child when had reaction Did it involve swelling of the face/tongue/throat, SOB, or low BP? Unknown Did it involve sudden or severe rash/hives, skin peeling, or any reaction on the inside of your mouth or nose? Unknown Did you need to seek medical attention at a hospital or doctor's office? Unknown When did it last happen?       If all above answers are NO, may proceed with cephalosporin use.    Contrast Media [Iodinated Contrast Media] Hives   Flomax [Tamsulosin]    Gadolinium    Neomycin    Viagra [Sildenafil]    Clopidogrel Rash and Hives   Omeprazole Palpitations   Repatha [Evolocumab] Anxiety    Trouble sleeping   Tape Rash    Tears skin Also ,EKG adhesive pads causes redness, rash and burning sensation at site    Review of Systems Objective:  There were no vitals filed for this visit.  General: Well developed, nourished, in no acute distress, alert and oriented x3   Dermatological: Skin is warm, dry and supple bilateral. Nails x 10 are well maintained; remaining integument appears unremarkable at this time. There are no open sores, no preulcerative lesions, no rash or signs of infection present.  Vascular: Dorsalis Pedis artery and Posterior Tibial  artery pedal pulses are 2/4 bilateral with immedate capillary fill time. Pedal hair growth present. No varicosities and no lower extremity edema present bilateral.   Neruologic: Grossly intact via light touch bilateral. Vibratory intact via tuning fork bilateral. Protective threshold with Semmes Wienstein monofilament intact to all pedal sites bilateral. Patellar and Achilles deep tendon reflexes 2+ bilateral. No Babinski or clonus noted bilateral.   Musculoskeletal: No gross boney pedal deformities bilateral. No pain, crepitus, or limitation noted with foot and ankle range of motion bilateral. Muscular strength 5/5 in all groups tested bilateral.  Second toe right foot demonstrates what appears to be vascular embarrassment to the distal aspect of the toe.  He does have a history of peripheral vascular disease and cardiac stent placement.  He also has a history of smoking.  Gait: Unassisted, Nonantalgic.    Radiographs:  Radiographs taken today were taken of the forefoot demonstrate no osseous abnormalities of the second toe.  Assessment & Plan:   Assessment: History of peripheral vascular disease and coronary artery disease.  He also has a history of smoking.  Vascular embarrassment to the distal aspect of the second toe  Plan: At this point we will start him on a 0.2 mg Nitropatch and I am also going to have vascular studies performed.  This just has a vascular appearance to it.     Israel Wunder T. [place], [place]

## 2022-03-09 ENCOUNTER — Encounter: Payer: Self-pay | Admitting: Internal Medicine

## 2022-03-09 ENCOUNTER — Telehealth: Payer: Self-pay | Admitting: Podiatry

## 2022-03-09 DIAGNOSIS — I999 Unspecified disorder of circulatory system: Secondary | ICD-10-CM

## 2022-03-09 DIAGNOSIS — I2510 Atherosclerotic heart disease of native coronary artery without angina pectoris: Secondary | ICD-10-CM

## 2022-03-09 DIAGNOSIS — M2041 Other hammer toe(s) (acquired), right foot: Secondary | ICD-10-CM

## 2022-03-09 DIAGNOSIS — Z79899 Other long term (current) drug therapy: Secondary | ICD-10-CM

## 2022-03-09 DIAGNOSIS — E785 Hyperlipidemia, unspecified: Secondary | ICD-10-CM

## 2022-03-09 NOTE — Telephone Encounter (Signed)
Switched location to [facility] in [place]. They should call patient with appointment. ?

## 2022-03-09 NOTE — Telephone Encounter (Signed)
Pt called and has not heard anything about getting scheduled with the vascular studies. He was told to call if he had not heard anything in a week and it has been over a week.It looks like it was sent to [facility] but pt would rather go to which ever one is best even if in [place]. He said he has not had much luck with offices in [place]. ?

## 2022-03-12 ENCOUNTER — Other Ambulatory Visit: Payer: Self-pay

## 2022-03-12 ENCOUNTER — Ambulatory Visit (HOSPITAL_COMMUNITY)
Admission: RE | Admit: 2022-03-12 | Discharge: 2022-03-12 | Disposition: A | Discharge status: Home / Self Care | Payer: Medicare Other | Source: Ambulatory Visit | Attending: Podiatry | Admitting: Podiatry

## 2022-03-12 DIAGNOSIS — I999 Unspecified disorder of circulatory system: Secondary | ICD-10-CM | POA: Diagnosis not present

## 2022-03-15 ENCOUNTER — Encounter (HOSPITAL_COMMUNITY): Payer: Medicare Other

## 2022-03-28 NOTE — Telephone Encounter (Signed)
As noted by Hughie Closs ?Decrease Zetia to 5   Decrease pravastatin to 20 ?Nexletol 40 mg daily ?F/U lipomed and Lpa in 8 wks with liver function panel ?Patient is approved  ?

## 2022-04-03 ENCOUNTER — Telehealth: Payer: Self-pay | Admitting: Internal Medicine

## 2022-04-03 MED ORDER — EZETIMIBE 10 MG PO TABS: 5.0000 mg | ORAL_TABLET | Freq: Every day | ORAL | Status: AC

## 2022-04-03 MED ORDER — NEXLETOL 180 MG PO TABS: 180.0000 mg | ORAL_TABLET | Freq: Every day | ORAL | 3 refills | Status: DC

## 2022-04-03 MED ORDER — PRAVASTATIN SODIUM 20 MG PO TABS: 20.0000 mg | ORAL_TABLET | Freq: Every evening | ORAL | 3 refills | Status: DC

## 2022-04-03 NOTE — Telephone Encounter (Signed)
Spoke with the pt and he has been given an update med list and lab appt made for 05/16/22.  ? ?Pt given a copay card from online for the [place].  ?

## 2022-04-03 NOTE — Telephone Encounter (Signed)
Pt c/o medication issue: ? ?1. Name of Medication: Bempedoic Acid (NEXLETOL) 180 MG TABS ? ?2. How are you currently taking this medication (dosage and times per day)? Has not started ? ?3. Are you having a reaction (difficulty breathing--STAT)? no ? ?4. What is your medication issue? Patient states the medication cost too much, but he was given a card to get it for $10. He says when he went to the pharmacy they said they best they could do was still over $100. He says he called [employer] an they said the pharmacy did not do it right. He would like to know if someone could help him get the medication for $10.  ?

## 2022-04-04 NOTE — Telephone Encounter (Signed)
**Note De-Identified Roniqua Kintz Obfuscation** The pt states that he has s/w his ins provider about his Nexletol $10 co-pay card as well and was advised that he can use the $10 co-pay card that we provided him with. ? ?He states that they advised him that [facility] ran his co-pay card with the wrong ID# and that is why the discount did not work for him there. ? ?He states that he is going to [facility] today to pick his Nexletol up and will contact us if he continues to have problems with his $10 co-pay card. ? ?He thanked me for calling him back to discuss. ? ?

## 2022-04-04 NOTE — Telephone Encounter (Signed)
Not sure if it was an issue with him filling at a smaller independent pharmacy, he should bring his copay card into [facility]. He has Pharmacist, community and the $10/month copay card should work. He should call us if [facility] has trouble applying the copay card. ?

## 2022-04-04 NOTE — Telephone Encounter (Signed)
**Note De-Identified Caisen Mangas Obfuscation** I attempted a Nexletol PA through covermymeds but received a message  ?AKHIL PISCOPO Key: [id number] ?Drug: Nexletol '180MG'$  tablets ?Form ?[employer] Electronic PA Form [phone] NCPDP) ? ?I called [place] and was advised that the pts prescription was transferred to the [facility] in [place]. ? ?I called [facility] and was advised that the pts ins is paying but that the pts co-pay is $180.13/30 day supply. ?The pharmacist stated that the pt advised them that he has a co-pay card/coupon that will lower his cost to $10/30 day supply that he is bringing with him when he picks his Nexletol up. ?

## 2022-04-05 ENCOUNTER — Other Ambulatory Visit: Payer: Self-pay | Admitting: Internal Medicine

## 2022-04-06 ENCOUNTER — Telehealth: Payer: Self-pay | Admitting: Internal Medicine

## 2022-04-06 NOTE — Telephone Encounter (Signed)
Called pt to inform him that we do not have samples of Bempedoic Acid (Nexletol). I advised the pt that if he has any other problems, questions or concerns, to give our office a call back. Pt verbalize understanding.  ?

## 2022-04-06 NOTE — Telephone Encounter (Signed)
Patient calling the office for samples of medication: ? ? ?1.  What medication and dosage are you requesting samples for? Bempedoic Acid (NEXLETOL) 180 MG TABS ? ?2.  Are you currently out of this medication? no ?  ? ?Patient would like to try some samples before paying for the medication  ?

## 2022-04-19 DIAGNOSIS — J40 Bronchitis, not specified as acute or chronic: Secondary | ICD-10-CM | POA: Diagnosis not present

## 2022-04-19 DIAGNOSIS — Z6824 Body mass index (BMI) 24.0-24.9, adult: Secondary | ICD-10-CM | POA: Diagnosis not present

## 2022-05-16 ENCOUNTER — Other Ambulatory Visit: Payer: Medicare Other

## 2022-05-17 ENCOUNTER — Encounter: Payer: Self-pay | Admitting: Internal Medicine

## 2022-05-24 DIAGNOSIS — R531 Weakness: Secondary | ICD-10-CM | POA: Diagnosis not present

## 2022-07-11 DIAGNOSIS — E039 Hypothyroidism, unspecified: Secondary | ICD-10-CM | POA: Diagnosis not present

## 2022-07-11 DIAGNOSIS — R7301 Impaired fasting glucose: Secondary | ICD-10-CM | POA: Diagnosis not present

## 2022-07-11 DIAGNOSIS — E7849 Other hyperlipidemia: Secondary | ICD-10-CM | POA: Diagnosis not present

## 2022-07-11 DIAGNOSIS — I2510 Atherosclerotic heart disease of native coronary artery without angina pectoris: Secondary | ICD-10-CM | POA: Diagnosis not present

## 2022-07-11 DIAGNOSIS — I10 Essential (primary) hypertension: Secondary | ICD-10-CM | POA: Diagnosis not present

## 2022-07-11 DIAGNOSIS — E782 Mixed hyperlipidemia: Secondary | ICD-10-CM | POA: Diagnosis not present

## 2022-07-11 DIAGNOSIS — F419 Anxiety disorder, unspecified: Secondary | ICD-10-CM | POA: Diagnosis not present

## 2022-07-11 DIAGNOSIS — Z6824 Body mass index (BMI) 24.0-24.9, adult: Secondary | ICD-10-CM | POA: Diagnosis not present

## 2022-07-11 DIAGNOSIS — R5382 Chronic fatigue, unspecified: Secondary | ICD-10-CM | POA: Diagnosis not present

## 2022-07-11 DIAGNOSIS — Z888 Allergy status to other drugs, medicaments and biological substances status: Secondary | ICD-10-CM | POA: Diagnosis not present

## 2022-07-11 DIAGNOSIS — Z0001 Encounter for general adult medical examination with abnormal findings: Secondary | ICD-10-CM | POA: Diagnosis not present

## 2022-07-11 DIAGNOSIS — Z1331 Encounter for screening for depression: Secondary | ICD-10-CM | POA: Diagnosis not present

## 2022-10-22 NOTE — Progress Notes (Signed)
New Patient Note  RE: [name] [name] MRN: [id number] DOB: [birth year] Date of Office Visit: 10/23/2022  Consult requested by: Tharon Aquas, MD Primary care provider: Sharilyn Sites, MD  Chief Complaint: Allergic Reaction (He recently had a sting panel done. Dr.Bina recommended starting venom immunotherapy. /-Had patch testing done had stents put in that caused hives/rash. /He has seen [facility] previously and he couldn't stand wearing clothes. Marthann Schiller leads caused welts, red outline /Sensitivity-Acylates?) and Allergic Rhinitis   History of Present Illness: I had the pleasure of seeing [name] [name] for initial evaluation at the [facility] [place] on 10/23/2022. He is a 71 y.o. male, who is referred here by Dr. Abram Sander (allergist at [facility]) for the evaluation of hymenoptera allergy.  Last seen in our office by Dr. Neldon Mc in 2009 for dermatitis.   09/07/2022 bloodwork was positive to honeybee, wasp, white face hornet, yellow hornet, yellow jacket.  Tryptase level was 9.3  Patient was stung by over 100 yellow jackets as a child and survived it but the last time he got stung by honey bee he started to turn blue. This happened in 1992 and went to the ER. He doesn't recall ever getting epinephrine for this. He did take Benadryl at home and by the time he was seen in the ER was already feeling better.  Last year he got stung by a yellow jacket and took Benadryl afterwards. He had some localized itching.   Patient does spend time outdoors. He does not necessarily carry his Epipen around and has no medical alert bracelet.   He didn't have any work up for this until recently and he was seen by the allergist at the [place] - they do not offer venom immunotherapy and was referred to our office.   Other Patient had heart stents in 2008 and broke out in rash/hives. He is apparently allergic to contrast dye.  He saw PCP, dermatology and allergy (Dr. Neldon Mc) for this. He thinks he is allergic to the  stents but the stents healed well with no issues however he randomly breaks out in rashes. The rash has improved but he noticed that he can only wear certain types of clothing now.  He also seems to be sensitive to any type of adhesive tape - especially from the ekg leads.   Patient had patch testing done which was all negative.  Assessment and Plan: [name] [name] is a 71 y.o. male with: Hymenoptera allergy Referred by [facility] allergist to start venom immunotherapy due to recent bloodwork that was positive to honeybee, wasp, white faced hornet, yellow hornet and yellow jacket. Normal tryptase of 9.3. Patient had reactions in the past requiring ER visit but never needed Epipen. He does like to spend time outdoors. Continue to avoid stinging insects. For mild symptoms you can take over the counter antihistamines such as Benadryl and monitor symptoms closely. If symptoms worsen or if you have severe symptoms including breathing issues, throat closure, significant swelling, whole body hives, severe diarrhea and vomiting, lightheadedness then inject epinephrine and seek immediate medical care afterwards. Reviewed use and stressed importance of having epinephrine device on him at all times especially when outdoors.  Action plan given. Get a medical alert bracelet.  Discussed starting venom immunotherapy which is very effective (this would be 3 injections each time) and building up weekly and then maintenance at every 4 weeks x 2 yrs, then this can be spaced out to every 6-8 weeks.  Patient asked if it's possible to  get honeybee only as he had the worst reaction with that. Recommend that he gets all 3 if he is going to put in the time to come for the injections and yellow jacket actually had a higher value than honeybee. Patient declined to start due to the time commitment needed. He will discuss further with his [place] allergist. He can get the injections at our [place] office which is closer to him. He will let us  know when he is ready to start.   Rash and other nonspecific skin eruption Patient had various questions about rashes and testing for adhesives, stents and acrylate. He had patch testing done which was negative. See below for proper skin care.  Follow up with Dr. Abram Sander regarding your patch testing results.  There is no cure for contact dermatitis - recommendation is to avoid known triggers.   Elevated blood pressure reading Patient did not take his blood pressure medication today yet. Follow up with PCP if consistently elevated.   Return if symptoms worsen or fail to improve.  No orders of the defined types were placed in this encounter.  Lab Orders  No laboratory test(s) ordered today    Other allergy screening: Asthma: no Food allergy: no Medication allergy: yes Hymenoptera allergy: yes  Diagnostics: None.   Past Medical History: Patient Active Problem List   Diagnosis Date Noted   Hymenoptera allergy 10/23/2022   Rash and other nonspecific skin eruption 10/23/2022   Elevated blood pressure reading 10/23/2022   Anxiety 02/27/2022   Back pain 02/27/2022   Benign lipomatous neoplasm of skin and subcutaneous tissue of right arm 02/27/2022   Benign prostatic hyperplasia with lower urinary tract symptoms 02/27/2022   Generalized anxiety disorder 02/27/2022   Hypertensive retinopathy, bilateral 02/27/2022   Impacted cerumen, bilateral 02/27/2022   Insomnia, unspecified 02/27/2022   Male erectile dysfunction, unspecified 02/27/2022   Mixed conductive and sensorineural hearing loss, unilateral, left ear with restricted hearing on the contralateral side 02/27/2022   Myopathy, unspecified 02/27/2022   Obstructive sleep apnea (adult) (pediatric) 02/27/2022   Radiculopathy due to cervical spondylosis 02/27/2022   Rhinitis 02/27/2022   Unspecified cataract 02/27/2022   Aftercare following surgery 01/23/2021   S/P cervical spinal fusion 01/23/2021   Abnormality of esophagus  01/27/2020   Colon cancer screening 01/27/2020   Trigger index finger of left hand 01/06/2020   Acquired trigger finger of left index finger 05/26/2019   Esophageal dysphagia 02/08/2016   Hemorrhoids 02/08/2016   Chronic radicular cervical pain 07/29/2014   GERD (gastroesophageal reflux disease) 10/14/2012   Constipation 10/14/2012   Stiffness of joint, not elsewhere classified, other specified site 02/21/2012   PUD (peptic ulcer disease) 12/21/2011   Dysphagia 07/25/2011   Hyperlipidemia 08/12/2009   ESSENTIAL HYPERTENSION, BENIGN 08/12/2009   CAD, NATIVE VESSEL 08/12/2009   Hypothyroidism 06/11/2009   CUTANEOUS ERUPTIONS, DRUG-INDUCED 01/09/2008   LOCALIZED SUPERFICIAL SWELLING MASS OR LUMP 01/09/2008   Past Medical History:  Diagnosis Date   Allergy    Anxiety    Arthritis    back pain   Avascular necrosis of bone of hip ([place])    right   CAD (coronary artery disease)    stents x3 2008   GERD (gastroesophageal reflux disease)    History of nuclear stress test    a. Myoview 10/17: EF 56%, diaphragmatic attenuation, no ischemia, low risk   HTN (hypertension)    Hyperlipidemia    Myocardial infarction ([place])    2008- stents x 3    Paresthesias  Sleep apnea    no cpap    Thyroid disease    Past Surgical History: Past Surgical History:  Procedure Laterality Date   CARDIAC CATHETERIZATION     3 stents placed   COLONOSCOPY  2011 NUR [facility] SCREENING d50 v5   MILD [place] TICS, ? PREP ARTIFACT V. PROCTITIS-rX:[place]. next TCS 2021.   ESOPHAGEAL DILATION N/A 03/02/2016   Procedure: ESOPHAGEAL DILATION;  Surgeon: Danie Binder, MD;  [facility];  Service: Endoscopy;  Laterality: N/A;   ESOPHAGOGASTRODUODENOSCOPY  10/03/11   small hiatal hernia/gastric ulcers/stricutre in distal esophagus   ESOPHAGOGASTRODUODENOSCOPY N/A 03/02/2016   Dr. Oneida Alar: Schatzki's ring at [place] junction s/p dilation, mild non-erosive gastritis/duodenitis    ESOPHAGOGASTRODUODENOSCOPY (EGD) WITH  PROPOFOL N/A 02/09/2020   Dr. Oneida Alar: Low-grade narrowing Schatzki ring status post dilation, moderate gastritis, no biopsies taken   MASS EXCISION Left 09/30/2014   Procedure: EXCISION MASS LEFT WRIST ;  Surgeon: Daryll Brod, MD;  Location: [place];  Service: Orthopedics;  Laterality: Left;   ROTATOR CUFF REPAIR Left    [facility]    SAVORY DILATION N/A 02/09/2020   Procedure: SAVORY DILATION;  Surgeon: Danie Binder, MD;  [facility];  Service: Endoscopy;  Laterality: N/A;   TONSILLECTOMY  age 78   TOTAL HIP ARTHROPLASTY  age 57   right hip for avascular necrosis of hip and spine   TRIGGER FINGER RELEASE Left 03/08/2020   Procedure: RELEASE TRIGGER FINGER/A-1 PULLEY LEFT INDEX FINGER;  Surgeon: Daryll Brod, MD;  Location: [place];  Service: Orthopedics;  Laterality: Left;  IV REGIONAL FOREARM BLOCK   UPPER GASTROINTESTINAL ENDOSCOPY     with dilation - last 02-2020    Medication List:  Current Outpatient Medications  Medication Sig Dispense Refill   acetaminophen (TYLENOL) 325 MG tablet Take 650 mg by mouth daily as needed (pain).     aspirin EC 81 MG tablet Take 1 tablet (81 mg total) by mouth daily. 90 tablet 3   calcium carbonate (TUMS EX) 750 MG chewable tablet Chew 1 tablet by mouth 2 (two) times daily as needed for heartburn.      carvedilol (COREG) 6.25 MG tablet Take 0.5 tablets by mouth in the morning and at bedtime.     EPINEPHrine 0.3 mg/0.3 mL IJ SOAJ injection Inject 0.3 mg into the muscle as needed for anaphylaxis. 1 each 0   ezetimibe (ZETIA) 10 MG tablet Take 0.5 tablets (5 mg total) by mouth daily.     famotidine (PEPCID) 20 MG tablet Take 1 tablet (20 mg total) by mouth 2 (two) times daily. 180 tablet 3   hydrochlorothiazide (HYDRODIURIL) 25 MG tablet Take 12.5 mg by mouth daily.     levothyroxine (SYNTHROID, LEVOTHROID) 75 MCG tablet Take 75 mcg by mouth daily before breakfast.     losartan (COZAAR) 100 MG tablet Take 50 mg by  mouth 2 (two) times daily.     omeprazole (PRILOSEC) 20 MG capsule Take 20 mg by mouth daily.     pravastatin (PRAVACHOL) 20 MG tablet Take 1 tablet (20 mg total) by mouth every evening. 90 tablet 3   tadalafil (CIALIS) 20 MG tablet TAKE ONE TABLET BY MOUTH AS INSTRUCTED AS NEEDED (TAKE 1 HOUR PRIOR TO SEXUAL ACTIVITY *DO NOT EXCEED 1 DOSE PER 24     triamcinolone cream (KENALOG) 0.1 % Apply 1 application topically 2 (two) times daily as needed (as directed).     No current facility-administered medications  for this visit.   Allergies: Allergies  Allergen Reactions   Atorvastatin Other (See Comments)    Muscle aches   Bee Venom Anaphylaxis, Hives and Swelling   Clopidogrel Bisulfate Hives   Rosuvastatin Other (See Comments)    Muscle aches   Penicillins     Pt unsure what type allergy/ was a child when had reaction Did it involve swelling of the face/tongue/throat, SOB, or low BP? Unknown Did it involve sudden or severe rash/hives, skin peeling, or any reaction on the inside of your mouth or nose? Unknown Did you need to seek medical attention at a hospital or doctor's office? Unknown When did it last happen?       If all above answers are "NO", may proceed with cephalosporin use.    Contrast Media [Iodinated Contrast Media] Hives   Flomax [Tamsulosin]    Gadolinium    Neomycin    Viagra [Sildenafil]    Clopidogrel Rash and Hives   Omeprazole Palpitations   Repatha [Evolocumab] Anxiety    Trouble sleeping   Tape Rash    Tears skin Also ,EKG adhesive pads causes redness, rash and burning sensation at site    Social History: Social History   Socioeconomic History   Marital status: Single    Spouse name: Not on file   Number of children: Not on file   Years of education: Not on file   Highest education level: Not on file  Occupational History   Not on file  Tobacco Use   Smoking status: Former    Packs/day: 1.00    Years: 30.00    Total pack years: 30.00    Types:  Cigarettes    Quit date: 09/30/2007    Years since quitting: 15.0   Smokeless tobacco: Never  Vaping Use   Vaping Use: Never used  Substance and Sexual Activity   Alcohol use: No   Drug use: No   Sexual activity: Yes  Other Topics Concern   Not on file  Social History Narrative   Not on file   Social Determinants of Health   Financial Resource Strain: Not on file  Food Insecurity: Not on file  Transportation Needs: Not on file  Physical Activity: Not on file  Stress: Not on file  Social Connections: Not on file   Lives in a 72 year old house. Smoking: quit in 2008 Occupation: retired  Programme researcher, broadcasting/film/video History: Environmental education officer in the house: no Charity fundraiser in the family room: no Carpet in the bedroom: yes Heating: gas Cooling: heat pump Pet: yes 1 dog  Family History: Family History  Problem Relation Age of Onset   Dementia Mother    Prostate cancer Father 28   Lung cancer Sister 66   Colon cancer Neg Hx    Colon polyps Neg Hx    Anesthesia problems Neg Hx    Hypotension Neg Hx    Malignant hyperthermia Neg Hx    Pseudochol deficiency Neg Hx    Esophageal cancer Neg Hx    Stomach cancer Neg Hx    Rectal cancer Neg Hx    Problem                               Relation Asthma                                   no Eczema  no Food allergy                          no Allergic rhino conjunctivitis     no  Review of Systems  Constitutional:  Negative for appetite change, chills, fever and unexpected weight change.  HENT:  Negative for congestion and rhinorrhea.   Eyes:  Negative for itching.  Respiratory:  Negative for cough, chest tightness, shortness of breath and wheezing.   Cardiovascular:  Negative for chest pain.  Gastrointestinal:  Negative for abdominal pain.  Genitourinary:  Negative for difficulty urinating.  Skin:  Positive for rash.  Neurological:  Negative for headaches.   Objective: BP (!) 152/90   Pulse 60   Temp 98  F (36.7 C)   Resp 20   Ht 5' 11.25" (1.81 m)   Wt 180 lb 8 oz (81.9 kg)   SpO2 100%   BMI 25.00 kg/m  Body mass index is 25 kg/m. Physical Exam Vitals and nursing note reviewed.  Constitutional:      Appearance: Normal appearance. He is well-developed.  HENT:     Head: Normocephalic and atraumatic.     Right Ear: Tympanic membrane and external ear normal.     Left Ear: Tympanic membrane and external ear normal.     Nose: Nose normal.     Mouth/Throat:     Mouth: Mucous membranes are moist.     Pharynx: Oropharynx is clear.  Eyes:     Conjunctiva/sclera: Conjunctivae normal.  Cardiovascular:     Rate and Rhythm: Normal rate and regular rhythm.     Heart sounds: Normal heart sounds. No murmur heard.    No friction rub. No gallop.  Pulmonary:     Effort: Pulmonary effort is normal.     Breath sounds: Normal breath sounds. No wheezing, rhonchi or rales.  Musculoskeletal:     Cervical back: Neck supple.  Skin:    General: Skin is warm.     Findings: No rash.  Neurological:     Mental Status: He is alert and oriented to person, place, and time.   The plan was reviewed with the patient/family, and all questions/concerned were addressed.  It was my pleasure to see [name] [name] today and participate in his care. Please feel free to contact me with any questions or concerns.  Sincerely,  Rexene Alberts, DO Allergy & Immunology  [facility] office: [place] office: [phone]

## 2022-10-23 ENCOUNTER — Ambulatory Visit (INDEPENDENT_AMBULATORY_CARE_PROVIDER_SITE_OTHER): Payer: Medicare Other | Admitting: Allergy

## 2022-10-23 ENCOUNTER — Encounter: Payer: Self-pay | Admitting: Allergy

## 2022-10-23 VITALS — BP 152/90 | HR 60 | Temp 98.0°F | Resp 20 | Ht 71.25 in | Wt 180.5 lb

## 2022-10-23 DIAGNOSIS — Z91038 Other insect allergy status: Secondary | ICD-10-CM

## 2022-10-23 DIAGNOSIS — R030 Elevated blood-pressure reading, without diagnosis of hypertension: Secondary | ICD-10-CM | POA: Insufficient documentation

## 2022-10-23 DIAGNOSIS — L2389 Allergic contact dermatitis due to other agents: Secondary | ICD-10-CM

## 2022-10-23 DIAGNOSIS — R21 Rash and other nonspecific skin eruption: Secondary | ICD-10-CM | POA: Diagnosis not present

## 2022-10-23 NOTE — Assessment & Plan Note (Signed)
Patient did not take his blood pressure medication today yet. . Follow up with PCP if consistently elevated.

## 2022-10-23 NOTE — Assessment & Plan Note (Signed)
Referred by [facility] allergist to start venom immunotherapy due to recent bloodwork that was positive to honeybee, wasp, white faced hornet, yellow hornet and yellow jacket. Normal tryptase of 9.3. Patient had reactions in the past requiring ER visit but never needed Epipen. He does like to spend time outdoors. . Continue to avoid stinging insects. . For mild symptoms you can take over the counter antihistamines such as Benadryl and monitor symptoms closely. If symptoms worsen or if you have severe symptoms including breathing issues, throat closure, significant swelling, whole body hives, severe diarrhea and vomiting, lightheadedness then inject epinephrine and seek immediate medical care afterwards. o Reviewed use and stressed importance of having epinephrine device on him at all times especially when outdoors.  . Action plan given. . Get a medical alert bracelet.  . Discussed starting venom immunotherapy which is very effective (this would be 3 injections each time) and building up weekly and then maintenance at every 4 weeks x 2 yrs, then this can be spaced out to every 6-8 weeks.  o Patient asked if it's possible to get honeybee only as he had the worst reaction with that. Recommend that he gets all 3 if he is going to put in the time to come for the injections and yellow jacket actually had a higher value than honeybee. o Patient declined to start due to the time commitment needed. He will discuss further with his [place] allergist. o He can get the injections at our [place] office which is closer to him. o He will let us know when he is ready to start.

## 2022-10-23 NOTE — Patient Instructions (Addendum)
Hymenoptera allergy Continue to avoid stinging insects. For mild symptoms you can take over the counter antihistamines such as Benadryl and monitor symptoms closely. If symptoms worsen or if you have severe symptoms including breathing issues, throat closure, significant swelling, whole body hives, severe diarrhea and vomiting, lightheadedness then inject epinephrine and seek immediate medical care afterwards. Action plan given. Get a medical alert bracelet. Consider allergy injections - handout given. This can be given at the [place] office. Let us know when ready to start.   Dermatitis See below for proper skin care.  Follow up with Dr. Abram Sander regarding your patch testing results.   Follow up if needed.   Skin care recommendations  Bath time: Always use lukewarm water. AVOID very hot or cold water. Keep bathing time to 5-10 minutes. Do NOT use bubble bath. Use a mild soap and use just enough to wash the dirty areas. Do NOT scrub skin vigorously.  After bathing, pat dry your skin with a towel. Do NOT rub or scrub the skin.  Moisturizers and prescriptions:  ALWAYS apply moisturizers immediately after bathing (within 3 minutes). This helps to lock-in moisture. Use the moisturizer several times a day over the whole body. Good summer moisturizers include: Aveeno, CeraVe, Cetaphil. Good winter moisturizers include: Aquaphor, Vaseline, Cerave, Cetaphil, Eucerin, Vanicream. When using moisturizers along with medications, the moisturizer should be applied about one hour after applying the medication to prevent diluting effect of the medication or moisturize around where you applied the medications. When not using medications, the moisturizer can be continued twice daily as maintenance.  Laundry and clothing: Avoid laundry products with added color or perfumes. Use unscented hypo-allergenic laundry products such as Tide free, Cheer free & gentle, and All free and clear.  If the skin still  seems dry or sensitive, you can try double-rinsing the clothes. Avoid tight or scratchy clothing such as wool. Do not use fabric softeners or dyer sheets.

## 2022-10-23 NOTE — Assessment & Plan Note (Signed)
Patient had various questions about rashes and testing for adhesives, stents and acrylate. He had patch testing done which was negative. . See below for proper skin care.  . Follow up with Dr. Abram Sander regarding your patch testing results.  . There is no cure for contact dermatitis - recommendation is to avoid known triggers.

## 2023-01-07 LAB — LAB REPORT - SCANNED
Hemoglobin A1c: 6.5
eGFR: 91

## 2023-01-22 ENCOUNTER — Ambulatory Visit: Payer: Medicare Other | Admitting: Internal Medicine

## 2023-01-24 DIAGNOSIS — G729 Myopathy, unspecified: Secondary | ICD-10-CM | POA: Diagnosis not present

## 2023-01-24 DIAGNOSIS — J069 Acute upper respiratory infection, unspecified: Secondary | ICD-10-CM | POA: Diagnosis not present

## 2023-01-24 DIAGNOSIS — Z6825 Body mass index (BMI) 25.0-25.9, adult: Secondary | ICD-10-CM | POA: Diagnosis not present

## 2023-02-17 NOTE — Progress Notes (Unsigned)
Cardiology Office Note   Date:  02/19/2023   ID:  [name], [name] 1951-07-25, MRN [id number]  PCP:  Sharilyn Sites, MD  Cardiologist:   Dorris Carnes, MD   F/U CAD      History of Present Illness: [name] [name] is a 72 y.o. male with a history ofCAD, HTN, PV disease, and HLD seen for numbness and tingling.    History of CAD s/p NSTEMI in 2008 tx with PCI of bifurcational lesion in LAD/Dx with 3 DES. He has a hx of rash with Plavix and Ticlid.  He underwent Plavix desensitization in 2009. Intolerance to Statin and Repatha (caused nightmares). Low risk stress test 09/2016.   The pt had limited episode of exertional CP in AUg/2020.  SHort lived  F/U stress test low risk  No ischemia   Breakfast:  Oatmeal, blueberries or banana Lunch   [employer] or [employer] and  [employer]  Meat/vegg  Patient says 1/2 gallon sweet tea will last him 4 to 5 days  I saw the pt in Jan  2023    He denies CP  But he says that when he walks up hills he gets SOB.  He did not have this before     He also notes that he has some burning in his calves when walking  Relieved with rest.    Pt says he is putting on a little weight    new        Current Meds  Medication Sig   acetaminophen (TYLENOL) 325 MG tablet Take 650 mg by mouth daily as needed (pain).   aspirin EC 81 MG tablet Take 1 tablet (81 mg total) by mouth daily.   calcium carbonate (TUMS EX) 750 MG chewable tablet Chew 1 tablet by mouth 2 (two) times daily as needed for heartburn.    carvedilol (COREG) 6.25 MG tablet Take 0.5 tablets by mouth in the morning and at bedtime.   EPINEPHrine 0.3 mg/0.3 mL IJ SOAJ injection Inject 0.3 mg into the muscle as needed for anaphylaxis.   ezetimibe (ZETIA) 10 MG tablet Take 0.5 tablets (5 mg total) by mouth daily.   famotidine (PEPCID) 20 MG tablet Take 1 tablet (20 mg total) by mouth 2 (two) times daily.   hydrochlorothiazide (HYDRODIURIL) 25 MG tablet Take 12.5 mg by mouth 2 (two) times daily.    levothyroxine (SYNTHROID, LEVOTHROID) 75 MCG tablet Take 75 mcg by mouth daily before breakfast.   losartan (COZAAR) 100 MG tablet Take 50 mg by mouth 2 (two) times daily.   omeprazole (PRILOSEC) 20 MG capsule Take 20 mg by mouth daily.   pravastatin (PRAVACHOL) 40 MG tablet Take 20 mg by mouth 2 (two) times daily.   tadalafil (CIALIS) 20 MG tablet TAKE ONE TABLET BY MOUTH AS INSTRUCTED AS NEEDED (TAKE 1 HOUR PRIOR TO SEXUAL ACTIVITY *DO NOT EXCEED 1 DOSE PER 24   triamcinolone cream (KENALOG) 0.1 % Apply 1 application topically 2 (two) times daily as needed (as directed).     Allergies:   Atorvastatin, Bee venom, Clopidogrel bisulfate, Rosuvastatin, Penicillins, Contrast media [iodinated contrast media], Flomax [tamsulosin], Gadolinium, Neomycin, Viagra [sildenafil], Clopidogrel, Omeprazole, Repatha [evolocumab], and Tape   Past Medical History:  Diagnosis Date   Allergy    Anxiety    Arthritis    back pain   Avascular necrosis of bone of hip ([place])    right   CAD (coronary artery disease)    stents x3 2008   GERD (gastroesophageal  reflux disease)    History of nuclear stress test    a. Myoview 10/17: EF 56%, diaphragmatic attenuation, no ischemia, low risk   HTN (hypertension)    Hyperlipidemia    Myocardial infarction ([place])    2008- stents x 3    Paresthesias    Sleep apnea    no cpap    Thyroid disease     Past Surgical History:  Procedure Laterality Date   CARDIAC CATHETERIZATION     3 stents placed   COLONOSCOPY  2011 NUR [facility] SCREENING d50 v5   MILD [place] TICS, ? PREP ARTIFACT V. PROCTITIS-rX:[place]. next TCS 2021.   ESOPHAGEAL DILATION N/A 03/02/2016   Procedure: ESOPHAGEAL DILATION;  Surgeon: Danie Binder, MD;  [facility];  Service: Endoscopy;  Laterality: N/A;   ESOPHAGOGASTRODUODENOSCOPY  10/03/11   small hiatal hernia/gastric ulcers/stricutre in distal esophagus   ESOPHAGOGASTRODUODENOSCOPY N/A 03/02/2016   Dr. Oneida Alar: Schatzki's ring at [place] junction s/p  dilation, mild non-erosive gastritis/duodenitis    ESOPHAGOGASTRODUODENOSCOPY (EGD) WITH PROPOFOL N/A 02/09/2020   Dr. Oneida Alar: Low-grade narrowing Schatzki ring status post dilation, moderate gastritis, no biopsies taken   MASS EXCISION Left 09/30/2014   Procedure: EXCISION MASS LEFT WRIST ;  Surgeon: Daryll Brod, MD;  Location: [facility];  Service: Orthopedics;  Laterality: Left;   ROTATOR CUFF REPAIR Left    [facility]    SAVORY DILATION N/A 02/09/2020   Procedure: SAVORY DILATION;  Surgeon: Danie Binder, MD;  [facility];  Service: Endoscopy;  Laterality: N/A;   TONSILLECTOMY  age 13   TOTAL HIP ARTHROPLASTY  age 42   right hip for avascular necrosis of hip and spine   TRIGGER FINGER RELEASE Left 03/08/2020   Procedure: RELEASE TRIGGER FINGER/A-1 PULLEY LEFT INDEX FINGER;  Surgeon: Daryll Brod, MD;  Location: [facility];  Service: Orthopedics;  Laterality: Left;  IV REGIONAL FOREARM BLOCK   UPPER GASTROINTESTINAL ENDOSCOPY     with dilation - last 02-2020      Social History:  The patient  reports that he quit smoking about 15 years ago. His smoking use included cigarettes. He has a 30.00 pack-year smoking history. He has never used smokeless tobacco. He reports that he does not drink alcohol and does not use drugs.   Family History:  The patient's family history includes Dementia in his mother; Lung cancer (age of onset: 61) in his sister; Prostate cancer (age of onset: 60) in his father.    ROS:  Please see the history of present illness. All other systems are reviewed and  Negative to the above problem except as noted.    PHYSICAL EXAM: VS:  BP 120/80   Pulse 74   Ht 5' 11.25" (1.81 m)   Wt 184 lb 9.6 oz (83.7 kg)   SpO2 96%   BMI 25.57 kg/m   GEN: Well nourished, well developed, in no acute distress  HEENT: normal  Neck: no JVD, carotid bruit  Cardiac: RRR; no murmur  NO LE  edema  Respiratory:  clear to auscultation   GI: soft,  nontender, nondistended, + BS  No hepatomegaly  MS: no deformity Moving all extremities   Skin: warm and dry Neuro:  Strength and sensation are intact Psych: euthymic mood, full affect   EKG:  EKG is not ordered today. NSR 74 bpm    Lipid Panel    Component Value Date/Time   CHOL 144 01/04/2022 1059   TRIG 82 01/04/2022  1059   TRIG 107 08/15/2009 1335   HDL 35 (L) 01/04/2022 1059   CHOLHDL 4.1 01/04/2022 1059   CHOLHDL 4.5 01/30/2016 1005   VLDL 24 01/30/2016 1005   LDLCALC 93 01/04/2022 1059   LDLCALC 87 08/15/2009 1335      Wt Readings from Last 3 Encounters:  02/19/23 184 lb 9.6 oz (83.7 kg)  10/23/22 180 lb 8 oz (81.9 kg)  01/04/22 181 lb 6.4 oz (82.3 kg)      ASSESSMENT AND PLAN:  1  CAD  Pt with remote intervention  He is having some dyspnea with walking hills   Will set up for a GXT Myoview to evaluate for ischemia   2  ? Claudication   Symptoms of burning in calves   Set up for LE USN   3   HL   Pt has had problems with meds    Intolerant to Praluent    On pravstatin and Zetia    Lipids at the [facility] in Jan 2024 showed LDL 77 HDL 38   Follow   4  HTN  BP is controlled .  Continue current meds   5  DM   A1C is 6.5    Discussed diet Cut out sweets        Keep active    F/U next winter    Current medicines are reviewed at length with the patient today.  The patient does not have concerns regarding medicines.  Signed, Dorris Carnes, MD  02/19/2023 8:51 PM    [facility] [address], [place], [place]  [address] Phone: [phone]; Fax: [phone]

## 2023-02-19 ENCOUNTER — Encounter: Payer: Self-pay | Admitting: Internal Medicine

## 2023-02-19 ENCOUNTER — Ambulatory Visit: Payer: Medicare Other | Attending: Internal Medicine | Admitting: Internal Medicine

## 2023-02-19 VITALS — BP 120/80 | HR 74 | Ht 71.25 in | Wt 184.6 lb

## 2023-02-19 DIAGNOSIS — I739 Peripheral vascular disease, unspecified: Secondary | ICD-10-CM | POA: Insufficient documentation

## 2023-02-19 DIAGNOSIS — I2510 Atherosclerotic heart disease of native coronary artery without angina pectoris: Secondary | ICD-10-CM | POA: Insufficient documentation

## 2023-02-19 NOTE — Patient Instructions (Signed)
Medication Instructions:   *If you need a refill on your cardiac medications before your next appointment, please call your pharmacy*   Lab Work:  If you have labs (blood work) drawn today and your tests are completely normal, you will receive your results only by: [place] (if you have MyChart) OR A paper copy in the mail If you have any lab test that is abnormal or we need to change your treatment, we will call you to review the results.   Testing/Procedures:   LOWER EXTREMITY DOPPLER  STRESS MYOVIEW    Follow-Up: At [facility], you and your health needs are our priority.  As part of our continuing mission to provide you with exceptional heart care, we have created designated Provider Care Teams.  These Care Teams include your primary Cardiologist (physician) and Advanced Practice Providers (APPs -  Physician Assistants and Nurse Practitioners) who all work together to provide you with the care you need, when you need it.  We recommend signing up for the patient portal called "MyChart".  Sign up information is provided on this After Visit Summary.  MyChart is used to connect with patients for Virtual Visits (Telemedicine).  Patients are able to view lab/test results, encounter notes, upcoming appointments, etc.  Non-urgent messages can be sent to your provider as well.   To learn more about what you can do with MyChart, go to [email or web address].    Your next appointment:   9 month(s)  Provider:   Dorris Carnes, MD     Other Instructions

## 2023-03-05 ENCOUNTER — Encounter (HOSPITAL_COMMUNITY): Payer: Medicare Other

## 2023-03-08 ENCOUNTER — Telehealth: Payer: Self-pay | Admitting: Internal Medicine

## 2023-03-08 NOTE — Telephone Encounter (Signed)
Lipids from [place]   LDL 77    On Zetia and pravastatin  Cannot ot Keep watching diet .  Lots of veggies, minimize high processed foods  Lots

## 2023-03-11 NOTE — Telephone Encounter (Signed)
MyChart message sent to the pt.

## 2023-03-12 ENCOUNTER — Ambulatory Visit (HOSPITAL_COMMUNITY)
Admission: RE | Admit: 2023-03-12 | Discharge: 2023-03-12 | Disposition: A | Discharge status: Home / Self Care | Payer: Medicare Other | Source: Ambulatory Visit | Attending: Internal Medicine | Admitting: Internal Medicine

## 2023-03-12 ENCOUNTER — Telehealth: Payer: Self-pay

## 2023-03-12 DIAGNOSIS — I2510 Atherosclerotic heart disease of native coronary artery without angina pectoris: Secondary | ICD-10-CM | POA: Insufficient documentation

## 2023-03-12 DIAGNOSIS — I739 Peripheral vascular disease, unspecified: Secondary | ICD-10-CM

## 2023-03-12 LAB — VAS US ABI WITH/WO TBI
Left ABI: 0.97
Right ABI: 0.98

## 2023-03-12 NOTE — Telephone Encounter (Signed)
The patient has been notified of the result and verbalized understanding.  All questions (if any) were answered. Referral sent to Dr. Gwenlyn Found per Dr. Harrington Challenger request.  Gershon Crane, LPN [phone] PM

## 2023-04-02 ENCOUNTER — Telehealth: Payer: Self-pay

## 2023-04-02 DIAGNOSIS — R0600 Dyspnea, unspecified: Secondary | ICD-10-CM

## 2023-04-03 NOTE — Telephone Encounter (Signed)
Stress test ordered;

## 2023-04-10 ENCOUNTER — Telehealth (HOSPITAL_COMMUNITY): Payer: Self-pay | Admitting: *Deleted

## 2023-04-10 NOTE — Telephone Encounter (Signed)

## 2023-04-12 ENCOUNTER — Ambulatory Visit (HOSPITAL_COMMUNITY): Payer: Medicare Other | Attending: Cardiovascular Disease

## 2023-04-12 DIAGNOSIS — I739 Peripheral vascular disease, unspecified: Secondary | ICD-10-CM | POA: Diagnosis not present

## 2023-04-12 DIAGNOSIS — I2510 Atherosclerotic heart disease of native coronary artery without angina pectoris: Secondary | ICD-10-CM | POA: Diagnosis not present

## 2023-04-12 LAB — MYOCARDIAL PERFUSION IMAGING
Angina Index: 0
Duke Treadmill Score: 1
Estimated workload: 7
Exercise duration (min): 6 min
Exercise duration (sec): 1 s
LV dias vol: 62 mL (ref 62–150)
LV sys vol: 26 mL
MPHR: 148 {beats}/min
Nuc Stress EF: 59 %
Peak HR: 136 {beats}/min
Percent HR: 91 %
Rest HR: 60 {beats}/min
Rest Nuclear Isotope Dose: 10.7 mCi
SDS: 0
SRS: 0
SSS: 0
ST Depression (mm): 1 mm
Stress Nuclear Isotope Dose: 32.6 mCi
TID: 0.89

## 2023-04-12 MED ORDER — TECHNETIUM TC 99M TETROFOSMIN IV KIT
10.7000 | PACK | Freq: Once | INTRAVENOUS | Status: AC | PRN
  Administered 2023-04-12: 10.7 via INTRAVENOUS

## 2023-04-12 MED ORDER — TECHNETIUM TC 99M TETROFOSMIN IV KIT
32.6000 | PACK | Freq: Once | INTRAVENOUS | Status: AC | PRN
  Administered 2023-04-12: 32.6 via INTRAVENOUS

## 2023-04-17 ENCOUNTER — Institutional Professional Consult (permissible substitution): Payer: Medicare Other | Admitting: Cardiovascular Disease

## 2023-05-01 ENCOUNTER — Encounter: Payer: Self-pay | Admitting: Cardiovascular Disease

## 2023-05-01 ENCOUNTER — Ambulatory Visit: Payer: Medicare Other | Attending: Cardiovascular Disease | Admitting: Cardiovascular Disease

## 2023-05-01 VITALS — BP 118/66 | HR 62 | Ht 72.0 in | Wt 182.0 lb

## 2023-05-01 DIAGNOSIS — I739 Peripheral vascular disease, unspecified: Secondary | ICD-10-CM | POA: Diagnosis not present

## 2023-05-01 NOTE — Patient Instructions (Signed)
Medication Instructions:  Your physician recommends that you continue on your current medications as directed. Please refer to the Current Medication list given to you today.  *If you need a refill on your cardiac medications before your next appointment, please call your pharmacy*   Lab Work: None   Testing/Procedures: None   Follow-Up: At [facility], you and your health needs are our priority.  As part of our continuing mission to provide you with exceptional heart care, we have created designated Provider Care Teams.  These Care Teams include your primary Cardiologist (physician) and Advanced Practice Providers (APPs -  Physician Assistants and Nurse Practitioners) who all work together to provide you with the care you need, when you need it.   Your next appointment:   3 month(s)  Provider:   Nanetta Batty, MD

## 2023-05-01 NOTE — Assessment & Plan Note (Signed)
Mr. Eberwein was referred to me by Dr. Tenny Craw for evaluation of potential PAD.  He does have positive vascular risk factors including treated hypertension and hyperlipidemia.  He has CAD having undergone stenting back in 2008.  He said he developed cramping in his legs after starting gabapentin which was recently discontinued.  His left leg is most clinically involved.  He had Dopplers performed 03/12/2023 revealing essentially normal ABIs with a mild stenosis in his left popliteal artery.  He has bounding left dorsalis pedis pulse.  At this point, I do not think he needs further evaluation.  He should get lower extremity arterial Doppler studies on annual basis.  I will see him back in 3 months.

## 2023-05-01 NOTE — Progress Notes (Signed)
05/01/2023 [name] [name]   16-Sep-1951  [id number]  Primary Physician Assunta Found, MD Primary Cardiologist: [name] Gess MD [name] [name], [place]  HPI:  [name] [name] is a 72 y.o. 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  [name] [name] before he retired.  He was referred by Dr. Tenny [name], his cardiologist, for evaluation of PAD.  He did smoke remotely in 22,008 when he had a non-STEMI and bifurcation stenting of his LAD and circumflex.  He apparently needed to undergo desensitization patient from Plavix because of allergic reaction.  He is cardiovascular stable.  He apparently started gabapentin which he had a reaction to and after that developed some pain in his legs.  He since stopped the gabapentin.  His symptoms do Staticin like claudication left greater than right although he had recent Doppler studies performed 03/12/2023 revealing essentially normal ABIs bilaterally with a mild blockage in his left popliteal artery.   Current Meds  Medication Sig   acetaminophen (TYLENOL) 325 MG tablet Take 650 mg by mouth daily as needed (pain).   aspirin EC 81 MG tablet Take 1 tablet (81 mg total) by mouth daily.   calcium carbonate (TUMS EX) 750 MG chewable tablet Chew 1 tablet by mouth 2 (two) times daily as needed for heartburn.    carvedilol (COREG) 6.25 MG tablet Take 0.5 tablets by mouth in the morning and at bedtime.   EPINEPHrine 0.3 mg/0.3 mL IJ SOAJ injection Inject 0.3 mg into the muscle as needed for anaphylaxis.   ezetimibe (ZETIA) 10 MG tablet Take 0.5 tablets (5 mg total) by mouth daily.   famotidine (PEPCID) 20 MG tablet Take 1 tablet (20 mg total) by mouth 2 (two) times daily.   hydrochlorothiazide (HYDRODIURIL) 25 MG tablet Take 12.5 mg by mouth 2 (two) times daily.   levothyroxine (SYNTHROID, LEVOTHROID) 75 MCG tablet Take 75 mcg by mouth daily before breakfast.   losartan (COZAAR) 100 MG  tablet Take 50 mg by mouth 2 (two) times daily.   omeprazole (PRILOSEC) 20 MG capsule Take 20 mg by mouth daily.   pravastatin (PRAVACHOL) 40 MG tablet Take 20 mg by mouth 2 (two) times daily.   tadalafil (CIALIS) 20 MG tablet TAKE ONE TABLET BY MOUTH AS INSTRUCTED AS NEEDED (TAKE 1 HOUR PRIOR TO SEXUAL ACTIVITY *DO NOT EXCEED 1 DOSE PER 24   triamcinolone cream (KENALOG) 0.1 % Apply 1 application topically 2 (two) times daily as needed (as directed).     Allergies  Allergen Reactions   Atorvastatin Other (See Comments)    Muscle aches   Bee Venom Anaphylaxis, Hives and Swelling   Clopidogrel Bisulfate Hives   Rosuvastatin Other (See Comments)    Muscle aches   Penicillins     Pt unsure what type allergy/ was a child when had reaction Did it involve swelling of the face/tongue/throat, SOB, or low BP? Unknown Did it involve sudden or severe rash/hives, skin peeling, or any reaction on the inside of your mouth or nose? Unknown Did you need to seek medical attention at a hospital or doctor's office? Unknown When did it last happen?       If all above answers are "NO", may proceed with cephalosporin use.    Contrast Media [Iodinated Contrast Media] Hives   Flomax [Tamsulosin]    Gadolinium    Neomycin    Viagra [Sildenafil]    Clopidogrel Rash and Hives  Omeprazole Palpitations   Repatha [Evolocumab] Anxiety    Trouble sleeping   Tape Rash    Tears skin Also ,EKG adhesive pads causes redness, rash and burning sensation at site     Social History   Socioeconomic History   Marital status: Single    Spouse name: Not on file   Number of children: Not on file   Years of education: Not on file   Highest education level: Not on file  Occupational History   Not on file  Tobacco Use   Smoking status: Former    Packs/day: 1.00    Years: 30.00    Additional pack years: 0.00    Total pack years: 30.00    Types: Cigarettes    Quit date: 09/30/2007    Years since quitting: 15.5    Smokeless tobacco: Never  Vaping Use   Vaping Use: Never used  Substance and Sexual Activity   Alcohol use: No   Drug use: No   Sexual activity: Yes  Other Topics Concern   Not on file  Social History Narrative   Not on file   Social Determinants of Health   Financial Resource Strain: Not on file  Food Insecurity: Not on file  Transportation Needs: Not on file  Physical Activity: Not on file  Stress: Not on file  Social Connections: Not on file  Intimate Partner Violence: Not on file     Review of Systems: General: negative for chills, fever, night sweats or weight changes.  Cardiovascular: negative for chest pain, dyspnea on exertion, edema, orthopnea, palpitations, paroxysmal nocturnal dyspnea or shortness of breath Dermatological: negative for rash Respiratory: negative for cough or wheezing Urologic: negative for hematuria Abdominal: negative for nausea, vomiting, diarrhea, bright red blood per rectum, melena, or hematemesis Neurologic: negative for visual changes, syncope, or dizziness All other systems reviewed and are otherwise negative except as noted above.    Blood pressure 118/66, pulse 62, height 6' (1.829 m), weight 182 lb (82.6 kg).  General appearance: alert and no distress Neck: no adenopathy, no carotid bruit, no JVD, supple, symmetrical, trachea midline, and thyroid not enlarged, symmetric, no tenderness/mass/nodules Lungs: clear to auscultation bilaterally Heart: regular rate and rhythm, S1, S2 normal, no murmur, click, rub or gallop Extremities: extremities normal, atraumatic, no cyanosis or edema Pulses: 2+ and symmetric Skin: Skin color, texture, turgor normal. No rashes or lesions Neurologic: Grossly normal  EKG not performed today  ASSESSMENT AND PLAN:   Peripheral arterial disease [facility]) Mr. [name] [name] was referred to me by Dr. Tenny [name] for evaluation of potential PAD.  He does have positive vascular risk factors including treated hypertension and  hyperlipidemia.  He has CAD having undergone stenting back in 2008.  He said he developed cramping in his legs after starting gabapentin which was recently discontinued.  His left leg is most clinically involved.  He had Dopplers performed 03/12/2023 revealing essentially normal ABIs with a mild stenosis in his left popliteal artery.  He has bounding left dorsalis pedis pulse.  At this point, I do not think he needs further evaluation.  He should get lower extremity arterial Doppler studies on annual basis.  I will see him back in 3 months.     [name] Gess MD FACP,FACC,FAHA, [facility] 05/01/2023 11:03 AM

## 2023-05-02 DIAGNOSIS — Z6825 Body mass index (BMI) 25.0-25.9, adult: Secondary | ICD-10-CM | POA: Diagnosis not present

## 2023-05-02 DIAGNOSIS — J3089 Other allergic rhinitis: Secondary | ICD-10-CM | POA: Diagnosis not present

## 2023-05-02 DIAGNOSIS — E663 Overweight: Secondary | ICD-10-CM | POA: Diagnosis not present

## 2023-05-02 DIAGNOSIS — G729 Myopathy, unspecified: Secondary | ICD-10-CM | POA: Diagnosis not present

## 2023-05-02 DIAGNOSIS — J069 Acute upper respiratory infection, unspecified: Secondary | ICD-10-CM | POA: Diagnosis not present

## 2023-07-20 ENCOUNTER — Encounter: Payer: Self-pay | Admitting: Internal Medicine

## 2023-07-20 DIAGNOSIS — Z79899 Other long term (current) drug therapy: Secondary | ICD-10-CM

## 2023-07-20 DIAGNOSIS — E875 Hyperkalemia: Secondary | ICD-10-CM

## 2023-07-23 NOTE — Telephone Encounter (Signed)
Check BMET in the next few day   K was 5.2 at [facility] clinic

## 2023-07-23 NOTE — Telephone Encounter (Signed)
COMPREHENSIVE METABOLIC PANEL (ATELLICA) POTASSIUM [MOLES/VOLUME] IN SERUM OR PLASMA 5.2 mmol/L 3.4 - 5.0 07/17/2023 H

## 2023-07-25 ENCOUNTER — Encounter: Payer: Self-pay | Admitting: General Surgery

## 2023-07-25 ENCOUNTER — Ambulatory Visit (INDEPENDENT_AMBULATORY_CARE_PROVIDER_SITE_OTHER): Payer: Medicare Other | Admitting: General Surgery

## 2023-07-25 VITALS — BP 130/88 | HR 69 | Temp 97.8°F | Resp 14 | Ht 72.0 in | Wt 180.0 lb

## 2023-07-25 DIAGNOSIS — D1724 Benign lipomatous neoplasm of skin and subcutaneous tissue of left leg: Secondary | ICD-10-CM

## 2023-07-25 DIAGNOSIS — D1721 Benign lipomatous neoplasm of skin and subcutaneous tissue of right arm: Secondary | ICD-10-CM

## 2023-07-25 DIAGNOSIS — D179 Benign lipomatous neoplasm, unspecified: Secondary | ICD-10-CM

## 2023-07-25 NOTE — Patient Instructions (Signed)
Call if something changes. Can take Tylenol before coming to help with pain after.

## 2023-07-25 NOTE — Progress Notes (Signed)
[facility] History and Physical  Reason for Referral: Lipomas  Referring Physician: Assunta Found, MD   Chief Complaint   New Patient (Initial Visit)     [name] [name] is a 72 y.o. male.  HPI: [name] [name] is a very sweet 72 yo who has had lipomas removed multiple times in the past. Dr. Malvin Johns removed some years ago and [facility] his [facility] this past [facility]. He says he just gets new ones and that they grow larger. He was put to sleep for his lipoma removal at the [place] as they did take several on the abdominal area. He is wanting to get a few others removed. Dr. Malvin Johns did local in the past.   Past Medical History:  Diagnosis Date   Allergy    Anxiety    Arthritis    back pain   Avascular necrosis of bone of hip (HCC)    right   CAD (coronary artery disease)    stents x3 2008   GERD (gastroesophageal reflux disease)    History of nuclear stress test    a. Myoview 10/17: EF 56%, diaphragmatic attenuation, no ischemia, low risk   HTN (hypertension)    Hyperlipidemia    Myocardial infarction (HCC)    2008- stents x 3    Paresthesias    Sleep apnea    no cpap    Thyroid disease     Past Surgical History:  Procedure Laterality Date   CARDIAC CATHETERIZATION     3 stents placed   COLONOSCOPY  2011 NUR [facility] SCREENING d50 v5   MILD [place] TICS, ? PREP ARTIFACT V. PROCTITIS-rX:[place]. next TCS 2021.   ESOPHAGEAL DILATION N/A 03/02/2016   Procedure: ESOPHAGEAL DILATION;  Surgeon: West Bali, MD;  [facility];  Service: Endoscopy;  Laterality: N/A;   ESOPHAGOGASTRODUODENOSCOPY  10/03/11   small hiatal hernia/gastric ulcers/stricutre in distal esophagus   ESOPHAGOGASTRODUODENOSCOPY N/A 03/02/2016   Dr. Darrick Penna: Schatzki's ring at GE junction s/p dilation, mild non-erosive gastritis/duodenitis    ESOPHAGOGASTRODUODENOSCOPY (EGD) WITH PROPOFOL N/A 02/09/2020   Dr. Darrick Penna: Low-grade narrowing Schatzki ring status post dilation,  moderate gastritis, no biopsies taken   MASS EXCISION Left 09/30/2014   Procedure: EXCISION MASS LEFT WRIST ;  Surgeon: Cindee Salt, MD;  Location: [facility];  Service: Orthopedics;  Laterality: Left;   ROTATOR CUFF REPAIR Left    [facility]    SAVORY DILATION N/A 02/09/2020   Procedure: SAVORY DILATION;  Surgeon: West Bali, MD;  [facility];  Service: Endoscopy;  Laterality: N/A;   TONSILLECTOMY  age 23   TOTAL HIP ARTHROPLASTY  age 66   right hip for avascular necrosis of hip and spine   TRIGGER FINGER RELEASE Left 03/08/2020   Procedure: RELEASE TRIGGER FINGER/A-1 PULLEY LEFT INDEX FINGER;  Surgeon: Cindee Salt, MD;  Location: [facility];  Service: Orthopedics;  Laterality: Left;  IV REGIONAL FOREARM BLOCK   UPPER GASTROINTESTINAL ENDOSCOPY     with dilation - last 02-2020     Family History  Problem Relation Age of Onset   Dementia Mother    Prostate cancer Father 55   Lung cancer Sister 38   Colon cancer Neg Hx    Colon polyps Neg Hx    Anesthesia problems Neg Hx    Hypotension Neg Hx    Malignant hyperthermia Neg Hx    Pseudochol deficiency Neg Hx    Esophageal cancer  Neg Hx    Stomach cancer Neg Hx    Rectal cancer Neg Hx     Social History   Tobacco Use   Smoking status: Former    Current packs/day: 0.00    Average packs/day: 1 pack/day for 30.0 years (30.0 ttl pk-yrs)    Types: Cigarettes    Start date: 09/29/1977    Quit date: 09/30/2007    Years since quitting: 15.8   Smokeless tobacco: Never  Vaping Use   Vaping status: Never Used  Substance Use Topics   Alcohol use: No   Drug use: No    Medications: I have reviewed the patient's current medications. Allergies as of 07/25/2023       Reactions   Atorvastatin Other (See Comments)   Muscle aches   Bee Venom Anaphylaxis, Hives, Swelling   Clopidogrel Bisulfate Hives   Rosuvastatin Other (See Comments)   Muscle aches   Penicillins    Pt unsure what type allergy/  was a child when had reaction Did it involve swelling of the face/tongue/throat, SOB, or low BP? Unknown Did it involve sudden or severe rash/hives, skin peeling, or any reaction on the inside of your mouth or nose? Unknown Did you need to seek medical attention at a hospital or doctor's office? Unknown When did it last happen?       If all above answers are "NO", may proceed with cephalosporin use.   Contrast Media [iodinated Contrast Media] Hives   Flomax [tamsulosin]    Gadolinium    Neomycin    Viagra [sildenafil]    Clopidogrel Rash, Hives   Omeprazole Palpitations   Repatha [evolocumab] Anxiety   Trouble sleeping   Tape Rash   Tears skin Also ,EKG adhesive pads causes redness, rash and burning sensation at site         Medication List        Accurate as of July 25, 2023  1:45 PM. If you have any questions, ask your nurse or doctor.          acetaminophen 325 MG tablet Commonly known as: TYLENOL Take 650 mg by mouth daily as needed (pain).   aspirin EC 81 MG tablet Take 1 tablet (81 mg total) by mouth daily.   calcium carbonate 750 MG chewable tablet Commonly known as: TUMS EX Chew 1 tablet by mouth 2 (two) times daily as needed for heartburn.   carvedilol 6.25 MG tablet Commonly known as: COREG Take 0.5 tablets by mouth in the morning and at bedtime.   EPINEPHrine 0.3 mg/0.3 mL Soaj injection Commonly known as: EPI-PEN Inject 0.3 mg into the muscle as needed for anaphylaxis.   ezetimibe 10 MG tablet Commonly known as: Zetia Take 0.5 tablets (5 mg total) by mouth daily.   famotidine 20 MG tablet Commonly known as: Pepcid Take 1 tablet (20 mg total) by mouth 2 (two) times daily.   hydrochlorothiazide 25 MG tablet Commonly known as: HYDRODIURIL Take 12.5 mg by mouth 2 (two) times daily.   levothyroxine 75 MCG tablet Commonly known as: SYNTHROID Take 75 mcg by mouth daily before breakfast.   losartan 100 MG tablet Commonly known as: COZAAR Take  50 mg by mouth 2 (two) times daily.   omeprazole 20 MG capsule Commonly known as: PRILOSEC Take 20 mg by mouth daily.   pravastatin 40 MG tablet Commonly known as: PRAVACHOL Take 20 mg by mouth 2 (two) times daily.   tadalafil 20 MG tablet Commonly known as: CIALIS TAKE ONE TABLET  BY MOUTH AS INSTRUCTED AS NEEDED (TAKE 1 HOUR PRIOR TO SEXUAL ACTIVITY *DO NOT EXCEED 1 DOSE PER 24   triamcinolone cream 0.1 % Commonly known as: KENALOG Apply 1 application topically 2 (two) times daily as needed (as directed).         ROS:  A comprehensive review of systems was negative except for: Cardiovascular: positive for HTN Gastrointestinal: positive for reflux symptoms Genitourinary: positive for frequency Integument/breast: positive for rash around his incision on his abdomen at times steroid cream used  Musculoskeletal: positive for back pain, neck pain, and joint pain Neurological: positive for numbness   Blood pressure 130/88, pulse 69, temperature 97.8 F (36.6 C), temperature source Oral, resp. rate 14, height 6' (1.829 m), weight 180 lb (81.6 kg), SpO2 98%. Physical Exam Vitals reviewed.  HENT:     Head: Normocephalic.     Nose: Nose normal.     Mouth/Throat:     Mouth: Mucous membranes are moist.  Eyes:     Extraocular Movements: Extraocular movements intact.  Cardiovascular:     Rate and Rhythm: Normal rate and regular rhythm.  Pulmonary:     Effort: Pulmonary effort is normal.     Breath sounds: Normal breath sounds.  Abdominal:     General: There is no distension.     Palpations: Abdomen is soft.     Tenderness: There is no abdominal tenderness.     Comments: Multiple incisions from prior removal of lipoma, right upper abdomen 2cm lipoma   Musculoskeletal:     Cervical back: Normal range of motion.     Comments: Left knee 1cm superficial lesion over knee, right upper back 2cm lipoma like lesion, left lower back 2cm lipoma like lesion, right forearm medial 1.5-2cm  lesion  Skin:    General: Skin is warm.  Neurological:     General: No focal deficit present.     Mental Status: He is alert and oriented to person, place, and time.  Psychiatric:        Mood and Affect: Mood normal.     Results: None  Assessment & Plan:  [name] DIFIORE is a 72 y.o. male with multiple lipomas on his body that he wants removed. He wants to do this under local and wants to start with the knee and the forearm. We discussed risk of bleeding, infection, recurrence, having sutures we remove given his issues with the sutures from the [place] as it looks like the used monocryl based on his description and now he is having the rash.  -Try pepcid to see if that helps with the rash around those prior incision, steroid cream as needed  - Will excise in the office in November at patient's request - Call with changes   Future Appointments  Date Time Provider Department Center  07/26/2023  1:30 PM [facility]  08/27/2023  9:30 AM Runell Gess, MD [facility] None  10/10/2023  9:00 AM Pricilla Riffle, MD [facility]  11/21/2023  3:00 PM Lucretia Roers, MD [facility] None    All questions were answered to the satisfaction of the patient.   Lucretia Roers 07/25/2023, 1:45 PM

## 2023-07-26 ENCOUNTER — Ambulatory Visit: Payer: Medicare Other | Attending: Internal Medicine

## 2023-07-26 DIAGNOSIS — E875 Hyperkalemia: Secondary | ICD-10-CM | POA: Diagnosis not present

## 2023-07-26 DIAGNOSIS — Z79899 Other long term (current) drug therapy: Secondary | ICD-10-CM | POA: Diagnosis not present

## 2023-07-29 ENCOUNTER — Telehealth: Payer: Self-pay

## 2023-07-29 DIAGNOSIS — I10 Essential (primary) hypertension: Secondary | ICD-10-CM

## 2023-07-29 DIAGNOSIS — Z79899 Other long term (current) drug therapy: Secondary | ICD-10-CM

## 2023-07-29 NOTE — Telephone Encounter (Signed)
Spoke with patient to review labs and recommendations from Dr Tenny Craw. Patient will decrease hydrochlorothiazide to 12.5 mg daily and come in on 8/6 for a BMET. He will also monitor his BP daily and let us know know his BP is running. Patient verbalized understanding if instructions and had no questions.

## 2023-07-29 NOTE — Telephone Encounter (Signed)
-----   Message from [place] sent at 07/27/2023  5:28 AM EDT ----- Potassium is normal  3.5 Sodium is a little low   Cut hydrochlorothiazide down to 1x per day Follow BP athome    Repeat BMET in 10 days

## 2023-08-06 ENCOUNTER — Ambulatory Visit: Payer: Medicare Other | Attending: Cardiology

## 2023-08-06 DIAGNOSIS — I10 Essential (primary) hypertension: Secondary | ICD-10-CM | POA: Diagnosis not present

## 2023-08-06 DIAGNOSIS — Z79899 Other long term (current) drug therapy: Secondary | ICD-10-CM

## 2023-08-07 NOTE — Telephone Encounter (Signed)
Dont need to keep checking BP before meds  Check after   Goal 120s/

## 2023-08-27 ENCOUNTER — Encounter: Payer: Self-pay | Admitting: Cardiovascular Disease

## 2023-08-27 ENCOUNTER — Ambulatory Visit: Payer: Medicare Other | Attending: Cardiovascular Disease | Admitting: Cardiovascular Disease

## 2023-08-27 VITALS — BP 164/94 | HR 62 | Ht 72.0 in | Wt 177.4 lb

## 2023-08-27 DIAGNOSIS — I739 Peripheral vascular disease, unspecified: Secondary | ICD-10-CM | POA: Insufficient documentation

## 2023-08-27 DIAGNOSIS — I2510 Atherosclerotic heart disease of native coronary artery without angina pectoris: Secondary | ICD-10-CM | POA: Insufficient documentation

## 2023-08-27 NOTE — Patient Instructions (Signed)
Medication Instructions:  Your physician recommends that you continue on your current medications as directed. Please refer to the Current Medication list given to you today.  *If you need a refill on your cardiac medications before your next appointment, please call your pharmacy*   Testing/Procedures: Your physician has requested that you have a lower extremity arterial duplex. During this test, ultrasound is used to evaluate arterial blood flow in the legs. Allow one hour for this exam. There are no restrictions or special instructions. This will take place at [place], [address]. To do in March 2025.  Your physician has requested that you have an ankle brachial index (ABI). During this test an ultrasound and blood pressure cuff are used to evaluate the arteries that supply the arms and legs with blood. Allow thirty minutes for this exam. There are no restrictions or special instructions. This will take place at [place], [address].  To do in March 2025.     Follow-Up: At [facility], you and your health needs are our priority.  As part of our continuing mission to provide you with exceptional heart care, we have created designated Provider Care Teams.  These Care Teams include your primary Cardiologist (physician) and Advanced Practice Providers (APPs -  Physician Assistants and Nurse Practitioners) who all work together to provide you with the care you need, when you need it.  We recommend signing up for the patient portal called "MyChart".  Sign up information is provided on this After Visit Summary.  MyChart is used to connect with patients for Virtual Visits (Telemedicine).  Patients are able to view lab/test results, encounter notes, upcoming appointments, etc.  Non-urgent messages can be sent to your provider as well.   To learn more about what you can do with MyChart, go to [email or web address].    Your next appointment:   12 month(s)  Provider:    Quay Burow, MD

## 2023-08-27 NOTE — Progress Notes (Signed)
Mr. Falke returns today for follow-up of PAD.  He still complains of some mild left greater than right calf claudication when he walks although his Dopplers performed 03/12/2023 showed essentially normal ABIs with a mild narrowing in his left popliteal artery.  Will continue to follow this noninvasively on annual basis.  Runell Gess, M.D., FACP, [facility], [facility] [address] [place], [place]  [address]  [phone] 08/27/2023 10:02 AM

## 2023-09-09 ENCOUNTER — Other Ambulatory Visit: Payer: Self-pay | Admitting: Medical Genetics

## 2023-09-09 DIAGNOSIS — Z006 Encounter for examination for normal comparison and control in clinical research program: Secondary | ICD-10-CM

## 2023-09-13 DIAGNOSIS — Z6825 Body mass index (BMI) 25.0-25.9, adult: Secondary | ICD-10-CM | POA: Diagnosis not present

## 2023-09-13 DIAGNOSIS — E782 Mixed hyperlipidemia: Secondary | ICD-10-CM | POA: Diagnosis not present

## 2023-09-13 DIAGNOSIS — E039 Hypothyroidism, unspecified: Secondary | ICD-10-CM | POA: Diagnosis not present

## 2023-09-13 DIAGNOSIS — Z23 Encounter for immunization: Secondary | ICD-10-CM | POA: Diagnosis not present

## 2023-09-13 DIAGNOSIS — I10 Essential (primary) hypertension: Secondary | ICD-10-CM | POA: Diagnosis not present

## 2023-09-13 DIAGNOSIS — Z1331 Encounter for screening for depression: Secondary | ICD-10-CM | POA: Diagnosis not present

## 2023-09-13 DIAGNOSIS — Z0001 Encounter for general adult medical examination with abnormal findings: Secondary | ICD-10-CM | POA: Diagnosis not present

## 2023-09-13 DIAGNOSIS — G729 Myopathy, unspecified: Secondary | ICD-10-CM | POA: Diagnosis not present

## 2023-09-13 DIAGNOSIS — F419 Anxiety disorder, unspecified: Secondary | ICD-10-CM | POA: Diagnosis not present

## 2023-09-13 DIAGNOSIS — E663 Overweight: Secondary | ICD-10-CM | POA: Diagnosis not present

## 2023-09-13 DIAGNOSIS — I2510 Atherosclerotic heart disease of native coronary artery without angina pectoris: Secondary | ICD-10-CM | POA: Diagnosis not present

## 2023-09-25 ENCOUNTER — Ambulatory Visit: Payer: Medicare Other | Admitting: Nurse Practitioner

## 2023-10-02 ENCOUNTER — Telehealth: Payer: Self-pay | Admitting: Allergy

## 2023-10-02 NOTE — Telephone Encounter (Signed)
Patient's authorization, [id number] expires 10-23-2023. Called patient to see if he would like a renewal sent to the [facility]. Patient's last appointment was in Oct 2023 and has not been seen since.   Patient respectfully declined renewal at this moment. He is seeing Dr. Benay Pike (the allergist at the [facility]) at the moment, he has an appointment with her sometime this month and would like to speak to her first before requesting a renewal.

## 2023-10-09 NOTE — Progress Notes (Unsigned)
Cardiology Office Note   Date:  10/09/2023   ID:  [name] [name] Jul 09, 1951, [name]  PCP:  Assunta Found, MD  Cardiologist:   Dietrich Pates, MD   F/U CAD      History of Present Illness: [name] [name] is a 72 y.o. male with a history ofCAD, HTN, PV disease, and HLD seen for numbness and tingling.    History of CAD s/p NSTEMI in 2008 tx with PCI of bifurcational lesion in LAD/Dx with 3 DES. He has a hx of rash with Plavix and Ticlid.  He underwent Plavix desensitization in 2009. Intolerance to Statin and Repatha (caused nightmares). Low risk stress test 09/2016.   The pt had limited episode of exertional CP in AUg/2020.  SHort lived  F/U stress test low risk  No ischemia   Breakfast:  Oatmeal, blueberries or banana Lunch   [employer] or [employer] and  Sara [place]  Meat/vegg  Patient says 1/2 gallon sweet tea will last him 4 to 5 days  I saw the pt in Jan  2023    He denies CP  But he says that when he walks up hills he gets SOB.  He did not have this before     He also notes that he has some burning in his calves when walking  Relieved with rest.    Pt says he is putting on a little weight    new       I saw the pt in clinic in Feb 2024   He has been seen by Erlene Quan since    No outpatient medications have been marked as taking for the 10/10/23 encounter (Appointment) with Pricilla Riffle, MD.     Allergies:   Atorvastatin, Bee venom, Clopidogrel bisulfate, Rosuvastatin, Penicillins, Contrast media [iodinated contrast media], Flomax [tamsulosin], Gadolinium, Neomycin, Viagra [sildenafil], Clopidogrel, Omeprazole, Repatha [evolocumab], and Tape   Past Medical History:  Diagnosis Date   Allergy    Anxiety    Arthritis    back pain   Avascular necrosis of bone of hip (HCC)    right   CAD (coronary artery disease)    stents x3 2008   GERD (gastroesophageal reflux disease)    History of nuclear stress test    a. Myoview 10/17: EF 56%, diaphragmatic  attenuation, no ischemia, low risk   HTN (hypertension)    Hyperlipidemia    Myocardial infarction (HCC)    2008- stents x 3    Paresthesias    Sleep apnea    no cpap    Thyroid disease     Past Surgical History:  Procedure Laterality Date   CARDIAC CATHETERIZATION     3 stents placed   COLONOSCOPY  2011 NUR [facility] SCREENING d50 v5   MILD [place] TICS, ? PREP ARTIFACT V. PROCTITIS-rX:[place]. next TCS 2021.   ESOPHAGEAL DILATION N/A 03/02/2016   Procedure: ESOPHAGEAL DILATION;  Surgeon: West Bali, MD;  [facility];  Service: Endoscopy;  Laterality: N/A;   ESOPHAGOGASTRODUODENOSCOPY  10/03/11   small hiatal hernia/gastric ulcers/stricutre in distal esophagus   ESOPHAGOGASTRODUODENOSCOPY N/A 03/02/2016   Dr. Darrick Penna: Schatzki's ring at GE junction s/p dilation, mild non-erosive gastritis/duodenitis    ESOPHAGOGASTRODUODENOSCOPY (EGD) WITH PROPOFOL N/A 02/09/2020   Dr. Darrick Penna: Low-grade narrowing Schatzki ring status post dilation, moderate gastritis, no biopsies taken   MASS EXCISION Left 09/30/2014   Procedure: EXCISION MASS LEFT WRIST ;  Surgeon: Cindee Salt, MD;  Location: [facility];  Service: Orthopedics;  Laterality: Left;   ROTATOR CUFF REPAIR Left    [facility]    SAVORY DILATION N/A 02/09/2020   Procedure: SAVORY DILATION;  Surgeon: West Bali, MD;  [facility];  Service: Endoscopy;  Laterality: N/A;   TONSILLECTOMY  age 9   TOTAL HIP ARTHROPLASTY  age 22   right hip for avascular necrosis of hip and spine   TRIGGER FINGER RELEASE Left 03/08/2020   Procedure: RELEASE TRIGGER FINGER/A-1 PULLEY LEFT INDEX FINGER;  Surgeon: Cindee Salt, MD;  Location: [facility];  Service: Orthopedics;  Laterality: Left;  IV REGIONAL FOREARM BLOCK   UPPER GASTROINTESTINAL ENDOSCOPY     with dilation - last 02-2020      Social History:  The patient  reports that he quit smoking about 16 years ago. His smoking use included cigarettes. He started  smoking about 46 years ago. He has a 30 pack-year smoking history. He has never used smokeless tobacco. He reports that he does not drink alcohol and does not use drugs.   Family History:  The patient's family history includes Dementia in his mother; Lung cancer (age of onset: 15) in his sister; Prostate cancer (age of onset: 6) in his father.    ROS:  Please see the history of present illness. All other systems are reviewed and  Negative to the above problem except as noted.    PHYSICAL EXAM: VS:  There were no vitals taken for this visit.  GEN: Well nourished, well developed, in no acute distress  HEENT: normal  Neck: no JVD, carotid bruit  Cardiac: RRR; no murmur  NO LE  edema  Respiratory:  clear to auscultation   GI: soft, nontender, nondistended, + BS  No hepatomegaly  MS: no deformity Moving all extremities   Skin: warm and dry Neuro:  Strength and sensation are intact Psych: euthymic mood, full affect   EKG:  EKG is not ordered today. NSR 74 bpm    Lipid Panel    Component Value Date/Time   CHOL 144 01/04/2022 1059   TRIG 82 01/04/2022 1059   TRIG 107 08/15/2009 1335   HDL 35 (L) 01/04/2022 1059   CHOLHDL 4.1 01/04/2022 1059   CHOLHDL 4.5 01/30/2016 1005   VLDL 24 01/30/2016 1005   LDLCALC 93 01/04/2022 1059   LDLCALC 87 08/15/2009 1335      Wt Readings from Last 3 Encounters:  08/27/23 177 lb 6.4 oz (80.5 kg)  07/25/23 180 lb (81.6 kg)  05/01/23 182 lb (82.6 kg)      ASSESSMENT AND PLAN:  1  CAD  Pt with remote intervention  He is having some dyspnea with walking hills   Will set up for a GXT Myoview to evaluate for ischemia   2  ? Claudication   Symptoms of burning in calves   Set up for LE USN   3   HL   Pt has had problems with meds    Intolerant to Praluent    On pravstatin and Zetia    Lipids at the [place] [facility] in Jan 2024 showed LDL 77 HDL 38   Follow   4  HTN  BP is controlled .  Continue current meds   5  DM   A1C is 6.5    Discussed diet Cut  out sweets        Keep active    F/U next winter    Current medicines are reviewed at length with the  patient today.  The patient does not have concerns regarding medicines.  Signed, Dietrich Pates, MD  10/09/2023 3:11 PM    [facility] [address] [place], [place], [place]  [address] Phone: [phone]; Fax: [phone]

## 2023-10-10 ENCOUNTER — Encounter: Payer: Self-pay | Admitting: Internal Medicine

## 2023-10-10 ENCOUNTER — Ambulatory Visit: Payer: Medicare Other | Attending: Internal Medicine | Admitting: Internal Medicine

## 2023-10-10 VITALS — BP 118/80 | HR 60 | Ht 72.0 in | Wt 181.4 lb

## 2023-10-10 DIAGNOSIS — I2510 Atherosclerotic heart disease of native coronary artery without angina pectoris: Secondary | ICD-10-CM | POA: Insufficient documentation

## 2023-10-10 DIAGNOSIS — I10 Essential (primary) hypertension: Secondary | ICD-10-CM | POA: Insufficient documentation

## 2023-10-10 DIAGNOSIS — I739 Peripheral vascular disease, unspecified: Secondary | ICD-10-CM | POA: Diagnosis not present

## 2023-10-10 DIAGNOSIS — Z79899 Other long term (current) drug therapy: Secondary | ICD-10-CM | POA: Insufficient documentation

## 2023-10-10 DIAGNOSIS — R0600 Dyspnea, unspecified: Secondary | ICD-10-CM | POA: Insufficient documentation

## 2023-10-10 NOTE — Patient Instructions (Signed)
Medication Instructions:   *If you need a refill on your cardiac medications before your next appointment, please call your pharmacy*   Lab Work: NMR, APO B, LIPO A, HGBA1C, BMET If you have labs (blood work) drawn today and your tests are completely normal, you will receive your results only by: MyChart Message (if you have MyChart) OR A paper copy in the mail If you have any lab test that is abnormal or we need to change your treatment, we will call you to review the results.   Testing/Procedures:    Follow-Up: At [facility], you and your health needs are our priority.  As part of our continuing mission to provide you with exceptional heart care, we have created designated Provider Care Teams.  These Care Teams include your primary Cardiologist (physician) and Advanced Practice Providers (APPs -  Physician Assistants and Nurse Practitioners) who all work together to provide you with the care you need, when you need it.  We recommend signing up for the patient portal called "MyChart".  Sign up information is provided on this After Visit Summary.  MyChart is used to connect with patients for Virtual Visits (Telemedicine).  Patients are able to view lab/test results, encounter notes, upcoming appointments, etc.  Non-urgent messages can be sent to your provider as well.   To learn more about what you can do with MyChart, go to [email or web address].    Your next appointment:   8 month(s)  Provider:   Dietrich Pates, MD     Other Instructions

## 2023-10-11 LAB — BASIC METABOLIC PANEL
BUN/Creatinine Ratio: 15 (ref 10–24)
BUN: 14 mg/dL (ref 8–27)
CO2: 28 mmol/L (ref 20–29)
Calcium: 9.8 mg/dL (ref 8.6–10.2)
Chloride: 98 mmol/L (ref 96–106)
Creatinine, Ser: 0.92 mg/dL (ref 0.76–1.27)
Glucose: 106 mg/dL — ABNORMAL HIGH (ref 70–99)
Potassium: 4.5 mmol/L (ref 3.5–5.2)
Sodium: 138 mmol/L (ref 134–144)
eGFR: 88 mL/min/{1.73_m2} (ref >59)

## 2023-10-11 LAB — HEMOGLOBIN A1C
Est. average glucose Bld gHb Est-mCnc: 143 mg/dL
Hgb A1c MFr Bld: 6.6 % — ABNORMAL HIGH (ref 4.8–5.6)

## 2023-10-11 LAB — NMR, LIPOPROFILE
Cholesterol, Total: 165 mg/dL (ref 100–199)
HDL Particle Number: 31.6 umol/L (ref >=30.5)
HDL-C: 42 mg/dL (ref >39)
LDL Particle Number: 1441 nmol/L — ABNORMAL HIGH (ref <1000)
LDL Size: 20.5 nmol — ABNORMAL LOW (ref >20.5)
LDL-C (NIH Calc): 102 mg/dL — ABNORMAL HIGH (ref 0–99)
LP-IR Score: 71 — ABNORMAL HIGH (ref <=45)
Small LDL Particle Number: 925 nmol/L — ABNORMAL HIGH (ref <=527)
Triglycerides: 113 mg/dL (ref 0–149)

## 2023-10-11 LAB — APOLIPOPROTEIN B: Apolipoprotein B: 87 mg/dL (ref <90)

## 2023-10-11 LAB — LIPOPROTEIN A (LPA): Lipoprotein (a): 111.9 nmol/L — ABNORMAL HIGH (ref <75.0)

## 2023-10-14 ENCOUNTER — Telehealth: Payer: Self-pay

## 2023-10-14 DIAGNOSIS — I2510 Atherosclerotic heart disease of native coronary artery without angina pectoris: Secondary | ICD-10-CM

## 2023-10-14 DIAGNOSIS — E785 Hyperlipidemia, unspecified: Secondary | ICD-10-CM

## 2023-10-14 DIAGNOSIS — I739 Peripheral vascular disease, unspecified: Secondary | ICD-10-CM

## 2023-10-14 DIAGNOSIS — Z79899 Other long term (current) drug therapy: Secondary | ICD-10-CM

## 2023-10-14 NOTE — Telephone Encounter (Signed)
I called the pt about his labs results and Dr Tenny Craw' recommendation for [facility] but the pt says he has tried that in the past... he says he developed Bronchitis which he read is a side effect so he is reluctant to try it again and it was too expensive... he is asking for a twice a year injectable and any other options... he would like to have a lipid clinic referral to talk about his options re: cost and side effects... referral placed.

## 2023-10-14 NOTE — Telephone Encounter (Signed)
-----   Message from [place] sent at 10/11/2023 10:04 PM EDT ----- LDL is 102    Higher particle number    Lpa is very high    Given this and CAD should be lower     He is concerned about drug reaction   Would try Nexletol (a pill)    Follow up lipomed and liver panel in 8 wks

## 2023-10-22 ENCOUNTER — Ambulatory Visit: Payer: Medicare Other | Attending: Internal Medicine | Admitting: Student

## 2023-10-22 ENCOUNTER — Encounter: Payer: Self-pay | Admitting: Student

## 2023-10-22 DIAGNOSIS — E785 Hyperlipidemia, unspecified: Secondary | ICD-10-CM | POA: Diagnosis not present

## 2023-10-22 NOTE — Progress Notes (Signed)
Patient ID: MINTON RAVENSCROFT                 DOB: 1951-05-20                    MRN: [id number]      HPI: [name] [name] is a 72 y.o. male patient referred to lipid clinic by Dr.Ross. PMH is significant for CAD, HTN, PV disease, and HLD ,PAD   Intolerance to Statin and Repatha and Praluent (caused nightmares). Patient presented today for lipid clinic. Reports he felt weird when he was on Repatha and Praluent. Felt like his mind was constantly occupied and he was was having very vivid dreams. When he stopped taking them the symptoms were gone. He also has tried Nexletol, unable to tolerate it due to severe bronchitis. Patient eats healthy home cooked meals but it is hard and not economical to cook  just for himself so eats out too. However pick healthy food option when eats out. Own 4 acres of the land so stays busy maintain it however no exercise focused time. Reviewed inclisiran.  Discussed mechanisms of action, dosing, side effects and potential decreases in LDL cholesterol.  Also reviewed cost information and potential options for patient assistance.    Current Medications: Zetia 10 mg daily and pravastatin 20 mg twice daily  Intolerances: Repatha, Praluent - trouble sleeping, rosuvastatin and atorvastatin - muscle aches nexletol - bronchitis   Risk Factors: CAD s/p NSTEMI in 2008 tx with PCI,HLD, HTN, T2DM LDL goal: <55 mg/dl  non-HDL-C <56 EPPI<95 TG<150  Last lab : LDLc 102, TG 113, HDL 42, TC 165 ApoB 87, Lp(a) 111.9  Diet: eats healthy most of the time either at home or out    Exercise: walk 1-2 miles per day, loves to work outside, own 4 acres stays bust maintaining it   Family History:  Relation Problem Comments  Mother (Alive) Dementia, ASCVD got bypass at age 86      Father (Deceased) Prostate cancer (Age: 10) stroke at age 62 , HLD     Sister (Deceased) Brother (deceased  HLD on statin  Cancer and ASCVD       Social History:  Alcohol: none  Smoking: quit 2008    Labs: Lipid Panel     Component Value Date/Time   CHOL 144 01/04/2022 1059   TRIG 82 01/04/2022 1059   TRIG 107 08/15/2009 1335   HDL 35 (L) 01/04/2022 1059   CHOLHDL 4.1 01/04/2022 1059   CHOLHDL 4.5 01/30/2016 1005   VLDL 24 01/30/2016 1005   LDLCALC 93 01/04/2022 1059   LDLCALC 87 08/15/2009 1335   LABVLDL 16 01/04/2022 1059    Past Medical History:  Diagnosis Date   Allergy    Anxiety    Arthritis    back pain   Avascular necrosis of bone of hip (HCC)    right   CAD (coronary artery disease)    stents x3 2008   GERD (gastroesophageal reflux disease)    History of nuclear stress test    a. Myoview 10/17: EF 56%, diaphragmatic attenuation, no ischemia, low risk   HTN (hypertension)    Hyperlipidemia    Myocardial infarction [facility])    2008- stents x 3    Paresthesias    Sleep apnea    no cpap    Thyroid disease     Current Outpatient Medications on File Prior to Visit  Medication Sig Dispense Refill   acetaminophen (TYLENOL) 325 MG tablet  Take 650 mg by mouth daily as needed (pain).     aspirin EC 81 MG tablet Take 1 tablet (81 mg total) by mouth daily. 90 tablet 3   calcium carbonate (TUMS EX) 750 MG chewable tablet Chew 1 tablet by mouth 2 (two) times daily as needed for heartburn.      carvedilol (COREG) 6.25 MG tablet Take 0.5 tablets by mouth in the morning and at bedtime.     desloratadine (CLARINEX) 5 MG tablet Take 5 mg by mouth daily.     EPINEPHrine 0.3 mg/0.3 mL IJ SOAJ injection Inject 0.3 mg into the muscle as needed for anaphylaxis. 1 each 0   ezetimibe (ZETIA) 10 MG tablet Take 0.5 tablets (5 mg total) by mouth daily.     famotidine (PEPCID) 20 MG tablet Take 1 tablet (20 mg total) by mouth 2 (two) times daily. 180 tablet 3   hydrochlorothiazide (HYDRODIURIL) 25 MG tablet Take 12.5 mg by mouth 2 (two) times daily.     levothyroxine (SYNTHROID, LEVOTHROID) 75 MCG tablet Take 75 mcg by mouth daily before breakfast.     losartan (COZAAR) 100 MG  tablet Take 50 mg by mouth 2 (two) times daily.     omeprazole (PRILOSEC) 20 MG capsule Take 20 mg by mouth daily.     pravastatin (PRAVACHOL) 40 MG tablet Take 20 mg by mouth 2 (two) times daily.     pregabalin (LYRICA) 25 MG capsule Take 25 mg by mouth daily.     tadalafil (CIALIS) 20 MG tablet TAKE ONE TABLET BY MOUTH AS INSTRUCTED AS NEEDED (TAKE 1 HOUR PRIOR TO SEXUAL ACTIVITY *DO NOT EXCEED 1 DOSE PER 24     triamcinolone cream (KENALOG) 0.1 % Apply 1 application topically 2 (two) times daily as needed (as directed).     [DISCONTINUED] metoprolol tartrate (LOPRESSOR) 25 MG tablet Take 1 tablet (25 mg total) by mouth 2 (two) times daily as needed (palpitations). 30 tablet 0   No current facility-administered medications on file prior to visit.    Allergies  Allergen Reactions   Atorvastatin Other (See Comments)    Muscle aches   Bee Venom Anaphylaxis, Hives and Swelling   Clopidogrel Bisulfate Hives   Rosuvastatin Other (See Comments)    Muscle aches   Nexletol [Bempedoic Acid]     URI    Penicillins     Pt unsure what type allergy/ was a child when had reaction Did it involve swelling of the face/tongue/throat, SOB, or low BP? Unknown Did it involve sudden or severe rash/hives, skin peeling, or any reaction on the inside of your mouth or nose? Unknown Did you need to seek medical attention at a hospital or doctor's office? Unknown When did it last happen?       If all above answers are "NO", may proceed with cephalosporin use.    Contrast Media [Iodinated Contrast Media] Hives   Flomax [Tamsulosin]    Gadolinium    Neomycin    Viagra [Sildenafil]    Clopidogrel Rash and Hives   Omeprazole Palpitations   Repatha [Evolocumab] Anxiety    Trouble sleeping   Tape Rash    Tears skin Also ,EKG adhesive pads causes redness, rash and burning sensation at site     Assessment/Plan:  1. Hyperlipidemia -  Problem  Hyperlipidemia   Qualifier: Diagnosis of  By: Tenny Craw, MD, Blenda Peals Current Medications: Zetia 10 mg daily and pravastatin 20 mg twice daily  Intolerances: Repatha, Praluent - trouble sleeping,  rosuvastatin and atorvastatin - muscle aches nexletol - bronchitis   Risk Factors: CAD s/p NSTEMI in 2008 tx with PCI,HLD, HTN, T2DM LDL goal: <55 mg/dl  non-HDL-C <04 VWUJ<81 TG<150  Last lab : LDLc 102, TG 113, HDL 42, TC 165 ApoB 87, Lp(a) 111.9      Hyperlipidemia Assessment/Plan:  LDL goal: < 55 mg/dl last LDLc 191 mg/dl (47/8295) ApoB 87, Lp(a) 111.9  Tolerates Zetia and moderate intensity statins well without any side effects  Intolerance to high intensity statins, Repatha, Praluent and Nexletol  Discussed next potential options - inclisiran); cost, dosing efficacy, side effects  Will submit coverage assessment form and with his supplemental plan anticipating lower cost for Leqvio  Will inform patient once we get cost assessment back from [place] support center       Thank you,  Carmela Hurt, Pharm.D [facility] A Division of [facility] [address] [place], [place] [address]  Phone: [phone]; Fax: [phone]

## 2023-10-22 NOTE — Assessment & Plan Note (Signed)
Assessment/Plan:  LDL goal: < 55 mg/dl last LDLc 161 mg/dl (08/6044) ApoB 87, Lp(a) 111.9  Tolerates Zetia and moderate intensity statins well without any side effects  Intolerance to high intensity statins, Repatha, Praluent and Nexletol  Discussed next potential options - inclisiran); cost, dosing efficacy, side effects  Will submit coverage assessment form and with his supplemental plan anticipating lower cost for Leqvio  Will inform patient once we get cost assessment back from [facility] support center

## 2023-10-29 DIAGNOSIS — M79632 Pain in left forearm: Secondary | ICD-10-CM | POA: Diagnosis not present

## 2023-10-29 DIAGNOSIS — M25522 Pain in left elbow: Secondary | ICD-10-CM | POA: Diagnosis not present

## 2023-10-30 ENCOUNTER — Other Ambulatory Visit: Payer: Self-pay | Admitting: Pharmacist

## 2023-10-30 ENCOUNTER — Telehealth: Payer: Self-pay | Admitting: Pharmacist

## 2023-10-30 DIAGNOSIS — E785 Hyperlipidemia, unspecified: Secondary | ICD-10-CM

## 2023-10-30 NOTE — Telephone Encounter (Signed)
Called N/A LVM. Called to inform about Leqvio cost assessment result - patient does not pay anything out of pocket. Leqvio order sent to [facility]

## 2023-10-30 NOTE — Progress Notes (Signed)
Error

## 2023-11-04 ENCOUNTER — Other Ambulatory Visit (HOSPITAL_COMMUNITY)
Admission: RE | Admit: 2023-11-04 | Discharge: 2023-11-04 | Disposition: A | Discharge status: Home / Self Care | Payer: Medicare Other | Source: Ambulatory Visit | Attending: Medical Genetics | Admitting: Medical Genetics

## 2023-11-04 DIAGNOSIS — Z006 Encounter for examination for normal comparison and control in clinical research program: Secondary | ICD-10-CM | POA: Insufficient documentation

## 2023-11-05 NOTE — Telephone Encounter (Signed)
Susa Day,  Just wanted to f/u. We have not received the referral yet. Please enter the treatment plan and will get him scheduled as soon as possible.  Selena Batten

## 2023-11-06 ENCOUNTER — Other Ambulatory Visit: Payer: Self-pay | Admitting: Internal Medicine

## 2023-11-06 ENCOUNTER — Other Ambulatory Visit: Payer: Self-pay | Admitting: Pharmacist

## 2023-11-06 NOTE — Progress Notes (Signed)
Error

## 2023-11-13 LAB — HELIX MOLECULAR SCREEN: Genetic Analysis Overall Interpretation: NEGATIVE

## 2023-11-13 LAB — GENECONNECT MOLECULAR SCREEN

## 2023-11-20 DIAGNOSIS — M25522 Pain in left elbow: Secondary | ICD-10-CM | POA: Diagnosis not present

## 2023-11-21 ENCOUNTER — Ambulatory Visit (INDEPENDENT_AMBULATORY_CARE_PROVIDER_SITE_OTHER): Payer: Medicare Other | Admitting: General Surgery

## 2023-11-21 ENCOUNTER — Encounter: Payer: Self-pay | Admitting: General Surgery

## 2023-11-21 VITALS — BP 121/85 | HR 61 | Temp 98.1°F | Resp 14 | Ht 72.0 in | Wt 182.0 lb

## 2023-11-21 DIAGNOSIS — E854 Organ-limited amyloidosis: Secondary | ICD-10-CM | POA: Diagnosis not present

## 2023-11-21 DIAGNOSIS — R2231 Localized swelling, mass and lump, right upper limb: Secondary | ICD-10-CM | POA: Diagnosis not present

## 2023-11-21 DIAGNOSIS — D1721 Benign lipomatous neoplasm of skin and subcutaneous tissue of right arm: Secondary | ICD-10-CM | POA: Diagnosis not present

## 2023-11-21 DIAGNOSIS — D1724 Benign lipomatous neoplasm of skin and subcutaneous tissue of left leg: Secondary | ICD-10-CM | POA: Diagnosis not present

## 2023-11-21 DIAGNOSIS — D2372 Other benign neoplasm of skin of left lower limb, including hip: Secondary | ICD-10-CM | POA: Diagnosis not present

## 2023-11-21 DIAGNOSIS — L99 Other disorders of skin and subcutaneous tissue in diseases classified elsewhere: Secondary | ICD-10-CM | POA: Diagnosis not present

## 2023-11-21 NOTE — Patient Instructions (Signed)
Leave bandage on for 48 hours. Can bird bath between now and then. After 48 hours can replace bandage and neosporin for the next 5 days, after that can cover or leave open. Can shower per regular routine after 48 hours Tylenol or ibuprofen for pain Call with issues or concerns.

## 2023-11-21 NOTE — Progress Notes (Signed)
[facility] Procedure Note  11/21/23  Pre-procedure Diagnosis:  Lipoma of left knee, lipoma of right forearm, mass of right wrist    Post-procedure Diagnosis: Same   Procedure(s) Performed: Excision lipoma left knee 1cm,  excision of lipoma right forearm 2cm, excision right wrist mass <0.5cm   Surgeon: Leatrice Jewels. Henreitta Leber, MD   Assistants: No qualified resident was available    Anesthesia: Lidocaine 1%    Specimens: Lipoma of left knee, lipoma of right forearm, mass of right wrist    Estimated Blood Loss: Minimal  Wound Class: Clean    Procedure Indications: [name] [name] is a 72 yo with multiple lipomas who wants some removed in the office under local. Discussed excision, risk of bleeding, infection, recurrence, finding something unexpected.   Findings: Lipoma of left knee, lipoma of right forearm, unknown subcutaneous mass right wrist    Procedure: The patient was taken to the procedure room and placed sitting. The left knee was prepared and draped in the usual sterile fashion with Hibiclens due to his allergies. Lidocaine 1% was injected. An incision was made and carried down into the superficial subcutaneous tissue. A 1cm lipomatous lesion was eviscerated and sent to pathology. The cavity was made hemostatic and closed with 3-0 Nylon suture. Neosporin and a bandage was placed.   Attention was then turned to his right volar surface of the forearm. On the ulnar side there was a 2cm lipoma. This again was prepped and draped and lidocaine was injected.  An incision was made and carried down, a 2cm lipoma was eviscerated this was superficial to any muscle. The deep cavity was made hemostatic. A 3-0 Vicryl closed the deeper space and 3-0 Nylon closed the skin. Neosporin and a bandage was placed.   Attention was then turned to this mass on the right radial wrist volar surface. The area was prepped and draped and lidocaine was injected. A small 0.25-0.5cm mass was removed  from the subcutaneous tissue/ skin. The area was made hemostatic and closed with 3-0 Nylon suture. Neosporin and a bandage was placed.   Final inspection revealed acceptable hemostasis. The patient tolerated the procedure well.   Future Appointments  Date Time Provider Department Center  11/25/2023 11:00 AM [facility] 1 [facility] None  12/04/2023  3:00 PM Lucretia Roers, MD [facility] None  03/05/2024 10:00 AM [facility] NL VASC 2 [facility]   Leave bandage on for 48 hours. Can bird bath between now and then. After 48 hours can replace bandage and neosporin for the next 5 days, after that can cover or leave open. Can shower per regular routine after 48 hours Tylenol or ibuprofen for pain Call with issues or concerns.   [name] Greenhouse, MD [facility] [address] [place], [place] [address] [phone] (office)

## 2023-11-22 NOTE — Addendum Note (Signed)
Addended by: Legrand Rams B on: 11/22/2023 08:51 AM   Modules accepted: Orders

## 2023-11-25 ENCOUNTER — Ambulatory Visit: Payer: Medicare Other | Admitting: *Deleted

## 2023-11-25 VITALS — BP 149/90 | HR 59 | Temp 97.9°F | Resp 14 | Ht 72.0 in | Wt 183.2 lb

## 2023-11-25 DIAGNOSIS — E7849 Other hyperlipidemia: Secondary | ICD-10-CM

## 2023-11-25 DIAGNOSIS — I2510 Atherosclerotic heart disease of native coronary artery without angina pectoris: Secondary | ICD-10-CM | POA: Diagnosis not present

## 2023-11-25 MED ORDER — INCLISIRAN SODIUM 284 MG/1.5ML ~~LOC~~ SOSY
284.0000 mg | PREFILLED_SYRINGE | Freq: Once | SUBCUTANEOUS | Status: AC
Start: 2023-11-25 — End: 2023-11-25
  Administered 2023-11-25: 284 mg via SUBCUTANEOUS
  Filled 2023-11-25: qty 1.5

## 2023-11-25 MED ORDER — SODIUM CHLORIDE 0.9% FLUSH
10.0000 mL | Freq: Two times a day (BID) | INTRAVENOUS | Status: DC
Start: 2023-11-25 — End: 2023-11-25

## 2023-11-25 NOTE — Progress Notes (Signed)
Diagnosis: Hyperlipidemia  Provider:  Chilton Greathouse MD  Procedure: Injection  Leqvio (inclisiran), Dose: 284 mg, Site: subcutaneous, Number of injections: 1  Post Care: Observation period completed  Discharge: Condition: Good, Destination: Home . AVS Provided  Performed by:  Forrest Moron, RN

## 2023-11-27 DIAGNOSIS — B9689 Other specified bacterial agents as the cause of diseases classified elsewhere: Secondary | ICD-10-CM | POA: Diagnosis not present

## 2023-11-27 DIAGNOSIS — J329 Chronic sinusitis, unspecified: Secondary | ICD-10-CM | POA: Diagnosis not present

## 2023-11-27 DIAGNOSIS — Z1330 Encounter for screening examination for mental health and behavioral disorders, unspecified: Secondary | ICD-10-CM | POA: Diagnosis not present

## 2023-11-27 DIAGNOSIS — R051 Acute cough: Secondary | ICD-10-CM | POA: Diagnosis not present

## 2023-12-02 LAB — TISSUE PATH REPORT 10802

## 2023-12-02 LAB — PATHOLOGY REPORT

## 2023-12-03 DIAGNOSIS — M25522 Pain in left elbow: Secondary | ICD-10-CM | POA: Diagnosis not present

## 2023-12-03 NOTE — Progress Notes (Signed)
Pathologies are all benign. 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  Right forearm- lipoma

## 2023-12-04 ENCOUNTER — Ambulatory Visit (INDEPENDENT_AMBULATORY_CARE_PROVIDER_SITE_OTHER): Payer: Medicare Other | Admitting: General Surgery

## 2023-12-04 ENCOUNTER — Encounter: Payer: Self-pay | Admitting: General Surgery

## 2023-12-04 VITALS — BP 130/82 | HR 63 | Temp 97.7°F | Resp 14 | Ht 72.0 in | Wt 183.0 lb

## 2023-12-04 DIAGNOSIS — D179 Benign lipomatous neoplasm, unspecified: Secondary | ICD-10-CM

## 2023-12-04 DIAGNOSIS — L99 Other disorders of skin and subcutaneous tissue in diseases classified elsewhere: Secondary | ICD-10-CM | POA: Insufficient documentation

## 2023-12-04 DIAGNOSIS — D219 Benign neoplasm of connective and other soft tissue, unspecified: Secondary | ICD-10-CM

## 2023-12-04 DIAGNOSIS — E854 Organ-limited amyloidosis: Secondary | ICD-10-CM

## 2023-12-04 NOTE — Patient Instructions (Addendum)
Amyloidosis on the biopsy- will let your PCP know so they can keep an eye on this or evaluate things further.  Pathologies are all benign.  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 Right forearm- lipoma  Steristrips will peel off in the next 5-7 days. You can remove them once they are peeling off. It is ok to shower. Pat the area dry.

## 2023-12-05 NOTE — Progress Notes (Signed)
[facility]  Doing well. No issues.   BP 130/82   Pulse 63   Temp 97.7 F (36.5 C) (Oral)   Resp 14   Ht 6' (1.829 m)   Wt 183 lb (83 kg)   SpO2 94%   BMI 24.82 kg/m  Suture removed, right wrist, right forearm and left knee, steri strips applied   Patient s/p excision of right wrist, right forearm, and left knee masses.   Amyloidosis on the biopsy- will let your PCP know so they can keep an eye on this or evaluate things further.  Pathologies are all benign.  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 You can remove them once they are peeling off. It is ok to shower. Pat the area dry.   Will let Dr. Tenny Craw, Cardiologist know about the amyloidosis on the skin, just for future reference if any concern for any heart related issues. Will forward my note.   Algis Greenhouse, MD [facility] [address] [place], [place] [address] [phone] (office)

## 2023-12-12 ENCOUNTER — Telehealth: Payer: Self-pay

## 2023-12-12 DIAGNOSIS — I2510 Atherosclerotic heart disease of native coronary artery without angina pectoris: Secondary | ICD-10-CM

## 2023-12-12 NOTE — Telephone Encounter (Signed)
-----   Message from Dietrich Pates sent at 12/12/2023  2:38 PM EST -----    ----- Message ----- From: Lucretia Roers, MD Sent: 12/05/2023  10:56 AM EST To: Pricilla Riffle, MD

## 2023-12-12 NOTE — Progress Notes (Signed)
Set patient up for an echo   Hx of CAD   Hx of nodular amyloid skin

## 2023-12-12 NOTE — Telephone Encounter (Signed)
Pt advised and agrees to having an Echo.

## 2023-12-26 ENCOUNTER — Encounter: Payer: Self-pay | Admitting: Internal Medicine

## 2023-12-26 DIAGNOSIS — E8589 Other amyloidosis: Secondary | ICD-10-CM

## 2023-12-27 NOTE — Telephone Encounter (Signed)
Pt is currently scheduled for echo   Set him up for cardiac MRI

## 2024-01-09 ENCOUNTER — Encounter: Payer: Self-pay | Admitting: Internal Medicine

## 2024-01-09 ENCOUNTER — Telehealth: Payer: Self-pay

## 2024-01-09 NOTE — Telephone Encounter (Signed)
 Auth Submission: NO AUTH NEEDED Site of care: Site of care: [facility] Payer: Medicare A/B with BCBS supplement Medication & CPT/J Code(s) submitted: Leqvio  (Inclisiran) J1306 Route of submission (phone, fax, portal):  Phone # Fax # Auth type: Buy/Bill PB Units/visits requested: 284mg  x 2 doses Reference number:  Approval from: 01/09/24 to 01/30/25   Medicare A/B will cover 80%, BCBS supplement will cover the remaining 20%.

## 2024-01-16 ENCOUNTER — Ambulatory Visit (HOSPITAL_COMMUNITY): Payer: Medicare Other | Attending: Internal Medicine

## 2024-01-16 DIAGNOSIS — I2510 Atherosclerotic heart disease of native coronary artery without angina pectoris: Secondary | ICD-10-CM | POA: Diagnosis not present

## 2024-01-16 LAB — ECHOCARDIOGRAM COMPLETE
Area-P 1/2: 2.74 cm2
S' Lateral: 2.5 cm

## 2024-01-19 ENCOUNTER — Encounter: Payer: Self-pay | Admitting: Internal Medicine

## 2024-02-05 ENCOUNTER — Encounter: Payer: Self-pay | Admitting: Internal Medicine

## 2024-02-05 DIAGNOSIS — U071 COVID-19: Secondary | ICD-10-CM | POA: Diagnosis not present

## 2024-02-11 ENCOUNTER — Encounter: Payer: Self-pay | Admitting: Internal Medicine

## 2024-02-12 ENCOUNTER — Other Ambulatory Visit: Payer: Self-pay

## 2024-02-12 ENCOUNTER — Encounter: Payer: Self-pay | Admitting: Internal Medicine

## 2024-02-12 DIAGNOSIS — Z01810 Encounter for preprocedural cardiovascular examination: Secondary | ICD-10-CM

## 2024-02-12 MED ORDER — PREDNISONE 50 MG PO TABS: ORAL_TABLET | ORAL | 0 refills | Status: DC

## 2024-02-12 MED ORDER — DIPHENHYDRAMINE HCL 50 MG PO TABS: ORAL_TABLET | ORAL | 0 refills | Status: DC

## 2024-02-12 NOTE — Telephone Encounter (Signed)
I spoke with the pt and he verbalized understanding of his Premed instructions for his Gadolinium allergy prior to his MRI this Friday 02/14/24.  He had labs at the [facility] today and CBC in care everywhere.   HGB 14.3 HCT 41.1

## 2024-02-13 ENCOUNTER — Encounter: Payer: Self-pay | Admitting: Internal Medicine

## 2024-02-13 ENCOUNTER — Telehealth (HOSPITAL_COMMUNITY): Payer: Self-pay | Admitting: *Deleted

## 2024-02-13 NOTE — Telephone Encounter (Signed)
Patient returning call upcoming cardiac imaging study; pt verbalizes understanding of appt date/time, parking situation and where to check in, pre-test NPO status and medications ordered, and verified current allergies; name and call back number provided for further questions should they arise  Larey Brick RN Navigator Cardiac Imaging [facility] [phone] office [phone] cell  Reviewed 13 hour prep with patient. He verbalized understanding and denies metal or claustrophobia.

## 2024-02-13 NOTE — Telephone Encounter (Signed)
Attempted to call patient regarding upcoming cardiac MRI appointment. Left message on voicemail with name and callback number Johney Frame RN Navigator Cardiac Imaging [facility] [phone] Office

## 2024-02-14 ENCOUNTER — Other Ambulatory Visit: Payer: Self-pay | Admitting: Internal Medicine

## 2024-02-14 ENCOUNTER — Ambulatory Visit (HOSPITAL_COMMUNITY)
Admission: RE | Admit: 2024-02-14 | Discharge: 2024-02-14 | Disposition: A | Discharge status: Home / Self Care | Payer: Medicare Other | Source: Ambulatory Visit | Attending: Internal Medicine | Admitting: Internal Medicine

## 2024-02-14 DIAGNOSIS — I2510 Atherosclerotic heart disease of native coronary artery without angina pectoris: Secondary | ICD-10-CM | POA: Diagnosis not present

## 2024-02-14 DIAGNOSIS — E8589 Other amyloidosis: Secondary | ICD-10-CM | POA: Insufficient documentation

## 2024-02-14 MED ORDER — GADOBUTROL 1 MMOL/ML IV SOLN
10.0000 mL | Freq: Once | INTRAVENOUS | Status: AC | PRN
  Administered 2024-02-14: 10 mL via INTRAVENOUS

## 2024-02-26 ENCOUNTER — Ambulatory Visit (INDEPENDENT_AMBULATORY_CARE_PROVIDER_SITE_OTHER): Payer: Medicare Other

## 2024-02-26 VITALS — BP 146/90 | HR 62 | Temp 97.8°F | Resp 16 | Ht 72.0 in | Wt 180.6 lb

## 2024-02-26 DIAGNOSIS — E7849 Other hyperlipidemia: Secondary | ICD-10-CM

## 2024-02-26 DIAGNOSIS — I2510 Atherosclerotic heart disease of native coronary artery without angina pectoris: Secondary | ICD-10-CM

## 2024-02-26 MED ORDER — INCLISIRAN SODIUM 284 MG/1.5ML ~~LOC~~ SOSY
284.0000 mg | PREFILLED_SYRINGE | Freq: Once | SUBCUTANEOUS | Status: AC
  Administered 2024-02-26: 284 mg via SUBCUTANEOUS
  Filled 2024-02-26: qty 1.5

## 2024-02-26 NOTE — Progress Notes (Signed)
 Diagnosis: Hyperlipidemia  Provider:  Chilton Greathouse MD  Procedure: Injection  Leqvio (inclisiran), Dose: 284 mg, Site: subcutaneous, Number of injections: 1  Injection Site(s): Right upper quad. Gluteus subcutaneously   Post Care: Patient declined observation  Discharge: Condition: Good, Destination: Home . AVS Provided  Performed by:  Loney Hering, LPN

## 2024-03-05 ENCOUNTER — Ambulatory Visit (HOSPITAL_COMMUNITY)
Admission: RE | Admit: 2024-03-05 | Discharge: 2024-03-05 | Disposition: A | Discharge status: Home / Self Care | Payer: Federal, State, Local not specified - PPO | Source: Ambulatory Visit | Attending: Cardiovascular Disease | Admitting: Cardiovascular Disease

## 2024-03-05 DIAGNOSIS — I739 Peripheral vascular disease, unspecified: Secondary | ICD-10-CM | POA: Diagnosis not present

## 2024-03-05 LAB — VAS US ABI WITH/WO TBI
Left ABI: 0.97
Right ABI: 1.08

## 2024-03-09 ENCOUNTER — Other Ambulatory Visit (HOSPITAL_COMMUNITY): Payer: Self-pay

## 2024-03-09 DIAGNOSIS — I739 Peripheral vascular disease, unspecified: Secondary | ICD-10-CM

## 2024-04-18 ENCOUNTER — Ambulatory Visit
Admission: EM | Admit: 2024-04-18 | Discharge: 2024-04-18 | Disposition: A | Discharge status: Home / Self Care | Attending: Family Medicine | Admitting: Family Medicine

## 2024-04-18 DIAGNOSIS — R112 Nausea with vomiting, unspecified: Secondary | ICD-10-CM | POA: Diagnosis not present

## 2024-04-18 DIAGNOSIS — R197 Diarrhea, unspecified: Secondary | ICD-10-CM

## 2024-04-18 LAB — POCT URINALYSIS DIP (MANUAL ENTRY)
Blood, UA: NEGATIVE
Glucose, UA: NEGATIVE mg/dL
Leukocytes, UA: NEGATIVE
Nitrite, UA: NEGATIVE
Protein Ur, POC: 30 mg/dL — AB
Spec Grav, UA: 1.015 (ref 1.010–1.025)
Urobilinogen, UA: 4 U/dL — AB
pH, UA: 6 (ref 5.0–8.0)

## 2024-04-18 MED ORDER — ONDANSETRON 4 MG PO TBDP
4.0000 mg | ORAL_TABLET | Freq: Once | ORAL | Status: AC
  Administered 2024-04-18: 4 mg via ORAL

## 2024-04-18 MED ORDER — ONDANSETRON 4 MG PO TBDP: 4.0000 mg | ORAL_TABLET | Freq: Three times a day (TID) | ORAL | 0 refills | Status: DC | PRN

## 2024-04-18 NOTE — ED Triage Notes (Signed)
 Pt reports he is nauseas, has left side abdominal pain, fever, chills, sweats, and body aches, fatigue x 4 days Cannot take meds or keep food down.   States he bought some strawberries from [employer] that were on sale the day this started.

## 2024-04-20 DIAGNOSIS — A084 Viral intestinal infection, unspecified: Secondary | ICD-10-CM | POA: Diagnosis not present

## 2024-04-21 NOTE — ED Provider Notes (Signed)
 [facility]    CSN: [id number] Arrival date & time: 04/18/24  1455      History   Chief Complaint Chief Complaint  Patient presents with   Nausea    HPI [name] [name] is a 73 y.o. male.   Patient presenting today with 4-day history of nausea, vomiting, fever, chills, sweats, body aches, fatigue, left-sided abdominal cramping.  Denies cough, congestion, chest pain, shortness of breath, melena, hematemesis.  States symptoms started after eating some strawberries that were on sale from [employer].  No known sick contacts recently.  No new supplement or medication changes recently.  Not trying anything so far for symptoms as he states he cannot keep anything down.    Past Medical History:  Diagnosis Date   Allergy    Anxiety    Arthritis    back pain   Avascular necrosis of bone of hip (HCC)    right   CAD (coronary artery disease)    stents x3 2008   GERD (gastroesophageal reflux disease)    History of nuclear stress test    a. Myoview  10/17: EF 56%, diaphragmatic attenuation, no ischemia, low risk   HTN (hypertension)    Hyperlipidemia    Myocardial infarction (HCC)    2008- stents x 3    Paresthesias    Sleep apnea    no cpap    Thyroid  disease     Patient Active Problem List   Diagnosis Date Noted   Localized cutaneous nodular amyloidosis (HCC) 12/04/2023   Mass of right wrist 11/21/2023   Benign lipomatous neoplasm of skin and subcutaneous tissue of left leg 07/25/2023   Peripheral arterial disease (HCC) 05/01/2023   Hymenoptera allergy 10/23/2022   Rash and other nonspecific skin eruption 10/23/2022   Elevated blood pressure reading 10/23/2022   Anxiety 02/27/2022   Back pain 02/27/2022   Lipoma of right upper extremity 02/27/2022   Benign prostatic hyperplasia with lower urinary tract symptoms 02/27/2022   Generalized anxiety disorder 02/27/2022   Hypertensive retinopathy, bilateral 02/27/2022   Impacted cerumen, bilateral 02/27/2022    Insomnia, unspecified 02/27/2022   Male erectile dysfunction, unspecified 02/27/2022   Mixed conductive and sensorineural hearing loss, unilateral, left ear with restricted hearing on the contralateral side 02/27/2022   Myopathy, unspecified 02/27/2022   Obstructive sleep apnea (adult) (pediatric) 02/27/2022   Radiculopathy due to cervical spondylosis 02/27/2022   Rhinitis 02/27/2022   Unspecified cataract 02/27/2022   Aftercare following surgery 01/23/2021   S/P cervical spinal fusion 01/23/2021   Abnormality of esophagus 01/27/2020   Colon cancer screening 01/27/2020   Trigger index finger of left hand 01/06/2020   Acquired trigger finger of left index finger 05/26/2019   Esophageal dysphagia 02/08/2016   Hemorrhoids 02/08/2016   Chronic radicular cervical pain 07/29/2014   GERD (gastroesophageal reflux disease) 10/14/2012   Constipation 10/14/2012   Stiffness of joint, not elsewhere classified, other specified site 02/21/2012   PUD (peptic ulcer disease) 12/21/2011   Dysphagia 07/25/2011   Hyperlipidemia 08/12/2009   ESSENTIAL HYPERTENSION, BENIGN 08/12/2009   CAD, NATIVE VESSEL 08/12/2009   Hypothyroidism 06/11/2009   Dermatitis due to drug taken internally 01/09/2008   LOCALIZED SUPERFICIAL SWELLING MASS OR LUMP 01/09/2008    Past Surgical History:  Procedure Laterality Date   CARDIAC CATHETERIZATION     3 stents placed   COLONOSCOPY  2011 NUR [facility] SCREENING d50 v5   MILD [place] TICS, ? PREP ARTIFACT V. PROCTITIS-rX:[place]. next TCS 2021.   ESOPHAGEAL DILATION N/A 03/02/2016  Procedure: ESOPHAGEAL DILATION;  Surgeon: Alyce Jubilee, MD;  Location: [facility];  Service: Endoscopy;  Laterality: N/A;   ESOPHAGOGASTRODUODENOSCOPY  10/03/11   small hiatal hernia/gastric ulcers/stricutre in distal esophagus   ESOPHAGOGASTRODUODENOSCOPY N/A 03/02/2016   Dr. Nolene Baumgarten: Schatzki's ring at GE junction s/p dilation, mild non-erosive gastritis/duodenitis    ESOPHAGOGASTRODUODENOSCOPY (EGD)  WITH PROPOFOL  N/A 02/09/2020   Dr. Nolene Baumgarten: Low-grade narrowing Schatzki ring status post dilation, moderate gastritis, no biopsies taken   MASS EXCISION Left 09/30/2014   Procedure: EXCISION MASS LEFT WRIST ;  Surgeon: Lyanne Sample, MD;  Location: [facility];  Service: Orthopedics;  Laterality: Left;   ROTATOR CUFF REPAIR Left    [facility]    SAVORY DILATION N/A 02/09/2020   Procedure: SAVORY DILATION;  Surgeon: Alyce Jubilee, MD;  Location: [facility];  Service: Endoscopy;  Laterality: N/A;   TONSILLECTOMY  age 24   TOTAL HIP ARTHROPLASTY  age 44   right hip for avascular necrosis of hip and spine   TRIGGER FINGER RELEASE Left 03/08/2020   Procedure: RELEASE TRIGGER FINGER/A-1 PULLEY LEFT INDEX FINGER;  Surgeon: Lyanne Sample, MD;  Location: [facility];  Service: Orthopedics;  Laterality: Left;  IV REGIONAL FOREARM BLOCK   UPPER GASTROINTESTINAL ENDOSCOPY     with dilation - last 02-2020        Home Medications    Prior to Admission medications   Medication Sig Start Date End Date Taking? Authorizing Provider  ondansetron  (ZOFRAN -ODT) 4 MG disintegrating tablet Take 1 tablet (4 mg total) by mouth every 8 (eight) hours as needed for nausea or vomiting. 04/18/24  Yes Corbin Dess, PA-C  acetaminophen  (TYLENOL ) 325 MG tablet Take 650 mg by mouth daily as needed (pain).    [provider]  aspirin  EC 81 MG tablet Take 1 tablet (81 mg total) by mouth daily. 01/24/18   Elmyra Haggard, MD  calcium  carbonate (TUMS EX) 750 MG chewable tablet Chew 1 tablet by mouth 2 (two) times daily as needed for heartburn.     [provider]  carvedilol  (COREG ) 6.25 MG tablet Take 0.5 tablets by mouth in the morning and at bedtime. 08/17/21   [provider]  desloratadine (CLARINEX) 5 MG tablet Take 5 mg by mouth daily. 12/03/22   [provider]  diphenhydrAMINE  (BENADRYL ) 50 MG tablet Take one tablet (50 mg) by mouth 1 hour prior to your  cardiac MRI. 02/12/24   Elmyra Haggard, MD  EPINEPHrine  0.3 mg/0.3 mL IJ SOAJ injection Inject 0.3 mg into the muscle as needed for anaphylaxis. 03/23/21   Ballard Bongo, MD  ezetimibe  (ZETIA ) 10 MG tablet Take 0.5 tablets (5 mg total) by mouth daily. 04/03/22   Ross, Paula V, MD  hydrochlorothiazide (HYDRODIURIL) 25 MG tablet Take 12.5 mg by mouth 2 (two) times daily.    [provider]  inclisiran (LEQVIO ) 284 MG/1.5ML SOSY injection Inject 1.5 mLs (284 mg total) into the skin once for 1 dose. 10/30/23   Elmyra Haggard, MD  levothyroxine (SYNTHROID, LEVOTHROID) 75 MCG tablet Take 75 mcg by mouth daily before breakfast.    [provider]  losartan  (COZAAR ) 100 MG tablet Take 50 mg by mouth 2 (two) times daily. 07/09/19   [provider]  omeprazole  (PRILOSEC) 20 MG capsule Take 20 mg by mouth daily. 10/13/22   [provider]  pravastatin  (PRAVACHOL ) 40 MG tablet Take 20 mg by mouth 2 (two) times daily.  [provider]  predniSONE  (DELTASONE ) 50 MG tablet Take one tablet (50 mg) by mouth 13 hours prior to your MRI, then another tablet (50 mg) 7 hours prior to your MRI, and then take your last tablet (50 mg) 1 hour prior to your MRI. 02/12/24   Elmyra Haggard, MD  pregabalin (LYRICA) 25 MG capsule Take 25 mg by mouth daily. 04/01/23   [provider]  tadalafil (CIALIS) 20 MG tablet TAKE ONE TABLET BY MOUTH AS INSTRUCTED AS NEEDED (TAKE 1 HOUR PRIOR TO SEXUAL ACTIVITY *DO NOT EXCEED 1 DOSE PER 24 06/12/21   [provider]  triamcinolone  cream (KENALOG ) 0.1 % Apply 1 application topically 2 (two) times daily as needed (as directed).    [provider]  metoprolol  tartrate (LOPRESSOR ) 25 MG tablet Take 1 tablet (25 mg total) by mouth 2 (two) times daily as needed (palpitations). 03/28/21 03/28/21  Ballard Bongo, MD    Family History Family History  Problem Relation Age of Onset   Dementia Mother    Prostate cancer  Father 18   Lung cancer Sister 43   Colon cancer Neg Hx    Colon polyps Neg Hx    Anesthesia problems Neg Hx    Hypotension Neg Hx    Malignant hyperthermia Neg Hx    Pseudochol deficiency Neg Hx    Esophageal cancer Neg Hx    Stomach cancer Neg Hx    Rectal cancer Neg Hx     Social History Social History   Tobacco Use   Smoking status: Former    Current packs/day: 0.00    Average packs/day: 1 pack/day for 30.0 years (30.0 ttl pk-yrs)    Types: Cigarettes    Start date: 09/29/1977    Quit date: 09/30/2007    Years since quitting: 16.5   Smokeless tobacco: Never  Vaping Use   Vaping status: Never Used  Substance Use Topics   Alcohol use: No   Drug use: No     Allergies   Atorvastatin , Bee venom, Clopidogrel bisulfate, Rosuvastatin , Nexletol  [bempedoic acid ], Penicillins, Contrast media [iodinated contrast media], Flomax [tamsulosin], Gadolinium, Neomycin, Viagra [sildenafil], Clopidogrel, Omeprazole , Repatha  [evolocumab ], and Tape   Review of Systems Review of Systems Per HPI  Physical Exam Triage Vital Signs ED Triage Vitals  Encounter Vitals Group     BP 04/18/24 1523 112/76     Systolic BP Percentile --      Diastolic BP Percentile --      Pulse Rate 04/18/24 1523 77     Resp 04/18/24 1523 14     Temp 04/18/24 1523 98.6 F (37 C)     Temp Source 04/18/24 1523 Oral     SpO2 04/18/24 1523 98 %     Weight --      Height --      Head Circumference --      Peak Flow --      Pain Score 04/18/24 1524 4     Pain Loc --      Pain Education --      Exclude from Growth Chart --    No data found.  Updated Vital Signs BP 112/76 (BP Location: Right Arm)   Pulse 77   Temp 98.6 F (37 C) (Oral)   Resp 14   SpO2 98%   Visual Acuity Right Eye Distance:   Left Eye Distance:   Bilateral Distance:    Right Eye Near:   Left Eye Near:    Bilateral  Near:     Physical Exam Vitals and nursing note reviewed.  Constitutional:      Appearance: Normal  appearance.  HENT:     Head: Atraumatic.     Mouth/Throat:     Mouth: Mucous membranes are moist.     Pharynx: Oropharynx is clear.  Eyes:     Extraocular Movements: Extraocular movements intact.     Conjunctiva/sclera: Conjunctivae normal.  Cardiovascular:     Rate and Rhythm: Normal rate and regular rhythm.  Pulmonary:     Effort: Pulmonary effort is normal.     Breath sounds: Normal breath sounds.  Abdominal:     General: Bowel sounds are normal. There is no distension.     Palpations: Abdomen is soft.     Tenderness: There is no abdominal tenderness. There is no right CVA tenderness, left CVA tenderness or guarding.  Musculoskeletal:        General: Normal range of motion.     Cervical back: Normal range of motion and neck supple.  Skin:    General: Skin is warm and dry.  Neurological:     General: No focal deficit present.     Mental Status: He is oriented to person, place, and time.  Psychiatric:        Mood and Affect: Mood normal.        Thought Content: Thought content normal.        Judgment: Judgment normal.      UC Treatments / Results  Labs (all labs ordered are listed, but only abnormal results are displayed) Labs Reviewed  POCT URINALYSIS DIP (MANUAL ENTRY) - Abnormal; Notable for the following components:      Result Value   Bilirubin, UA small (*)    Ketones, POC UA small (15) (*)    Protein Ur, POC =30 (*)    Urobilinogen, UA 4.0 (*)    All other components within normal limits    EKG   Radiology No results found.  Procedures Procedures (including critical care time)  Medications Ordered in UC Medications  ondansetron  (ZOFRAN -ODT) disintegrating tablet 4 mg (4 mg Oral Given 04/18/24 1522)    Initial Impression / Assessment and Plan / UC Course  I have reviewed the triage vital signs and the nursing notes.  Pertinent labs & imaging results that were available during my care of the patient were reviewed by me and considered in my medical  decision making (see chart for details).     Vitals and exam very reassuring today with no red flag findings.  Zofran  given in clinic which he states have provided significant benefit already.  Suspect viral versus food related GI illness, treat with Zofran , brat diet, fluids, supportive over-the-counter medications and home care.  Return for worsening symptoms.  Final Clinical Impressions(s) / UC Diagnoses   Final diagnoses:  Nausea vomiting and diarrhea   Discharge Instructions   None    ED Prescriptions     Medication Sig Dispense Auth. Provider   ondansetron  (ZOFRAN -ODT) 4 MG disintegrating tablet Take 1 tablet (4 mg total) by mouth every 8 (eight) hours as needed for nausea or vomiting. 20 tablet Corbin Dess, [place]      PDMP not reviewed this encounter.   Corbin Dess, [place] 04/21/24 1419

## 2024-06-03 ENCOUNTER — Ambulatory Visit (INDEPENDENT_AMBULATORY_CARE_PROVIDER_SITE_OTHER)

## 2024-06-03 ENCOUNTER — Ambulatory Visit
Admission: EM | Admit: 2024-06-03 | Discharge: 2024-06-03 | Disposition: A | Discharge status: Home / Self Care | Attending: Physician Assistant | Admitting: Physician Assistant

## 2024-06-03 ENCOUNTER — Encounter: Payer: Self-pay | Admitting: Emergency Medicine

## 2024-06-03 ENCOUNTER — Ambulatory Visit: Payer: Self-pay | Admitting: Physician Assistant

## 2024-06-03 DIAGNOSIS — R1030 Lower abdominal pain, unspecified: Secondary | ICD-10-CM | POA: Diagnosis not present

## 2024-06-03 DIAGNOSIS — R109 Unspecified abdominal pain: Secondary | ICD-10-CM | POA: Diagnosis not present

## 2024-06-03 LAB — POCT URINALYSIS DIP (MANUAL ENTRY)
Bilirubin, UA: NEGATIVE
Blood, UA: NEGATIVE
Glucose, UA: NEGATIVE mg/dL
Ketones, POC UA: NEGATIVE mg/dL
Leukocytes, UA: NEGATIVE
Nitrite, UA: NEGATIVE
Protein Ur, POC: NEGATIVE mg/dL
Spec Grav, UA: 1.015
Urobilinogen, UA: 0.2 U/dL
pH, UA: 7

## 2024-06-03 NOTE — ED Provider Notes (Signed)
 [facility]    CSN: [id number] Arrival date & time: 06/03/24  1427      History   Chief Complaint No chief complaint on file.   HPI [name] [name] is a 73 y.o. male.   Patient presents today with a 2-week history of lower abdominal pain that radiates to his back.  He reports that the pain is much worse when he goes to bed and will sometimes wake him up at night.  Pain is rated 5 on a 0 to pain scale but increases at night, described as intense aching, no aggravating relieving factors identified.  He denies any urinary symptoms including frequency, urgency, hematuria.  He does report a history of BPH and lower urinary tract symptoms including nocturia that are unchanged from baseline.  He had been struggling with constipation so recently started taking a probiotic as well as Metamucil and has had increased frequency of stool.  His last bowel movement was yesterday and was larger than normal but denies any associated melena, hematochezia, fever, nausea, vomiting.  He denies history of gastrointestinal disorder including ulcerative colitis, Crohn's disease, diverticulitis.  Does report he had a colonoscopy about 5 years ago where they noted he had diverticula but he has never had an issue with this in the past.  Denies previous abdominal surgery and still has gallbladder/appendix.    Past Medical History:  Diagnosis Date   Allergy    Anxiety    Arthritis    back pain   Avascular necrosis of bone of hip (HCC)    right   CAD (coronary artery disease)    stents x3 2008   GERD (gastroesophageal reflux disease)    History of nuclear stress test    a. Myoview  10/17: EF 56%, diaphragmatic attenuation, no ischemia, low risk   HTN (hypertension)    Hyperlipidemia    Myocardial infarction (HCC)    2008- stents x 3    Paresthesias    Sleep apnea    no cpap    Thyroid  disease     Patient Active Problem List   Diagnosis Date Noted   Localized cutaneous nodular amyloidosis  (HCC) 12/04/2023   Mass of right wrist 11/21/2023   Benign lipomatous neoplasm of skin and subcutaneous tissue of left leg 07/25/2023   Peripheral arterial disease (HCC) 05/01/2023   Hymenoptera allergy 10/23/2022   Rash and other nonspecific skin eruption 10/23/2022   Elevated blood pressure reading 10/23/2022   Anxiety 02/27/2022   Back pain 02/27/2022   Lipoma of right upper extremity 02/27/2022   Benign prostatic hyperplasia with lower urinary tract symptoms 02/27/2022   Generalized anxiety disorder 02/27/2022   Hypertensive retinopathy, bilateral 02/27/2022   Impacted cerumen, bilateral 02/27/2022   Insomnia, unspecified 02/27/2022   Male erectile dysfunction, unspecified 02/27/2022   Mixed conductive and sensorineural hearing loss, unilateral, left ear with restricted hearing on the contralateral side 02/27/2022   Myopathy, unspecified 02/27/2022   Obstructive sleep apnea (adult) (pediatric) 02/27/2022   Radiculopathy due to cervical spondylosis 02/27/2022   Rhinitis 02/27/2022   Unspecified cataract 02/27/2022   Aftercare following surgery 01/23/2021   S/P cervical spinal fusion 01/23/2021   Abnormality of esophagus 01/27/2020   Colon cancer screening 01/27/2020   Trigger index finger of left hand 01/06/2020   Acquired trigger finger of left index finger 05/26/2019   Esophageal dysphagia 02/08/2016   Hemorrhoids 02/08/2016   Chronic radicular cervical pain 07/29/2014   GERD (gastroesophageal reflux disease) 10/14/2012   Constipation 10/14/2012  Stiffness of joint, not elsewhere classified, other specified site 02/21/2012   PUD (peptic ulcer disease) 12/21/2011   Dysphagia 07/25/2011   Hyperlipidemia 08/12/2009   ESSENTIAL HYPERTENSION, BENIGN 08/12/2009   CAD, NATIVE VESSEL 08/12/2009   Hypothyroidism 06/11/2009   Dermatitis due to drug taken internally 01/09/2008   LOCALIZED SUPERFICIAL SWELLING MASS OR LUMP 01/09/2008    Past Surgical History:  Procedure  Laterality Date   CARDIAC CATHETERIZATION     3 stents placed   COLONOSCOPY  2011 NUR [facility] SCREENING d50 v5   MILD [place] TICS, ? PREP ARTIFACT V. PROCTITIS-rX:[place]. next TCS 2021.   ESOPHAGEAL DILATION N/A 03/02/2016   Procedure: ESOPHAGEAL DILATION;  Surgeon: Alyce Jubilee, MD;  Location: [facility];  Service: Endoscopy;  Laterality: N/A;   ESOPHAGOGASTRODUODENOSCOPY  10/03/11   small hiatal hernia/gastric ulcers/stricutre in distal esophagus   ESOPHAGOGASTRODUODENOSCOPY N/A 03/02/2016   Dr. Nolene Baumgarten: Schatzki's ring at GE junction s/p dilation, mild non-erosive gastritis/duodenitis    ESOPHAGOGASTRODUODENOSCOPY (EGD) WITH PROPOFOL  N/A 02/09/2020   Dr. Nolene Baumgarten: Low-grade narrowing Schatzki ring status post dilation, moderate gastritis, no biopsies taken   MASS EXCISION Left 09/30/2014   Procedure: EXCISION MASS LEFT WRIST ;  Surgeon: Lyanne Sample, MD;  Location: [facility];  Service: Orthopedics;  Laterality: Left;   ROTATOR CUFF REPAIR Left    [facility]    SAVORY DILATION N/A 02/09/2020   Procedure: SAVORY DILATION;  Surgeon: Alyce Jubilee, MD;  Location: [facility];  Service: Endoscopy;  Laterality: N/A;   TONSILLECTOMY  age 11   TOTAL HIP ARTHROPLASTY  age 27   right hip for avascular necrosis of hip and spine   TRIGGER FINGER RELEASE Left 03/08/2020   Procedure: RELEASE TRIGGER FINGER/A-1 PULLEY LEFT INDEX FINGER;  Surgeon: Lyanne Sample, MD;  Location: [facility];  Service: Orthopedics;  Laterality: Left;  IV REGIONAL FOREARM BLOCK   UPPER GASTROINTESTINAL ENDOSCOPY     with dilation - last 02-2020        Home Medications    Prior to Admission medications   Medication Sig Start Date End Date Taking? Authorizing Provider  acetaminophen  (TYLENOL ) 325 MG tablet Take 650 mg by mouth daily as needed (pain).    [provider]  aspirin  EC 81 MG tablet Take 1 tablet (81 mg total) by mouth daily. 01/24/18   Elmyra Haggard, MD  calcium  carbonate (TUMS EX) 750  MG chewable tablet Chew 1 tablet by mouth 2 (two) times daily as needed for heartburn.     [provider]  carvedilol  (COREG ) 6.25 MG tablet Take 0.5 tablets by mouth in the morning and at bedtime. 08/17/21   [provider]  desloratadine (CLARINEX) 5 MG tablet Take 5 mg by mouth daily. 12/03/22   [provider]  diphenhydrAMINE  (BENADRYL ) 50 MG tablet Take one tablet (50 mg) by mouth 1 hour prior to your cardiac MRI. 02/12/24   Elmyra Haggard, MD  EPINEPHrine  0.3 mg/0.3 mL IJ SOAJ injection Inject 0.3 mg into the muscle as needed for anaphylaxis. 03/23/21   Ballard Bongo, MD  ezetimibe  (ZETIA ) 10 MG tablet Take 0.5 tablets (5 mg total) by mouth daily. 04/03/22   Ross, Paula V, MD  hydrochlorothiazide (HYDRODIURIL) 25 MG tablet Take 12.5 mg by mouth 2 (two) times daily.    [provider]  inclisiran (LEQVIO ) 284 MG/1.5ML SOSY injection Inject 1.5 mLs (284 mg total) into the skin once for 1 dose. 10/30/23   Ola Berger  V, MD  levothyroxine (SYNTHROID, LEVOTHROID) 75 MCG tablet Take 75 mcg by mouth daily before breakfast.    [provider]  losartan  (COZAAR ) 100 MG tablet Take 50 mg by mouth 2 (two) times daily. 07/09/19   [provider]  omeprazole  (PRILOSEC) 20 MG capsule Take 20 mg by mouth daily. 10/13/22   [provider]  ondansetron  (ZOFRAN -ODT) 4 MG disintegrating tablet Take 1 tablet (4 mg total) by mouth every 8 (eight) hours as needed for nausea or vomiting. 04/18/24   Corbin Dess, PA-C  pravastatin  (PRAVACHOL ) 40 MG tablet Take 20 mg by mouth 2 (two) times daily.    [provider]  pregabalin (LYRICA) 25 MG capsule Take 25 mg by mouth daily. 04/01/23   [provider]  tadalafil (CIALIS) 20 MG tablet TAKE ONE TABLET BY MOUTH AS INSTRUCTED AS NEEDED (TAKE 1 HOUR PRIOR TO SEXUAL ACTIVITY *DO NOT EXCEED 1 DOSE PER 24 06/12/21   [provider]  triamcinolone  cream (KENALOG ) 0.1 % Apply 1  application topically 2 (two) times daily as needed (as directed).    [provider]  metoprolol  tartrate (LOPRESSOR ) 25 MG tablet Take 1 tablet (25 mg total) by mouth 2 (two) times daily as needed (palpitations). 03/28/21 03/28/21  Ballard Bongo, MD    Family History Family History  Problem Relation Age of Onset   Dementia Mother    Prostate cancer Father 64   Lung cancer Sister 4   Colon cancer Neg Hx    Colon polyps Neg Hx    Anesthesia problems Neg Hx    Hypotension Neg Hx    Malignant hyperthermia Neg Hx    Pseudochol deficiency Neg Hx    Esophageal cancer Neg Hx    Stomach cancer Neg Hx    Rectal cancer Neg Hx     Social History Social History   Tobacco Use   Smoking status: Former    Current packs/day: 0.00    Average packs/day: 1 pack/day for 30.0 years (30.0 ttl pk-yrs)    Types: Cigarettes    Start date: 09/29/1977    Quit date: 09/30/2007    Years since quitting: 16.6   Smokeless tobacco: Never  Vaping Use   Vaping status: Never Used  Substance Use Topics   Alcohol use: No   Drug use: No     Allergies   Atorvastatin , Bee venom, Clopidogrel bisulfate, Rosuvastatin , Nexletol  [bempedoic acid ], Penicillins, Contrast media [iodinated contrast media], Flomax [tamsulosin], Gadolinium, Neomycin, Viagra [sildenafil], Clopidogrel, Omeprazole , Repatha  [evolocumab ], and Tape   Review of Systems Review of Systems  Constitutional:  Positive for activity change. Negative for appetite change, fatigue and fever.  Respiratory:  Negative for cough and shortness of breath.   Cardiovascular:  Negative for chest pain.  Gastrointestinal:  Positive for abdominal pain. Negative for diarrhea, nausea and vomiting.  Genitourinary:  Negative for dysuria, flank pain, frequency, hematuria and urgency.  Musculoskeletal:  Positive for back pain. Negative for arthralgias and myalgias.  Neurological:  Negative for dizziness, light-headedness and headaches.      Physical Exam Triage Vital Signs ED Triage Vitals  Encounter Vitals Group     BP 06/03/24 1435 126/80     Systolic BP Percentile --      Diastolic BP Percentile --      Pulse Rate 06/03/24 1435 72     Resp 06/03/24 1435 16     Temp 06/03/24 1435 97.8 F (36.6 C)     Temp Source 06/03/24 1435  Oral     SpO2 06/03/24 1435 96 %     Weight --      Height --      Head Circumference --      Peak Flow --      Pain Score 06/03/24 1436 4     Pain Loc --      Pain Education --      Exclude from Growth Chart --    No data found.  Updated Vital Signs BP 126/80 (BP Location: Right Arm)   Pulse 72   Temp 97.8 F (36.6 C) (Oral)   Resp 16   SpO2 96%   Visual Acuity Right Eye Distance:   Left Eye Distance:   Bilateral Distance:    Right Eye Near:   Left Eye Near:    Bilateral Near:     Physical Exam Vitals reviewed.  Constitutional:      General: He is awake.     Appearance: Normal appearance. He is well-developed. He is not ill-appearing.     Comments: Very pleasant male appears stated age in no acute distress sitting comfortable in exam room  HENT:     Head: Normocephalic and atraumatic.     Mouth/Throat:     Pharynx: Uvula midline. No oropharyngeal exudate or posterior oropharyngeal erythema.  Cardiovascular:     Rate and Rhythm: Normal rate and regular rhythm.     Heart sounds: Normal heart sounds, S1 normal and S2 normal. No murmur heard. Pulmonary:     Effort: Pulmonary effort is normal.     Breath sounds: Normal breath sounds. No stridor. No wheezing, rhonchi or rales.     Comments: Clear to auscultation bilaterally Abdominal:     General: A surgical scar is present. Bowel sounds are normal.     Palpations: Abdomen is soft.     Tenderness: There is abdominal tenderness in the right lower quadrant, suprapubic area and left lower quadrant. There is no right CVA tenderness, left CVA tenderness, guarding or rebound. Negative signs include Murphy's sign,  Rovsing's sign, McBurney's sign and psoas sign.     Comments: Tender palpation over right lower abdomen without evidence of acute abdomen.  Several well-healed surgical scars noted abdominal wall that patient reports are related to lipoma removal.  Neurological:     Mental Status: He is alert.  Psychiatric:        Behavior: Behavior is cooperative.      UC Treatments / Results  Labs (all labs ordered are listed, but only abnormal results are displayed) Labs Reviewed  POCT URINALYSIS DIP (MANUAL ENTRY) - Normal    EKG   Radiology DG Abd 2 Views Result Date: 06/03/2024 CLINICAL DATA:  Abdominal pain. EXAM: ABDOMEN - 2 VIEW COMPARISON:  None Available. FINDINGS: Normal bowel gas pattern without free peritoneal air. Right hip prosthesis and lumbar and lower thoracic spine degenerative changes. There are areas of lucency adjacent to both components of the prosthesis. IMPRESSION: 1. No acute abnormality. 2. Right hip periprosthetic lucencies involving both components. This is not the typical appearance for loosening and may represent a combination of loosening and particle disease. Electronically Signed   By: Catherin Closs M.D.   On: 06/03/2024 15:13    Procedures Procedures (including critical care time)  Medications Ordered in UC Medications - No data to display  Initial Impression / Assessment and Plan / UC Course  I have reviewed the triage vital signs and the nursing notes.  Pertinent labs & imaging results that  were available during my care of the patient were reviewed by me and considered in my medical decision making (see chart for details).     Patient is well-appearing, afebrile, nontoxic, nontachycardic.  He had mild discomfort in his lower abdomen on exam but no evidence of acute abdomen on physical exam.  UA was obtained that showed no hematuria or other abnormalities making prostatitis or nephrolithiasis less likely particularly as he is not having significant urinary  symptoms.  X-ray was obtained that showed no evidence of obstruction or acute intra-abdominal abnormality.  It did show some evidence of lucencies surrounding periprosthetic of right hip and he reports that this has been in place since around 2000.  He is not experiencing any hip pain or abnormality but I did recommend he follow-up with orthopedic provider to investigate this further and ensure that a revision is not required.  I recommend that he follow-up with a gastroenterologist to investigate his abdominal pain further particular given our limited resources.  He was given the contact information for the provider who performed his colonoscopy and encouraged to call them to schedule an appointment.  We did discuss that it is possible that constipation is contributing to his symptoms and so I encouraged him to continue with the probiotic and Metamucil that has provided some relief of constipation in the hopes this will provide additional pain relief.  He can also use Tylenol  over-the-counter as needed.  We discussed that if anything worsens or changes he needs to be seen immediately including severe abdominal pain, focal abdominal pain, fever, nausea, vomiting, melena, hematochezia.  Strict return precautions given.  All questions answered to patient satisfaction.  Final Clinical Impressions(s) / UC Diagnoses   Final diagnoses:  Lower abdominal pain     Discharge Instructions      There was nothing abnormal in your urine.  I did see some stool in your colon on x-ray but did not see anything that was concerning for an obstruction.  I recommend you follow-up with a stomach specialist as soon as possible.  Call them to schedule an appointment.  Use Tylenol  for pain.  Continue with your probiotics and Metamucil to encourage daily bowel movement.  If anything changes and you have increasing pain, pain in 1 part of your abdomen, fever, nausea, vomiting, blood in your stool you need to be seen  immediately.   ED Prescriptions   None    PDMP not reviewed this encounter.   Budd Cargo, PA-C 06/03/24 1522

## 2024-06-03 NOTE — Discharge Instructions (Signed)
 There was nothing abnormal in your urine.  I did see some stool in your colon on x-ray but did not see anything that was concerning for an obstruction.  I recommend you follow-up with a stomach specialist as soon as possible.  Call them to schedule an appointment.  Use Tylenol  for pain.  Continue with your probiotics and Metamucil to encourage daily bowel movement.  If anything changes and you have increasing pain, pain in 1 part of your abdomen, fever, nausea, vomiting, blood in your stool you need to be seen immediately.

## 2024-06-03 NOTE — ED Triage Notes (Signed)
 Abd pain that goes around to back x 2 weeks.  States feels nauseated.  Last BM was yesterday and states it was larger than normal

## 2024-06-16 ENCOUNTER — Ambulatory Visit
Admission: EM | Admit: 2024-06-16 | Discharge: 2024-06-16 | Disposition: A | Discharge status: Home / Self Care | Attending: Nurse Practitioner | Admitting: Nurse Practitioner

## 2024-06-16 DIAGNOSIS — R350 Frequency of micturition: Secondary | ICD-10-CM | POA: Diagnosis not present

## 2024-06-16 DIAGNOSIS — N50812 Left testicular pain: Secondary | ICD-10-CM | POA: Insufficient documentation

## 2024-06-16 DIAGNOSIS — N451 Epididymitis: Secondary | ICD-10-CM | POA: Insufficient documentation

## 2024-06-16 LAB — POCT URINALYSIS DIP (MANUAL ENTRY)
Bilirubin, UA: NEGATIVE
Glucose, UA: NEGATIVE mg/dL
Ketones, POC UA: NEGATIVE mg/dL
Leukocytes, UA: NEGATIVE
Nitrite, UA: NEGATIVE
Protein Ur, POC: NEGATIVE mg/dL
Spec Grav, UA: <=1.005 — AB (ref 1.010–1.025)
Urobilinogen, UA: 0.2 U/dL
pH, UA: 6 (ref 5.0–8.0)

## 2024-06-16 MED ORDER — LEVOFLOXACIN 500 MG PO TABS: 500.0000 mg | ORAL_TABLET | Freq: Every day | ORAL | 0 refills | Status: AC

## 2024-06-16 NOTE — Discharge Instructions (Signed)
 As we discussed, I am concerned you may have inflammation/infection of your left epididymis.  Take the Levaquin as prescribed to treat it.  We will contact you if the urine culture shows we need to change the antibiotic anyway.  If symptoms do not fully improve after treatment, recommend follow-up with primary care provider and/or urology-contact information has been provided.

## 2024-06-16 NOTE — ED Triage Notes (Signed)
 Pt reports he has testicle pain and heat and low abdominal pain and lose bowel x 2 weeks   Has primary care appointment tomorrow

## 2024-06-16 NOTE — ED Provider Notes (Signed)
 [facility]    CSN: [id number] Arrival date & time: 06/16/24  1253      History   Chief Complaint Chief Complaint  Patient presents with   Testicle Pain    HPI [name] [name] is a 73 y.o. male.   Patient presents today with 2-week history of scrotal pain, warmth in his scrotum, and urine feeling warmer than normal.  He also endorses dysuria, urinary frequency, and urgency.  Reports a few weeks ago, he had a stomach bug and then developed nausea and vomiting and diarrhea which have all now resolved.  He denies hematuria, fever, nausea/vomiting.  Reports intermittent flank pain and is having intermittent lower abdominal pain and loose bowel movements.  No penile discharge or recent sexual intercourse.  No concern for STI.  Reports he saw urologist years ago but has not followed up with them recently for anything.    Past Medical History:  Diagnosis Date   Allergy    Anxiety    Arthritis    back pain   Avascular necrosis of bone of hip (HCC)    right   CAD (coronary artery disease)    stents x3 2008   GERD (gastroesophageal reflux disease)    History of nuclear stress test    a. Myoview  10/17: EF 56%, diaphragmatic attenuation, no ischemia, low risk   HTN (hypertension)    Hyperlipidemia    Myocardial infarction (HCC)    2008- stents x 3    Paresthesias    Sleep apnea    no cpap    Thyroid  disease     Patient Active Problem List   Diagnosis Date Noted   Localized cutaneous nodular amyloidosis (HCC) 12/04/2023   Mass of right wrist 11/21/2023   Benign lipomatous neoplasm of skin and subcutaneous tissue of left leg 07/25/2023   Peripheral arterial disease (HCC) 05/01/2023   Hymenoptera allergy 10/23/2022   Rash and other nonspecific skin eruption 10/23/2022   Elevated blood pressure reading 10/23/2022   Anxiety 02/27/2022   Back pain 02/27/2022   Lipoma of right upper extremity 02/27/2022   Benign prostatic hyperplasia with lower urinary tract  symptoms 02/27/2022   Generalized anxiety disorder 02/27/2022   Hypertensive retinopathy, bilateral 02/27/2022   Impacted cerumen, bilateral 02/27/2022   Insomnia, unspecified 02/27/2022   Male erectile dysfunction, unspecified 02/27/2022   Mixed conductive and sensorineural hearing loss, unilateral, left ear with restricted hearing on the contralateral side 02/27/2022   Myopathy, unspecified 02/27/2022   Obstructive sleep apnea (adult) (pediatric) 02/27/2022   Radiculopathy due to cervical spondylosis 02/27/2022   Rhinitis 02/27/2022   Unspecified cataract 02/27/2022   Aftercare following surgery 01/23/2021   S/P cervical spinal fusion 01/23/2021   Abnormality of esophagus 01/27/2020   Colon cancer screening 01/27/2020   Trigger index finger of left hand 01/06/2020   Acquired trigger finger of left index finger 05/26/2019   Esophageal dysphagia 02/08/2016   Hemorrhoids 02/08/2016   Chronic radicular cervical pain 07/29/2014   GERD (gastroesophageal reflux disease) 10/14/2012   Constipation 10/14/2012   Stiffness of joint, not elsewhere classified, other specified site 02/21/2012   PUD (peptic ulcer disease) 12/21/2011   Dysphagia 07/25/2011   Hyperlipidemia 08/12/2009   ESSENTIAL HYPERTENSION, BENIGN 08/12/2009   CAD, NATIVE VESSEL 08/12/2009   Hypothyroidism 06/11/2009   Dermatitis due to drug taken internally 01/09/2008   LOCALIZED SUPERFICIAL SWELLING MASS OR LUMP 01/09/2008    Past Surgical History:  Procedure Laterality Date   CARDIAC CATHETERIZATION     3  stents placed   COLONOSCOPY  2011 NUR [facility] SCREENING d50 v5   MILD [place] TICS, ? PREP ARTIFACT V. PROCTITIS-rX:[place]. next TCS 2021.   ESOPHAGEAL DILATION N/A 03/02/2016   Procedure: ESOPHAGEAL DILATION;  Surgeon: Alyce Jubilee, MD;  [facility];  Service: Endoscopy;  Laterality: N/A;   ESOPHAGOGASTRODUODENOSCOPY  10/03/11   small hiatal hernia/gastric ulcers/stricutre in distal esophagus    ESOPHAGOGASTRODUODENOSCOPY N/A 03/02/2016   Dr. Nolene Baumgarten: Schatzki's ring at GE junction s/p dilation, mild non-erosive gastritis/duodenitis    ESOPHAGOGASTRODUODENOSCOPY (EGD) WITH PROPOFOL  N/A 02/09/2020   Dr. Nolene Baumgarten: Low-grade narrowing Schatzki ring status post dilation, moderate gastritis, no biopsies taken   MASS EXCISION Left 09/30/2014   Procedure: EXCISION MASS LEFT WRIST ;  Surgeon: Lyanne Sample, MD;  Location: [facility];  Service: Orthopedics;  Laterality: Left;   ROTATOR CUFF REPAIR Left    [facility]    SAVORY DILATION N/A 02/09/2020   Procedure: SAVORY DILATION;  Surgeon: Alyce Jubilee, MD;  Location: [facility];  Service: Endoscopy;  Laterality: N/A;   TONSILLECTOMY  age 35   TOTAL HIP ARTHROPLASTY  age 6   right hip for avascular necrosis of hip and spine   TRIGGER FINGER RELEASE Left 03/08/2020   Procedure: RELEASE TRIGGER FINGER/A-1 PULLEY LEFT INDEX FINGER;  Surgeon: Lyanne Sample, MD;  Location: [facility];  Service: Orthopedics;  Laterality: Left;  IV REGIONAL FOREARM BLOCK   UPPER GASTROINTESTINAL ENDOSCOPY     with dilation - last 02-2020        Home Medications    Prior to Admission medications   Medication Sig Start Date End Date Taking? Authorizing Provider  levofloxacin (LEVAQUIN) 500 MG tablet Take 1 tablet (500 mg total) by mouth daily for 10 days. 06/16/24 06/26/24 Yes Wilhemena Harbour, NP  acetaminophen  (TYLENOL ) 325 MG tablet Take 650 mg by mouth daily as needed (pain).    [provider]  aspirin  EC 81 MG tablet Take 1 tablet (81 mg total) by mouth daily. 01/24/18   Elmyra Haggard, MD  calcium  carbonate (TUMS EX) 750 MG chewable tablet Chew 1 tablet by mouth 2 (two) times daily as needed for heartburn.     [provider]  carvedilol  (COREG ) 6.25 MG tablet Take 0.5 tablets by mouth in the morning and at bedtime. 08/17/21   [provider]  desloratadine (CLARINEX) 5 MG tablet Take 5 mg by mouth daily. 12/03/22    [provider]  diphenhydrAMINE  (BENADRYL ) 50 MG tablet Take one tablet (50 mg) by mouth 1 hour prior to your cardiac MRI. 02/12/24   Elmyra Haggard, MD  EPINEPHrine  0.3 mg/0.3 mL IJ SOAJ injection Inject 0.3 mg into the muscle as needed for anaphylaxis. 03/23/21   Ballard Bongo, MD  ezetimibe  (ZETIA ) 10 MG tablet Take 0.5 tablets (5 mg total) by mouth daily. 04/03/22   Ross, Paula V, MD  hydrochlorothiazide (HYDRODIURIL) 25 MG tablet Take 12.5 mg by mouth 2 (two) times daily.    [provider]  inclisiran (LEQVIO ) 284 MG/1.5ML SOSY injection Inject 1.5 mLs (284 mg total) into the skin once for 1 dose. 10/30/23   Elmyra Haggard, MD  levothyroxine (SYNTHROID, LEVOTHROID) 75 MCG tablet Take 75 mcg by mouth daily before breakfast.    [provider]  losartan  (COZAAR ) 100 MG tablet Take 50 mg by mouth 2 (two) times daily. 07/09/19   [provider]  omeprazole  (PRILOSEC) 20 MG capsule Take 20 mg  by mouth daily. 10/13/22   [provider]  ondansetron  (ZOFRAN -ODT) 4 MG disintegrating tablet Take 1 tablet (4 mg total) by mouth every 8 (eight) hours as needed for nausea or vomiting. 04/18/24   Corbin Dess, PA-C  pravastatin  (PRAVACHOL ) 40 MG tablet Take 20 mg by mouth 2 (two) times daily.    [provider]  pregabalin (LYRICA) 25 MG capsule Take 25 mg by mouth daily. 04/01/23   [provider]  tadalafil (CIALIS) 20 MG tablet TAKE ONE TABLET BY MOUTH AS INSTRUCTED AS NEEDED (TAKE 1 HOUR PRIOR TO SEXUAL ACTIVITY *DO NOT EXCEED 1 DOSE PER 24 06/12/21   [provider]  triamcinolone  cream (KENALOG ) 0.1 % Apply 1 application topically 2 (two) times daily as needed (as directed).    [provider]  metoprolol  tartrate (LOPRESSOR ) 25 MG tablet Take 1 tablet (25 mg total) by mouth 2 (two) times daily as needed (palpitations). 03/28/21 03/28/21  Ballard Bongo, MD    Family History Family History  Problem  Relation Age of Onset   Dementia Mother    Prostate cancer Father 18   Lung cancer Sister 52   Colon cancer Neg Hx    Colon polyps Neg Hx    Anesthesia problems Neg Hx    Hypotension Neg Hx    Malignant hyperthermia Neg Hx    Pseudochol deficiency Neg Hx    Esophageal cancer Neg Hx    Stomach cancer Neg Hx    Rectal cancer Neg Hx     Social History Social History   Tobacco Use   Smoking status: Former    Current packs/day: 0.00    Average packs/day: 1 pack/day for 30.0 years (30.0 ttl pk-yrs)    Types: Cigarettes    Start date: 09/29/1977    Quit date: 09/30/2007    Years since quitting: 16.7   Smokeless tobacco: Never  Vaping Use   Vaping status: Never Used  Substance Use Topics   Alcohol use: No   Drug use: No     Allergies   Atorvastatin , Bee venom, Clopidogrel bisulfate, Rosuvastatin , Nexletol  [bempedoic acid ], Penicillins, Contrast media [iodinated contrast media], Flomax [tamsulosin], Gadolinium, Neomycin, Viagra [sildenafil], Clopidogrel, Omeprazole , Repatha  [evolocumab ], and Tape   Review of Systems Review of Systems Per HPI  Physical Exam Triage Vital Signs ED Triage Vitals [06/16/24 1314]  Encounter Vitals Group     BP 133/84     Girls Systolic BP Percentile      Girls Diastolic BP Percentile      Boys Systolic BP Percentile      Boys Diastolic BP Percentile      Pulse Rate 68     Resp 18     Temp 97.8 F (36.6 C)     Temp Source Oral     SpO2 95 %     Weight      Height      Head Circumference      Peak Flow      Pain Score      Pain Loc      Pain Education      Exclude from Growth Chart    No data found.  Updated Vital Signs BP 133/84 (BP Location: Right Arm)   Pulse 68   Temp 97.8 F (36.6 C) (Oral)   Resp 18   SpO2 95%   Visual Acuity Right Eye Distance:   Left Eye Distance:   Bilateral Distance:    Right Eye Near:  Left Eye Near:    Bilateral Near:     Physical Exam Vitals and nursing note reviewed. Exam conducted  with a chaperone present Fawn Hooks, RN).  Constitutional:      General: He is not in acute distress.    Appearance: Normal appearance. He is not toxic-appearing.  HENT:     Mouth/Throat:     Mouth: Mucous membranes are moist.     Pharynx: Oropharynx is clear.   Cardiovascular:     Rate and Rhythm: Normal rate and regular rhythm.  Pulmonary:     Effort: Pulmonary effort is normal. No respiratory distress.  Abdominal:     General: Bowel sounds are normal.     Tenderness: There is no right CVA tenderness or left CVA tenderness.  Genitourinary:    Pubic Area: No rash.      Penis: Uncircumcised.      Testes:        Right: Mass, tenderness or swelling not present.        Left: Tenderness present. Mass or swelling not present.     Epididymis:     Right: Not inflamed. No tenderness.     Left: Inflamed. Tenderness present.  Lymphadenopathy:     Lower Body: No right inguinal adenopathy. No left inguinal adenopathy.   Skin:    General: Skin is warm and dry.     Coloration: Skin is not jaundiced or pale.     Findings: No erythema.   Neurological:     Mental Status: He is alert and oriented to person, place, and time.   Psychiatric:        Behavior: Behavior is cooperative.      UC Treatments / Results  Labs (all labs ordered are listed, but only abnormal results are displayed) Labs Reviewed  POCT URINALYSIS DIP (MANUAL ENTRY) - Abnormal; Notable for the following components:      Result Value   Spec Grav, UA <=1.005 (*)    Blood, UA trace-intact (*)    All other components within normal limits  URINE CULTURE    EKG   Radiology No results found.  Procedures Procedures (including critical care time)  Medications Ordered in UC Medications - No data to display  Initial Impression / Assessment and Plan / UC Course  I have reviewed the triage vital signs and the nursing notes.  Pertinent labs & imaging results that were available during my care of the patient were  reviewed by me and considered in my medical decision making (see chart for details).   Patient is well-appearing, normotensive, afebrile, not tachycardic, not tachypneic, oxygenating well on room air.   1. Urinary frequency 2. Pain in left testicle 3. Epididymitis Vitals and exam are stable today Urinalysis shows trace intact blood, otherwise unremarkable Previous urinalysis was without any blood I am concerned for cystitis versus epididymitis given pain along epididymis on exam Will treat with Levaquin 500 mg daily for 10 days while urine culture is pending Recommended close follow-up with primary care provider and/or urology as needed if symptoms persist strict ER precautions discussed with patient  The patient was given the opportunity to ask questions.  All questions answered to their satisfaction.  The patient is in agreement to this plan.   Final Clinical Impressions(s) / UC Diagnoses   Final diagnoses:  Urinary frequency  Pain in left testicle  Epididymitis     Discharge Instructions      As we discussed, I am concerned you may have inflammation/infection of your  left epididymis.  Take the Levaquin as prescribed to treat it.  We will contact you if the urine culture shows we need to change the antibiotic anyway.  If symptoms do not fully improve after treatment, recommend follow-up with primary care provider and/or urology-contact information has been provided.     ED Prescriptions     Medication Sig Dispense Auth. Provider   levofloxacin (LEVAQUIN) 500 MG tablet Take 1 tablet (500 mg total) by mouth daily for 10 days. 10 tablet Wilhemena Harbour, NP      PDMP not reviewed this encounter.   Wilhemena Harbour, NP 06/16/24 [phone]

## 2024-06-17 LAB — URINE CULTURE: Culture: NO GROWTH

## 2024-06-18 ENCOUNTER — Ambulatory Visit (HOSPITAL_COMMUNITY): Payer: Self-pay

## 2024-06-22 ENCOUNTER — Telehealth: Payer: Self-pay

## 2024-06-22 MED ORDER — SULFAMETHOXAZOLE-TRIMETHOPRIM 800-160 MG PO TABS: 1.0000 | ORAL_TABLET | Freq: Two times a day (BID) | ORAL | 0 refills | Status: AC

## 2024-06-22 NOTE — Telephone Encounter (Signed)
 May discontinue Levaquin due to side effects, Bactrim sent instead.  Follow-up with primary care for recheck

## 2024-06-22 NOTE — Telephone Encounter (Signed)
 Pt was seen on 06/16/2024 for urinary frequency and discomfort, pt was given Levaquin to treat sx's, pt has RTC stating that he has been experiencing insomnia since starting the medication and stopped the medication shortly after. Pt is now wanting to know if there is a different medication that can treat his symptoms

## 2024-06-22 NOTE — Addendum Note (Signed)
 Addended by: Kerith Sherley E on: 06/22/2024 06:20 PM   Modules accepted: Orders

## 2024-07-01 DIAGNOSIS — N5201 Erectile dysfunction due to arterial insufficiency: Secondary | ICD-10-CM | POA: Diagnosis not present

## 2024-07-01 DIAGNOSIS — N401 Enlarged prostate with lower urinary tract symptoms: Secondary | ICD-10-CM | POA: Diagnosis not present

## 2024-07-01 DIAGNOSIS — R3911 Hesitancy of micturition: Secondary | ICD-10-CM | POA: Diagnosis not present

## 2024-07-01 DIAGNOSIS — Z8042 Family history of malignant neoplasm of prostate: Secondary | ICD-10-CM | POA: Diagnosis not present

## 2024-07-01 DIAGNOSIS — R3914 Feeling of incomplete bladder emptying: Secondary | ICD-10-CM | POA: Diagnosis not present

## 2024-07-01 DIAGNOSIS — Z125 Encounter for screening for malignant neoplasm of prostate: Secondary | ICD-10-CM | POA: Diagnosis not present

## 2024-07-01 DIAGNOSIS — N451 Epididymitis: Secondary | ICD-10-CM | POA: Diagnosis not present

## 2024-07-22 ENCOUNTER — Emergency Department (HOSPITAL_COMMUNITY)

## 2024-07-22 ENCOUNTER — Emergency Department (HOSPITAL_COMMUNITY)
Admission: EM | Admit: 2024-07-22 | Discharge: 2024-07-22 | Disposition: A | Discharge status: Home / Self Care | Attending: Emergency Medicine | Admitting: Emergency Medicine

## 2024-07-22 ENCOUNTER — Other Ambulatory Visit: Payer: Self-pay

## 2024-07-22 ENCOUNTER — Encounter (HOSPITAL_COMMUNITY): Payer: Self-pay | Admitting: Emergency Medicine

## 2024-07-22 DIAGNOSIS — R059 Cough, unspecified: Secondary | ICD-10-CM | POA: Diagnosis not present

## 2024-07-22 DIAGNOSIS — R002 Palpitations: Secondary | ICD-10-CM | POA: Diagnosis present

## 2024-07-22 DIAGNOSIS — I700 Atherosclerosis of aorta: Secondary | ICD-10-CM | POA: Diagnosis not present

## 2024-07-22 DIAGNOSIS — Z7982 Long term (current) use of aspirin: Secondary | ICD-10-CM | POA: Diagnosis not present

## 2024-07-22 DIAGNOSIS — R202 Paresthesia of skin: Secondary | ICD-10-CM | POA: Insufficient documentation

## 2024-07-22 LAB — BASIC METABOLIC PANEL WITH GFR
Anion gap: 13 (ref 5–15)
BUN: 12 mg/dL (ref 8–23)
CO2: 25 mmol/L (ref 22–32)
Calcium: 9.2 mg/dL (ref 8.9–10.3)
Chloride: 95 mmol/L — ABNORMAL LOW (ref 98–111)
Creatinine, Ser: 0.91 mg/dL (ref 0.61–1.24)
GFR, Estimated: >60 mL/min (ref >60)
Glucose, Bld: 151 mg/dL — ABNORMAL HIGH (ref 70–99)
Potassium: 3.9 mmol/L (ref 3.5–5.1)
Sodium: 133 mmol/L — ABNORMAL LOW (ref 135–145)

## 2024-07-22 LAB — CBC
HCT: 44.2 % (ref 39.0–52.0)
Hemoglobin: 14.8 g/dL (ref 13.0–17.0)
MCH: 30.2 pg (ref 26.0–34.0)
MCHC: 33.5 g/dL (ref 30.0–36.0)
MCV: 90.2 fL (ref 80.0–100.0)
Platelets: 231 K/uL (ref 150–400)
RBC: 4.9 MIL/uL (ref 4.22–5.81)
RDW: 13.7 % (ref 11.5–15.5)
WBC: 7.7 K/uL (ref 4.0–10.5)
nRBC: 0 % (ref 0.0–0.2)

## 2024-07-22 LAB — TROPONIN I (HIGH SENSITIVITY): Troponin I (High Sensitivity): 2 ng/L (ref <18)

## 2024-07-22 LAB — MAGNESIUM: Magnesium: 1.9 mg/dL (ref 1.7–2.4)

## 2024-07-22 NOTE — Discharge Instructions (Signed)
 Return to the ER if you develop new or worsening palpitations, chest pain, shortness of breath, or any other new/concerning symptoms.

## 2024-07-22 NOTE — ED Triage Notes (Signed)
 Pt states he feels like his heart is skipping around. States he was seen last year around the same time for this and his k+ was too high.

## 2024-07-22 NOTE — ED Provider Notes (Signed)
 [place] [facility] AT [facility] Provider Note   CSN: [id number] Arrival date & time: 07/22/24  1624     Patient presents with: Palpitations   [name] [name] is a 73 y.o. male.   HPI 73 year old male presents with palpitations.  Palpitations have been ongoing since this morning.  He states he has had this before with some potassium problems and did have some transient tingling in his hands earlier today that is gone.  For the past couple days he has had a mild cough with some chest congestion, feeling like he is getting a chest cold.  He denies any dyspnea or current chest pain.  He denies any syncope or near syncope.  In the past he has had this before and took an extra half tablet of carvedilol  which helped, so he took an extra half tablet just prior to arrival.  Prior to Admission medications   Medication Sig Start Date End Date Taking? Authorizing Provider  acetaminophen  (TYLENOL ) 325 MG tablet Take 650 mg by mouth daily as needed (pain).    [provider]  aspirin  EC 81 MG tablet Take 1 tablet (81 mg total) by mouth daily. 01/24/18   Okey Vina GAILS, MD  calcium  carbonate (TUMS EX) 750 MG chewable tablet Chew 1 tablet by mouth 2 (two) times daily as needed for heartburn.     [provider]  carvedilol  (COREG ) 6.25 MG tablet Take 0.5 tablets by mouth in the morning and at bedtime. 08/17/21   [provider]  desloratadine (CLARINEX) 5 MG tablet Take 5 mg by mouth daily. 12/03/22   [provider]  diphenhydrAMINE  (BENADRYL ) 50 MG tablet Take one tablet (50 mg) by mouth 1 hour prior to your cardiac MRI. 02/12/24   Okey Vina GAILS, MD  EPINEPHrine  0.3 mg/0.3 mL IJ SOAJ injection Inject 0.3 mg into the muscle as needed for anaphylaxis. 03/23/21   Haze Lonni PARAS, MD  ezetimibe  (ZETIA ) 10 MG tablet Take 0.5 tablets (5 mg total) by mouth daily. 04/03/22   Ross, Paula V, MD  hydrochlorothiazide (HYDRODIURIL) 25 MG tablet Take 12.5  mg by mouth 2 (two) times daily.    [provider]  inclisiran (LEQVIO ) 284 MG/1.5ML SOSY injection Inject 1.5 mLs (284 mg total) into the skin once for 1 dose. 10/30/23   Okey Vina GAILS, MD  levothyroxine (SYNTHROID, LEVOTHROID) 75 MCG tablet Take 75 mcg by mouth daily before breakfast.    [provider]  losartan  (COZAAR ) 100 MG tablet Take 50 mg by mouth 2 (two) times daily. 07/09/19   [provider]  omeprazole  (PRILOSEC) 20 MG capsule Take 20 mg by mouth daily. 10/13/22   [provider]  ondansetron  (ZOFRAN -ODT) 4 MG disintegrating tablet Take 1 tablet (4 mg total) by mouth every 8 (eight) hours as needed for nausea or vomiting. 04/18/24   Stuart Vernell Norris, PA-C  pravastatin  (PRAVACHOL ) 40 MG tablet Take 20 mg by mouth 2 (two) times daily.    [provider]  pregabalin (LYRICA) 25 MG capsule Take 25 mg by mouth daily. 04/01/23   [provider]  tadalafil (CIALIS) 20 MG tablet TAKE ONE TABLET BY MOUTH AS INSTRUCTED AS NEEDED (TAKE 1 HOUR PRIOR TO SEXUAL ACTIVITY *DO NOT EXCEED 1 DOSE PER 24 06/12/21   [provider]  triamcinolone  cream (KENALOG ) 0.1 % Apply 1 application topically 2 (two) times daily as needed (as directed).    [provider]  metoprolol  tartrate (LOPRESSOR ) 25 MG  tablet Take 1 tablet (25 mg total) by mouth 2 (two) times daily as needed (palpitations). 03/28/21 03/28/21  Haze Lonni PARAS, MD    Allergies: Atorvastatin , Bee venom, Clopidogrel bisulfate, Rosuvastatin , Nexletol  [bempedoic acid ], Penicillins, Contrast media [iodinated contrast media], Flomax [tamsulosin], Gadolinium, Neomycin, Viagra [sildenafil], Clopidogrel, Omeprazole , Repatha  [evolocumab ], and Tape    Review of Systems  HENT:  Positive for congestion.   Respiratory:  Positive for cough. Negative for shortness of breath.   Cardiovascular:  Positive for palpitations. Negative for chest pain and leg swelling.  Gastrointestinal:   Negative for diarrhea and vomiting.    Updated Vital Signs BP 128/88   Pulse 76   Temp 97.7 F (36.5 C)   Resp (!) 22   SpO2 97%   Physical Exam Vitals and nursing note reviewed.  Constitutional:      General: He is not in acute distress.    Appearance: He is well-developed. He is not ill-appearing or diaphoretic.  HENT:     Head: Normocephalic and atraumatic.  Cardiovascular:     Rate and Rhythm: Normal rate and regular rhythm.     Heart sounds: Normal heart sounds.  Pulmonary:     Effort: Pulmonary effort is normal.     Breath sounds: Normal breath sounds. No wheezing or rales.  Abdominal:     General: There is no distension.     Palpations: Abdomen is soft.     Tenderness: There is no abdominal tenderness.  Musculoskeletal:     Right lower leg: No edema.     Left lower leg: No edema.  Skin:    General: Skin is warm and dry.  Neurological:     Mental Status: He is alert.     (all labs ordered are listed, but only abnormal results are displayed) Labs Reviewed  BASIC METABOLIC PANEL WITH GFR - Abnormal; Notable for the following components:      Result Value   Sodium 133 (*)    Chloride 95 (*)    Glucose, Bld 151 (*)    All other components within normal limits  CBC  MAGNESIUM  TROPONIN I (HIGH SENSITIVITY)    EKG: EKG Interpretation Date/Time:  Wednesday July 22 2024 16:35:13 EDT Ventricular Rate:  89 PR Interval:  188 QRS Duration:  90 QT Interval:  384 QTC Calculation: 467 R Axis:   12  Text Interpretation: Sinus rhythm with Premature atrial complexes no acute ST/T changes similar to Aug 2024 Confirmed by Freddi Hamilton [phone]) on 07/22/2024 5:01:12 PM  Radiology: DG Chest 2 View Result Date: 07/22/2024 CLINICAL DATA:  Cough Pt states he feels like his heart is skipping around. States he was seen last year around the same time for this and his k+ was too high EXAM: CHEST - 2 VIEW COMPARISON:  Chest x-ray 08/13/2013 FINDINGS: The heart and  mediastinal contours are unchanged. Atherosclerotic plaque. No focal consolidation. No pulmonary edema. No pleural effusion. No pneumothorax. No acute osseous abnormality. IMPRESSION: *No active cardiopulmonary disease. * Aortic Atherosclerosis (ICD10-I70.0). Electronically Signed   By: Morgane  Naveau M.D.   On: 07/22/2024 17:58     Procedures   Medications Ordered in the ED - No data to display                                  Medical Decision Making Amount and/or Complexity of Data Reviewed Labs: ordered.    Details: Unremarkable electrolytes. Radiology: ordered and  independent interpretation performed.    Details: No pneumonia or CHF ECG/medicine tests: ordered and independent interpretation performed.    Details: Sinus rhythm, no ischemia   Patient presents with palpitations.  Reports the symptoms do seem better though still has had some palpitations while in the ED.  No chest pain though troponin was sent from triage.  However, he has been having symptoms all day and so I think ACS is unlikely, especially in the setting of a benign ECG and a negative troponin.  Potassium is normal.  I reviewed the telemetry alarms and there have been no significant arrhythmias noted.  I think is reasonable to have patient follow-up with his PCP and/or cardiology, which he is being referred to.  Will discharge with return precautions.     Final diagnoses:  Palpitations    ED Discharge Orders          Ordered    Ambulatory referral to Cardiology       Comments: If you have not heard from the Cardiology office within the next 72 hours please call [phone].   07/22/24 8146               Freddi Hamilton, MD 07/22/24 1901

## 2024-07-23 ENCOUNTER — Encounter: Payer: Self-pay | Admitting: Internal Medicine

## 2024-07-24 ENCOUNTER — Encounter: Payer: Self-pay | Admitting: Internal Medicine

## 2024-07-24 ENCOUNTER — Ambulatory Visit: Admitting: Internal Medicine

## 2024-07-24 VITALS — BP 112/76 | HR 65 | Ht 72.0 in | Wt 180.5 lb

## 2024-07-24 DIAGNOSIS — R002 Palpitations: Secondary | ICD-10-CM | POA: Diagnosis not present

## 2024-07-24 DIAGNOSIS — R194 Change in bowel habit: Secondary | ICD-10-CM

## 2024-07-24 DIAGNOSIS — K581 Irritable bowel syndrome with constipation: Secondary | ICD-10-CM | POA: Diagnosis not present

## 2024-07-24 DIAGNOSIS — K5909 Other constipation: Secondary | ICD-10-CM

## 2024-07-24 DIAGNOSIS — K5790 Diverticulosis of intestine, part unspecified, without perforation or abscess without bleeding: Secondary | ICD-10-CM

## 2024-07-24 NOTE — Patient Instructions (Signed)
 Please purchase the following medications over the counter and take as directed: VSL #3 taking one capsule by mouth twice daily x 4-6 weeks. The medication will be ready at your pharmacy on Monday.   Please purchase the following medications over the counter and take as directed: Miralax  daily.  Continue omeprazole  and Metamucil daily.  Your provider has ordered [facility] stool testing for you. You have received a kit from our office today containing all necessary supplies to complete this test. Please carefully read the stool collection instructions provided in the kit before opening the accompanying materials. In addition, be sure there is a label providing your full name and date of birth on the puritan opti-swab tube that is supplied in the kit (if you do not see a label with this information on your test tube, please make us  aware before test collection!). After completing the test, you should secure the purtian tube into the specimen biohazard bag. The [facility] E-Req sheet (including date and time of specimen collection) should be placed into the outside pocket of the specimen biohazard bag and returned to the [place] lab (basement floor of [facility]) within 3 days of collection. Please make sure to give the specimen to a staff member at the lab. DO NOT leave the specimen on the counter.   If the specimen date and time (can be found in the upper right boxed portion of the sheet) are not filled out on the E-Req sheet, the test will NOT be performed.   Follow up with Dr. Okey for your new symptoms of palpitations.   _______________________________________________________  If your blood pressure at your visit was 140/90 or greater, please contact your primary care physician to follow up on this.  _______________________________________________________  If you are age 74 or older, your body mass index should be between 23-30. Your Body mass index is 24.48  kg/m. If this is out of the aforementioned range listed, please consider follow up with your Primary Care Provider.  If you are age 17 or younger, your body mass index should be between 19-25. Your Body mass index is 24.48 kg/m. If this is out of the aformentioned range listed, please consider follow up with your Primary Care Provider.   ________________________________________________________  The [facility] providers would like to encourage you to use MYCHART to communicate with providers for non-urgent requests or questions.  Due to long hold times on the telephone, sending your provider a message by [facility] may be a faster and more efficient way to get a response.  Please allow 48 business hours for a response.  Please remember that this is for non-urgent requests.  _______________________________________________________  [facility] is using a team-based approach to care.  Your team is made up of your doctor and two to three APPS. Our APPS (Nurse Practitioners and Physician Assistants) work with your physician to ensure care continuity for you. They are fully qualified to address your health concerns and develop a treatment plan. They communicate directly with your gastroenterologist to care for you. Seeing the Advanced Practice Practitioners on your physician's team can help you by facilitating care more promptly, often allowing for earlier appointments, access to diagnostic testing, procedures, and other specialty referrals.

## 2024-07-24 NOTE — Progress Notes (Signed)
 Patient ID: [name] [name], male   DOB: 1951-06-04, 73 y.o.   MRN: [id number] HPI:  [name], [name], is a 73 year old male previously managed by Dr. Aneita with a history of GERD, Schatzki's ring status post dilation, colonic diverticulosis, history of coronary artery disease, amyloid, hypertension, hypothyroidism who presents with change in bowel habits and heart palpitations.  In April, he experienced severe nausea and vomiting due to food poisoning, lasting about a week. He visited urgent care and received medication for nausea.  This was followed by diarrhea persisted until he contacted his family doctor, who prescribed medication that he ultimately did not need to take as the symptoms resolved.  Following the resolution of the initial symptoms, he developed lower abdominal cramps and reported pain and swelling in his scrotum, which he believed to be an infection moving down, and he was diagnosed with epididymitis.' He was seen by a urologist and was prescribed an antibiotic, initially levofloxacin  but switched to Bactrim  due to side effects. He reports that things got better after taking Bactrim . The Bactrim  improved his condition.  He reports ongoing constipation with bowel movements occurring every couple of days, a change from his normal daily pattern. He describes his stools as 'rat pellets' and 'bumpy, large, and stiff,' causing pain during passage.  At times stools have also been large and hard to pass.  He has been using Metamucil and eating high-fiber foods like oatmeal to manage this.  Recently, he began experiencing heart palpitations, which started two days ago. He has a history of a heart attack in 2008 and three stents placed. He visited the emergency room where blood work revealed low sodium levels. He has not experienced chest pain but has a history of esophageal issues, for which he takes omeprazole  daily.  He mentions a past diagnosis of amyloidosis from a biopsy of a  presumed lipoma.      Past Medical History:  Diagnosis Date   Allergy    Anxiety    Arthritis    back pain   Avascular necrosis of bone of hip (HCC)    right   CAD (coronary artery disease)    stents x3 2008   GERD (gastroesophageal reflux disease)    History of nuclear stress test    a. Myoview  10/17: EF 56%, diaphragmatic attenuation, no ischemia, low risk   HTN (hypertension)    Hyperlipidemia    Myocardial infarction (HCC)    2008- stents x 3    Paresthesias    Sleep apnea    no cpap    Thyroid  disease     Past Surgical History:  Procedure Laterality Date   CARDIAC CATHETERIZATION     3 stents placed   COLONOSCOPY  2011 NUR [facility] SCREENING d50 v5   MILD [place] TICS, ? PREP ARTIFACT V. PROCTITIS-rX:[place]. next TCS 2021.   ESOPHAGEAL DILATION N/A 03/02/2016   Procedure: ESOPHAGEAL DILATION;  Surgeon: Margo LITTIE Haddock, MD;  [facility];  Service: Endoscopy;  Laterality: N/A;   ESOPHAGOGASTRODUODENOSCOPY  10/03/11   small hiatal hernia/gastric ulcers/stricutre in distal esophagus   ESOPHAGOGASTRODUODENOSCOPY N/A 03/02/2016   Dr. Haddock: Schatzki's ring at GE junction s/p dilation, mild non-erosive gastritis/duodenitis    ESOPHAGOGASTRODUODENOSCOPY (EGD) WITH PROPOFOL  N/A 02/09/2020   Dr. Haddock: Low-grade narrowing Schatzki ring status post dilation, moderate gastritis, no biopsies taken   MASS EXCISION Left 09/30/2014   Procedure: EXCISION MASS LEFT WRIST ;  Surgeon: Arley Curia, MD;  Location: [facility];  Service: Orthopedics;  Laterality: Left;   ROTATOR CUFF REPAIR Left    [facility]    SAVORY DILATION N/A 02/09/2020   Procedure: SAVORY DILATION;  Surgeon: Harvey Margo CROME, MD;  [facility]: [facility];  Service: Endoscopy;  Laterality: N/A;   TONSILLECTOMY  age 83   TOTAL HIP ARTHROPLASTY  age 27   right hip for avascular necrosis of hip and spine   TRIGGER FINGER RELEASE Left 03/08/2020   Procedure: RELEASE TRIGGER FINGER/A-1 PULLEY LEFT INDEX FINGER;   Surgeon: Murrell Kuba, MD;  Location: [facility];  Service: Orthopedics;  Laterality: Left;  IV REGIONAL FOREARM BLOCK   UPPER GASTROINTESTINAL ENDOSCOPY     with dilation - last 02-2020     Outpatient Medications Prior to Visit  Medication Sig Dispense Refill   acetaminophen  (TYLENOL ) 325 MG tablet Take 650 mg by mouth daily as needed (pain).     aspirin  EC 81 MG tablet Take 1 tablet (81 mg total) by mouth daily. 90 tablet 3   calcium  carbonate (TUMS EX) 750 MG chewable tablet Chew 1 tablet by mouth 2 (two) times daily as needed for heartburn.      carvedilol  (COREG ) 6.25 MG tablet Take 0.5 tablets by mouth in the morning and at bedtime.     desloratadine (CLARINEX) 5 MG tablet Take 5 mg by mouth daily.     diphenhydrAMINE  (BENADRYL ) 50 MG tablet Take one tablet (50 mg) by mouth 1 hour prior to your cardiac MRI. 1 tablet 0   EPINEPHrine  0.3 mg/0.3 mL IJ SOAJ injection Inject 0.3 mg into the muscle as needed for anaphylaxis. 1 each 0   ezetimibe  (ZETIA ) 10 MG tablet Take 0.5 tablets (5 mg total) by mouth daily.     hydrochlorothiazide (HYDRODIURIL) 25 MG tablet Take 12.5 mg by mouth 2 (two) times daily.     inclisiran (LEQVIO ) 284 MG/1.5ML SOSY injection Inject 1.5 mLs (284 mg total) into the skin once for 1 dose.     levothyroxine (SYNTHROID, LEVOTHROID) 75 MCG tablet Take 75 mcg by mouth daily before breakfast.     losartan  (COZAAR ) 100 MG tablet Take 50 mg by mouth 2 (two) times daily.     omeprazole  (PRILOSEC) 20 MG capsule Take 20 mg by mouth daily.     ondansetron  (ZOFRAN -ODT) 4 MG disintegrating tablet Take 1 tablet (4 mg total) by mouth every 8 (eight) hours as needed for nausea or vomiting. 20 tablet 0   pravastatin  (PRAVACHOL ) 40 MG tablet Take 20 mg by mouth 2 (two) times daily.     pregabalin (LYRICA) 25 MG capsule Take 25 mg by mouth daily.     psyllium (METAMUCIL) 58.6 % powder Take 1 packet by mouth daily.     tadalafil (CIALIS) 20 MG tablet TAKE ONE TABLET BY  MOUTH AS INSTRUCTED AS NEEDED (TAKE 1 HOUR PRIOR TO SEXUAL ACTIVITY *DO NOT EXCEED 1 DOSE PER 24     triamcinolone  cream (KENALOG ) 0.1 % Apply 1 application topically 2 (two) times daily as needed (as directed).     No facility-administered medications prior to visit.    Allergies  Allergen Reactions   Atorvastatin  Other (See Comments)    Muscle aches   Bee Venom Anaphylaxis, Hives and Swelling   Clopidogrel Bisulfate Hives   Rosuvastatin  Other (See Comments)    Muscle aches   Nexletol  [Bempedoic Acid ]     URI    Penicillins     Pt unsure what type allergy/ was a child when  had reaction Did it involve swelling of the face/tongue/throat, SOB, or low BP? Unknown Did it involve sudden or severe rash/hives, skin peeling, or any reaction on the inside of your mouth or nose? Unknown Did you need to seek medical attention at a hospital or doctor's office? Unknown When did it last happen?       If all above answers are "NO", may proceed with cephalosporin use.    Contrast Media [Iodinated Contrast Media] Hives   Flomax [Tamsulosin]    Gadolinium    Neomycin    Viagra [Sildenafil]    Clopidogrel Rash and Hives   Omeprazole  Palpitations   Repatha  [Evolocumab ] Anxiety    Trouble sleeping   Tape Rash    Tears skin Also ,EKG adhesive pads causes redness, rash and burning sensation at site     Family History  Problem Relation Age of Onset   Dementia Mother    Prostate cancer Father 57   Lung cancer Sister 60   Colon cancer Neg Hx    Colon polyps Neg Hx    Anesthesia problems Neg Hx    Hypotension Neg Hx    Malignant hyperthermia Neg Hx    Pseudochol deficiency Neg Hx    Esophageal cancer Neg Hx    Stomach cancer Neg Hx    Rectal cancer Neg Hx    Pancreatic cancer Neg Hx     Social History   Tobacco Use   Smoking status: Former    Current packs/day: 0.00    Average packs/day: 1 pack/day for 30.0 years (30.0 ttl pk-yrs)    Types: Cigarettes    Start date: 09/29/1977     Quit date: 09/30/2007    Years since quitting: 16.8   Smokeless tobacco: Never  Vaping Use   Vaping status: Never Used  Substance Use Topics   Alcohol use: No   Drug use: No    ROS: As per history of present illness, otherwise negative  BP 112/76   Pulse 65   Ht 6' (1.829 m)   Wt 180 lb 8 oz (81.9 kg)   SpO2 98%   BMI 24.48 kg/m  Gen: awake, alert, NAD HEENT: anicteric  CV: RRR, no mrg Pulm: CTA b/l Abd: soft, NT/ND, +BS throughout Ext: no c/c/e Neuro: nonfocal   RELEVANT LABS AND IMAGING: CBC    Component Value Date/Time   WBC 7.7 07/22/2024 1633   RBC 4.90 07/22/2024 1633   HGB 14.8 07/22/2024 1633   HGB 14.9 01/24/2018 0926   HCT 44.2 07/22/2024 1633   HCT 42.0 01/24/2018 0926   PLT 231 07/22/2024 1633   PLT 214 01/24/2018 0926   MCV 90.2 07/22/2024 1633   MCV 88 01/24/2018 0926   MCH 30.2 07/22/2024 1633   MCHC 33.5 07/22/2024 1633   RDW 13.7 07/22/2024 1633   RDW 13.5 01/24/2018 0926   LYMPHSABS 3.7 03/27/2021 2332   MONOABS 0.8 03/27/2021 2332   EOSABS 1.3 (H) 03/27/2021 2332   BASOSABS 0.0 03/27/2021 2332    CMP     Component Value Date/Time   NA 133 (L) 07/22/2024 1633   NA 138 10/10/2023 1024   K 3.9 07/22/2024 1633   CL 95 (L) 07/22/2024 1633   CO2 25 07/22/2024 1633   GLUCOSE 151 (H) 07/22/2024 1633   BUN 12 07/22/2024 1633   BUN 14 10/10/2023 1024   CREATININE 0.91 07/22/2024 1633   CREATININE 0.90 01/30/2016 1005   CALCIUM  9.2 07/22/2024 1633   PROT 6.3 04/19/2017 1021  ALBUMIN 4.3 04/19/2017 1021   AST 23 04/19/2017 1021   ALT 13 04/19/2017 1021   ALKPHOS 76 04/19/2017 1021   BILITOT 0.6 04/19/2017 1021   GFRNONAA >60 07/22/2024 1633   GFRAA >60 03/02/2020 1500    Results   DIAGNOSTIC Colonoscopy: Normal with diverticulosis (2021)      ASSESSMENT/PLAN:  Abdominal cramping/change in bowel movements with constipation/post-infectious irritable bowel syndrome Symptoms started after an acute viral versus bacterial  self-limited infection in April.  Most consistent with post-infectious IBS with constipation and altered bowel movements due to microbiome disruption from infection and possibly antibiotics. - VSL #3 probiotic twice daily for 4-6 weeks. - Miralax  17 g once daily. - Continue Metamucil daily. - Order [facility] pathogen panel. - Monitor symptoms and report via MyChart in 3-4 weeks.  Diverticulosis Without diverticulitis.  Heart palpitations Recent heart palpitations, he wonders about post-viral heart inflammation.   - He is encouraged to contact his cardiologist Dr. Vina Gull for evaluation. - Send note to Dr. Vina Gull regarding symptoms.  Colon cancer screening Colonoscopy without polyps in 2021, repeat would be 2031 based on overall health at that time    Rr:Hnoipwh, Norleen, Md [address] [place],  [place] [address]

## 2024-07-26 ENCOUNTER — Encounter: Payer: Self-pay | Admitting: Internal Medicine

## 2024-07-26 DIAGNOSIS — R194 Change in bowel habit: Secondary | ICD-10-CM | POA: Diagnosis not present

## 2024-07-26 DIAGNOSIS — R002 Palpitations: Secondary | ICD-10-CM

## 2024-07-28 ENCOUNTER — Encounter: Payer: Self-pay | Admitting: Internal Medicine

## 2024-07-28 NOTE — Progress Notes (Signed)
 Cardiology Office Note:  .   Date:  08/03/2024  ID:  [name] [name], DOB 1951/10/21, MRN [id number] PCP: Marvine Rush, MD   HeartCare Providers Cardiologist:  Vina Gull, MD    History of Present Illness: .   [name] [name] is a 73 y.o. male   with a history of CAD, HTN, PV disease, and HLD  History of CAD s/p NSTEMI in 2008 tx with PCI of bifurcational lesion in LAD/Dx with 3 DES. He has a hx of rash with Plavix and Ticlid.  He underwent Plavix desensitization in 2009. Intolerance to Statin and Repatha  (caused nightmares). Low risk stress test 09/2016.   The pt had limited episode of exertional CP in Aug 2020.  Short lived  F/U stress test low risk  No ischemia     Patient went to ED with palpitations 07/22/24. Benign EKG-NSR with PAC's, normal K and negative trop. No arrhythmias.Patient felt like it was skipping/stopping, his BP monitor wouldn't give him a reading. Lasted about 3 hrs. 3 days later it continued for 3-4 days before it settled down.  Hasn't had any for the past 5-6 days. He was working in the heat but drinking a lot of water  and gatorade. He wasn't sleeping well due to cialis 5 mg daily so he stopped it. He also changed his coffee from 1/2 caffeine  to 3/4 caffeine. Now back to 1/2 caffeine.  He also puts sugar in his coffee and drinks sweet tea. He started taking Magnesium supplements.   ROS:    Studies Reviewed: SABRA         Prior CV Studies:    MRI 02/2024 MPRESSION: 1.  Normal LV size and function septal thickness 10 mm LVEF 60%   2. Normal T1 nulling and no gadolinium uptake on delayed inversion recovery sequences No evidence of amyloid   3.  Normal RV size and function RVEF 54%   4.  Dilated ascending thoracic aorta 4.0 cm   5.  Normal cardiac valves   6. Normal parametric measures see values above ECV not consistent with amyloid   [name] [name]     Electronically Signed   By: [name] [name] M.D.   On: 02/14/2024 14:01    Risk  Assessment/Calculations:             Physical Exam:   VS:  BP 120/80 (BP Location: Left Arm, Patient Position: Sitting, Cuff Size: Normal)   Pulse 69   Ht 6' (1.829 m)   Wt 181 lb (82.1 kg)   BMI 24.55 kg/m    Orhtostatics: No data found. Wt Readings from Last 3 Encounters:  08/03/24 181 lb (82.1 kg)  07/24/24 180 lb 8 oz (81.9 kg)  02/26/24 180 lb 9.6 oz (81.9 kg)    GEN: Well nourished, well developed in no acute distress NECK: No JVD; No carotid bruits CARDIAC:  RRR, 1/6 systolic murmur LSB RESPIRATORY:  Clear to auscultation without rales, wheezing or rhonchi  ABDOMEN: Soft, non-tender, non-distended EXTREMITIES:  No edema; No deformity   ASSESSMENT AND PLAN: .    CAD  Remote intervention  Myoview  in April 2024, Normal     Palpitations-bothersome for about 3 days. Was working in the heat and also increased his caffeine intake. Now has cut back on it. Monitor in the mail for him. K ok. F/u thyroid  with PCP.   HLD    -  Intolerant to Praluent     -On pravstatin and Zetia , leqvio  -overdue for  FLP  and lip(a)-will order      HTN  BP is well controlled, blood work ok in ED.  DM   A1C is 6.5   - Cut out sugars,carbs. -Increase activity                Dispo: f/u Dr. Okey 1 yr  Signed, Olivia Pavy, PA-C

## 2024-07-29 NOTE — Telephone Encounter (Signed)
 This encounter was created in error - please disregard.

## 2024-07-29 NOTE — Telephone Encounter (Signed)
 Set pt up for 2 wk Zio patch

## 2024-07-29 NOTE — Telephone Encounter (Signed)
Note sent to Dr. Hilarie Fredrickson.

## 2024-07-30 ENCOUNTER — Ambulatory Visit: Attending: Internal Medicine

## 2024-07-30 ENCOUNTER — Telehealth: Payer: Self-pay

## 2024-07-30 DIAGNOSIS — R002 Palpitations: Secondary | ICD-10-CM

## 2024-07-30 NOTE — Telephone Encounter (Signed)
 Received a fax from [facility] lab with GI panel with C. Diff and H. Pylori results. Called [facility] laboratory and explained to [facility] representative [facility]) that our office only ordered a GI panel with C. Diff on the requisition and not a H. Pylori test. Explained to [facility] I want to make sure the patient does not get billed for this test since this test was ordered in error. Misty assured me the patient will not get billed for this extra test.  The faxed results from [facility] lab state that the GI panel with C. Diff is not detected (negative). Hard copy put on Dr. Pamula desk for review.

## 2024-07-30 NOTE — Progress Notes (Unsigned)
 Enrolled for [employer] to mail a ZIO XT long term holter monitor to the patients address on file.

## 2024-08-03 ENCOUNTER — Ambulatory Visit: Attending: Physician Assistant | Admitting: Physician Assistant

## 2024-08-03 ENCOUNTER — Encounter: Payer: Self-pay | Admitting: Physician Assistant

## 2024-08-03 VITALS — BP 120/80 | HR 69 | Ht 72.0 in | Wt 181.0 lb

## 2024-08-03 DIAGNOSIS — R002 Palpitations: Secondary | ICD-10-CM | POA: Diagnosis not present

## 2024-08-03 DIAGNOSIS — Z79899 Other long term (current) drug therapy: Secondary | ICD-10-CM | POA: Diagnosis not present

## 2024-08-03 DIAGNOSIS — I2510 Atherosclerotic heart disease of native coronary artery without angina pectoris: Secondary | ICD-10-CM | POA: Insufficient documentation

## 2024-08-03 DIAGNOSIS — E785 Hyperlipidemia, unspecified: Secondary | ICD-10-CM | POA: Insufficient documentation

## 2024-08-03 DIAGNOSIS — I739 Peripheral vascular disease, unspecified: Secondary | ICD-10-CM | POA: Diagnosis not present

## 2024-08-03 NOTE — Patient Instructions (Signed)
 Medication Instructions:  Your physician recommends that you continue on your current medications as directed. Please refer to the Current Medication list given to you today.   *If you need a refill on your cardiac medications before your next appointment, please call your pharmacy*  Lab Work: IN THE NEXT FEW DAYS, GO TO [place] FOR LABS, MAKE SURE TO FAST:  LIPID & LPa  If you have labs (blood work) drawn today and your tests are completely normal, you will receive your results only by: MyChart Message (if you have MyChart) OR A paper copy in the mail If you have any lab test that is abnormal or we need to change your treatment, we will call you to review the results.  Testing/Procedures: None ordered  Follow-Up: At [facility], you and your health needs are our priority.  As part of our continuing mission to provide you with exceptional heart care, our providers are all part of one team.  This team includes your primary Cardiologist (physician) and Advanced Practice Providers or APPs (Physician Assistants and Nurse Practitioners) who all work together to provide you with the care you need, when you need it.  Your next appointment:   1 year(s)  Provider:   Vina Gull, MD    We recommend signing up for the patient portal called MyChart.  Sign up information is provided on this After Visit Summary.  MyChart is used to connect with patients for Virtual Visits (Telemedicine).  Patients are able to view lab/test results, encounter notes, upcoming appointments, etc.  Non-urgent messages can be sent to your provider as well.   To learn more about what you can do with MyChart, go to [email or web address].   Other Instructions Cut back on your SUGAR INTAKE

## 2024-08-03 NOTE — Telephone Encounter (Signed)
 Informed patient that the infectious stool test is negative and this likely supports the diagnosis of postinfectious IBS. Informed patient to continue the VSL #3 for a total of 4 weeks and to let our office know if his symptoms fail to improve or get back to normal. Patient verbalized understanding.

## 2024-08-03 NOTE — Telephone Encounter (Signed)
 Please let patient know that the infectious stool test is negative This also likely supports the diagnosis of postinfectious irritable bowel He should continue the VSL #3 for a total of 4 weeks and let us  know if his symptoms fail to return to normal We can pursue further workup if symptoms persist

## 2024-08-04 ENCOUNTER — Other Ambulatory Visit (HOSPITAL_COMMUNITY)
Admission: RE | Admit: 2024-08-04 | Discharge: 2024-08-04 | Disposition: A | Discharge status: Home / Self Care | Source: Ambulatory Visit | Attending: Physician Assistant | Admitting: Physician Assistant

## 2024-08-04 DIAGNOSIS — I2510 Atherosclerotic heart disease of native coronary artery without angina pectoris: Secondary | ICD-10-CM | POA: Insufficient documentation

## 2024-08-04 LAB — LIPID PANEL
Cholesterol: 92 mg/dL (ref 0–200)
HDL: 39 mg/dL — ABNORMAL LOW (ref >40)
LDL Cholesterol: 41 mg/dL (ref 0–99)
Total CHOL/HDL Ratio: 2.4 ratio
Triglycerides: 61 mg/dL (ref <150)
VLDL: 12 mg/dL (ref 0–40)

## 2024-08-06 ENCOUNTER — Ambulatory Visit: Payer: Self-pay | Admitting: Physician Assistant

## 2024-08-06 DIAGNOSIS — E785 Hyperlipidemia, unspecified: Secondary | ICD-10-CM

## 2024-08-06 DIAGNOSIS — Z79899 Other long term (current) drug therapy: Secondary | ICD-10-CM

## 2024-08-06 LAB — LIPOPROTEIN A (LPA): Lipoprotein (a): 159.8 nmol/L — ABNORMAL HIGH (ref <75.0)

## 2024-08-15 ENCOUNTER — Ambulatory Visit
Admission: EM | Admit: 2024-08-15 | Discharge: 2024-08-15 | Disposition: A | Discharge status: Home / Self Care | Attending: Nurse Practitioner | Admitting: Nurse Practitioner

## 2024-08-15 ENCOUNTER — Encounter: Payer: Self-pay | Admitting: Emergency Medicine

## 2024-08-15 ENCOUNTER — Ambulatory Visit (INDEPENDENT_AMBULATORY_CARE_PROVIDER_SITE_OTHER)

## 2024-08-15 DIAGNOSIS — R059 Cough, unspecified: Secondary | ICD-10-CM | POA: Diagnosis not present

## 2024-08-15 DIAGNOSIS — Z981 Arthrodesis status: Secondary | ICD-10-CM | POA: Diagnosis not present

## 2024-08-15 DIAGNOSIS — J22 Unspecified acute lower respiratory infection: Secondary | ICD-10-CM

## 2024-08-15 DIAGNOSIS — R058 Other specified cough: Secondary | ICD-10-CM | POA: Diagnosis not present

## 2024-08-15 DIAGNOSIS — I700 Atherosclerosis of aorta: Secondary | ICD-10-CM | POA: Diagnosis not present

## 2024-08-15 MED ORDER — PREDNISONE 20 MG PO TABS: 40.0000 mg | ORAL_TABLET | Freq: Every day | ORAL | 0 refills | Status: AC

## 2024-08-15 MED ORDER — DOXYCYCLINE HYCLATE 100 MG PO TABS: 100.0000 mg | ORAL_TABLET | Freq: Two times a day (BID) | ORAL | 0 refills | Status: AC

## 2024-08-15 MED ORDER — ALBUTEROL SULFATE HFA 108 (90 BASE) MCG/ACT IN AERS: 2.0000 | INHALATION_SPRAY | Freq: Four times a day (QID) | RESPIRATORY_TRACT | 0 refills | Status: DC | PRN

## 2024-08-15 NOTE — Discharge Instructions (Signed)
 Your chest x-ray was negative for pneumonia. Take medication as prescribed. Increase fluids and allow for plenty of rest. Recommend using a humidifier in your bedroom at nighttime during sleep and sleeping elevated on pillows while cough symptoms persist. Please be advised that your cough may last from days to weeks.  If you are generally feeling well but continued to have a persistent nagging cough, continue to drink plenty of fluids, use over-the-counter cough and cold medications, and use cough drops.  If you develop new symptoms such as fever, chills, wheezing, shortness of breath, seek care. If symptoms fail to improve with this treatment, you may follow-up in this clinic or with your primary care physician for further evaluation. Follow-up as needed.

## 2024-08-15 NOTE — ED Triage Notes (Signed)
 Productive Cough x 1 month.  Has been taking mucinex without relief.   Patient is currently wearing a heart monitor for having heart palpitations

## 2024-08-15 NOTE — ED Provider Notes (Signed)
 [facility]    CSN: [id number] Arrival date & time: 08/15/24  1230      History   Chief Complaint No chief complaint on file.   HPI [name] [name] is a 73 y.o. male.   The history is provided by the patient.   Patient presents for complaints of cough that been present for the past month.  Patient states symptoms started as a dry cough.  He states over the past several days, the cough has become productive.  Patient denies fever, chills, chest pain, abdominal pain, wheezing, shortness of breath, or difficulty breathing.  Patient reports that the cough is worse at night, states that it is keeping him from his sleep.  States he has tried several over-the-counter medications with minimal relief of his symptoms.  Patient also endorses history of seasonal allergies. Past Medical History:  Diagnosis Date   Allergy    Anxiety    Arthritis    back pain   Avascular necrosis of bone of hip (HCC)    right   CAD (coronary artery disease)    stents x3 2008   GERD (gastroesophageal reflux disease)    History of nuclear stress test    a. Myoview  10/17: EF 56%, diaphragmatic attenuation, no ischemia, low risk   HTN (hypertension)    Hyperlipidemia    Myocardial infarction (HCC)    2008- stents x 3    Paresthesias    Sleep apnea    no cpap    Thyroid  disease     Patient Active Problem List   Diagnosis Date Noted   Localized cutaneous nodular amyloidosis (HCC) 12/04/2023   Mass of right wrist 11/21/2023   Benign lipomatous neoplasm of skin and subcutaneous tissue of left leg 07/25/2023   Peripheral arterial disease (HCC) 05/01/2023   Hymenoptera allergy 10/23/2022   Rash and other nonspecific skin eruption 10/23/2022   Elevated blood pressure reading 10/23/2022   Anxiety 02/27/2022   Back pain 02/27/2022   Lipoma of right upper extremity 02/27/2022   Benign prostatic hyperplasia with lower urinary tract symptoms 02/27/2022   Generalized anxiety disorder 02/27/2022    Hypertensive retinopathy, bilateral 02/27/2022   Impacted cerumen, bilateral 02/27/2022   Insomnia, unspecified 02/27/2022   Male erectile dysfunction, unspecified 02/27/2022   Mixed conductive and sensorineural hearing loss, unilateral, left ear with restricted hearing on the contralateral side 02/27/2022   Myopathy, unspecified 02/27/2022   Obstructive sleep apnea (adult) (pediatric) 02/27/2022   Radiculopathy due to cervical spondylosis 02/27/2022   Rhinitis 02/27/2022   Unspecified cataract 02/27/2022   Aftercare following surgery 01/23/2021   S/P cervical spinal fusion 01/23/2021   Abnormality of esophagus 01/27/2020   Colon cancer screening 01/27/2020   Trigger index finger of left hand 01/06/2020   Acquired trigger finger of left index finger 05/26/2019   Esophageal dysphagia 02/08/2016   Hemorrhoids 02/08/2016   Chronic radicular cervical pain 07/29/2014   GERD (gastroesophageal reflux disease) 10/14/2012   Constipation 10/14/2012   Stiffness of joint, not elsewhere classified, other specified site 02/21/2012   PUD (peptic ulcer disease) 12/21/2011   Dysphagia 07/25/2011   Hyperlipidemia 08/12/2009   ESSENTIAL HYPERTENSION, BENIGN 08/12/2009   CAD, NATIVE VESSEL 08/12/2009   Hypothyroidism 06/11/2009   Dermatitis due to drug taken internally 01/09/2008   LOCALIZED SUPERFICIAL SWELLING MASS OR LUMP 01/09/2008    Past Surgical History:  Procedure Laterality Date   CARDIAC CATHETERIZATION     3 stents placed   COLONOSCOPY  2011 NUR [facility] SCREENING d50 v5  MILD [place] TICS, ? PREP ARTIFACT V. PROCTITIS-rX:[place]. next TCS 2021.   ESOPHAGEAL DILATION N/A 03/02/2016   Procedure: ESOPHAGEAL DILATION;  Surgeon: Margo LITTIE Haddock, MD;  [facility];  Service: Endoscopy;  Laterality: N/A;   ESOPHAGOGASTRODUODENOSCOPY  10/03/11   small hiatal hernia/gastric ulcers/stricutre in distal esophagus   ESOPHAGOGASTRODUODENOSCOPY N/A 03/02/2016   Dr. Haddock: Schatzki's ring at GE  junction s/p dilation, mild non-erosive gastritis/duodenitis    ESOPHAGOGASTRODUODENOSCOPY (EGD) WITH PROPOFOL  N/A 02/09/2020   Dr. Haddock: Low-grade narrowing Schatzki ring status post dilation, moderate gastritis, no biopsies taken   MASS EXCISION Left 09/30/2014   Procedure: EXCISION MASS LEFT WRIST ;  Surgeon: Arley Curia, MD;  Location: [facility];  Service: Orthopedics;  Laterality: Left;   ROTATOR CUFF REPAIR Left    [facility]    SAVORY DILATION N/A 02/09/2020   Procedure: SAVORY DILATION;  Surgeon: Haddock Margo LITTIE, MD;  [facility];  Service: Endoscopy;  Laterality: N/A;   TONSILLECTOMY  age 63   TOTAL HIP ARTHROPLASTY  age 45   right hip for avascular necrosis of hip and spine   TRIGGER FINGER RELEASE Left 03/08/2020   Procedure: RELEASE TRIGGER FINGER/A-1 PULLEY LEFT INDEX FINGER;  Surgeon: Curia Arley, MD;  Location: [facility];  Service: Orthopedics;  Laterality: Left;  IV REGIONAL FOREARM BLOCK   UPPER GASTROINTESTINAL ENDOSCOPY     with dilation - last 02-2020        Home Medications    Prior to Admission medications   Medication Sig Start Date End Date Taking? Authorizing Provider  albuterol  (VENTOLIN  HFA) 108 (90 Base) MCG/ACT inhaler Inhale 2 puffs into the lungs every 6 (six) hours as needed. 08/15/24  Yes Leath-Warren, Etta PARAS, NP  doxycycline  (VIBRA -TABS) 100 MG tablet Take 1 tablet (100 mg total) by mouth 2 (two) times daily for 7 days. 08/15/24 08/22/24 Yes Leath-Warren, Etta PARAS, NP  predniSONE  (DELTASONE ) 20 MG tablet Take 2 tablets (40 mg total) by mouth daily with breakfast for 5 days. 08/15/24 08/20/24 Yes Leath-Warren, Etta PARAS, NP  acetaminophen  (TYLENOL ) 325 MG tablet Take 650 mg by mouth daily as needed (pain).    [provider]  aspirin  EC 81 MG tablet Take 1 tablet (81 mg total) by mouth daily. 01/24/18   Okey Vina GAILS, MD  calcium  carbonate (TUMS EX) 750 MG chewable tablet Chew 1 tablet by mouth 2 (two) times  daily as needed for heartburn.     [provider]  carvedilol  (COREG ) 6.25 MG tablet Take 0.5 tablets by mouth in the morning and at bedtime. 08/17/21   [provider]  desloratadine (CLARINEX) 5 MG tablet Take 5 mg by mouth daily. 12/03/22   [provider]  diphenhydrAMINE  (BENADRYL ) 50 MG tablet Take one tablet (50 mg) by mouth 1 hour prior to your cardiac MRI. 02/12/24   Okey Vina GAILS, MD  EPINEPHrine  0.3 mg/0.3 mL IJ SOAJ injection Inject 0.3 mg into the muscle as needed for anaphylaxis. 03/23/21   Haze Lonni PARAS, MD  ezetimibe  (ZETIA ) 10 MG tablet Take 0.5 tablets (5 mg total) by mouth daily. 04/03/22   Ross, Paula V, MD  hydrochlorothiazide (HYDRODIURIL) 25 MG tablet Take 12.5 mg by mouth 2 (two) times daily.    [provider]  inclisiran (LEQVIO ) 284 MG/1.5ML SOSY injection Inject 1.5 mLs (284 mg total) into the skin once for 1 dose. 10/30/23   Okey Vina GAILS, MD  levothyroxine (SYNTHROID, LEVOTHROID) 75 MCG tablet  Take 75 mcg by mouth daily before breakfast.    [provider]  losartan  (COZAAR ) 100 MG tablet Take 50 mg by mouth 2 (two) times daily. 07/09/19   [provider]  omeprazole  (PRILOSEC) 20 MG capsule Take 20 mg by mouth daily. 10/13/22   [provider]  ondansetron  (ZOFRAN -ODT) 4 MG disintegrating tablet Take 1 tablet (4 mg total) by mouth every 8 (eight) hours as needed for nausea or vomiting. 04/18/24   Stuart Vernell Norris, PA-C  pravastatin  (PRAVACHOL ) 40 MG tablet Take 20 mg by mouth 2 (two) times daily.    [provider]  pregabalin (LYRICA) 25 MG capsule Take 25 mg by mouth daily. 04/01/23   [provider]  psyllium (METAMUCIL) 58.6 % powder Take 1 packet by mouth daily.    [provider]  triamcinolone  cream (KENALOG ) 0.1 % Apply 1 application topically 2 (two) times daily as needed (as directed).    [provider]  metoprolol  tartrate (LOPRESSOR ) 25 MG tablet Take 1  tablet (25 mg total) by mouth 2 (two) times daily as needed (palpitations). 03/28/21 03/28/21  Haze Lonni PARAS, MD    Family History Family History  Problem Relation Age of Onset   Dementia Mother    Prostate cancer Father 35   Lung cancer Sister 1   Colon cancer Neg Hx    Colon polyps Neg Hx    Anesthesia problems Neg Hx    Hypotension Neg Hx    Malignant hyperthermia Neg Hx    Pseudochol deficiency Neg Hx    Esophageal cancer Neg Hx    Stomach cancer Neg Hx    Rectal cancer Neg Hx    Pancreatic cancer Neg Hx     Social History Social History   Tobacco Use   Smoking status: Former    Current packs/day: 0.00    Average packs/day: 1 pack/day for 30.0 years (30.0 ttl pk-yrs)    Types: Cigarettes    Start date: 09/29/1977    Quit date: 09/30/2007    Years since quitting: 16.8   Smokeless tobacco: Never  Vaping Use   Vaping status: Never Used  Substance Use Topics   Alcohol use: No   Drug use: No     Allergies   Atorvastatin , Bee venom, Clopidogrel bisulfate, Rosuvastatin , Nexletol  [bempedoic acid ], Penicillins, Contrast media [iodinated contrast media], Flomax [tamsulosin], Gadolinium, Neomycin, Viagra [sildenafil], Clopidogrel, Omeprazole , Repatha  [evolocumab ], and Tape   Review of Systems Review of Systems Per HPI  Physical Exam Triage Vital Signs ED Triage Vitals  Encounter Vitals Group     BP 08/15/24 1322 127/76     Girls Systolic BP Percentile --      Girls Diastolic BP Percentile --      Boys Systolic BP Percentile --      Boys Diastolic BP Percentile --      Pulse Rate 08/15/24 1322 67     Resp 08/15/24 1322 18     Temp 08/15/24 1322 98.1 F (36.7 C)     Temp Source 08/15/24 1322 Oral     SpO2 08/15/24 1322 97 %     Weight --      Height --      Head Circumference --      Peak Flow --      Pain Score 08/15/24 1323 3     Pain Loc --      Pain Education --      Exclude from Growth Chart --  No data found.  Updated Vital Signs BP  127/76 (BP Location: Right Arm)   Pulse 67   Temp 98.1 F (36.7 C) (Oral)   Resp 18   SpO2 97%   Visual Acuity Right Eye Distance:   Left Eye Distance:   Bilateral Distance:    Right Eye Near:   Left Eye Near:    Bilateral Near:     Physical Exam Vitals and nursing note reviewed.  Constitutional:      General: He is not in acute distress. HENT:     Head: Normocephalic.     Right Ear: Tympanic membrane, ear canal and external ear normal.     Left Ear: Tympanic membrane, ear canal and external ear normal.     Mouth/Throat:     Lips: Pink.     Mouth: Mucous membranes are moist.     Pharynx: Postnasal drip present. No pharyngeal swelling, oropharyngeal exudate, posterior oropharyngeal erythema or uvula swelling.     Comments: Cobblestoning present to posterior oropharynx  Eyes:     Extraocular Movements: Extraocular movements intact.     Pupils: Pupils are equal, round, and reactive to light.  Cardiovascular:     Rate and Rhythm: Normal rate and regular rhythm.     Pulses: Normal pulses.     Heart sounds: Normal heart sounds.  Pulmonary:     Effort: Pulmonary effort is normal. No respiratory distress.     Breath sounds: Normal breath sounds. No stridor. No wheezing, rhonchi or rales.  Abdominal:     General: Bowel sounds are normal.     Palpations: Abdomen is soft.     Tenderness: There is no abdominal tenderness.  Musculoskeletal:     Cervical back: Normal range of motion.  Skin:    General: Skin is dry.  Neurological:     General: No focal deficit present.     Mental Status: He is alert and oriented to person, place, and time.  Psychiatric:        Mood and Affect: Mood normal.        Behavior: Behavior normal.      UC Treatments / Results  Labs (all labs ordered are listed, but only abnormal results are displayed) Labs Reviewed - No data to display  EKG   Radiology DG Chest 2 View Result Date: 08/15/2024 CLINICAL DATA:  Productive cough for 1 month.  EXAM: CHEST - 2 VIEW COMPARISON:  07/22/2024. FINDINGS: The heart is normal in size and the mediastinal contour is within normal limits. There is atherosclerotic calcification of the aorta. Mild hyperinflation of the lungs is noted. No consolidation, effusion, or pneumothorax is seen. Cervical spinal fusion hardware is noted. No acute osseous abnormality IMPRESSION: Hyperinflation of the lungs with no active cardiopulmonary disease. Electronically Signed   By: Leita Birmingham M.D.   On: 08/15/2024 13:59    Procedures Procedures (including critical care time)  Medications Ordered in UC Medications - No data to display  Initial Impression / Assessment and Plan / UC Course  I have reviewed the triage vital signs and the nursing notes.  Pertinent labs & imaging results that were available during my care of the patient were reviewed by me and considered in my medical decision making (see chart for details).  Chest x-ray was negative for pneumonia.  On exam, the patient is well-appearing, he is in no acute distress, vital signs are stable.  Room air sats at 97%.  Patient with cough for the past month.  Will  provide empiric treatment for lower respiratory infection with doxycycline  100 mg.  Prednisone  40 mg also prescribed for bronchial inflammation.  An albuterol  inhaler was prescribed to help with cough will have patient continue over-the-counter cough and cold medications at this time.  Supportive care recommendations were provided and discussed with the patient to include fluids, rest, over-the-counter Tylenol , and use of a humidifier during sleep.  Discussed indications with patient regarding follow-up.  Patient was in agreement with this plan of care and verbalizes understanding.  All questions were answered.  Patient stable for discharge.   Final Clinical Impressions(s) / UC Diagnoses   Final diagnoses:  Cough, unspecified type  Lower respiratory infection     Discharge Instructions       Your chest x-ray was negative for pneumonia. Take medication as prescribed. Increase fluids and allow for plenty of rest. Recommend using a humidifier in your bedroom at nighttime during sleep and sleeping elevated on pillows while cough symptoms persist. Please be advised that your cough may last from days to weeks.  If you are generally feeling well but continued to have a persistent nagging cough, continue to drink plenty of fluids, use over-the-counter cough and cold medications, and use cough drops.  If you develop new symptoms such as fever, chills, wheezing, shortness of breath, seek care. If symptoms fail to improve with this treatment, you may follow-up in this clinic or with your primary care physician for further evaluation. Follow-up as needed.     ED Prescriptions     Medication Sig Dispense Auth. Provider   doxycycline  (VIBRA -TABS) 100 MG tablet Take 1 tablet (100 mg total) by mouth 2 (two) times daily for 7 days. 14 tablet Leath-Warren, Etta PARAS, NP   predniSONE  (DELTASONE ) 20 MG tablet Take 2 tablets (40 mg total) by mouth daily with breakfast for 5 days. 10 tablet Leath-Warren, Etta PARAS, NP   albuterol  (VENTOLIN  HFA) 108 (90 Base) MCG/ACT inhaler Inhale 2 puffs into the lungs every 6 (six) hours as needed. 8 g Leath-Warren, Etta PARAS, NP      PDMP not reviewed this encounter.   Gilmer Etta PARAS, NP 08/15/24 1415

## 2024-08-18 ENCOUNTER — Ambulatory Visit: Admitting: Student

## 2024-08-24 ENCOUNTER — Ambulatory Visit: Payer: Self-pay | Admitting: Internal Medicine

## 2024-08-24 DIAGNOSIS — R002 Palpitations: Secondary | ICD-10-CM | POA: Diagnosis not present

## 2024-08-26 ENCOUNTER — Ambulatory Visit: Payer: Federal, State, Local not specified - PPO

## 2024-08-26 VITALS — BP 118/83 | HR 60 | Temp 97.9°F | Resp 20 | Ht 72.0 in | Wt 180.6 lb

## 2024-08-26 DIAGNOSIS — R3914 Feeling of incomplete bladder emptying: Secondary | ICD-10-CM | POA: Diagnosis not present

## 2024-08-26 DIAGNOSIS — N5201 Erectile dysfunction due to arterial insufficiency: Secondary | ICD-10-CM | POA: Diagnosis not present

## 2024-08-26 DIAGNOSIS — N401 Enlarged prostate with lower urinary tract symptoms: Secondary | ICD-10-CM | POA: Diagnosis not present

## 2024-08-26 DIAGNOSIS — E7849 Other hyperlipidemia: Secondary | ICD-10-CM

## 2024-08-26 DIAGNOSIS — I2510 Atherosclerotic heart disease of native coronary artery without angina pectoris: Secondary | ICD-10-CM

## 2024-08-26 MED ORDER — INCLISIRAN SODIUM 284 MG/1.5ML ~~LOC~~ SOSY
284.0000 mg | PREFILLED_SYRINGE | Freq: Once | SUBCUTANEOUS | Status: AC
  Administered 2024-08-26: 284 mg via SUBCUTANEOUS
  Filled 2024-08-26: qty 1.5

## 2024-08-26 NOTE — Progress Notes (Signed)
 Diagnosis: Hyperlipidemia  Provider:  Chilton Greathouse MD  Procedure: Injection  Leqvio (inclisiran), Dose: 284 mg, Site: subcutaneous, Number of injections: 1  Injection Site(s): Left arm  Post Care:  left arm injection  Discharge: Condition: Good, Destination: Home . AVS Provided  Performed by:  Rico Ala, LPN

## 2024-09-14 ENCOUNTER — Encounter: Payer: Self-pay | Admitting: Internal Medicine

## 2024-09-21 ENCOUNTER — Ambulatory Visit: Attending: Cardiovascular Disease | Admitting: Pharmacist

## 2024-09-21 DIAGNOSIS — E785 Hyperlipidemia, unspecified: Secondary | ICD-10-CM | POA: Insufficient documentation

## 2024-09-21 NOTE — Progress Notes (Signed)
 Patient ID: [name] [name]                 DOB: [birth year]                    MRN: [id number]      HPI: [name] [name] is a 73 y.o. male patient referred to lipid clinic by  Olivia Fryer, PA. PMH is significant for f CAD /p NSTEMI in 2008 tx with PCI of bifurcational lesion in LAD/Dx with 3 DES , HTN, PV disease, and HLD.  Patient has a past medical history significant for intolerances to various statins and Repatha .  He was started on Leqvio  in November.  Also on pravastatin  40 mg and ezetimibe  5 mg.  He was referred to lipid clinic due to increase in high from 111 to 159 nmol/L.  Patient presents today to lipid clinic.  He reports doing very well on Leqvio  happy with the results of his LDL cholesterol.  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  We discussed a change in his LP(a).  Discussed that Leqvio  and ezetimibe  can lower LP(a), but and they do not always.  We also do not know if the small reduction sometimes seen would be clinically relevant.  Despite the increase in LP(a), still feel strongly that Leqvio  is providing a benefit.  Since we do not have targeted treatments for LP(a) at this time the best route of action is to lower LDL-C as much as possible.  Patient's LDL cholesterol is well-controlled at 41.  There are no secondary LP(a) trials enrolling currently.  Generally the enrollment for these trials require an LP(a) above 175 or 200, therefore patient would not qualify.  Current Medications: Leqvio  184 mg every 6 months, pravastatin  40 mg daily, ezetimibe  5 mg daily Intolerances:  Statin (myalgias) and Repatha  (caused insomina/nightmares)  Risk Factors: CAD status postnon-STEMI, hypertension, elevated LP(a), PV disease LDL-C goal: Less than 70, but certainly would consider less than 55 ApoB goal:   Diet: [employer], salmon, vegetables  Exercise: works on Dance movement psychotherapist, walks trails  Family History:  Family History   Problem Relation Age of Onset   Dementia Mother    Prostate cancer Father 56   Lung cancer Sister 25   Colon cancer Neg Hx    Colon polyps Neg Hx    Anesthesia problems Neg Hx    Hypotension Neg Hx    Malignant hyperthermia Neg Hx    Pseudochol deficiency Neg Hx    Esophageal cancer Neg Hx    Stomach cancer Neg Hx    Rectal cancer Neg Hx    Pancreatic cancer Neg Hx      Social History:  Social History   Socioeconomic History   Marital status: Single    Spouse name: Not on file   Number of children: Not on file   Years of education: Not on file   Highest education level: Not on file  Occupational History   Not on file  Tobacco Use   Smoking status: Former    Current packs/day: 0.00    Average packs/day: 1 pack/day for 30.0 years (30.0 ttl pk-yrs)    Types: Cigarettes    Start date: 09/29/1977    Quit date: 09/30/2007    Years since quitting: 16.9   Smokeless tobacco: Never  Vaping Use   Vaping status: Never Used  Substance and Sexual Activity   Alcohol use: No  Drug use: No   Sexual activity: Yes  Other Topics Concern   Not on file  Social History Narrative   Not on file   Social Drivers of Health   Financial Resource Strain: Low Risk  (11/27/2023)   Received [facility]   Overall Financial Resource Strain (CARDIA)    Difficulty of Paying Living Expenses: Not hard at all  Food Insecurity: No Food Insecurity (11/27/2023)   Received from [facility]   Hunger Vital Sign    Within the past 12 months, you worried that your food would run out before you got the money to buy more.: Never true    Within the past 12 months, the food you bought just didn't last and you didn't have money to get more.: Never true  Transportation Needs: No Transportation Needs (11/27/2023)   Received from [facility] - Transportation    Lack of Transportation (Medical): No    Lack of Transportation (Non-Medical): No  Physical Activity: Not on file  Stress: Not  on file  Social Connections: Unknown (05/11/2022)   Received from [facility]   Social Network    Social Network: Not on file  Intimate Partner Violence: Unknown (04/19/2022)   Received from [facility]   HITS    Physically Hurt: Not on file    Insult or Talk Down To: Not on file    Threaten Physical Harm: Not on file    Scream or Curse: Not on file     Labs: Lipid Panel     Component Value Date/Time   CHOL 92 08/04/2024 1210   CHOL 144 01/04/2022 1059   TRIG 61 08/04/2024 1210   TRIG 107 08/15/2009 1335   HDL 39 (L) 08/04/2024 1210   HDL 35 (L) 01/04/2022 1059   CHOLHDL 2.4 08/04/2024 1210   VLDL 12 08/04/2024 1210   LDLCALC 41 08/04/2024 1210   LDLCALC 93 01/04/2022 1059   LDLCALC 87 08/15/2009 1335   LABVLDL 16 01/04/2022 1059    Past Medical History:  Diagnosis Date   Allergy    Anxiety    Arthritis    back pain   Avascular necrosis of bone of hip (HCC)    right   CAD (coronary artery disease)    stents x3 2008   GERD (gastroesophageal reflux disease)    History of nuclear stress test    a. Myoview  10/17: EF 56%, diaphragmatic attenuation, no ischemia, low risk   HTN (hypertension)    Hyperlipidemia    Myocardial infarction (HCC)    2008- stents x 3    Paresthesias    Sleep apnea    no cpap    Thyroid  disease     Current Outpatient Medications on File Prior to Visit  Medication Sig Dispense Refill   aspirin  EC 81 MG tablet Take 1 tablet (81 mg total) by mouth daily. 90 tablet 3   calcium  carbonate (TUMS EX) 750 MG chewable tablet Chew 1 tablet by mouth 2 (two) times daily as needed for heartburn.      carvedilol  (COREG ) 6.25 MG tablet Take 0.5 tablets by mouth in the morning and at bedtime.     desloratadine (CLARINEX) 5 MG tablet Take 5 mg by mouth daily.     EPINEPHrine  0.3 mg/0.3 mL IJ SOAJ injection Inject 0.3 mg into the muscle as needed for anaphylaxis. 1 each 0   ezetimibe  (ZETIA ) 10 MG tablet Take 0.5 tablets (5 mg total) by mouth daily.  hydrochlorothiazide (HYDRODIURIL) 25 MG tablet Take 12.5 mg by mouth 2 (two) times daily.     inclisiran (LEQVIO ) 284 MG/1.5ML SOSY injection Inject 1.5 mLs (284 mg total) into the skin once for 1 dose.     losartan  (COZAAR ) 100 MG tablet Take 50 mg by mouth 2 (two) times daily.     omeprazole  (PRILOSEC) 20 MG capsule Take 20 mg by mouth daily.     pravastatin  (PRAVACHOL ) 40 MG tablet Take 20 mg by mouth 2 (two) times daily.     pregabalin (LYRICA) 25 MG capsule Take 25 mg by mouth daily.     psyllium (METAMUCIL) 58.6 % powder Take 1 packet by mouth daily.     acetaminophen  (TYLENOL ) 325 MG tablet Take 650 mg by mouth daily as needed (pain).     levothyroxine (SYNTHROID, LEVOTHROID) 75 MCG tablet Take 75 mcg by mouth daily before breakfast.     triamcinolone  cream (KENALOG ) 0.1 % Apply 1 application topically 2 (two) times daily as needed (as directed).     [DISCONTINUED] metoprolol  tartrate (LOPRESSOR ) 25 MG tablet Take 1 tablet (25 mg total) by mouth 2 (two) times daily as needed (palpitations). 30 tablet 0   No current facility-administered medications on file prior to visit.    Allergies  Allergen Reactions   Atorvastatin  Other (See Comments)    Muscle aches   Bee Venom Anaphylaxis, Hives and Swelling   Clopidogrel Bisulfate Hives   Rosuvastatin  Other (See Comments)    Muscle aches   Nexletol  [Bempedoic Acid ]     URI    Penicillins     Pt unsure what type allergy/ was a child when had reaction Did it involve swelling of the face/tongue/throat, SOB, or low BP? Unknown Did it involve sudden or severe rash/hives, skin peeling, or any reaction on the inside of your mouth or nose? Unknown Did you need to seek medical attention at a hospital or doctor's office? Unknown When did it last happen?       If all above answers are "NO", may proceed with cephalosporin use.    Aurothiomalic Acid Disodium [Gold Sodium Thiomalate]    Contrast Media [Iodinated Contrast Media] Hives   Flomax  [Tamsulosin]    Gadolinium    Neomycin    Viagra [Sildenafil]    Clopidogrel Rash and Hives   Omeprazole  Palpitations   Repatha  [Evolocumab ] Anxiety    Trouble sleeping   Tape Rash    Tears skin Also ,EKG adhesive pads causes redness, rash and burning sensation at site     Assessment/Plan:  1. Hyperlipidemia -  Hyperlipidemia Assessment: LDL-C is well-controlled at 41 LP(a) increased from 111 to 159 Unfortunately no targeted treatment for LP(a) available Would not qualify for any clinical trial  Plan: Continue Leqvio  184 mg every 6 months, pravastatin  40 mg daily, ezetimibe  5 mg daily    Thank you,  Yordan Martindale D Waqas Bruhl, Pharm.JONETTA SARAN, CPP [facility] of [facility] [place] [address], [place], [place] [address]  Phone: [phone]; Fax: [phone]

## 2024-09-21 NOTE — Assessment & Plan Note (Signed)
 Assessment: LDL-C is well-controlled at 41 LP(a) increased from 111 to 159 Unfortunately no targeted treatment for LP(a) available Would not qualify for any clinical trial  Plan: Continue Leqvio  184 mg every 6 months, pravastatin  40 mg daily, ezetimibe  5 mg daily

## 2024-10-05 ENCOUNTER — Ambulatory Visit: Admitting: Cardiovascular Disease

## 2024-10-08 ENCOUNTER — Other Ambulatory Visit: Payer: Self-pay

## 2024-10-08 ENCOUNTER — Encounter: Payer: Self-pay | Admitting: Emergency Medicine

## 2024-10-08 ENCOUNTER — Ambulatory Visit
Admission: EM | Admit: 2024-10-08 | Discharge: 2024-10-08 | Disposition: A | Discharge status: Home / Self Care | Attending: Emergency Medicine | Admitting: Emergency Medicine

## 2024-10-08 DIAGNOSIS — J340 Abscess, furuncle and carbuncle of nose: Secondary | ICD-10-CM

## 2024-10-08 MED ORDER — DOXYCYCLINE HYCLATE 100 MG PO TABS: 100.0000 mg | ORAL_TABLET | Freq: Two times a day (BID) | ORAL | 0 refills | Status: AC

## 2024-10-08 NOTE — Discharge Instructions (Addendum)
 Take the doxycycline  twice daily with food for the next 7 days to treat the infection of your nose.  Continue to use the topical mupirocin ointment twice daily as well.  I would like for this to be cleared up before you have your nasal surgery.    If you do not have any improvement or if you develop any new concerning symptoms follow-up with your primary care provider or return to clinic for reevaluation.

## 2024-10-08 NOTE — ED Provider Notes (Signed)
 [facility]    CSN: [id number] Arrival date & time: 10/08/24  1215      History   Chief Complaint Chief Complaint  Patient presents with   Facial Swelling    HPI [name] [name] is a 73 y.o. male.   Patient presents to clinic over concern of infection to the inside of his nose, along the tip of the nasal vestibule on the right side.  Has noticed an open sore for around the past week or so, endorses that this may have started from where he plucked some of his nasal hairs.  Will have crusty drainage or purulent drainage from the site sometimes.  Does do daily saline nasal rinses and has been using topical mupirocin ointment twice daily.  The area is tender, has grown warm and swollen and feels like it is radiating over to the other side of his nose as well as the side of his cheek.  Afebrile.  Does have upcoming surgery for deviated septum.  The history is provided by the patient and medical records.    Past Medical History:  Diagnosis Date   Allergy    Anxiety    Arthritis    back pain   Avascular necrosis of bone of hip (HCC)    right   CAD (coronary artery disease)    stents x3 2008   GERD (gastroesophageal reflux disease)    History of nuclear stress test    a. Myoview  10/17: EF 56%, diaphragmatic attenuation, no ischemia, low risk   HTN (hypertension)    Hyperlipidemia    Myocardial infarction (HCC)    2008- stents x 3    Paresthesias    Sleep apnea    no cpap    Thyroid  disease     Patient Active Problem List   Diagnosis Date Noted   Localized cutaneous nodular amyloidosis (HCC) 12/04/2023   Mass of right wrist 11/21/2023   Benign lipomatous neoplasm of skin and subcutaneous tissue of left leg 07/25/2023   Peripheral arterial disease 05/01/2023   Hymenoptera allergy 10/23/2022   Rash and other nonspecific skin eruption 10/23/2022   Elevated blood pressure reading 10/23/2022   Anxiety 02/27/2022   Back pain 02/27/2022   Lipoma of right  upper extremity 02/27/2022   Benign prostatic hyperplasia with lower urinary tract symptoms 02/27/2022   Generalized anxiety disorder 02/27/2022   Hypertensive retinopathy, bilateral 02/27/2022   Impacted cerumen, bilateral 02/27/2022   Insomnia, unspecified 02/27/2022   Male erectile dysfunction, unspecified 02/27/2022   Mixed conductive and sensorineural hearing loss, unilateral, left ear with restricted hearing on the contralateral side 02/27/2022   Myopathy, unspecified 02/27/2022   Obstructive sleep apnea (adult) (pediatric) 02/27/2022   Radiculopathy due to cervical spondylosis 02/27/2022   Rhinitis 02/27/2022   Unspecified cataract 02/27/2022   Aftercare following surgery 01/23/2021   S/P cervical spinal fusion 01/23/2021   Abnormality of esophagus 01/27/2020   Colon cancer screening 01/27/2020   Trigger index finger of left hand 01/06/2020   Acquired trigger finger of left index finger 05/26/2019   Esophageal dysphagia 02/08/2016   Hemorrhoids 02/08/2016   Chronic radicular cervical pain 07/29/2014   GERD (gastroesophageal reflux disease) 10/14/2012   Constipation 10/14/2012   Stiffness of joint, not elsewhere classified, other specified site 02/21/2012   PUD (peptic ulcer disease) 12/21/2011   Dysphagia 07/25/2011   Hyperlipidemia 08/12/2009   ESSENTIAL HYPERTENSION, BENIGN 08/12/2009   CAD, NATIVE VESSEL 08/12/2009   Hypothyroidism 06/11/2009   Dermatitis due to drug taken internally  01/09/2008   LOCALIZED SUPERFICIAL SWELLING MASS OR LUMP 01/09/2008    Past Surgical History:  Procedure Laterality Date   CARDIAC CATHETERIZATION     3 stents placed   COLONOSCOPY  2011 NUR [facility] SCREENING d50 v5   MILD [place] TICS, ? PREP ARTIFACT V. PROCTITIS-rX:[place]. next TCS 2021.   ESOPHAGEAL DILATION N/A 03/02/2016   Procedure: ESOPHAGEAL DILATION;  Surgeon: Margo LITTIE Haddock, MD;  [facility];  Service: Endoscopy;  Laterality: N/A;   ESOPHAGOGASTRODUODENOSCOPY  10/03/11    small hiatal hernia/gastric ulcers/stricutre in distal esophagus   ESOPHAGOGASTRODUODENOSCOPY N/A 03/02/2016   Dr. Haddock: Schatzki's ring at GE junction s/p dilation, mild non-erosive gastritis/duodenitis    ESOPHAGOGASTRODUODENOSCOPY (EGD) WITH PROPOFOL  N/A 02/09/2020   Dr. Haddock: Low-grade narrowing Schatzki ring status post dilation, moderate gastritis, no biopsies taken   MASS EXCISION Left 09/30/2014   Procedure: EXCISION MASS LEFT WRIST ;  Surgeon: Arley Curia, MD;  Location: [facility];  Service: Orthopedics;  Laterality: Left;   ROTATOR CUFF REPAIR Left    [facility]    SAVORY DILATION N/A 02/09/2020   Procedure: SAVORY DILATION;  Surgeon: Haddock Margo LITTIE, MD;  [facility]: [facility];  Service: Endoscopy;  Laterality: N/A;   TONSILLECTOMY  age 43   TOTAL HIP ARTHROPLASTY  age 48   right hip for avascular necrosis of hip and spine   TRIGGER FINGER RELEASE Left 03/08/2020   Procedure: RELEASE TRIGGER FINGER/A-1 PULLEY LEFT INDEX FINGER;  Surgeon: Curia Arley, MD;  Location: [facility];  Service: Orthopedics;  Laterality: Left;  IV REGIONAL FOREARM BLOCK   UPPER GASTROINTESTINAL ENDOSCOPY     with dilation - last 02-2020        Home Medications    Prior to Admission medications   Medication Sig Start Date End Date Taking? Authorizing Provider  doxycycline  (VIBRA -TABS) 100 MG tablet Take 1 tablet (100 mg total) by mouth 2 (two) times daily for 7 days. 10/08/24 10/15/24 Yes Artia Singley  N, FNP  acetaminophen  (TYLENOL ) 325 MG tablet Take 650 mg by mouth daily as needed (pain).    [provider]  aspirin  EC 81 MG tablet Take 1 tablet (81 mg total) by mouth daily. 01/24/18   Okey Vina GAILS, MD  calcium  carbonate (TUMS EX) 750 MG chewable tablet Chew 1 tablet by mouth 2 (two) times daily as needed for heartburn.     [provider]  carvedilol  (COREG ) 6.25 MG tablet Take 0.5 tablets by mouth in the morning and at bedtime. 08/17/21   [provider]  desloratadine (CLARINEX) 5 MG tablet Take 5 mg by mouth daily. 12/03/22   [provider]  EPINEPHrine  0.3 mg/0.3 mL IJ SOAJ injection Inject 0.3 mg into the muscle as needed for anaphylaxis. 03/23/21   Pollina, Christopher J, MD  ezetimibe  (ZETIA ) 10 MG tablet Take 0.5 tablets (5 mg total) by mouth daily. 04/03/22   Ross, Paula V, MD  hydrochlorothiazide (HYDRODIURIL) 25 MG tablet Take 12.5 mg by mouth 2 (two) times daily.    [provider]  inclisiran (LEQVIO ) 284 MG/1.5ML SOSY injection Inject 1.5 mLs (284 mg total) into the skin once for 1 dose. 10/30/23   Okey Vina GAILS, MD  levothyroxine (SYNTHROID, LEVOTHROID) 75 MCG tablet Take 75 mcg by mouth daily before breakfast.    [provider]  losartan  (COZAAR ) 100 MG tablet Take 50 mg by mouth 2 (two) times daily. 07/09/19   [provider]  omeprazole  (PRILOSEC) 20  MG capsule Take 20 mg by mouth daily. 10/13/22   [provider]  pravastatin  (PRAVACHOL ) 40 MG tablet Take 20 mg by mouth 2 (two) times daily.    [provider]  pregabalin (LYRICA) 25 MG capsule Take 25 mg by mouth daily. 04/01/23   [provider]  psyllium (METAMUCIL) 58.6 % powder Take 1 packet by mouth daily.    [provider]  triamcinolone  cream (KENALOG ) 0.1 % Apply 1 application topically 2 (two) times daily as needed (as directed).    [provider]  metoprolol  tartrate (LOPRESSOR ) 25 MG tablet Take 1 tablet (25 mg total) by mouth 2 (two) times daily as needed (palpitations). 03/28/21 03/28/21  Haze Lonni PARAS, MD    Family History Family History  Problem Relation Age of Onset   Dementia Mother    Prostate cancer Father 33   Lung cancer Sister 76   Colon cancer Neg Hx    Colon polyps Neg Hx    Anesthesia problems Neg Hx    Hypotension Neg Hx    Malignant hyperthermia Neg Hx    Pseudochol deficiency Neg Hx    Esophageal cancer Neg Hx    Stomach cancer Neg Hx    Rectal  cancer Neg Hx    Pancreatic cancer Neg Hx     Social History Social History   Tobacco Use   Smoking status: Former    Current packs/day: 0.00    Average packs/day: 1 pack/day for 30.0 years (30.0 ttl pk-yrs)    Types: Cigarettes    Start date: 09/29/1977    Quit date: 09/30/2007    Years since quitting: 17.0   Smokeless tobacco: Never  Vaping Use   Vaping status: Never Used  Substance Use Topics   Alcohol use: No   Drug use: No     Allergies   Atorvastatin , Bee venom, Clopidogrel bisulfate, Rosuvastatin , Nexletol  [bempedoic acid ], Penicillins, Aurothiomalic acid disodium [gold sodium thiomalate], Contrast media [iodinated contrast media], Flomax [tamsulosin], Gadolinium, Neomycin, Viagra [sildenafil], Clopidogrel, Omeprazole , Repatha  [evolocumab ], and Tape   Review of Systems Review of Systems  Per HPI  Physical Exam Triage Vital Signs ED Triage Vitals  Encounter Vitals Group     BP 10/08/24 1239 (!) 148/79     Girls Systolic BP Percentile --      Girls Diastolic BP Percentile --      Boys Systolic BP Percentile --      Boys Diastolic BP Percentile --      Pulse Rate 10/08/24 1239 73     Resp 10/08/24 1239 18     Temp 10/08/24 1239 98.2 F (36.8 C)     Temp Source 10/08/24 1239 Oral     SpO2 10/08/24 1239 95 %     Weight --      Height --      Head Circumference --      Peak Flow --      Pain Score 10/08/24 1237 4     Pain Loc --      Pain Education --      Exclude from Growth Chart --    No data found.  Updated Vital Signs BP (!) 148/79 (BP Location: Right Arm) Comment: took bp med prior to UC arrival.  Pulse 73   Temp 98.2 F (36.8 C) (Oral)   Resp 18   SpO2 95%   Visual Acuity Right Eye Distance:   Left Eye Distance:   Bilateral Distance:    Right Eye Near:  Left Eye Near:    Bilateral Near:     Physical Exam Vitals and nursing note reviewed.  Constitutional:      Appearance: Normal appearance.  HENT:     Head: Normocephalic and  atraumatic.     Right Ear: External ear normal.     Left Ear: External ear normal.     Nose:      Mouth/Throat:     Mouth: Mucous membranes are moist.  Eyes:     Conjunctiva/sclera: Conjunctivae normal.  Cardiovascular:     Rate and Rhythm: Normal rate.  Pulmonary:     Effort: Pulmonary effort is normal. No respiratory distress.  Musculoskeletal:        General: Normal range of motion.  Skin:    General: Skin is warm and dry.  Neurological:     General: No focal deficit present.     Mental Status: He is alert and oriented to person, place, and time.  Psychiatric:        Behavior: Behavior normal. Behavior is cooperative.      UC Treatments / Results  Labs (all labs ordered are listed, but only abnormal results are displayed) Labs Reviewed - No data to display  EKG   Radiology No results found.  Procedures Procedures (including critical care time)  Medications Ordered in UC Medications - No data to display  Initial Impression / Assessment and Plan / UC Course  I have reviewed the triage vital signs and the nursing notes.  Pertinent labs & imaging results that were available during my care of the patient were reviewed by me and considered in my medical decision making (see chart for details).  Vitals and triage reviewed, patient is hemodynamically stable.  Appears to have cellulitis of the tip of the nasal vestibule over right side, spreading to the left.  Will cover with doxycycline  due to penicillin allergy.  Encouraged continuation of mupirocin ointment for topical MRSA properties.  Without signs or symptoms of systemic infection, afebrile without tachycardia.  Plan of care, follow-up care return precautions given, no questions at this time.     Final Clinical Impressions(s) / UC Diagnoses   Final diagnoses:  Cellulitis of nose, external     Discharge Instructions      Take the doxycycline  twice daily with food for the next 7 days to treat the  infection of your nose.  Continue to use the topical mupirocin ointment twice daily as well.  I would like for this to be cleared up before you have your nasal surgery.    If you do not have any improvement or if you develop any new concerning symptoms follow-up with your primary care provider or return to clinic for reevaluation.    ED Prescriptions     Medication Sig Dispense Auth. Provider   doxycycline  (VIBRA -TABS) 100 MG tablet Take 1 tablet (100 mg total) by mouth 2 (two) times daily for 7 days. 14 tablet Dreama, Natlie Asfour  N, FNP      PDMP not reviewed this encounter.   Dreama, Ahmaud Duthie  N, FNP 10/08/24 1312

## 2024-10-08 NOTE — ED Triage Notes (Addendum)
 Pt reports possible nasal infection for last week.states was treated for similar x2 weeks ago. Reports has been using mupirocin ointment,salt rubs, and nasal rinses with no change in nasal pain/swollen. Denies any known fevers.  Pt states has surgery scheduled for deviated septum in a few weeks as well.

## 2024-10-12 ENCOUNTER — Ambulatory Visit: Attending: Cardiovascular Disease | Admitting: Cardiovascular Disease

## 2024-10-12 ENCOUNTER — Encounter: Payer: Self-pay | Admitting: Cardiovascular Disease

## 2024-10-12 VITALS — BP 126/72 | HR 72 | Ht 72.0 in | Wt 187.4 lb

## 2024-10-12 DIAGNOSIS — I739 Peripheral vascular disease, unspecified: Secondary | ICD-10-CM | POA: Insufficient documentation

## 2024-10-12 NOTE — Patient Instructions (Signed)
 Medication Instructions:  Your physician recommends that you continue on your current medications as directed. Please refer to the Current Medication list given to you today.  *If you need a refill on your cardiac medications before your next appointment, please call your pharmacy*  Testing/Procedures: Your physician has requested that you have a lower extremity arterial duplex. During this test, ultrasound is used to evaluate arterial blood flow in the legs. Allow one hour for this exam. There are no restrictions or special instructions. This will take place at [address], 4th floor  **To do in March**  Please note: We ask at that you not bring children with you during ultrasound (echo/ vascular) testing. Due to room size and safety concerns, children are not allowed in the ultrasound rooms during exams. Our front office staff cannot provide observation of children in our lobby area while testing is being conducted. An adult accompanying a patient to their appointment will only be allowed in the ultrasound room at the discretion of the ultrasound technician under special circumstances. We apologize for any inconvenience.  Your physician has requested that you have an ankle brachial index (ABI). During this test an ultrasound and blood pressure cuff are used to evaluate the arteries that supply the arms and legs with blood. Allow thirty minutes for this exam. There are no restrictions or special instructions. This will take place at [address] 4th floor   **To do in March**   Please note: We ask at that you not bring children with you during ultrasound (echo/ vascular) testing. Due to room size and safety concerns, children are not allowed in the ultrasound rooms during exams. Our front office staff cannot provide observation of children in our lobby area while testing is being conducted. An adult accompanying a patient to their appointment will only be allowed in the ultrasound room at the  discretion of the ultrasound technician under special circumstances. We apologize for any inconvenience.   Follow-Up: At [facility], you and your health needs are our priority.  As part of our continuing mission to provide you with exceptional heart care, our providers are all part of one team.  This team includes your primary Cardiologist (physician) and Advanced Practice Providers or APPs (Physician Assistants and Nurse Practitioners) who all work together to provide you with the care you need, when you need it.  Your next appointment:   12 month(s)  Provider:   Dorn Lesches, MD    We recommend signing up for the patient portal called MyChart.  Sign up information is provided on this After Visit Summary.  MyChart is used to connect with patients for Virtual Visits (Telemedicine).  Patients are able to view lab/test results, encounter notes, upcoming appointments, etc.  Non-urgent messages can be sent to your provider as well.   To learn more about what you can do with MyChart, go to [email or web address].

## 2024-10-12 NOTE — Progress Notes (Signed)
 10/12/2024 [name] [name]   [birth year]  [id number]  Primary Physician Marvine Rush, MD Primary Cardiologist: Dorn JINNY Lesches MD [place], [place]  HPI:  [name] [name] is a 73 y.o.  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 He was referred by Dr. Okey, his cardiologist, for evaluation of PAD.  I last saw him in the office 05/01/2023.  He did smoke remotely in 22,008 when he had a non-STEMI and bifurcation stenting of his LAD and circumflex. He apparently needed to undergo desensitization patient from Plavix because of allergic reaction. He is cardiovascular stable. He apparently started gabapentin which he had a reaction to and after that developed some pain in his legs. He since stopped the gabapentin. His symptoms do Staticin like claudication left greater than right although he had recent Doppler studies performed 03/12/2023 revealing essentially normal ABIs bilaterally with a mild blockage in his left popliteal artery.   Since I saw him a year ago he still complains of some left calf claudication when walking greater than a mile or up an incline.  This does not seem to have changed over the last year.  He otherwise denies chest pain or shortness of breath.  Current Meds  Medication Sig   acetaminophen  (TYLENOL ) 325 MG tablet Take 650 mg by mouth daily as needed (pain).   aspirin  EC 81 MG tablet Take 1 tablet (81 mg total) by mouth daily.   calcium  carbonate (TUMS EX) 750 MG chewable tablet Chew 1 tablet by mouth 2 (two) times daily as needed for heartburn.    carvedilol  (COREG ) 6.25 MG tablet Take 0.5 tablets by mouth in the morning and at bedtime.   desloratadine (CLARINEX) 5 MG tablet Take 5 mg by mouth daily.   doxycycline  (VIBRA -TABS) 100 MG tablet Take 1 tablet (100 mg total) by mouth 2 (two) times daily for 7 days.   EPINEPHrine  0.3 mg/0.3 mL IJ SOAJ injection  Inject 0.3 mg into the muscle as needed for anaphylaxis.   ezetimibe  (ZETIA ) 10 MG tablet Take 0.5 tablets (5 mg total) by mouth daily.   hydrochlorothiazide (HYDRODIURIL) 25 MG tablet Take 12.5 mg by mouth 2 (two) times daily.   inclisiran (LEQVIO ) 284 MG/1.5ML SOSY injection Inject 1.5 mLs (284 mg total) into the skin once for 1 dose.   levothyroxine (SYNTHROID, LEVOTHROID) 75 MCG tablet Take 75 mcg by mouth daily before breakfast.   losartan  (COZAAR ) 100 MG tablet Take 50 mg by mouth 2 (two) times daily.   omeprazole  (PRILOSEC) 20 MG capsule Take 20 mg by mouth daily.   pravastatin  (PRAVACHOL ) 40 MG tablet Take 20 mg by mouth 2 (two) times daily.   pregabalin (LYRICA) 25 MG capsule Take 25 mg by mouth daily.   psyllium (METAMUCIL) 58.6 % powder Take 1 packet by mouth daily.   triamcinolone  cream (KENALOG ) 0.1 % Apply 1 application topically 2 (two) times daily as needed (as directed).     Allergies  Allergen Reactions   Atorvastatin  Other (See Comments)    Muscle aches   Bee Venom Anaphylaxis, Hives and Swelling   Clopidogrel Bisulfate Hives   Rosuvastatin  Other (See Comments)    Muscle aches   Nexletol  [Bempedoic Acid ]     URI    Penicillins     Pt unsure what type allergy/ was a child when had reaction Did it involve swelling of the face/tongue/throat, SOB, or  low BP? Unknown Did it involve sudden or severe rash/hives, skin peeling, or any reaction on the inside of your mouth or nose? Unknown Did you need to seek medical attention at a hospital or doctor's office? Unknown When did it last happen?       If all above answers are "NO", may proceed with cephalosporin use.    Aurothiomalic Acid Disodium [Gold Sodium Thiomalate]    Contrast Media [Iodinated Contrast Media] Hives   Flomax [Tamsulosin]    Gadolinium    Neomycin    Viagra [Sildenafil]    Clopidogrel Rash and Hives   Omeprazole  Palpitations   Repatha  [Evolocumab ] Anxiety    Trouble sleeping   Tape Rash    Tears  skin Also ,EKG adhesive pads causes redness, rash and burning sensation at site     Social History   Socioeconomic History   Marital status: Single    Spouse name: Not on file   Number of children: Not on file   Years of education: Not on file   Highest education level: Not on file  Occupational History   Not on file  Tobacco Use   Smoking status: Former    Current packs/day: 0.00    Average packs/day: 1 pack/day for 30.0 years (30.0 ttl pk-yrs)    Types: Cigarettes    Start date: 09/29/1977    Quit date: 09/30/2007    Years since quitting: 17.0   Smokeless tobacco: Never  Vaping Use   Vaping status: Never Used  Substance and Sexual Activity   Alcohol use: No   Drug use: No   Sexual activity: Yes  Other Topics Concern   Not on file  Social History Narrative   Not on file   Social Drivers of Health   Financial Resource Strain: Low Risk  (11/27/2023)   Received from [facility]   Overall Financial Resource Strain (CARDIA)    Difficulty of Paying Living Expenses: Not hard at all  Food Insecurity: No Food Insecurity (11/27/2023)   Received from [facility]   Hunger Vital Sign    Within the past 12 months, you worried that your food would run out before you got the money to buy more.: Never true    Within the past 12 months, the food you bought just didn't last and you didn't have money to get more.: Never true  Transportation Needs: No Transportation Needs (11/27/2023)   [facility] from [facility] - Transportation    Lack of Transportation (Medical): No    Lack of Transportation (Non-Medical): No  Physical Activity: Not on file  Stress: Not on file  Social Connections: Unknown (05/11/2022)   Received from [facility]   Social Network    Social Network: Not on file  Intimate Partner Violence: Unknown (04/19/2022)   Received from [facility]   HITS    Physically Hurt: Not on file    Insult or Talk Down To: Not on file    Threaten Physical Harm:  Not on file    Scream or Curse: Not on file     Review of Systems: General: negative for chills, fever, night sweats or weight changes.  Cardiovascular: negative for chest pain, dyspnea on exertion, edema, orthopnea, palpitations, paroxysmal nocturnal dyspnea or shortness of breath Dermatological: negative for rash Respiratory: negative for cough or wheezing Urologic: negative for hematuria Abdominal: negative for nausea, vomiting, diarrhea, bright red blood per rectum, melena, or hematemesis Neurologic: negative for visual changes, syncope, or dizziness All other  systems reviewed and are otherwise negative except as noted above.    Blood pressure 126/72, pulse 72, height 6' (1.829 m), weight 187 lb 6.4 oz (85 kg), SpO2 94%.  General appearance: alert and no distress Neck: no adenopathy, no carotid bruit, no JVD, supple, symmetrical, trachea midline, and thyroid  not enlarged, symmetric, no tenderness/mass/nodules Lungs: clear to auscultation bilaterally Heart: regular rate and rhythm, S1, S2 normal, no murmur, click, rub or gallop Extremities: extremities normal, atraumatic, no cyanosis or edema Pulses: 2+ and symmetric Skin: Skin color, texture, turgor normal. No rashes or lesions Neurologic: Grossly normal  EKG not performed today      ASSESSMENT AND PLAN:   Peripheral arterial disease History of PAD with lifestyle-limiting left calf claudication with significant ambulation of greater than 1 mile.  He did have Doppler studies performed 03/05/2024 revealing a right ABI of 1.08 and a left of 0.97 with mild stenosis in the left popliteal artery distally.  At this point he does not wish to have any other procedures done nor does he want to be put on any medications.  Will repeat his Dopplers in March of next year and I will see him back in 1 year for follow-up.     Dorn DOROTHA Lesches MD FACP,FACC,FAHA, [facility] 10/12/2024 1:43 PM

## 2024-10-12 NOTE — Assessment & Plan Note (Signed)
 History of PAD with lifestyle-limiting left calf claudication with significant ambulation of greater than 1 mile.  He did have Doppler studies performed 03/05/2024 revealing a right ABI of 1.08 and a left of 0.97 with mild stenosis in the left popliteal artery distally.  At this point he does not wish to have any other procedures done nor does he want to be put on any medications.  Will repeat his Dopplers in March of next year and I will see him back in 1 year for follow-up.

## 2025-01-12 ENCOUNTER — Telehealth: Payer: Self-pay

## 2025-01-12 NOTE — Telephone Encounter (Signed)
 Auth Submission: NO AUTH NEEDED Site of care: Site of care: [facility] Payer: Medicare A/B with [employer] FEP Medication & CPT/J Code(s) submitted: Leqvio  (Inclisiran) J1306 Diagnosis Code:  Route of submission (phone, fax, portal):  Phone # Fax # Auth type: Buy/Bill PB Units/visits requested: 284mg  x 2 doses Reference number:  Approval from: 01/12/25 to 01/30/26

## 2025-02-26 ENCOUNTER — Ambulatory Visit

## 2025-03-01 ENCOUNTER — Ambulatory Visit (HOSPITAL_COMMUNITY)
